# Patient Record
Sex: Male | Born: 1955
Health system: Southern US, Community
[De-identification: ages and names within clinical notes are randomized; demographics above are authoritative.]

## PROBLEM LIST (undated history)

## (undated) DIAGNOSIS — I671 Cerebral aneurysm, nonruptured: Secondary | ICD-10-CM

## (undated) DIAGNOSIS — G510 Bell's palsy: Secondary | ICD-10-CM

## (undated) DIAGNOSIS — M25519 Pain in unspecified shoulder: Secondary | ICD-10-CM

## (undated) DIAGNOSIS — I499 Cardiac arrhythmia, unspecified: Secondary | ICD-10-CM

## (undated) DIAGNOSIS — T4145XA Adverse effect of unspecified anesthetic, initial encounter: Secondary | ICD-10-CM

## (undated) DIAGNOSIS — K5900 Constipation, unspecified: Secondary | ICD-10-CM

## (undated) DIAGNOSIS — G4730 Sleep apnea, unspecified: Secondary | ICD-10-CM

## (undated) DIAGNOSIS — I10 Essential (primary) hypertension: Secondary | ICD-10-CM

## (undated) DIAGNOSIS — M1990 Unspecified osteoarthritis, unspecified site: Secondary | ICD-10-CM

## (undated) DIAGNOSIS — Z950 Presence of cardiac pacemaker: Secondary | ICD-10-CM

## (undated) DIAGNOSIS — R001 Bradycardia, unspecified: Secondary | ICD-10-CM

## (undated) DIAGNOSIS — F419 Anxiety disorder, unspecified: Secondary | ICD-10-CM

## (undated) HISTORY — PX: FRACTURE SURGERY: SHX138

## (undated) HISTORY — PX: KNEE SURGERY: SHX244

## (undated) HISTORY — DX: Essential (primary) hypertension: I10

## (undated) HISTORY — PX: OTHER SURGICAL HISTORY: SHX169

---

## 1998-06-23 ENCOUNTER — Emergency Department (HOSPITAL_COMMUNITY): Admission: EM | Admit: 1998-06-23 | Discharge: 1998-06-24 | Payer: Self-pay

## 1999-03-28 ENCOUNTER — Encounter: Payer: Self-pay | Admitting: Emergency Medicine

## 1999-03-28 ENCOUNTER — Emergency Department (HOSPITAL_COMMUNITY): Admission: EM | Admit: 1999-03-28 | Discharge: 1999-03-28 | Payer: Self-pay | Admitting: Emergency Medicine

## 1999-03-28 ENCOUNTER — Emergency Department (HOSPITAL_COMMUNITY): Admission: EM | Admit: 1999-03-28 | Discharge: 1999-03-29 | Payer: Self-pay | Admitting: Emergency Medicine

## 1999-05-06 ENCOUNTER — Encounter (HOSPITAL_COMMUNITY): Admission: RE | Admit: 1999-05-06 | Discharge: 1999-08-04 | Payer: Self-pay | Admitting: Specialist

## 1999-09-08 ENCOUNTER — Emergency Department (HOSPITAL_COMMUNITY): Admission: EM | Admit: 1999-09-08 | Discharge: 1999-09-08 | Payer: Self-pay | Admitting: Emergency Medicine

## 2000-12-27 ENCOUNTER — Emergency Department (HOSPITAL_COMMUNITY): Admission: EM | Admit: 2000-12-27 | Discharge: 2000-12-27 | Payer: Self-pay | Admitting: Emergency Medicine

## 2000-12-27 ENCOUNTER — Encounter: Payer: Self-pay | Admitting: Emergency Medicine

## 2001-05-06 ENCOUNTER — Inpatient Hospital Stay (HOSPITAL_COMMUNITY): Admission: EM | Admit: 2001-05-06 | Discharge: 2001-05-07 | Payer: Self-pay | Admitting: Emergency Medicine

## 2003-05-04 ENCOUNTER — Emergency Department (HOSPITAL_COMMUNITY): Admission: EM | Admit: 2003-05-04 | Discharge: 2003-05-04 | Payer: Self-pay | Admitting: Emergency Medicine

## 2004-03-15 ENCOUNTER — Emergency Department (HOSPITAL_COMMUNITY): Admission: EM | Admit: 2004-03-15 | Discharge: 2004-03-15 | Payer: Self-pay | Admitting: Emergency Medicine

## 2004-07-20 ENCOUNTER — Emergency Department (HOSPITAL_COMMUNITY): Admission: EM | Admit: 2004-07-20 | Discharge: 2004-07-20 | Payer: Self-pay | Admitting: Emergency Medicine

## 2004-10-08 ENCOUNTER — Ambulatory Visit: Payer: Self-pay | Admitting: Family Medicine

## 2004-10-10 ENCOUNTER — Emergency Department (HOSPITAL_COMMUNITY): Admission: EM | Admit: 2004-10-10 | Discharge: 2004-10-11 | Payer: Self-pay | Admitting: Emergency Medicine

## 2005-04-17 ENCOUNTER — Encounter: Admission: RE | Admit: 2005-04-17 | Discharge: 2005-04-17 | Payer: Self-pay | Admitting: Family Medicine

## 2005-04-18 ENCOUNTER — Encounter: Admission: RE | Admit: 2005-04-18 | Discharge: 2005-04-18 | Payer: Self-pay | Admitting: Family Medicine

## 2005-04-25 ENCOUNTER — Ambulatory Visit (HOSPITAL_COMMUNITY): Admission: RE | Admit: 2005-04-25 | Discharge: 2005-04-25 | Payer: Self-pay | Admitting: Orthopedic Surgery

## 2005-04-25 ENCOUNTER — Encounter (INDEPENDENT_AMBULATORY_CARE_PROVIDER_SITE_OTHER): Payer: Self-pay | Admitting: *Deleted

## 2005-11-13 ENCOUNTER — Ambulatory Visit: Payer: Self-pay | Admitting: Internal Medicine

## 2005-11-13 ENCOUNTER — Inpatient Hospital Stay (HOSPITAL_COMMUNITY): Admission: EM | Admit: 2005-11-13 | Discharge: 2005-11-14 | Payer: Self-pay | Admitting: Emergency Medicine

## 2005-11-20 ENCOUNTER — Ambulatory Visit: Payer: Self-pay | Admitting: Internal Medicine

## 2005-11-22 ENCOUNTER — Ambulatory Visit: Payer: Self-pay | Admitting: Internal Medicine

## 2005-12-22 ENCOUNTER — Ambulatory Visit: Payer: Self-pay | Admitting: Internal Medicine

## 2006-01-08 ENCOUNTER — Ambulatory Visit: Payer: Self-pay | Admitting: Internal Medicine

## 2006-01-30 ENCOUNTER — Ambulatory Visit: Payer: Self-pay | Admitting: Internal Medicine

## 2006-02-08 ENCOUNTER — Ambulatory Visit: Payer: Self-pay | Admitting: Cardiology

## 2006-02-08 ENCOUNTER — Ambulatory Visit: Payer: Self-pay | Admitting: Gastroenterology

## 2006-02-13 ENCOUNTER — Ambulatory Visit: Payer: Self-pay | Admitting: Gastroenterology

## 2006-03-01 ENCOUNTER — Ambulatory Visit: Payer: Self-pay | Admitting: Gastroenterology

## 2006-03-02 ENCOUNTER — Ambulatory Visit: Payer: Self-pay | Admitting: Gastroenterology

## 2006-04-23 ENCOUNTER — Ambulatory Visit: Payer: Self-pay | Admitting: Internal Medicine

## 2006-07-14 ENCOUNTER — Ambulatory Visit: Payer: Self-pay | Admitting: Internal Medicine

## 2006-07-17 ENCOUNTER — Ambulatory Visit: Payer: Self-pay | Admitting: Internal Medicine

## 2007-01-30 ENCOUNTER — Ambulatory Visit: Payer: Self-pay | Admitting: Internal Medicine

## 2007-01-30 LAB — CONVERTED CEMR LAB
ALT: 24 units/L (ref 0–40)
AST: 21 units/L (ref 0–37)
Albumin: 3.9 g/dL (ref 3.5–5.2)
Alkaline Phosphatase: 34 units/L — ABNORMAL LOW (ref 39–117)
BUN: 13 mg/dL (ref 6–23)
Basophils Absolute: 0.1 10*3/uL (ref 0.0–0.1)
Basophils Relative: 0.7 % (ref 0.0–1.0)
Bilirubin, Direct: 0.1 mg/dL (ref 0.0–0.3)
CO2: 26 meq/L (ref 19–32)
Calcium: 9.6 mg/dL (ref 8.4–10.5)
Chloride: 106 meq/L (ref 96–112)
Cholesterol: 156 mg/dL (ref 0–200)
Creatinine, Ser: 0.9 mg/dL (ref 0.4–1.5)
Eosinophils Absolute: 0.2 10*3/uL (ref 0.0–0.6)
Eosinophils Relative: 2.4 % (ref 0.0–5.0)
GFR calc Af Amer: 114 mL/min
GFR calc non Af Amer: 95 mL/min
Glucose, Bld: 87 mg/dL (ref 70–99)
HCT: 44.8 % (ref 39.0–52.0)
HDL: 34.2 mg/dL — ABNORMAL LOW (ref >39.0)
Hemoglobin: 16 g/dL (ref 13.0–17.0)
LDL Cholesterol: 106 mg/dL — ABNORMAL HIGH (ref 0–99)
Lymphocytes Relative: 35 % (ref 12.0–46.0)
MCHC: 35.8 g/dL (ref 30.0–36.0)
MCV: 89.3 fL (ref 78.0–100.0)
Monocytes Absolute: 0.5 10*3/uL (ref 0.2–0.7)
Monocytes Relative: 6.7 % (ref 3.0–11.0)
Neutro Abs: 4.2 10*3/uL (ref 1.4–7.7)
Neutrophils Relative %: 55.2 % (ref 43.0–77.0)
PSA: 0.55 ng/mL (ref 0.10–4.00)
Platelets: 319 10*3/uL (ref 150–400)
Potassium: 3.5 meq/L (ref 3.5–5.1)
RBC: 5.02 M/uL (ref 4.22–5.81)
RDW: 12.4 % (ref 11.5–14.6)
Sodium: 139 meq/L (ref 135–145)
Total Bilirubin: 0.8 mg/dL (ref 0.3–1.2)
Total CHOL/HDL Ratio: 4.6
Total Protein: 7.4 g/dL (ref 6.0–8.3)
Triglycerides: 78 mg/dL (ref 0–149)
VLDL: 16 mg/dL (ref 0–40)
WBC: 7.7 10*3/uL (ref 4.5–10.5)

## 2007-02-11 ENCOUNTER — Ambulatory Visit: Payer: Self-pay | Admitting: Gastroenterology

## 2007-02-25 ENCOUNTER — Encounter: Payer: Self-pay | Admitting: Gastroenterology

## 2007-02-25 ENCOUNTER — Ambulatory Visit: Payer: Self-pay | Admitting: Gastroenterology

## 2007-09-26 ENCOUNTER — Telehealth: Payer: Self-pay | Admitting: Internal Medicine

## 2007-12-09 ENCOUNTER — Telehealth: Payer: Self-pay | Admitting: Internal Medicine

## 2007-12-10 ENCOUNTER — Ambulatory Visit: Payer: Self-pay | Admitting: Internal Medicine

## 2007-12-10 DIAGNOSIS — I152 Hypertension secondary to endocrine disorders: Secondary | ICD-10-CM | POA: Insufficient documentation

## 2007-12-10 DIAGNOSIS — E119 Type 2 diabetes mellitus without complications: Secondary | ICD-10-CM | POA: Insufficient documentation

## 2007-12-10 DIAGNOSIS — I10 Essential (primary) hypertension: Secondary | ICD-10-CM

## 2007-12-10 HISTORY — DX: Essential (primary) hypertension: I10

## 2007-12-10 LAB — CONVERTED CEMR LAB
BUN: 12 mg/dL (ref 6–23)
Basophils Absolute: 0 10*3/uL (ref 0.0–0.1)
Basophils Relative: 0.3 % (ref 0.0–1.0)
CO2: 23 meq/L (ref 19–32)
Calcium: 10.2 mg/dL (ref 8.4–10.5)
Chloride: 104 meq/L (ref 96–112)
Creatinine, Ser: 0.9 mg/dL (ref 0.4–1.5)
Eosinophils Absolute: 0.3 10*3/uL (ref 0.0–0.6)
Eosinophils Relative: 3.2 % (ref 0.0–5.0)
GFR calc Af Amer: 114 mL/min
GFR calc non Af Amer: 94 mL/min
Glucose, Bld: 117 mg/dL — ABNORMAL HIGH (ref 70–99)
HCT: 47.8 % (ref 39.0–52.0)
Hemoglobin: 16.2 g/dL (ref 13.0–17.0)
Lymphocytes Relative: 32 % (ref 12.0–46.0)
MCHC: 33.9 g/dL (ref 30.0–36.0)
MCV: 91.7 fL (ref 78.0–100.0)
Monocytes Absolute: 0.8 10*3/uL — ABNORMAL HIGH (ref 0.2–0.7)
Monocytes Relative: 8.6 % (ref 3.0–11.0)
Neutro Abs: 5.2 10*3/uL (ref 1.4–7.7)
Neutrophils Relative %: 55.9 % (ref 43.0–77.0)
Platelets: 393 10*3/uL (ref 150–400)
Potassium: 4.5 meq/L (ref 3.5–5.1)
RBC: 5.22 M/uL (ref 4.22–5.81)
RDW: 12.7 % (ref 11.5–14.6)
Sodium: 139 meq/L (ref 135–145)
TSH: 0.98 microintl units/mL (ref 0.35–5.50)
WBC: 9.2 10*3/uL (ref 4.5–10.5)

## 2007-12-11 ENCOUNTER — Telehealth (INDEPENDENT_AMBULATORY_CARE_PROVIDER_SITE_OTHER): Payer: Self-pay | Admitting: *Deleted

## 2007-12-12 ENCOUNTER — Ambulatory Visit: Payer: Self-pay

## 2007-12-13 ENCOUNTER — Ambulatory Visit: Payer: Self-pay

## 2007-12-13 ENCOUNTER — Ambulatory Visit: Payer: Self-pay | Admitting: Cardiology

## 2007-12-13 ENCOUNTER — Encounter: Payer: Self-pay | Admitting: Internal Medicine

## 2007-12-16 ENCOUNTER — Telehealth: Payer: Self-pay | Admitting: Internal Medicine

## 2008-04-14 ENCOUNTER — Ambulatory Visit: Payer: Self-pay | Admitting: Internal Medicine

## 2008-04-16 ENCOUNTER — Telehealth: Payer: Self-pay | Admitting: Internal Medicine

## 2008-06-29 ENCOUNTER — Emergency Department (HOSPITAL_COMMUNITY): Admission: EM | Admit: 2008-06-29 | Discharge: 2008-06-29 | Payer: Self-pay | Admitting: Emergency Medicine

## 2008-11-05 ENCOUNTER — Emergency Department (HOSPITAL_COMMUNITY): Admission: EM | Admit: 2008-11-05 | Discharge: 2008-11-05 | Payer: Self-pay | Admitting: *Deleted

## 2008-11-05 LAB — CONVERTED CEMR LAB: Glucose, Bld: 342 mg/dL

## 2008-11-09 ENCOUNTER — Ambulatory Visit: Payer: Self-pay | Admitting: Internal Medicine

## 2008-11-09 DIAGNOSIS — E1165 Type 2 diabetes mellitus with hyperglycemia: Secondary | ICD-10-CM

## 2008-11-12 LAB — CONVERTED CEMR LAB
ALT: 183 units/L — ABNORMAL HIGH (ref 0–53)
AST: 160 units/L — ABNORMAL HIGH (ref 0–37)
BUN: 14 mg/dL (ref 6–23)
CO2: 24 meq/L (ref 19–32)
Calcium: 9.7 mg/dL (ref 8.4–10.5)
Chloride: 101 meq/L (ref 96–112)
Cholesterol: 186 mg/dL (ref 0–200)
Creatinine, Ser: 0.7 mg/dL (ref 0.4–1.5)
GFR calc Af Amer: 152 mL/min
GFR calc non Af Amer: 125 mL/min
Glucose, Bld: 241 mg/dL — ABNORMAL HIGH (ref 70–99)
HDL: 26 mg/dL — ABNORMAL LOW (ref >39.0)
Hgb A1c MFr Bld: 10.7 % — ABNORMAL HIGH (ref 4.6–6.0)
LDL Cholesterol: 133 mg/dL — ABNORMAL HIGH (ref 0–99)
Potassium: 3.6 meq/L (ref 3.5–5.1)
Sodium: 135 meq/L (ref 135–145)
Total CHOL/HDL Ratio: 7.2
Triglycerides: 134 mg/dL (ref 0–149)
VLDL: 27 mg/dL (ref 0–40)

## 2008-11-17 ENCOUNTER — Telehealth: Payer: Self-pay | Admitting: Internal Medicine

## 2008-12-21 ENCOUNTER — Ambulatory Visit: Payer: Self-pay | Admitting: Internal Medicine

## 2009-03-01 ENCOUNTER — Ambulatory Visit: Payer: Self-pay | Admitting: Internal Medicine

## 2009-03-03 ENCOUNTER — Telehealth: Payer: Self-pay | Admitting: Internal Medicine

## 2009-03-26 ENCOUNTER — Ambulatory Visit: Payer: Self-pay | Admitting: Internal Medicine

## 2009-03-26 LAB — CONVERTED CEMR LAB
ALT: 20 units/L (ref 0–53)
AST: 18 units/L (ref 0–37)
Albumin: 3.7 g/dL (ref 3.5–5.2)
Alkaline Phosphatase: 36 units/L — ABNORMAL LOW (ref 39–117)
BUN: 15 mg/dL (ref 6–23)
Bilirubin, Direct: 0 mg/dL (ref 0.0–0.3)
CO2: 30 meq/L (ref 19–32)
Calcium: 9.4 mg/dL (ref 8.4–10.5)
Chloride: 108 meq/L (ref 96–112)
Cholesterol: 147 mg/dL (ref 0–200)
Creatinine, Ser: 0.9 mg/dL (ref 0.4–1.5)
GFR calc non Af Amer: 93.47 mL/min (ref >60)
Glucose, Bld: 83 mg/dL (ref 70–99)
HDL: 28.6 mg/dL — ABNORMAL LOW (ref >39.00)
Hgb A1c MFr Bld: 6.2 % (ref 4.6–6.5)
LDL Cholesterol: 102 mg/dL — ABNORMAL HIGH (ref 0–99)
Potassium: 4.2 meq/L (ref 3.5–5.1)
Sodium: 143 meq/L (ref 135–145)
Total Bilirubin: 0.8 mg/dL (ref 0.3–1.2)
Total CHOL/HDL Ratio: 5
Total Protein: 7 g/dL (ref 6.0–8.3)
Triglycerides: 83 mg/dL (ref 0.0–149.0)
VLDL: 16.6 mg/dL (ref 0.0–40.0)

## 2009-04-02 ENCOUNTER — Ambulatory Visit: Payer: Self-pay | Admitting: Internal Medicine

## 2009-08-24 ENCOUNTER — Telehealth: Payer: Self-pay | Admitting: Internal Medicine

## 2009-09-06 ENCOUNTER — Telehealth: Payer: Self-pay | Admitting: Internal Medicine

## 2009-09-15 ENCOUNTER — Ambulatory Visit: Payer: Self-pay | Admitting: Internal Medicine

## 2009-09-15 LAB — CONVERTED CEMR LAB
ALT: 16 units/L (ref 0–53)
AST: 17 units/L (ref 0–37)
Albumin: 4 g/dL (ref 3.5–5.2)
Alkaline Phosphatase: 33 units/L — ABNORMAL LOW (ref 39–117)
BUN: 18 mg/dL (ref 6–23)
Bilirubin, Direct: 0 mg/dL (ref 0.0–0.3)
CO2: 30 meq/L (ref 19–32)
Calcium: 9.4 mg/dL (ref 8.4–10.5)
Chloride: 103 meq/L (ref 96–112)
Cholesterol: 156 mg/dL (ref 0–200)
Creatinine, Ser: 1.2 mg/dL (ref 0.4–1.5)
GFR calc non Af Amer: 66.95 mL/min (ref >60)
Glucose, Bld: 82 mg/dL (ref 70–99)
HDL: 25.7 mg/dL — ABNORMAL LOW (ref >39.00)
Hgb A1c MFr Bld: 6 % (ref 4.6–6.5)
LDL Cholesterol: 113 mg/dL — ABNORMAL HIGH (ref 0–99)
Potassium: 4.4 meq/L (ref 3.5–5.1)
Sodium: 142 meq/L (ref 135–145)
Total Bilirubin: 0.9 mg/dL (ref 0.3–1.2)
Total CHOL/HDL Ratio: 6
Total Protein: 7.1 g/dL (ref 6.0–8.3)
Triglycerides: 86 mg/dL (ref 0.0–149.0)
VLDL: 17.2 mg/dL (ref 0.0–40.0)

## 2009-09-24 ENCOUNTER — Ambulatory Visit: Payer: Self-pay | Admitting: Internal Medicine

## 2009-12-10 ENCOUNTER — Ambulatory Visit: Payer: Self-pay | Admitting: Internal Medicine

## 2010-03-07 ENCOUNTER — Ambulatory Visit: Payer: Self-pay | Admitting: Internal Medicine

## 2010-03-08 LAB — CONVERTED CEMR LAB
ALT: 21 units/L (ref 0–53)
AST: 23 units/L (ref 0–37)
Albumin: 4.3 g/dL (ref 3.5–5.2)
Alkaline Phosphatase: 35 units/L — ABNORMAL LOW (ref 39–117)
BUN: 17 mg/dL (ref 6–23)
Bilirubin, Direct: 0 mg/dL (ref 0.0–0.3)
CO2: 31 meq/L (ref 19–32)
Calcium: 10.2 mg/dL (ref 8.4–10.5)
Chloride: 105 meq/L (ref 96–112)
Cholesterol: 178 mg/dL (ref 0–200)
Creatinine, Ser: 1.2 mg/dL (ref 0.4–1.5)
GFR calc non Af Amer: 66.83 mL/min (ref >60)
Glucose, Bld: 96 mg/dL (ref 70–99)
HDL: 40.3 mg/dL (ref >39.00)
Hgb A1c MFr Bld: 6.2 % (ref 4.6–6.5)
LDL Cholesterol: 116 mg/dL — ABNORMAL HIGH (ref 0–99)
Potassium: 5 meq/L (ref 3.5–5.1)
Sodium: 145 meq/L (ref 135–145)
Total Bilirubin: 0.7 mg/dL (ref 0.3–1.2)
Total CHOL/HDL Ratio: 4
Total Protein: 7.8 g/dL (ref 6.0–8.3)
Triglycerides: 111 mg/dL (ref 0.0–149.0)
VLDL: 22.2 mg/dL (ref 0.0–40.0)

## 2010-03-18 ENCOUNTER — Ambulatory Visit: Payer: Self-pay | Admitting: Internal Medicine

## 2010-03-21 ENCOUNTER — Telehealth: Payer: Self-pay | Admitting: Internal Medicine

## 2010-07-15 ENCOUNTER — Emergency Department (HOSPITAL_COMMUNITY): Admission: EM | Admit: 2010-07-15 | Discharge: 2010-07-15 | Payer: Self-pay | Admitting: Emergency Medicine

## 2010-07-18 ENCOUNTER — Ambulatory Visit: Payer: Self-pay | Admitting: Internal Medicine

## 2010-09-09 ENCOUNTER — Ambulatory Visit: Payer: Self-pay | Admitting: Internal Medicine

## 2010-09-09 LAB — CONVERTED CEMR LAB
ALT: 18 units/L (ref 0–53)
AST: 18 units/L (ref 0–37)
Albumin: 3.9 g/dL (ref 3.5–5.2)
Alkaline Phosphatase: 38 units/L — ABNORMAL LOW (ref 39–117)
BUN: 15 mg/dL (ref 6–23)
Bilirubin, Direct: 0.1 mg/dL (ref 0.0–0.3)
CO2: 29 meq/L (ref 19–32)
Calcium: 9.7 mg/dL (ref 8.4–10.5)
Chloride: 109 meq/L (ref 96–112)
Cholesterol: 155 mg/dL (ref 0–200)
Creatinine, Ser: 0.9 mg/dL (ref 0.4–1.5)
GFR calc non Af Amer: 89.52 mL/min (ref >60)
Glucose, Bld: 144 mg/dL — ABNORMAL HIGH (ref 70–99)
HDL: 35.4 mg/dL — ABNORMAL LOW (ref >39.00)
Hgb A1c MFr Bld: 6.5 % (ref 4.6–6.5)
LDL Cholesterol: 108 mg/dL — ABNORMAL HIGH (ref 0–99)
Potassium: 5.3 meq/L — ABNORMAL HIGH (ref 3.5–5.1)
Sodium: 142 meq/L (ref 135–145)
Total Bilirubin: 0.6 mg/dL (ref 0.3–1.2)
Total CHOL/HDL Ratio: 4
Total Protein: 6.9 g/dL (ref 6.0–8.3)
Triglycerides: 57 mg/dL (ref 0.0–149.0)
VLDL: 11.4 mg/dL (ref 0.0–40.0)

## 2010-09-16 ENCOUNTER — Ambulatory Visit: Payer: Self-pay | Admitting: Internal Medicine

## 2010-09-16 ENCOUNTER — Telehealth: Payer: Self-pay | Admitting: Internal Medicine

## 2010-09-19 ENCOUNTER — Telehealth: Payer: Self-pay | Admitting: Internal Medicine

## 2010-10-19 ENCOUNTER — Ambulatory Visit: Payer: Self-pay | Admitting: Family Medicine

## 2010-10-19 DIAGNOSIS — J029 Acute pharyngitis, unspecified: Secondary | ICD-10-CM | POA: Insufficient documentation

## 2010-10-19 DIAGNOSIS — B9789 Other viral agents as the cause of diseases classified elsewhere: Secondary | ICD-10-CM | POA: Insufficient documentation

## 2010-10-20 ENCOUNTER — Encounter: Payer: Self-pay | Admitting: Internal Medicine

## 2010-12-04 DIAGNOSIS — T8859XA Other complications of anesthesia, initial encounter: Secondary | ICD-10-CM

## 2010-12-04 HISTORY — PX: INSERT / REPLACE / REMOVE PACEMAKER: SUR710

## 2010-12-04 HISTORY — DX: Other complications of anesthesia, initial encounter: T88.59XA

## 2011-01-05 NOTE — Assessment & Plan Note (Signed)
Summary: knot on pectoral muscle//ccm   Vital Signs:  Patient profile:   55 year old male Weight:      267 pounds BMI:     33.49 Temp:     98.6 degrees F oral Pulse rate:   72 / minute Pulse rhythm:   regular Resp:     12 per minute BP sitting:   118 / 80  (left arm) Cuff size:   regular  Vitals Entered By: Gladis Riffle, RN (March 07, 2010 9:23 AM) CC: c/o knot right chest x 1 month; has had previously but resolved and now resumed Is Patient Diabetic? Yes Did you bring your meter with you today? No   CC:  c/o knot right chest x 1 month; has had previously but resolved and now resumed.  History of Present Illness: one month ago--painful cyst right anterior chest wall now not painful but growth persists no drainage no fever  All other systems reviewed and were negative   Preventive Screening-Counseling & Management  Alcohol-Tobacco     Smoking Status: quit > 6 months     Year Quit: 2000  Current Problems (verified): 1)  Diabetes Mellitus, Type II  (ICD-250.00) 2)  Hypertension  (ICD-401.9)  Current Medications (verified): 1)  Aspir-81 81 Mg Tbec (Aspirin) .... Take 1 Tablet By Mouth Once A Day 2)  Hydrochlorothiazide 25 Mg  Tabs (Hydrochlorothiazide) .... Take 1/2 By Mouth Once Daily  Allergies: 1)  ! Losartan Potassium-Hctz (Losartan Potassium-Hctz) 2)  Lisinopril (Lisinopril)  Past History:  Past Medical History: Last updated: 11/09/2008 Hypertension Diabetes mellitus, type II  Past Surgical History: Last updated: 2007-12-20 coronary angiogram---normal by pt's report approx 2003 septal deviation  Family History: Last updated: 2007-12-20 father deceased 30 MI mother deceased 39 MI Family History of CAD Male 1st degree relative <60 Family History of CAD Male 1st degree relative <50 Family History Diabetes 1st degree relative-mother  Social History: Last updated: Dec 20, 2007 Occupation: Married Current Smoker Regular exercise-no-recently started  2008  Risk Factors: Exercise: no (12-20-07)  Risk Factors: Smoking Status: quit > 6 months (03/07/2010)  Physical Exam  General:  overweight-appearing.  repeat blood pressure of 130/88 Head:  normocephalic and atraumatic.   Neck:  No deformities, masses, or tenderness noted. Chest Wall:  no deformities and no tenderness.  1.5 cm fuctuant cys, right anterior chest wall Heart:  normal rate and regular rhythm.   Skin:  right anterior chest wall with 2 cm fluctuant mass. no drainage    Impression & Recommendations:  Problem # 1:  CELLULITIS/ABSCESS NOS (ICD-682.9) Assessment New  currently no active infection will wait for now if flares up again---I and D if sxs persist , consider excision  Complete Medication List: 1)  Aspir-81 81 Mg Tbec (Aspirin) .... Take 1 tablet by mouth once a day 2)  Hydrochlorothiazide 25 Mg Tabs (Hydrochlorothiazide) .... Take 1/2 by mouth once daily  Other Orders: Venipuncture (81191) TLB-Lipid Panel (80061-LIPID) TLB-BMP (Basic Metabolic Panel-BMET) (80048-METABOL) TLB-Hepatic/Liver Function Pnl (80076-HEPATIC) TLB-A1C / Hgb A1C (Glycohemoglobin) (83036-A1C)

## 2011-01-05 NOTE — Letter (Signed)
Summary: Out of Work  Adult nurse at [employer]  [address]   [place], [place] [address]   Phone: [phone]  Fax: [phone]    December 10, 2009   Employee:  KEIANDRE CYGAN    To Whom It May Concern:   For Medical reasons, please excuse the above named employee from work for the following dates:  Start:   12-10-2009  End:   12-13-2009  If you need additional information, please feel free to contact our office.         Sincerely,    Gordy Savers  MD

## 2011-01-05 NOTE — Assessment & Plan Note (Signed)
Summary: CONGESTION // RS   Vital Signs:  Patient profile:   55 year old male Weight:      263 pounds Temp:     98.7 degrees F oral BP sitting:   138 / 96  (left arm) Cuff size:   large  Vitals Entered By: Raechel Ache, RN (December 10, 2009 9:13 AM) CC: C/o body aches, congestion, cough x 1 week.   CC:  C/o body aches, congestion, and cough x 1 week.Marland Kitchen  History of Present Illness: 55 year old patient history mild treated hypertension, who presents with a one-week history of cough.  Cough was earlier slightly productive, but now is dry and nonproductive.  He has had no fever.  Denies any chest pain, wheezing, or shortness of breath.  Last night cough interfered  with his sleep.  Allergies: 1)  ! Losartan Potassium-Hctz (Losartan Potassium-Hctz) 2)  Lisinopril (Lisinopril)  Past History:  Past Medical History: Reviewed history from 11/09/2008 and no changes required. Hypertension Diabetes mellitus, type II  Review of Systems       The patient complains of prolonged cough.  The patient denies anorexia, fever, weight loss, weight gain, vision loss, decreased hearing, hoarseness, chest pain, syncope, dyspnea on exertion, peripheral edema, headaches, hemoptysis, abdominal pain, melena, hematochezia, severe indigestion/heartburn, hematuria, incontinence, genital sores, muscle weakness, suspicious skin lesions, transient blindness, difficulty walking, depression, unusual weight change, abnormal bleeding, enlarged lymph nodes, angioedema, breast masses, and testicular masses.    Physical Exam  General:  overweight-appearing.  repeat blood pressure of 130/88 Head:  Normocephalic and atraumatic without obvious abnormalities. No apparent alopecia or balding. Eyes:  No corneal or conjunctival inflammation noted. EOMI. Perrla. Funduscopic exam benign, without hemorrhages, exudates or papilledema. Vision grossly normal. Ears:  External ear exam shows no significant lesions or deformities.   Otoscopic examination reveals clear canals, tympanic membranes are intact bilaterally without bulging, retraction, inflammation or discharge. Hearing is grossly normal bilaterally. Mouth:  Oral mucosa and oropharynx without lesions or exudates.  Teeth in good repair. Neck:  No deformities, masses, or tenderness noted. Lungs:  Normal respiratory effort, chest expands symmetrically. Lungs are clear to auscultation, no crackles or wheezes.   Impression & Recommendations:  Problem # 1:  URI (ICD-465.9)  His updated medication list for this problem includes:    Aspir-81 81 Mg Tbec (Aspirin) .Marland Kitchen... Take 1 tablet by mouth once a day    Hydrocodone-homatropine 5-1.5 Mg/59ml Syrp (Hydrocodone-homatropine) .Marland Kitchen... 1 teaspoon every 6 hours as needed for cough    Hydrocodone-homatropine 5-1.5 Mg/64ml Syrp (Hydrocodone-homatropine) .Marland Kitchen... 1 teaspoon every 6 hours as needed for cough  Problem # 2:  HYPERTENSION (ICD-401.9)  His updated medication list for this problem includes:    Hydrochlorothiazide 25 Mg Tabs (Hydrochlorothiazide) .Marland Kitchen... Take 1/2 by mouth once daily  His updated medication list for this problem includes:    Hydrochlorothiazide 25 Mg Tabs (Hydrochlorothiazide) .Marland Kitchen... Take 1/2 by mouth once daily  Complete Medication List: 1)  Aspir-81 81 Mg Tbec (Aspirin) .... Take 1 tablet by mouth once a day 2)  Hydrochlorothiazide 25 Mg Tabs (Hydrochlorothiazide) .... Take 1/2 by mouth once daily 3)  Hydrocodone-homatropine 5-1.5 Mg/74ml Syrp (Hydrocodone-homatropine) .Marland Kitchen.. 1 teaspoon every 6 hours as needed for cough  Patient Instructions: 1)  Get plenty of rest, drink lots of clear liquids, and use Tylenol or Ibuprofen for fever and comfort. Return in 7-10 days if you're not better:sooner if you're feeling worse. Prescriptions: HYDROCODONE-HOMATROPINE 5-1.5 MG/5ML SYRP (HYDROCODONE-HOMATROPINE) 1 teaspoon every 6 hours as needed  for cough  #6 oz x 2   Entered and Authorized by:   Gordy Savers   MD   Signed by:   Gordy Savers  MD on 12/10/2009   Method used:   Print then Give to Patient   RxID:   [id number]

## 2011-01-05 NOTE — Progress Notes (Signed)
Summary: meds not helping  Phone Note Call from Patient   Caller: Patient Call For: Birdie Sons MD Summary of Call: Pt calls stating that Relcof is not helping at all.  He would like an antibiotic called to [facility]) [phone] Ext [phone] Initial call taken by: Lynann Beaver CMA,  March 21, 2010 9:29 AM  Follow-up for Phone Call        see rx Follow-up by: Birdie Sons MD,  March 21, 2010 10:59 AM    New/Updated Medications: DOXYCYCLINE HYCLATE 100 MG CAPS (DOXYCYCLINE HYCLATE) Take 1 tab twice a day Prescriptions: DOXYCYCLINE HYCLATE 100 MG CAPS (DOXYCYCLINE HYCLATE) Take 1 tab twice a day  #20 x 0   Entered and Authorized by:   Birdie Sons MD   Signed by:   Birdie Sons MD on 03/21/2010   Method used:   [facility] to        [id number]       [address]       [place], [place]  [address]       Ph: [phone] or [phone]       Fax: [phone]   RxID:   [id number]

## 2011-01-05 NOTE — Assessment & Plan Note (Signed)
Summary: ROA X 6 MTHS / RS   Vital Signs:  Patient profile:   55 year old male Weight:      271 pounds Temp:     98 degrees F oral Pulse rate:   72 / minute Pulse rhythm:   regular Resp:     12 per minute BP sitting:   116 / 68  (left arm) Cuff size:   regular  Vitals Entered By: Gladis Riffle, RN (March 18, 2010 8:05 AM) CC: 6 month rov, labs done--c/o "bronchitis" this week--CBGs 90-110 at home Is Patient Diabetic? Yes Did you bring your meter with you today? No   CC:  6 month rov and labs done--c/o "bronchitis" this week--CBGs 90-110 at home.  History of Present Illness:  Follow-Up Visit      This is a 55 year old man who presents for Follow-up visit.  The patient denies chest pain and palpitations.  Since the last visit the patient notes no new problems or concerns.  The patient reports taking meds as prescribed.  When questioned about possible medication side effects, the patient notes none.   has URI sxs for 5 days---sinus congestion and cough (some productive)  All other systems reviewed and were negative   Preventive Screening-Counseling & Management  Alcohol-Tobacco     Smoking Status: quit > 6 months     Year Quit: 2000  Current Medications (verified): 1)  Aspir-81 81 Mg Tbec (Aspirin) .... Take 1 Tablet By Mouth Once A Day 2)  Hydrochlorothiazide 25 Mg  Tabs (Hydrochlorothiazide) .... Take 1/2 By Mouth Once Daily  Allergies: 1)  ! Losartan Potassium-Hctz (Losartan Potassium-Hctz) 2)  Lisinopril (Lisinopril)  Physical Exam  General:  overweight-appearing.  repeat blood pressure of 130/88 Head:  normocephalic and atraumatic.   Eyes:  pupils equal and pupils round.   Ears:  R ear normal and L ear normal.   Nose:  no external deformity and no external erythema.   Neck:  No deformities, masses, or tenderness noted. Chest Wall:  No deformities, masses, tenderness or gynecomastia noted. Lungs:  normal respiratory effort and no accessory muscle use.   Heart:   normal rate and regular rhythm.   Abdomen:  soft and non-tender.   Msk:  normal ROM and no joint swelling.   Pulses:  R radial normal and L radial normal.   Neurologic:  cranial nerves II-XII intact and gait normal.   Skin:  turgor normal and color normal.     Impression & Recommendations:  Problem # 1:  DIABETES MELLITUS, TYPE II (ICD-250.00) improved note recent weight gain caustioned about continuedweight gain His updated medication list for this problem includes:    Aspir-81 81 Mg Tbec (Aspirin) .Marland Kitchen... Take 1 tablet by mouth once a day  Labs Reviewed: Creat: 1.2 (03/07/2010)     Last Eye Exam: normal (12/04/2008) Reviewed HgBA1c results: 6.2 (03/07/2010)  6.0 (09/15/2009)  Problem # 2:  HYPERTENSION (ICD-401.9) ok to discuss His updated medication list for this problem includes:    Hydrochlorothiazide 25 Mg Tabs (Hydrochlorothiazide) .Marland Kitchen... Take 1/2 by mouth once daily  BP today: 116/68 Prior BP: 118/80 (03/07/2010)  Labs Reviewed: K+: 5.0 (03/07/2010) Creat: : 1.2 (03/07/2010)   Chol: 178 (03/07/2010)   HDL: 40.30 (03/07/2010)   LDL: 116 (03/07/2010)   TG: 111.0 (03/07/2010)  Problem # 3:  URI (ICD-465.9) no evidence of bacterial infection. call for any concerns, increased sxs, fever, persistence of sxs, wheeze, SOB.   His updated medication list for this problem  includes:    Aspir-81 81 Mg Tbec (Aspirin) .Marland Kitchen... Take 1 tablet by mouth once a day    Relcof Pe 8-20-2.5 Mg Cr-tabs (Chlorphen-phenyleph-methscop) .Marland Kitchen... Take 1 tablet by mouth two times a day as needed cough and/or sinus congestion  Complete Medication List: 1)  Aspir-81 81 Mg Tbec (Aspirin) .... Take 1 tablet by mouth once a day 2)  Hydrochlorothiazide 25 Mg Tabs (Hydrochlorothiazide) .... Take 1/2 by mouth once daily 3)  Relcof Pe 8-20-2.5 Mg Cr-tabs (Chlorphen-phenyleph-methscop) .... Take 1 tablet by mouth two times a day as needed cough and/or sinus congestion  Patient Instructions: 1)  Please  schedule a follow-up appointment in 6 months. 2)  labs one week prior to visit 3)  lipids---272.4 4)  lfts-995.2 5)  bmet-995.2 6)  A1C-250.02 7)     Prescriptions: RELCOF PE 8-20-2.5 MG CR-TABS (CHLORPHEN-PHENYLEPH-METHSCOP) Take 1 tablet by mouth two times a day as needed cough and/or sinus congestion  #20 x 0   Entered and Authorized by:   Birdie Sons MD   Signed by:   Birdie Sons MD on 03/18/2010   Method used:   Electronically to        [id number]       [address]       [place], [place]  [address]       Ph: [phone] or [phone]       Fax: [phone]   RxID:   [id number]

## 2011-01-05 NOTE — Assessment & Plan Note (Signed)
Summary: 6 month fup//ccm   Vital Signs:  Patient profile:   55 year old male Height:      75 inches (190.50 cm) Weight:      270 pounds (122.73 kg) Temp:     98.5 degrees F (36.94 degrees C) oral Pulse rate:   66 / minute BP sitting:   140 / 90  (left arm) Cuff size:   large  Vitals Entered By: Josph Macho RMA (September 16, 2010 8:14 AM) CC: 6 month follow up/ CF   CC:  6 month follow up/ CF.  History of Present Illness:  Follow-Up Visit      This is a 55 year old man who presents for Follow-up visit.  The patient denies chest pain and palpitations.  Since the last visit the patient notes no new problems or concerns.  The patient reports taking meds as prescribed.  When questioned about possible medication side effects, the patient notes none.  Home BPs vary---he has seen as high as 200 after boss and he had an argument  All other systems reviewed and were negative   Current Medications (verified): 1)  Aspir-81 81 Mg Tbec (Aspirin) .... Take 1 Tablet By Mouth Once A Day  Allergies (verified): 1)  ! Losartan Potassium-Hctz (Losartan Potassium-Hctz) 2)  Lisinopril (Lisinopril)  Past History:  Past Medical History: Last updated: 11/09/2008 Hypertension Diabetes mellitus, type II  Past Surgical History: Last updated: 2007-12-14 coronary angiogram---normal by pt's report approx 2003 septal deviation  Family History: Last updated: Dec 14, 2007 father deceased 67 MI mother deceased 36 MI Family History of CAD Male 1st degree relative <60 Family History of CAD Male 1st degree relative <50 Family History Diabetes 1st degree relative-mother  Social History: Last updated: 2007-12-14 Occupation: Married Current Smoker Regular exercise-no-recently started 2008  Risk Factors: Exercise: no (12/14/2007)  Risk Factors: Smoking Status: quit > 6 months (07/18/2010)  Physical Exam  General:  well-developed overweight male in no acute distress. HEENT exam  atraumatic, normocephalic symmetric her muscles are intact. Neck is supple without lymphadenopathy, thyromegaly, jugular venous distention or carotid bruits. Chest clear to auscultation without increased work of breathing. Cardiac exam S1-S2 are regular. Abdominal exam  overweight extremities no clubbing cyanosis or edema. Neurologic exam he is alert  Diabetes Management Exam:    Eye Exam:       Eye Exam done elsewhere          Date: 09/03/2010          Results: normalpt's report          Done by: ophthalmology   Impression & Recommendations:  Problem # 1:  HYPERTENSION (ICD-401.9) fair control. intolerant to ACE and arts. Will improve with weight loss. His updated medication list for this problem includes:    Felodipine 5 Mg Xr24h-tab (Felodipine) ..... By mouth once daily   he talks a lot about work---he has missed work and is concerned about going back while bp is being treated  BP today: 140/90 Prior BP: 144/80 (07/18/2010)  Labs Reviewed: K+: 5.3 (09/09/2010) Creat: : 0.9 (09/09/2010)   Chol: 155 (09/09/2010)   HDL: 35.40 (09/09/2010)   LDL: 108 (09/09/2010)   TG: 57.0 (09/09/2010)  Problem # 2:  DIABETES MELLITUS, TYPE II (ICD-250.00) currently diet controlled. This tissue would completely resolve with aggressive weight loss. Discussed need for diet and exercise. His updated medication list for this problem includes:    Aspir-81 81 Mg Tbec (Aspirin) .Marland Kitchen... Take 1 tablet by mouth once a day  Labs Reviewed: Creat: 0.9 (09/09/2010)     Last Eye Exam: normal (12/04/2008) Reviewed HgBA1c results: 6.5 (09/09/2010)  6.2 (03/07/2010)  Complete Medication List: 1)  Aspir-81 81 Mg Tbec (Aspirin) .... Take 1 tablet by mouth once a day 2)  Felodipine 5 Mg Xr24h-tab (Felodipine) .... By mouth once daily  Patient Instructions: 1)  Please schedule a follow-up appointment in 4 months. 2)  labs one week prior to visit 3)  lipids---272.4 4)  lfts-995.2 5)  bmet-995.2 6)   A1C-250.02 7)     8)  It is important that you exercise regularly at least 20 minutes 5 times a week. If you develop chest pain, have severe difficulty breathing, or feel very tired , stop exercising immediately and seek medical attention. 9)  You need to lose weight. Consider a lower calorie diet and regular exercise.  Prescriptions: METOPROLOL TARTRATE 50 MG TABS (METOPROLOL TARTRATE) Take 1 tab by mouth two times per day  #60 x 3   Entered and Authorized by:   Birdie Sons MD   Signed by:   Birdie Sons MD on 09/16/2010   Method used:   Electronically to        [id number]       [address]       [place], [place]  [address]       Ph: [phone] or [phone]       Fax: [phone]   RxID:   [id number]

## 2011-01-05 NOTE — Assessment & Plan Note (Signed)
Summary: seen in the ER this weekend for elevated BP/dm   Vital Signs:  Patient profile:   55 year old male Weight:      272 pounds Temp:     99 degrees F oral Pulse rate:   60 / minute Pulse rhythm:   regular Resp:     12 per minute BP sitting:   144 / 80  (left arm) Cuff size:   regular  Vitals Entered By: Gladis Riffle, RN (July 18, 2010 10:29 AM) CC: FU ER for BP 170/88 on 07/15/10--states has been "good" at home but was 170/95 this AM-- Is Patient Diabetic? No   CC:  FU ER for BP 170/88 on 07/15/10--states has been "good" at home but was 170/95 this AM--.  History of Present Illness:  Follow-Up Visit      This is a 55 year old man who presents for Follow-up visit.  The patient denies chest pain and palpitations.  Since the last visit the patient notes a recent ED visit.  The patient reports taking meds as prescribed.  When questioned about possible medication side effects, the patient notes none.  seen in ed for aniety/htn---admits to a lot of stress.  currently no BP meds---hx of htn---had weaned off BP  All other systems reviewed and were negative   Preventive Screening-Counseling & Management  Alcohol-Tobacco     Smoking Status: quit > 6 months     Year Quit: 2000  Current Problems (verified): 1)  Uri  (ICD-465.9) 2)  Diabetes Mellitus, Type II  (ICD-250.00) 3)  Hypertension  (ICD-401.9)  Current Medications (verified): 1)  Aspir-81 81 Mg Tbec (Aspirin) .... Take 1 Tablet By Mouth Once A Day  Allergies (verified): 1)  ! Losartan Potassium-Hctz (Losartan Potassium-Hctz) 2)  Lisinopril (Lisinopril)  Past History:  Past Medical History: Last updated: 11/09/2008 Hypertension Diabetes mellitus, type II  Past Surgical History: Last updated: 12/25/07 coronary angiogram---normal by pt's report approx 2003 septal deviation  Family History: Last updated: Dec 25, 2007 father deceased 53 MI mother deceased 16 MI Family History of CAD Male 1st degree relative  <60 Family History of CAD Male 1st degree relative <50 Family History Diabetes 1st degree relative-mother  Social History: Last updated: 12/25/2007 Occupation: Married Current Smoker Regular exercise-no-recently started 2008  Risk Factors: Exercise: no (12-25-07)  Risk Factors: Smoking Status: quit > 6 months (07/18/2010)  Physical Exam  General:  alert and well-developed.   Eyes:  pupils equal and pupils round.   Ears:  R ear normal and L ear normal.   Neck:  No deformities, masses, or tenderness noted. Chest Wall:  No deformities, masses, tenderness or gynecomastia noted. Lungs:  normal respiratory effort and no accessory muscle use.   Heart:  normal rate and regular rhythm.   Msk:  No deformity or scoliosis noted of thoracic or lumbar spine.   Neurologic:  cranial nerves II-XII intact and gait normal.   Skin:  turgor normal and color normal.   Psych:  good eye contact and not anxious appearing.     Impression & Recommendations:  Problem # 1:  HYPERTENSION (ICD-401.9) unclear whether sxs related to htn or whether anxiety exacerbated htn.  bp over the weekend has been in the 120s/70s.  reviewed labs and ED record (from ED eval)  BP today: 144/80 Prior BP: 116/68 (03/18/2010)  Labs Reviewed: K+: 5.0 (03/07/2010) Creat: : 1.2 (03/07/2010)   Chol: 178 (03/07/2010)   HDL: 40.30 (03/07/2010)   LDL: 116 (03/07/2010)   TG: 111.0 (03/07/2010)  Complete Medication List: 1)  Aspir-81 81 Mg Tbec (Aspirin) .... Take 1 tablet by mouth once a day

## 2011-01-05 NOTE — Progress Notes (Signed)
Summary: low heart rate  Phone Note Call from Patient Call back at Home Phone [phone] Call back at [facility]   Summary of Call: Heart rate normally runs in low 50s.  New med, metoprolol 100 mg, says it will drop heart rate lower.  Risk?  Rx is not at drugstore.   [facility]  Allergies see chart.  Called again.  Same message. Rudy Jew, RN  September 16, 2010 1:19 PM  Initial call taken by: Rudy Jew, RN,  September 16, 2010 10:45 AM  Follow-up for Phone Call        take mes as prescribed.  call for HR <50 Follow-up by: Birdie Sons MD,  September 16, 2010 2:48 PM  Additional Follow-up for Phone Call Additional follow up Details #1::        pt informed and med called  in. Additional Follow-up by: Willy Eddy, LPN,  September 16, 2010 3:06 PM

## 2011-01-05 NOTE — Assessment & Plan Note (Signed)
Summary: not feeling well//ccm   Vital Signs:  Patient profile:   55 year old male Height:      75 inches (190.50 cm) Weight:      281.31 pounds (127.87 kg) O2 Sat:      97 % on Room air Temp:     97.7 degrees F (36.50 degrees C) oral Pulse rate:   57 / minute BP sitting:   122 / 84  (left arm) Cuff size:   large  Vitals Entered By: Josph Macho RMA (October 19, 2010 10:23 AM)  O2 Flow:  Room air CC: Body aches, throat sore X2 days/ CF Is Patient Diabetic? No   Current Medications (verified): 1)  Aspir-81 81 Mg Tbec (Aspirin) .... Take 1 Tablet By Mouth Once A Day  Allergies (verified): 1)  ! Losartan Potassium-Hctz (Losartan Potassium-Hctz) 2)  Lisinopril (Lisinopril)  Past History:  Past medical, surgical, family and social histories (including risk factors) reviewed for relevance to current acute and chronic problems.  Past Medical History: Reviewed history from 11/09/2008 and no changes required. Hypertension Diabetes mellitus, type II  Past Surgical History: Reviewed history from 12/10/2007 and no changes required. coronary angiogram---normal by pt's report approx 2003 septal deviation  Family History: Reviewed history from 12/10/2007 and no changes required. father deceased 36 MI mother deceased 52 MI Family History of CAD Male 1st degree relative <60 Family History of CAD Male 1st degree relative <50 Family History Diabetes 1st degree relative-mother  Social History: Reviewed history from 12/10/2007 and no changes required. Occupation: Married. Quit smoking--smoked 30 years. Regular exercise-no-recently started 2008  Review of Systems       see HPI  Physical Exam  General:  Gen: alert, NAD, NONTOXIC. HEENT: eyes without injection, drainage, or swelling.  Ears: EACs clear, TMs with normal light reflex and landmarks.  Nose: some dried, crusty exudate adherent to some mildly injected mucosa.  No purulent d/c.  No paranasal sinus TTP.  No  facial swelling.  Throat and mouth without focal lesion.  No pharyngial swelling, erythema, or exudate.   Neck: supple, no LAD.  LUNGS: CTA bilat, nonlabored resps.  CV: RRR, no m/r/g. Skin: no rash   Impression & Recommendations:  Problem # 1:  PHARYNGITIS (ICD-462) Viral syndrome, pretty early in the course.   Emphasized symptomatic care, self limited nature of illness.  Complete Medication List: 1)  Aspir-81 81 Mg Tbec (Aspirin) .... Take 1 tablet by mouth once a day  Other Orders: Rapid Strep (04540) T-Culture, Throat ([id number])  Patient Instructions: 1)  Please schedule a follow-up appointment as needed .    Orders Added: 1)  Rapid Strep [95621] 2)  Est. Patient Level III [30865] 3)  T-Culture, Throat [[id number]]

## 2011-01-05 NOTE — Progress Notes (Signed)
Summary: metoprolol effects  Phone Note Call from Patient Call back at Home Phone [phone]   Summary of Call: Metoprolol side effects - heart rate into 40s, 100/64 & into 90s a couple times, just checking it without any symptoms, plus no blood flow below waist, not working since Fri, still getting dizzy, metoprolol hasn't helped with that.  Other med he used to take didn't bother him like this.  He has no idea what it was.  [facility] Allergies on file.    Initial call taken by: Rudy Jew, RN,  September 19, 2010 8:07 AM  Follow-up for Phone Call        stop metoprolol start felodipine 5 mg by mouth once daily---possible side effect: lower extremity edema (would be unusual) Follow-up by: Birdie Sons MD,  September 19, 2010 10:48 AM  Additional Follow-up for Phone Call Additional follow up Details #1::        Phone Call Completed Additional Follow-up by: Rudy Jew, RN,  September 19, 2010 2:12 PM    New/Updated Medications: FELODIPINE 5 MG XR24H-TAB (FELODIPINE) by mouth once daily Prescriptions: FELODIPINE 5 MG XR24H-TAB (FELODIPINE) by mouth once daily  #30 x 5   Entered by:   Rudy Jew, RN   Authorized by:   Birdie Sons MD   Signed by:   Rudy Jew, RN on 09/19/2010   Method used:   Electronically to        [facility]       [address]       [place], [place]  [address]       Ph: [phone] or [phone]       Fax: [phone]   RxID:   [id number]

## 2011-01-06 ENCOUNTER — Other Ambulatory Visit: Payer: Self-pay | Admitting: Internal Medicine

## 2011-01-06 ENCOUNTER — Ambulatory Visit: Admit: 2011-01-06 | Payer: Self-pay | Admitting: Internal Medicine

## 2011-01-06 DIAGNOSIS — I10 Essential (primary) hypertension: Secondary | ICD-10-CM

## 2011-01-06 DIAGNOSIS — T887XXA Unspecified adverse effect of drug or medicament, initial encounter: Secondary | ICD-10-CM

## 2011-01-06 DIAGNOSIS — E119 Type 2 diabetes mellitus without complications: Secondary | ICD-10-CM

## 2011-01-06 DIAGNOSIS — E785 Hyperlipidemia, unspecified: Secondary | ICD-10-CM

## 2011-01-06 LAB — LIPID PANEL
Cholesterol: 176 mg/dL (ref 0–200)
HDL: 31.2 mg/dL — ABNORMAL LOW (ref >39.00)
Triglycerides: 118 mg/dL (ref 0.0–149.0)

## 2011-01-06 LAB — BASIC METABOLIC PANEL
BUN: 15 mg/dL (ref 6–23)
CO2: 25 mEq/L (ref 19–32)
Calcium: 9.7 mg/dL (ref 8.4–10.5)
Chloride: 108 mEq/L (ref 96–112)
Creatinine, Ser: 0.9 mg/dL (ref 0.4–1.5)
GFR: 99.19 mL/min (ref >60.00)
Glucose, Bld: 142 mg/dL — ABNORMAL HIGH (ref 70–99)
Potassium: 4.2 mEq/L (ref 3.5–5.1)
Sodium: 140 mEq/L (ref 135–145)

## 2011-01-06 LAB — HEMOGLOBIN A1C: Hgb A1c MFr Bld: 6.4 % (ref 4.6–6.5)

## 2011-01-06 LAB — HEPATIC FUNCTION PANEL
ALT: 22 U/L (ref 0–53)
AST: 22 U/L (ref 0–37)
Albumin: 4 g/dL (ref 3.5–5.2)
Bilirubin, Direct: 0.1 mg/dL (ref 0.0–0.3)
Total Bilirubin: 0.5 mg/dL (ref 0.3–1.2)
Total Protein: 7.1 g/dL (ref 6.0–8.3)

## 2011-01-13 ENCOUNTER — Encounter: Payer: Self-pay | Admitting: Internal Medicine

## 2011-01-16 ENCOUNTER — Ambulatory Visit (INDEPENDENT_AMBULATORY_CARE_PROVIDER_SITE_OTHER): Payer: Managed Care, Other (non HMO) | Admitting: Internal Medicine

## 2011-01-16 ENCOUNTER — Encounter: Payer: Self-pay | Admitting: Internal Medicine

## 2011-01-16 DIAGNOSIS — E119 Type 2 diabetes mellitus without complications: Secondary | ICD-10-CM

## 2011-01-16 DIAGNOSIS — I10 Essential (primary) hypertension: Secondary | ICD-10-CM

## 2011-01-16 NOTE — Progress Notes (Signed)
Patient comes in for followup of diabetes, hypertension. Also is here to have his lipid panel checked. He has not been monitoring his blood sugars at home but has been monitoring his blood pressures. Blood pressures ranged in the 120-140 over 70s range. He is exercising regularly. He is not following a diet. He denies any chest pain, shortness breath, PND, orthopnea, polyuria, polydipsia.   He has had his hypertension treated in the past. Note reactions to lisinopril and will start in the past.   ROS:  patient denies chest pain, shortness of breath, orthopnea. Denies lower extremity edema, abdominal pain, change in appetite, change in bowel movements. Patient denies rashes, musculoskeletal complaints. No other specific complaints in a complete review of systems.    EXAM:  well-developed well-nourished male in no acute distress. HEENT exam atraumatic, normocephalic, neck supple without jugular venous distention. Chest clear to auscultation cardiac exam S1-S2 are regular. Abdominal exam overweight with bowel sounds, soft and nontender. Extremities no edema. Neurologic exam is alert with a normal gait.

## 2011-01-16 NOTE — Assessment & Plan Note (Signed)
Patient with type 2 diabetes. I reviewed his history. No previous A1c of 10.7%. Her A1c is much improved. Reviewed with the patient. His lipid panel is not perfect for a diabetic but his A1c is okay. I've advised him to lose weight rather than add additional medications. He should follow a low-calorie diet 1208 10 her calories daily. He should continue his exercises 6 days weekly.

## 2011-01-16 NOTE — Assessment & Plan Note (Signed)
Home blood pressures range from 120-140 but average around 132/70. I have asked him to monitor his home blood pressures. I've asked him to bring his cuff in for next visit.

## 2011-02-17 LAB — CBC
HCT: 44.4 % (ref 39.0–52.0)
Hemoglobin: 15.8 g/dL (ref 13.0–17.0)
MCH: 32 pg (ref 26.0–34.0)
MCHC: 35.5 g/dL (ref 30.0–36.0)
RBC: 4.92 MIL/uL (ref 4.22–5.81)

## 2011-02-17 LAB — BASIC METABOLIC PANEL
BUN: 9 mg/dL (ref 6–23)
CO2: 24 mEq/L (ref 19–32)
Calcium: 9 mg/dL (ref 8.4–10.5)
Chloride: 110 mEq/L (ref 96–112)
Creatinine, Ser: 0.9 mg/dL (ref 0.4–1.5)
GFR calc Af Amer: >60 mL/min (ref >60)
GFR calc non Af Amer: >60 mL/min (ref >60)
Glucose, Bld: 167 mg/dL — ABNORMAL HIGH (ref 70–99)
Potassium: 3.5 mEq/L (ref 3.5–5.1)
Sodium: 138 mEq/L (ref 135–145)

## 2011-02-17 LAB — TROPONIN I: Troponin I: 0.01 ng/mL (ref 0.00–0.06)

## 2011-04-21 NOTE — Consult Note (Signed)
[place]  Patient:    Paul Paul, Paul Paul                          MRN: [id number] Proc. Date: 05/06/01 Attending:  Meade Maw, M.D. CC:         Willis Modena. Dreiling, M.D.  Jetty Duhamel, M.D.   Consultation Report  REFERRING PHYSICIAN:  Jetty Duhamel, M.D.  REASON FOR CONSULTATION:  Chest pain.  HISTORY OF PRESENT ILLNESS:  Paul Paul is a very pleasant 55 year old gentleman who presented to the emergency room with the chief complaint of left arm numbness, hand pain and chest pain.  Paul Paul relates he has had the left arm numbness and hand pain for approximately one month.  However, on the day of presentation, he noticed complaints of chest pressure which had been intermittent and at times severe.  He has had no further chest pain since the initiation of nitroglycerin and heparin.  In the emergency room he was initially treated with aspirin, nitroglycerin, heparin drip, and O2 therapy. His coronary risk factors include Paul Paul sex, strong family history.  Both parents died at age 2.  His cholesterol status is undefined, hypertension and morbid obesity.  There is no history of diabetes.  He stopped smoking approximately 12 years ago.  There have been no aggravating factors of his chest pressure and he has not noted any alleviating factor.  He notes that he is very active at work.  He picks up 50-100 pounds and tolerates this activity well.  He is short of breath at baseline which he attributes to his obesity.  PAST MEDICAL HISTORY:  Significant for: 1. Hypertension. 2. Bells palsy. 3. Obstructive sleep apnea.  CURRENT MEDICATIONS:  Atenolol 50 mg daily, Advil p.r.n. for right foot pain.  ALLERGIES:  No known drug allergies.  PAST SURGICAL HISTORY:  A questionable UPPP procedure for his obstructive sleep apnea.  FAMILY HISTORY:  Significant for mother with diabetes and passed at age 57 with myocardial infarction.  Father passed at age 51 with  myocardial infarction.  Two sisters with breast cancer.  SOCIAL HISTORY:  He is married and has one son who is alive and healthy. Works as a Location manager at [employer].  He stopped smoking in 1985 following a 20-pack-year smoking history.  No history of alcohol or drug use.  REVIEW OF SYSTEMS:  Significant rule out pain in the left foot secondary to traumatic injury.  Otherwise review of systems is negative.  PHYSICAL EXAMINATION:  Blood pressure is ranging 110-170 over a diastolic pressure of 80-102.  Heart rate is 68.  Telemetry is revealing sinus rhythm. Respiratory rate is 20.  He is afebrile.  O2 saturation is 97% on room air.  HEENT:  Unremarkable.  NECK:  Difficult to examine secondary to his grossly obese neck.  No obvious neck vein distention.  Good carotid upstrokes.  No carotid bruits.  His thyroid is not palpable.  PULMONARY EXAM:  Reveals breath sounds which were equal and clear to auscultation.  No use of accessory muscles.  CARDIOVASCULAR:  Heart sounds are distant.  There is no appreciable rub, murmurs or gallops noted.  ABDOMEN:  Morbidly obese, difficult to examine.  No obvious hepatosplenomegaly , no unusual bruits noted.  EXTREMITIES:  No clubbing, cyanosis or edema.  NEUROLOGIC:  Nonfocal.  Motor is 5/5 throughout.  SKIN:  Warm and dry.  LABORATORY DATA:  Sodium 136, potassium 4.0, creatinine 1.0.  White  count 7.4, hemoglobin 15.2.  Normal coags.  Initial set of CK is 98 with an MB of 2.1 and normal troponin.  12-lead EKG reveals a normal sinus rhythm.  There are no acute ST changes. Chest x-ray is currently pending.  IMPRESSION: 1. A 55 year old Paul Paul who presents with chest pain with typical and atypical    features.  The risks, benefits and options of proceeding with stress    Cardiolite versus coronary angiography were discussed with the patient and    it was felt that in view of his morbid obesity, stress testing will have    decreased  sensitivity and specificity.  These options were discussed with    both the patient and his family.  The patient wishes to proceed directly to    coronary angiography.  I feel that this may be appropriate in view of his    symptoms, risks factors and body habitus.  His initial set of cardiac    enzymes are negative.  There is no evidence of acute ischemia by    electrocardiogram. 2. Hypertension.  He was noted to initially have uncontrolled hypertension.    This has resolved with continuing monitoring. 3. Morbid obesity with sleep apnea.  Weight management and exercise were    discussed with the patient.  PLAN:  Further recommendations will depend on the outcome of the coronary angiography.  This has been scheduled for 05/07/01. The patient has remained pain free with initiation of treatment. DD:  05/06/01 TD:  05/06/01 Job: [id number]

## 2011-04-21 NOTE — Assessment & Plan Note (Signed)
[facility] OFFICE NOTE   NAME:Paul, Paul POPPER                        MRN:          [id number]  DATE:07/17/2006                            DOB:          07-May-1956    This 55 year old gentleman was seen today with a worsening dermatitis  involving the hands and feet.  He has remote history of a crush injury  involving his left foot.  For the past two weeks, he has had erythema with  blistering involving both hands and feet.  This is most striking over the  dorsal aspect of the left foot.  Symptoms are worsening.  He has been seen  recently and placed on Cephalexin as well as ketoconazole.  He continues to  worsen.  Exam today revealed an acute eczematous dermatitis with blistering  vesicles.  Most striking was the dorsum of the left foot that was largely  surrounded by a large patch of erythema.  The dermatitis was quite acute,  slightly painful and pruritic with oozing.   IMPRESSION:  Acute eczematous dermatitis.   DISPOSITION:  The plain Micardis will be substituted for the Micardis/HCT.  He will be placed on a prednisone Dosepak.  He will be treated b.i.d. with  wet compresses with Burrow's Solution followed by a Triamcinolone Cream.  He  will continue his oral Cephalexin.  If he is not improved in 24 hours has  been asked to recontact the office.                                   Gordy Savers, MD   PFK/MedQ  DD:  07/17/2006  DT:  07/17/2006  Job #:  [id number]

## 2011-04-21 NOTE — Discharge Summary (Signed)
NAME:  Paul Paul, Paul Paul                 ACCOUNT NO.:  [id number]   MEDICAL RECORD NO.:  [id number]          PATIENT TYPE:  INP   LOCATION:  1426                         FACILITY[facility]  [facility]   PHYSICIAN:  Rene Paci, M.D. LHCDATE OF BIRTH:  [birth year]   DATE OF ADMISSION:  11/13/2005  DATE OF DISCHARGE:  11/14/2005                                 DISCHARGE SUMMARY   DISCHARGE DIAGNOSES:  1.  Dizziness with left arm numbness, transient and resolved. Rule out      transient ischemic attack negative, rule out myocardial infarction      negative.  2.  Mild bradycardia, heart rate in the 50s likely due to atenolol. Decrease      dose from 50 to 25 mg daily with outpatient followup.  3.  Obstructive sleep apnea on CPAP q.h.s.  4.  Hyperglycemia with A1c of 6.2 likely early diabetes. Recommend diet      modification and outpatient followup.  5.  Obesity.  6.  Hypertension.   DISCHARGE MEDICATIONS:  1.  Aspirin 81 mg daily.  2.  Atenolol 25 mg daily.  3.  May continue as needed over the counter Claritin and Mucinex.   DISPOSITION:  The patient is discharged home medically stable.   CONDITION:  Asymptomatic with no recurrent dizziness or numbness since  admission. He is instructed to followup with his primary MD, Dr. Cato Mulligan, for  Monday, December 18 at 3:15 p.m. at which time I will review further need  for cardiac stress testing and/or management of hypertension without  bradycardia as well as monitoring for development of diabetes.   PROCEDURE:  MRI and MRA of his head showing low lying cerebellar tonsils 6-7  mm below the foramen magnum, question mild intracranial hypertension versus  mild Chiari I, also persistent fetal anatomy of the left PCA with motion  artifact likely but unable to exclude with certainty an aneurysm. Consider  outpatient CT angio or conventional angio to confirm.   HOSPITAL COURSE BY PROBLEM:  TRANSIENT DIZZINESS WITH LEFT SIDED NUMBNESS.  The patient is a  pleasant 55 year old gentleman with multiple risk factors  for coronary disease as well as stroke who presented to the emergency room  at his wife's urging after an episode of transient dizziness associated with  left sided numbness, no chest pain, or shortness of breath. Please H&P for  full details. He was admitted for rule out TIA with MRI/MRA as well as  cardiac evaluation on telemetry and cardiac enzymes. Telemetry showed  persisting heart rate in the 50s throughout his overnight observation and  atenolol was reduced from 50 to 25 mg daily. This left his blood pressure  suboptimally controlled with systolic from 130s to 160s but improved his  heart rate to near 60. Bradycardia due to atenolol may have been the cause  of his symptomatology as no other abnormalities could be identified cardiac  or acute on the MRI/MRA. MRI/MRA results are as listed above and while not  entirely normal unlikely to  be cause for the patient's symptoms. Further outpatient workup per primary  MD as felt necessary.  The patient is felt stable for discharge home with  continued management and observation of his symptoms. Please see further  details below for other information regarding this hospitalization.      Rene Paci, M.D. [facility]  Electronically Signed     VL/MEDQ  D:  11/14/2005  T:  11/15/2005  Job:  [id number]

## 2011-04-21 NOTE — Op Note (Signed)
NAME:  Paul Paul, Paul Paul                 ACCOUNT NO.:  [id number]   MEDICAL RECORD NO.:  [id number]          PATIENT TYPE:  OIB   LOCATION:  2899                         FACILITY:  [facility]   PHYSICIAN:  Burnard Bunting, M.D.    DATE OF BIRTH:  [birth year]   DATE OF PROCEDURE:  04/25/2005  DATE OF DISCHARGE:  04/25/2005                                 OPERATIVE REPORT   PREOPERATIVE DIAGNOSIS:  Left knee meniscal cyst, lateral meniscal tear and  chondromalacia of lateral femoral condyle.   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   PROCEDURE:  Left knee diagnostic operative arthroscopy with microfracture of  lateral femoral condyle, chondroplasty of the trochlea, partial lateral  meniscectomy and cyst excision from the anterior compartment.   SURGEON ATTENDING:  Burnard Bunting, M.D.   ASSISTANT:  None.   ANESTHESIA:  General endotracheal.   ESTIMATED BLOOD LOSS:  Minimal.   OPERATIVE FINDINGS:  1.  On examination under anesthesia, range of motion 0-130 degrees of      flexion with stability to varus and valgus stress at 0 and 30 degree,      ACL and PCL intact, no posterolateral rotatory instability was noted.  2.  Diagnostic operative arthroscopy:      1.  Mild chondromalacia within the trochlea, but not on the undersurface          of the patella, and there were no loose bodies in medial or lateral          gutters.  3.  Intact medial compartment, but only mild sporadic grade 1 chondromalacia      on the medial femoral condyle and tibia.  4.  Cyst at the base of the ACL.  5.  Chondral defect of the lateral femoral condyle measuring 1 x 1 cm with      lateral meniscal tear.   OPERATIVE PROCEDURE IN DETAIL:  The patient was brought to the operating  room where general endotracheal anesthesia was induced.  IV antibiotics were  administered.  The left leg was then prepped with DuraPrep solution  and  draped in a sterile manner.  The anatomy of the knee was identified  including the medial and lateral margins of the patellar tendons as well as  medial and lateral margins of the patella.  Anterior inferolateral portal  was established.  Anterior inferomedial portal was then established under  direct visualization.  Mild chondromalacia was seen within the trochlea,  which was debrided back with a shaver to a stable rim.  Medial compartment  was generally intact with the exception of sporadic grade 1 chondromalacia.  There were no meniscal tears on the medial side.  ACL and PCL were intact.  There was a cyst at the base of the ACL stump.  This mass was removed and  sent to pathology for analysis.  Following removal, microfracture and  chondroplasty were performed on the 1 x 1-cm full-thickness chondral defect  in the lateral femoral condyle.  Partial lateral meniscectomy was also  required on the anterior horn for an unstable flap tear involving __________  the anterior and posterior width of the  meniscus.  Following these resections, the knee was thoroughly irrigated and  instruments removed from the portals, which were then closed using 3-0 nylon  suture.  A bulky dressing was placed.  The patient tolerated the procedure  well without any complications.       GSD/MEDQ  D:  06/01/2005  T:  06/01/2005  Job:  [id number]

## 2011-04-21 NOTE — Assessment & Plan Note (Signed)
[facility]                                   ON-CALL NOTE   NAME:ROGERSFerguson, Gertner                        MRN:          [id number]  DATE:07/14/2006                            DOB:          Nov 06, 1956    Phone number [phone].  Patient of Dr. Cato Mulligan.  The phone call was at 9:30  a.m. on August 11.   Mr. Arreola has had some swelling of his hands and some pus coming out of his  foot, and he thinks he needs evaluation.  He has not had fever, not feeling  terrible, but knows he needs an evaluation.   PLAN:  I will see him at 11:45 this morning.                                   Karie Schwalbe, MD   RIL/MedQ  DD:  07/14/2006  DT:  07/15/2006  Job #:  [id number]   cc:   Valetta Mole. Swords, MD

## 2011-04-21 NOTE — Cardiovascular Report (Signed)
[place]  Patient:    Paul Paul, Paul Paul                        MRN: [id number] Proc. Date: 05/07/01 Adm. Date:  [id number] Attending:  Jetty Duhamel T CC:         Willis Modena. Dreiling, M.D.   Cardiac Catheterization  REFERRING PHYSICIAN:  Willis Modena. Dreiling, M.D.  INDICATIONS FOR PROCEDURE:  Ongoing chest pain and arm pain in a 55 year old, morbidly obese male with significant risk factors including hypertension, dyslipidemia, male sex.  Risks, benefits, and options were explained to the patient.  The patient wishes to proceed directly to heart catheterization.  DESCRIPTION OF PROCEDURE:  After obtaining written informed consent, the patient was brought to the cardiac catheterization lab in the postabsorptive state.  Preoperative sedation was achieved with IV Versed.  The right groin was prepped and draped in the usual sterile fashion.  Local anesthesia was achieved using 1% Xylocaine.  A 6 French hemostasis sheath was placed into the right femoral artery using a modified Seldinger technique.  Selective coronary angiography was performed using a JL4, JR4 Judkins catheter.  Nonionic contrast was used.  All catheter exchange were made over a guide wire. Following the procedure, the films were reviewed with Dr. Garnette Scheuermann.  It was felt that the diagonal disease was not amenable to intervention.  The patient was transferred to the holding area.  The hemostasis sheath was removed. Hemostasis was achieved using digital pressure.  FINDINGS:  The aortic pressure was 129/79, LV pressure was 128/19.  Single plane ventriculogram revealed normal wall motion.  There was no mitral regurgitation noted.  CORONARY ANGIOGRAPHY:  Left main coronary artery:  The left main coronary artery bifurcated into the left anterior descending and circumflex vessel. There was no significant disease in the left main coronary artery.  Left anterior descending:  The left anterior  descending gave rise to a large diagonal #1, large diagonal #2 and went on to end as an apical recurrent branch.  There was an ostial 60-70% lesion in the first diagonal.  Circumflex vessel:  The circumflex vessel was a moderate sized vessel.  There was a 30% ostial stenosis noted in the ostium of the circumflex.  The circumflex gave rise to a small OM-1, moderate OM-2 and went on to end as an AV groove vessel.  Right coronary artery:  The right coronary artery was a large dominant artery. It gave rise to a PDA and large PL branch.  IMPRESSION: 1. Borderline disease in the first diagonal, otherwise, normal coronaries. 2. Normal left ventricular function.  Ejection fraction of 65%.  RECOMMENDATIONS:  Secondary prevention with LDL goal less than 100. Management of hypertension and morbid obesity.  Should the patient have ongoing chest pain, will consider a stress Cardiolite for evaluation of ischemia in the second diagonal. DD:  05/06/01 TD:  05/07/01 Job: [id number]

## 2011-04-21 NOTE — H&P (Signed)
NAME:  Paul Paul, Paul Paul NO.:  [id number]   MEDICAL RECORD NO.:  [id number]          PATIENT TYPE:  EMS   LOCATION:  ED                           FACILITY:  [facility]   PHYSICIAN:  Thomos Lemons, D.O. [facility]   DATE OF BIRTH:  Sep 16, 1956   DATE OF ADMISSION:  11/13/2005  DATE OF DISCHARGE:                                HISTORY & PHYSICAL   CHIEF COMPLAINT:  Dizziness with left-sided weakness/numbness.   HISTORY OF PRESENT ILLNESS:  Patient is a 55 year old white male with a past  medical history of hypertension and obstructive sleep apnea who presents  with acute dizziness and left-sided numbness/tingling.  Patient states that  he suddenly felt like he was going to pass out at around noon today.  The  patient experienced some transient left-sided numbness, predominantly in the  left arm.  He denies any associated dysarthria.  Patient has chronic facial  droop from previous history of Bell's palsy.  There is no persistent  weakness.  There was mild blurry vision.  Patient denies chest pain and  shortness of breath.   REVIEW OF SYSTEMS:  Patient has had a recent head cold over the last 3-4  days.  He took over-the-counter Claritin and Mucinex DM.  The patient denies  any GI symptoms.  No nausea, vomiting, constipation, diarrhea.  No dark  stools or blood in the stools.  The patient does not have any history of  diabetes, although there is a strong family history.  All other systems are  negative.   PAST MEDICAL HISTORY:  1.  Hypertension, suboptimal.  2.  Obstructive sleep apnea, with CPAP overnight.  3.  History of Bell's palsy with right-sided facial droop.  4.  Status post cardiac cath in 2002 for evaluation of chest pain with the      following results:  Borderline left diagonal, otherwise normal      coronaries.  Normal EF and left ventricular function.   PAST SURGICAL HISTORY:  Status post left knee arthroscopy for meniscal tear  and chondromalacia.   SOCIAL  HISTORY:  Patient is married.  Has one son.  Works as a Haematologist.   FAMILY HISTORY:  Mother deceased at age 36 secondary to diabetes and MI.  Father also deceased at age 49 secondary to MI.  Patient has two sisters  with history of breast CA.   HABITS:  Patient quit tobacco in 1985.  Previously a 28-pack-year history.  No alcohol.   CURRENT MEDICATIONS:  1.  Atenolol 50 mg once daily.  2.  Over-the-counter Claritin.  3.  Mucinex DM.   ALLERGIES TO MEDICATIONS:  None known.   LABORATORY DATA:  Sodium 131, potassium 3.8, chloride 108, CO2 21, BUN 11,  creatinine 1.2.  Random blood sugar 193.  Calcium was down from 8.4.  CBC  showed WBCs 9.4, H&H 16.3 and 46.1, and platelets 299.  Hematocrit was 46.1.   EKG in the ER showed normal sinus rhythm, bradycardic at 52 beats per  minute.  No acute ST/T changes were noted.   CT of the head  in the ER revealed no acute intracranial findings.   PHYSICAL EXAMINATION:  VITAL SIGNS:  Temperature 96.7, blood pressure  167/92, pulse 60, respirations 23, O2 sat 98% on room air.  GENERAL:  The patient is a pleasant, morbidly obese 55 year old white male  in no apparent distress.  HEENT:  Normocephalic and atraumatic.  Pupils are equal and reactive to  light bilaterally.  Extraocular motility is intact.  Patient is anicteric.  Normal conjunctivae.  Oropharyngeal exam was unremarkable besides crowded  upper airway.  Possible acanthosis nigricans in the posterior aspect of his  neck.  NECK:  Supple.  No adenopathy.  No carotid bruit.  No thyromegaly.  RESPIRATORY:  Normal respiratory effort.  Chest was clear to auscultation  bilaterally.  No rales, rhonchi or wheezes.  CARDIOVASCULAR:  Heart sounds somewhat distant but regular.  No significant  murmurs, rubs or gallops appreciated.  ABDOMEN:  Protuberant and nontender.  Positive bowel sounds.  No  organomegaly.  EXTREMITIES:  No clubbing, cyanosis or edema.  NEUROLOGIC:  Cranial nerves II-XII  are grossly intact.  Patient had mild  chronic right facial droop from previous history of Bell's palsy.  Muscle  strength was 5/5 throughout.  Patient had no cerebellar signs.  Reflexes are  equal and symmetric bilaterally.   IMPRESSION:  1.  Acute dizziness with left-sided numbness, rule out transient ischemic      attack.  2.  Hypertension, suboptimal control.  3.  History of Bell's palsy.  4.  History of obstructive sleep apnea.  5.  Hyperglycemia, possible type 2 diabetes.   RECOMMENDATIONS:  Patient will be admitted overnight for monitoring.  We  will perform serial cardiac enzymes and check an MRI/MRA of the head and  neck.  Due to his occupational history of being a Haematologist, we will  obtain x-ray of his orbits to rule out metal fragments.  Patient will be  placed on aspirin 325 mg once daily.  We will check his hemoglobin A1C and  fasting lipid profile and recheck his EKG in the a.m.  Due to his relative  bradycardia, we will decrease his atenolol to 25 mg, monitor his blood  pressures while in the hospital.  Patient will likely need a second agent to  control his BP.  The patient's wife did bring CPAP settings from home.      Thomos Lemons, D.O. [facility]  Electronically Signed    RY/MEDQ  D:  11/13/2005  T:  11/13/2005  Job:  [id number]   cc:   Valetta Mole. Swords, M.D. [facility]  [address] [place]  [place] [address]

## 2011-05-31 ENCOUNTER — Encounter: Payer: Self-pay | Admitting: Internal Medicine

## 2011-05-31 ENCOUNTER — Ambulatory Visit (INDEPENDENT_AMBULATORY_CARE_PROVIDER_SITE_OTHER): Payer: Managed Care, Other (non HMO) | Admitting: Internal Medicine

## 2011-05-31 VITALS — BP 142/90 | Temp 98.8°F | Ht 75.0 in | Wt 281.0 lb

## 2011-05-31 DIAGNOSIS — L03313 Cellulitis of chest wall: Secondary | ICD-10-CM

## 2011-05-31 DIAGNOSIS — L02219 Cutaneous abscess of trunk, unspecified: Secondary | ICD-10-CM

## 2011-05-31 MED ORDER — DOXYCYCLINE HYCLATE 100 MG PO TABS: 100.0000 mg | ORAL_TABLET | Freq: Two times a day (BID) | ORAL | Status: AC

## 2011-05-31 NOTE — Progress Notes (Signed)
  Subjective:    Patient ID: Paul Paul, male    DOB: September 29, 1956, 55 y.o.   MRN: [id number]  HPI  2 masses on chest Have grown rapidly Have been tender-not now  No fevers or chills  Past Medical History  Diagnosis Date  . HYPERTENSION 12/10/2007  . DIABETES MELLITUS, TYPE II 11/09/2008   Past Surgical History  Procedure Date  . Cardiac catheterization     normal  . Septal deviation     reports that he quit smoking about 30 years ago. He does not have any smokeless tobacco history on file. His alcohol and drug histories not on file. family history includes Diabetes in his mother; Heart attack in his father and mother; and Heart disease in his father and mother. Allergies  Allergen Reactions  . Lisinopril     REACTION: rash  . Losartan Potassium-Hctz     REACTION: dizziness, fatigue     Review of Systems  patient denies chest pain, shortness of breath, orthopnea. Denies lower extremity edema, abdominal pain, change in appetite, change in bowel movements. Patient denies rashes, musculoskeletal complaints. No other specific complaints in a complete review of systems.      Objective:   Physical Exam Two fluctuant masses right chest wall. Non tender       Assessment & Plan:  Chest abscess/cellulitis ABX Refer surgery for definitive treatment

## 2011-06-01 ENCOUNTER — Other Ambulatory Visit: Payer: Self-pay | Admitting: Internal Medicine

## 2011-06-01 DIAGNOSIS — J869 Pyothorax without fistula: Secondary | ICD-10-CM

## 2011-06-04 DIAGNOSIS — I671 Cerebral aneurysm, nonruptured: Secondary | ICD-10-CM

## 2011-06-04 HISTORY — PX: CEREBRAL ANEURYSM REPAIR: SHX164

## 2011-06-04 HISTORY — DX: Cerebral aneurysm, nonruptured: I67.1

## 2011-06-05 ENCOUNTER — Encounter (INDEPENDENT_AMBULATORY_CARE_PROVIDER_SITE_OTHER): Payer: Self-pay | Admitting: Surgery

## 2011-06-05 ENCOUNTER — Ambulatory Visit (INDEPENDENT_AMBULATORY_CARE_PROVIDER_SITE_OTHER): Payer: Managed Care, Other (non HMO) | Admitting: Surgery

## 2011-06-05 VITALS — BP 176/100 | HR 70 | Temp 97.0°F | Ht 75.0 in | Wt 278.6 lb

## 2011-06-05 NOTE — Patient Instructions (Signed)
Removed the packing from incisions in 48 hours. Cover incisions with clean Band-Aid  After packing removed.Armed forces operational officer tomorrow. Complete antibiotics. Return to clinic next week for recheck.

## 2011-06-05 NOTE — Progress Notes (Signed)
Subjective:     Patient ID: Paul Paul, male   DOB: [birth year], 55 y.o.   MRN: [id number]    BP 176/100  Pulse 70  Temp 97 F (36.1 C)  Ht 6\' 3"  (1.905 m)  Wt 278 lb 9.6 oz (126.372 kg)  BMI 34.82 kg/m2    HPI The patient presents with 2 red and swollen masses over his right chest region. He had one area initially that was red swollen and painful. The second area then appeared adjacent to the first area. He has had a mass there in the past. It would swell and then resolve on its own. The area has not been draining. It is red, tender and hot to touch. The pain is probably a 5/10. It is located over the 2 red swollen mass overlying his right chest wall. He is sent today at the request of Dr. Cato Mulligan. He denies any fever or chills currently.  Review of Systems  Constitutional: Negative for fever and chills.  HENT: Negative.   Eyes: Negative.   Respiratory: Negative.   Gastrointestinal: Negative.   Genitourinary: Negative.   Musculoskeletal: Negative.   Skin: Positive for wound.  Neurological: Negative.   Hematological: Negative.   Psychiatric/Behavioral: Negative.        Objective:   Physical Exam  Constitutional: He appears well-developed and well-nourished.  HENT:  Head: Normocephalic and atraumatic.  Eyes: Conjunctivae and EOM are normal. Pupils are equal, round, and reactive to light.  Neck: Normal range of motion. Neck supple.  Skin: Skin is warm and dry. There is erythema.          Two 2 cm red fluctuant tender masses.       Assessment:  Carbuncle right chest times 2.  Plan:  Incision and drainage in the office. The patient had an infected epidermal inclusion cyst. I discussed drainage of these today in the office with him. I also explained that these will need to be excised excised and each are not infected. Risk of bleeding infection or associated with incision and drainage. He agrees to proceed. Under sterile conditions the right upper chest was prepped and  draped in sterile fashion. 1% lidocaine with epinephrine was used for local anesthesia. Both areas were injected. Scalpel was used to puncture both abscess. Cultures were taken. Quarter-inch iodoform packing was placed in both wounds. He tolerated the procedure well. He will return in one week for a wound recheck. He will remove his packing in 48 hours. He will complete his doxycycline. He will shower tomorrow.

## 2011-06-06 ENCOUNTER — Other Ambulatory Visit (INDEPENDENT_AMBULATORY_CARE_PROVIDER_SITE_OTHER): Payer: Self-pay | Admitting: Surgery

## 2011-06-09 LAB — CULTURE, ROUTINE-ABSCESS: Gram Stain: NONE SEEN

## 2011-06-14 ENCOUNTER — Encounter (INDEPENDENT_AMBULATORY_CARE_PROVIDER_SITE_OTHER): Payer: Self-pay | Admitting: Surgery

## 2011-06-14 ENCOUNTER — Ambulatory Visit (INDEPENDENT_AMBULATORY_CARE_PROVIDER_SITE_OTHER): Payer: Managed Care, Other (non HMO) | Admitting: Surgery

## 2011-06-14 DIAGNOSIS — L0293 Carbuncle, unspecified: Secondary | ICD-10-CM

## 2011-06-14 NOTE — Progress Notes (Signed)
Subjective:     Patient ID: Paul Paul, male   DOB: 1956/04/21, 55 y.o.   MRN: [id number]    Temp(Src) 97 F (36.1 C) (Temporal)  Wt 277 lb 12.8 oz (126.009 kg)    HPI  The patient returns to clinic he had 2 carbuncles that were incised and drained. His right upper chest. They are improved. No further redness or drainage.   Review of Systems negative     Objective:   Physical Exam  The skin to his right upper chest shows 2 small wound that are healing well there is no redness, clots, or fluctuance.     Assessment:     Recurrent right chest wall abscess     Plan:     He will return in 2-3 weeks for recheck here these areas would need to be excised as an outpatient to prevent recurrent. These are probably underlying epidermal inclusion cyst that got infected.

## 2011-06-14 NOTE — Patient Instructions (Signed)
RETURN IN 3-4 WEEKS.

## 2011-07-04 ENCOUNTER — Inpatient Hospital Stay (HOSPITAL_COMMUNITY)
Admission: EM | Admit: 2011-07-04 | Discharge: 2011-07-06 | DRG: 244 | Disposition: A | Discharge status: Home / Self Care | Payer: Managed Care, Other (non HMO) | Source: Ambulatory Visit | Attending: Internal Medicine | Admitting: Internal Medicine

## 2011-07-04 DIAGNOSIS — E785 Hyperlipidemia, unspecified: Secondary | ICD-10-CM | POA: Diagnosis present

## 2011-07-04 DIAGNOSIS — E119 Type 2 diabetes mellitus without complications: Secondary | ICD-10-CM | POA: Diagnosis present

## 2011-07-04 DIAGNOSIS — R55 Syncope and collapse: Secondary | ICD-10-CM

## 2011-07-04 DIAGNOSIS — I2510 Atherosclerotic heart disease of native coronary artery without angina pectoris: Secondary | ICD-10-CM | POA: Diagnosis present

## 2011-07-04 DIAGNOSIS — G4733 Obstructive sleep apnea (adult) (pediatric): Secondary | ICD-10-CM | POA: Diagnosis present

## 2011-07-04 DIAGNOSIS — Z7982 Long term (current) use of aspirin: Secondary | ICD-10-CM

## 2011-07-04 DIAGNOSIS — I495 Sick sinus syndrome: Principal | ICD-10-CM | POA: Diagnosis present

## 2011-07-04 DIAGNOSIS — I10 Essential (primary) hypertension: Secondary | ICD-10-CM | POA: Diagnosis present

## 2011-07-04 LAB — COMPREHENSIVE METABOLIC PANEL
ALT: 18 U/L (ref 0–53)
AST: 18 U/L (ref 0–37)
Albumin: 4 g/dL (ref 3.5–5.2)
Alkaline Phosphatase: 40 U/L (ref 39–117)
BUN: 12 mg/dL (ref 6–23)
Chloride: 104 mEq/L (ref 96–112)
Potassium: 4.2 mEq/L (ref 3.5–5.1)
Sodium: 139 mEq/L (ref 135–145)
Total Bilirubin: 0.5 mg/dL (ref 0.3–1.2)
Total Protein: 7.6 g/dL (ref 6.0–8.3)

## 2011-07-04 LAB — URINALYSIS, ROUTINE W REFLEX MICROSCOPIC
Bilirubin Urine: NEGATIVE
Glucose, UA: NEGATIVE mg/dL
Hgb urine dipstick: NEGATIVE
Ketones, ur: NEGATIVE mg/dL
Nitrite: NEGATIVE
Specific Gravity, Urine: 1.018 (ref 1.005–1.030)
pH: 6 (ref 5.0–8.0)

## 2011-07-04 LAB — BASIC METABOLIC PANEL
BUN: 12 mg/dL (ref 6–23)
Chloride: 106 mEq/L (ref 96–112)
Creatinine, Ser: 0.93 mg/dL (ref 0.50–1.35)
GFR calc Af Amer: >60 mL/min (ref >60)
Glucose, Bld: 126 mg/dL — ABNORMAL HIGH (ref 70–99)
Potassium: 3.8 mEq/L (ref 3.5–5.1)

## 2011-07-04 LAB — CBC
HCT: 44.3 % (ref 39.0–52.0)
MCHC: 35 g/dL (ref 30.0–36.0)
MCV: 89.3 fL (ref 78.0–100.0)
Platelets: 229 10*3/uL (ref 150–400)
RDW: 12.8 % (ref 11.5–15.5)
WBC: 7 10*3/uL (ref 4.0–10.5)

## 2011-07-04 LAB — LIPID PANEL
Cholesterol: 154 mg/dL (ref 0–200)
HDL: 30 mg/dL — ABNORMAL LOW (ref >39)
LDL Cholesterol: 95 mg/dL (ref 0–99)
Triglycerides: 145 mg/dL (ref <150)
VLDL: 29 mg/dL (ref 0–40)

## 2011-07-04 LAB — PROTIME-INR
INR: 1.07 (ref 0.00–1.49)
Prothrombin Time: 14.1 seconds (ref 11.6–15.2)

## 2011-07-04 LAB — CK TOTAL AND CKMB (NOT AT ARMC): Relative Index: 2.4 (ref 0.0–2.5)

## 2011-07-04 LAB — DIFFERENTIAL
Basophils Absolute: 0.1 10*3/uL (ref 0.0–0.1)
Eosinophils Absolute: 0.2 10*3/uL (ref 0.0–0.7)
Eosinophils Relative: 3 % (ref 0–5)
Lymphs Abs: 1.9 10*3/uL (ref 0.7–4.0)
Monocytes Absolute: 0.6 10*3/uL (ref 0.1–1.0)

## 2011-07-04 LAB — MAGNESIUM: Magnesium: 2.2 mg/dL (ref 1.5–2.5)

## 2011-07-04 LAB — CARDIAC PANEL(CRET KIN+CKTOT+MB+TROPI)
CK, MB: 1.9 ng/mL (ref 0.3–4.0)
Troponin I: <0.3 ng/mL (ref <0.30)

## 2011-07-04 LAB — URINE MICROSCOPIC-ADD ON

## 2011-07-05 DIAGNOSIS — I498 Other specified cardiac arrhythmias: Secondary | ICD-10-CM

## 2011-07-05 DIAGNOSIS — I2510 Atherosclerotic heart disease of native coronary artery without angina pectoris: Secondary | ICD-10-CM

## 2011-07-05 DIAGNOSIS — R55 Syncope and collapse: Secondary | ICD-10-CM

## 2011-07-05 LAB — CARDIAC PANEL(CRET KIN+CKTOT+MB+TROPI)
Relative Index: INVALID (ref 0.0–2.5)
Relative Index: INVALID (ref 0.0–2.5)
Total CK: 83 U/L (ref 7–232)
Troponin I: <0.3 ng/mL (ref <0.30)

## 2011-07-05 LAB — CBC
Hemoglobin: 15.1 g/dL (ref 13.0–17.0)
MCV: 89.6 fL (ref 78.0–100.0)
Platelets: 277 10*3/uL (ref 150–400)
RBC: 4.82 MIL/uL (ref 4.22–5.81)
WBC: 8.7 10*3/uL (ref 4.0–10.5)

## 2011-07-05 LAB — FERRITIN: Ferritin: 143 ng/mL (ref 22–322)

## 2011-07-06 ENCOUNTER — Inpatient Hospital Stay (HOSPITAL_COMMUNITY): Payer: Managed Care, Other (non HMO)

## 2011-07-06 ENCOUNTER — Telehealth: Payer: Self-pay | Admitting: Internal Medicine

## 2011-07-06 HISTORY — PX: CARDIAC CATHETERIZATION: SHX172

## 2011-07-06 NOTE — Telephone Encounter (Signed)
Had a device put in yesterday and released today.  He has a question regarding discharge medications.

## 2011-07-06 NOTE — Cardiovascular Report (Signed)
NAME:  Paul Paul, Paul Paul NO.:  [id number]  MEDICAL RECORD NO.:  [id number]  LOCATION:  2918                         FACILITY:  [facility]  PHYSICIAN:  Lorine Bears, MD     DATE OF BIRTH:  Jan 04, 1956  DATE OF PROCEDURE: DATE OF DISCHARGE:                           CARDIAC CATHETERIZATION   REFERRING PHYSICIAN:  Duke Salvia, MD, [facility]  PROCEDURES PERFORMED: 1. Left heart catheterization 2. Coronary angiography. 3. Left ventricular angiography.  INDICATIONS AND CLINICAL HISTORY:  This is a 55 year old gentleman with past medical history of hypertension and hyperlipidemia as well as diabetes.  He presented with syncope and was noted to have severe bradycardia.  The plan is to implant a permanent pacemaker.  However, cardiac catheterization was recommended to rule out ischemic etiology for his bradycardia.  ACCESS:  Right radial artery.  STUDY DETAILS:  A standard informed consent was obtained.  The right radial artery was prepped in a sterile fashion.  A 5-French sheath was placed in the right radial artery after anterior puncture.  Coronary angiography was performed with a Jackie catheter.  Left ventricular angiography was performed with a pigtail catheter.  After placing the sheath, he was given 5000 units of unfractionated heparin.  A 3 mg of verapamil was given through the arterial sheath.  The sheath was removed at the end of the case and a TR band was applied.  There were no immediate complications.  STUDY FINDINGS:  Hemodynamic findings:  Left ventricular pressure is 118/6 with left ventricular end-diastolic pressure of 13 mmHg.  Aortic pressure is 119/65 with a mean pressure of 88 mmHg.  Left ventricular angiography:  This showed normal LV systolic function with an estimated ejection fraction of 60%.  CORONARY ANGIOGRAPHY: 1. Left main coronary artery:  The vessel is very short and overall     free of significant disease. 2. Left circumflex  artery:  The vessel is overall medium in size and     comes at the very steep angulation from the left main coronary     artery, and it has minor irregularities without any obstructive     disease.  OM-1 is very small in size.  OM-2 is medium in size and     OM 3 is very small in size. 3. Left anterior descending artery:  The vessel is normal in size, but     there is mild ectasia in the proximal segment with minor     irregularities.  In the mid segment there is a 40% discrete     stenosis.  The rest of the LAD is free of significant disease.     First diagonal is normal in size with a 90% ostial stenosis.  This     was described on previous cardiac catheterization.  Second diagonal     is normal in size and free of significant disease.  Third diagonal     is very small in size. 4. Right coronary artery:  The vessel is large in size and dominant.     There is a 30% proximal stenosis with minor irregularities in the     mid segment.  Distally, there is 30% tubular  stenosis.  The PDA is     normal in size and free of significant disease.  First posterolateral branch is large in size, second posterolateral     branch is overall small.  STUDY CONCLUSION: 1. Significant disease in first diagonal branch at the ostium which     was described on previous cardiac catheterization.  Otherwise,     there is mild coronary artery disease. 2. Normal LV systolic function. 3. No revascularization is advised.  Recommend medical therapy.     Proceed with permanent pacemaker placement per Electrophysiology.     Aggressive treatment of risk factors is recommended for his mild     coronary artery disease.     Lorine Bears, MD     MA/MEDQ  D:  07/05/2011  T:  07/05/2011  Job:  [id number]  Electronically Signed by Lorine Bears MD on 07/06/2011 02:38:43 PM

## 2011-07-06 NOTE — Telephone Encounter (Signed)
SPOKE WITH PT AND SON HAS CONCERNS  WITH STARTING METOPROLOL   DOES NOT THINK HE TOLERATED METOPROLOL IN PAST  CAUSED DIZZINESS  PER  PT B/P HAS BEEN GOOD INSTRUCTED TO HOLD MED AND TO MONITOR B/P AND TO  CALL WITH READINGS  NEXT WEEK AND IF NOTES HIGH TO CALL  BEFORE HAND  WILL FORWARD TO DR Ladona Ridgel FOR REVIEW./CY

## 2011-07-07 ENCOUNTER — Telehealth: Payer: Self-pay | Admitting: Internal Medicine

## 2011-07-07 NOTE — Telephone Encounter (Signed)
Pt had cath on Weds,  And has some swelling to the wrist site.  No pain or redness.  Is this normal?

## 2011-07-07 NOTE — Telephone Encounter (Signed)
Patient has a cardiac cath yesterday. Patient notice a small know beside the cath entrance site on his wrist hi would like to know if that is notmal. The knot is About 1/4 of inch wide pt. denies redness discoloration nor pain. Pt. Was made aware that if he starting to have any  Changes, like increased swelling discoloration pain in the area or redness, to go to the ER. Pt. Verbalized understanding.

## 2011-07-07 NOTE — Telephone Encounter (Signed)
Pt calling stating his hand slightly swollen and has some drainage "dry" under bandage--pt had pacer insertion --advised drainage is normal, as long as it's just a small amount and swelling in hand will go down slowly--as long as circ. And sensation in hand are OK---explained blanching to pt and had him do it to check--pt agrees--nt

## 2011-07-07 NOTE — Telephone Encounter (Signed)
Pt to clal if any dc from incision and noticed under bandage a dark red tint, also hand is swollen

## 2011-07-10 ENCOUNTER — Other Ambulatory Visit: Payer: Managed Care, Other (non HMO)

## 2011-07-13 ENCOUNTER — Telehealth: Payer: Self-pay | Admitting: *Deleted

## 2011-07-13 ENCOUNTER — Encounter (INDEPENDENT_AMBULATORY_CARE_PROVIDER_SITE_OTHER): Payer: Managed Care, Other (non HMO) | Admitting: Surgery

## 2011-07-13 NOTE — Telephone Encounter (Signed)
Pt called in after having a pacemaker inserted, and did not want to take Metoprolol his Cardiologist prescribed.  Advised to ask his Cardiologist about the Metoprolol.

## 2011-07-14 NOTE — Op Note (Signed)
  NAMEMarland Kitchen  ODUS, CLASBY NO.:  [id number]  MEDICAL RECORD NO.:  [id number]  LOCATION:  6524                         FACILITY:  [facility]  PHYSICIAN:  Doylene Canning. Ladona Ridgel, MD    DATE OF BIRTH:  Jun 13, 1956  DATE OF PROCEDURE:  07/05/2011 DATE OF DISCHARGE:                              OPERATIVE REPORT   PROCEDURE PERFORMED:  Insertion of a dual-chamber pacemaker.  INDICATION:  Systematic bradycardia with long pauses and syncope.  INTRODUCTION:  The patient is a 55 year old man with recurrent syncopal episodes and documented periods of profound bradycardia with pauses up to 5 seconds.  He is now referred for dual-chamber pacemaker insertion.  PROCEDURE:  After informed consent was obtained, the patient was taken to diagnostic EP lab in a fasting state.  After usual preparation and draping, intravenous fentanyl and midazolam were given for sedation.  30 mL of lidocaine was infiltrated into the left infraclavicular region.  A 5-cm incision was carried out over this region.  Electrocautery was utilized to dissect down to the fascial plane.  The left subclavian vein was punctured x2, and the St. Jude model 2088T 58-cm active fixation pacing lead, serial number [id number] was advanced into the right ventricle, and the St. Jude model 2088T 52-cm active fixation pacing lead, serial number [id number] was advanced into the right atrium. Mapping was carried out in the right ventricle at the final site.  R- waves measured 9 mV.  The pacing impedance was 800 ohms and threshold was a volt of 0.4 milliseconds.  10 volts pacing did not stimulate the diaphragm.  With the ventricular lead in satisfactory position, attention was then turned to placement of atrial leads, placed in anterolateral portion of the right atrium where P-waves measured 5 mV. The pacing threshold was a volt of 0.4 milliseconds and the pacing impedance was 760 ohms.  Again, 10 volts pacing did not stimulate  the diaphragm.  With both the atrial and ventricular leads in satisfactory position, they were secured to the subpectoralis fascia with a figure-of- eight silk suture.  The sewing sleeve was secured with silk suture. Electrocautery was utilized to make subcutaneous pocket.  Antibiotic irrigation was utilized to irrigate the pocket.  The St. Jude Accent DR RF dual-chamber pacemaker, serial number [id number] was connected to the atrial and RV leads and placed back in the subcutaneous pocket where additional antibiotic irrigation was utilized to irrigate the pocket. Electrocautery utilized to assure hemostasis.  The incision was closed with 2-0 and 3-0 Vicryl.  Benzoin and Steri-Strips were painted on the skin, pressure dressing was applied, the patient was returned to his room in satisfactory condition.  COMPLICATIONS:  There were no immediate procedure complications.  RESULTS:  Demonstrates successful implantation of a St. Jude dual- chamber pacemaker in a patient with symptomatic bradycardia.     Doylene Canning. Ladona Ridgel, MD     GWT/MEDQ  D:  07/05/2011  T:  07/06/2011  Job:  [id number]  cc:   Duke Salvia, MD, [facility]  Electronically Signed by Lewayne Bunting MD on 07/14/2011 09:53:28 AM

## 2011-07-17 ENCOUNTER — Ambulatory Visit: Payer: Managed Care, Other (non HMO) | Admitting: Internal Medicine

## 2011-07-17 ENCOUNTER — Ambulatory Visit: Payer: Managed Care, Other (non HMO) | Admitting: *Deleted

## 2011-07-17 ENCOUNTER — Ambulatory Visit (INDEPENDENT_AMBULATORY_CARE_PROVIDER_SITE_OTHER): Payer: Managed Care, Other (non HMO) | Admitting: *Deleted

## 2011-07-17 DIAGNOSIS — I498 Other specified cardiac arrhythmias: Secondary | ICD-10-CM

## 2011-07-17 NOTE — Progress Notes (Signed)
Wound check-PPM 

## 2011-08-08 ENCOUNTER — Telehealth: Payer: Self-pay | Admitting: Internal Medicine

## 2011-08-08 NOTE — Telephone Encounter (Signed)
Pt calling to discuss a small pin sized scab on pt incision site of pace maker insertion. Pt didn't know if this is something of concern. Please return pt call to discuss further.

## 2011-08-08 NOTE — Telephone Encounter (Signed)
I let paula know and she wants pt to come in to make sue that there is not a stitch inside

## 2011-08-08 NOTE — Telephone Encounter (Signed)
Spoke with pt.  I asked pt about spot at incision site. He  states that it is not red or warm to the touch. I told him if he has any changes to call back.

## 2011-08-09 ENCOUNTER — Ambulatory Visit (INDEPENDENT_AMBULATORY_CARE_PROVIDER_SITE_OTHER): Payer: Managed Care, Other (non HMO) | Admitting: *Deleted

## 2011-08-09 DIAGNOSIS — R0989 Other specified symptoms and signs involving the circulatory and respiratory systems: Secondary | ICD-10-CM

## 2011-08-09 DIAGNOSIS — I498 Other specified cardiac arrhythmias: Secondary | ICD-10-CM

## 2011-08-09 LAB — PACEMAKER DEVICE OBSERVATION: DEVICE MODEL PM: 7250770

## 2011-08-15 ENCOUNTER — Telehealth: Payer: Self-pay | Admitting: Internal Medicine

## 2011-08-15 NOTE — Telephone Encounter (Signed)
Pt calling to state that his merlon (Sp?) device beeped pt said it wasn't his device something else beeped.   Pt also wants to know what is the normal time period in which pt can mow grass and normal everyday chores.  Please return call to discuss further.

## 2011-08-15 NOTE — Telephone Encounter (Signed)
Instructed pt to call st.jude to find out if his transmitter is ok.

## 2011-08-28 ENCOUNTER — Telehealth: Payer: Self-pay | Admitting: Internal Medicine

## 2011-08-28 NOTE — Telephone Encounter (Signed)
Spoke with patient and he was told no more than 10 pounds for over 8 weeks  Do not use 20lb weed eater until after 8 weeks is up

## 2011-08-28 NOTE — Telephone Encounter (Signed)
Pt has questions regarding how much can he lift, can he do yard work and things like that.

## 2011-09-06 ENCOUNTER — Telehealth: Payer: Self-pay | Admitting: Internal Medicine

## 2011-09-06 NOTE — Telephone Encounter (Signed)
Pt calling to inform nurse that he had a dizzy spell the other day and pt also wanted to double check with nurse that is ok for him to ride a lawn mower. Please return pt call to discuss further.

## 2011-09-07 LAB — GLUCOSE, CAPILLARY: Glucose-Capillary: 312 mg/dL — ABNORMAL HIGH (ref 70–99)

## 2011-09-07 LAB — CBC
HCT: 44.4 % (ref 39.0–52.0)
Platelets: 239 10*3/uL (ref 150–400)
RBC: 4.92 MIL/uL (ref 4.22–5.81)
WBC: 6.5 10*3/uL (ref 4.0–10.5)

## 2011-09-07 LAB — DIFFERENTIAL
Lymphocytes Relative: 32 % (ref 12–46)
Lymphs Abs: 2.1 10*3/uL (ref 0.7–4.0)
Neutrophils Relative %: 54 % (ref 43–77)

## 2011-09-07 LAB — BASIC METABOLIC PANEL
BUN: 11 mg/dL (ref 6–23)
Creatinine, Ser: 0.79 mg/dL (ref 0.4–1.5)
GFR calc Af Amer: >60 mL/min (ref >60)
GFR calc non Af Amer: >60 mL/min (ref >60)
Potassium: 3.5 mEq/L (ref 3.5–5.1)

## 2011-09-07 LAB — URINALYSIS, ROUTINE W REFLEX MICROSCOPIC
Bilirubin Urine: NEGATIVE
Ketones, ur: NEGATIVE mg/dL
Nitrite: NEGATIVE
Protein, ur: NEGATIVE mg/dL
Urobilinogen, UA: 0.2 mg/dL (ref 0.0–1.0)
pH: 6 (ref 5.0–8.0)

## 2011-09-07 LAB — PROTIME-INR
INR: 0.9 (ref 0.00–1.49)
Prothrombin Time: 12.5 seconds (ref 11.6–15.2)

## 2011-09-07 LAB — RAPID URINE DRUG SCREEN, HOSP PERFORMED
Amphetamines: NOT DETECTED
Opiates: NOT DETECTED
Tetrahydrocannabinol: NOT DETECTED

## 2011-09-07 NOTE — Telephone Encounter (Signed)
Called and spoke with patient  He states he was leaning over and got up quickly and became dizzy  He had not had this feeling since then and thinks it was due to that but just wanted to report it  I also let him know he can ride his riding lawnmower

## 2011-09-14 NOTE — Discharge Summary (Signed)
NAMEMarland Paul  LENNYN, BELLANCA NO.:  [id number]  MEDICAL RECORD NO.:  [id number]  LOCATION:  6524                         FACILITY:  [facility]  PHYSICIAN:  Doylene Canning. Ladona Ridgel, MD    DATE OF BIRTH:  1956-01-23  DATE OF ADMISSION:  07/04/2011 DATE OF DISCHARGE:  07/06/2011                              DISCHARGE SUMMARY   PRIMARY CARE PHYSICIAN:  Valetta Mole. Swords, MD  ELECTROPHYSIOLOGIST:  New with Dr. Lewayne Bunting.  PRIMARY DIAGNOSIS:  Syncope with documented sinus node dysfunction with heart down to 18 beats per minute.  SECONDARY DIAGNOSES: 1. Obstructive sleep apnea. 2. Systolic hypertension. 3. Coronary artery disease.     a.     The patient had a known stenosis of his first diagonal by      catheterization in 2002, this was reevaluated by catheterization      after admission, which demonstrated a 90% ostial stenosis with no      progression of disease.  Revascularization was not recommended at      this time. 4. Normal ejection fraction.  ALLERGIES:  The patient has no known drug allergies.  PROCEDURES THIS ADMISSION: 1. A 2-D echocardiogram on July 05, 2011, demonstrated an ejection     fraction of 55-60% with no regional wall motion abnormalities. 2. Catheterization on July 05, 2011 by Dr. Lorine Bears     demonstrated stable disease in the first diagonal branch of the     ostium.  The patient has normal LV function.  No revascularization     was advised at this time.  Medical therapy was recommended. 3. Implantation of a dual-chamber pacemaker on July 05, 2011, by Dr.     Ladona Ridgel.  The patient received a Designer, jewellery model number     [id number] Accent pacemaker.  The right atrial lead is model number     [id number].  The right ventricular lead is also model number [id number].     The patient had no early apparent complications. 4. Chest x-ray on July 06, 2011, demonstrated no pneumothorax, status     post pacemaker implant.  BRIEF HISTORY OF PRESENT ILLNESS:   [name] is a 55 year old male with a history of hypertension and obstructive sleep apnea.  Over the last 2 years, he has undergone a purposeful 70-pound weight loss.  On the day of admission, he had awoken and shaved and gone downstairs to get clothes out of the dryer, at which point, he had brief episode of dizziness and then a sudden syncopal spell.  He is not sure how long he was out for.  When he awoke, he was able to stand without recurrent dizziness.  Because of these things, he called 911 and was brought to [facility].  While in the emergency room, telemetry demonstrated sinus node dysfunction with heart rate into the 20s.  Because of this, he was admitted.  HOSPITAL COURSE:  The patient was admitted on July 04, 2011, for syncope and sinus node dysfunction.  Dr. Graciela Husbands evaluated the patient and felt that it was prudent to rule out progressive coronary artery disease. Therefore, he underwent cardiac catheterization.  The results of this  were described as above.  Pacemaker implantation was recommended to treat his sinus node dysfunction and this was carried out by Dr. Ladona Ridgel on July 05, 2011.  The patient was monitored on telemetry overnight, which demonstrated atrial pacing.  Chest x-ray was obtained, which demonstrated no pneumothorax, status post pacemaker implant.  The patient left chest was without hematoma or ecchymosis.  Because of the patient's coronary artery disease, Dr. Ladona Ridgel recommended starting low- dose statin and beta-blocker with follow up in 3 months.  LABORATORY DATA:  Cardiac enzymes negative x3.  Ferritin 143.  White count 8.7, hemoglobin 15.1, hematocrit 43.2, platelets 277.  Sed rate 10.  Total cholesterol 154, triglyceride 145, HDL 30, LDL 95.  TSH 0.747.  Magnesium 2.2.  Potassium 4.2, sodium 139, BUN 12, creatinine 0.94.  FOLLOWUP APPOINTMENTS: 1. [facility] on July 17, 2011, at 10:20 a.m.     for a wound check. 2.  Dr. Ladona Ridgel on October 10, 2011 at 9:15 a.m. 3. Dr. Cato Mulligan as scheduled.  DISCHARGE INSTRUCTIONS: 1. Increased activity slowly. 2. No driving for 1 week. 3. Follow a low-sodium, heart-healthy diet. 4. See supplemental device discharge instructions for wound care and     arm mobility.  DISCHARGE MEDICATIONS: 1. Metoprolol succinate 25 mg one tablet daily. 2. Simvastatin 20 mg one tablet daily. 3. Aspirin 81 mg daily.  DISPOSITION:  The patient was seen and examined by Dr. Ladona Ridgel on July 06, 2011, and considered stable for discharge.  DURATION OF DISCHARGE ENCOUNTER:  Thirty five minutes.     Gypsy Balsam, RN,BSN   ______________________________ Doylene Canning. Ladona Ridgel, MD    AS/MEDQ  D:  07/06/2011  T:  07/06/2011  Job:  [id number]  cc:   Valetta Mole. Swords, MD  Electronically Signed by Gypsy Balsam RNBSN on 07/21/2011 10:23:21 AM Electronically Signed by Lewayne Bunting MD on 09/14/2011 06:34:49 PM

## 2011-09-15 ENCOUNTER — Telehealth: Payer: Self-pay | Admitting: Internal Medicine

## 2011-09-15 NOTE — Telephone Encounter (Signed)
Pt called He wants to know if he can use leaf blower or weed eater he has pacemaker

## 2011-09-15 NOTE — Telephone Encounter (Signed)
Ok for both  They weigh < 10lbs a piece  Patient will have the majority of the weight in his right hand and the left hand is used for guiding

## 2011-09-20 ENCOUNTER — Telehealth: Payer: Self-pay | Admitting: Internal Medicine

## 2011-09-20 NOTE — Telephone Encounter (Signed)
Pt getting bronchitis and wants to know which otc meds ok to take until see's his pcp

## 2011-09-20 NOTE — Telephone Encounter (Signed)
Returned call to patient ---spoke with patient he is going to see his PCP tomorrow and is in hopes of starting an antibiotic  I have told him to try Mussinex or plain Robitussin for cough  He has Nyquil he has been taking at night to help him sleep

## 2011-09-21 ENCOUNTER — Encounter: Payer: Self-pay | Admitting: Internal Medicine

## 2011-09-21 ENCOUNTER — Ambulatory Visit (INDEPENDENT_AMBULATORY_CARE_PROVIDER_SITE_OTHER): Payer: Managed Care, Other (non HMO) | Admitting: Internal Medicine

## 2011-09-21 VITALS — BP 142/90 | Temp 98.7°F | Ht 75.0 in | Wt 282.0 lb

## 2011-09-21 DIAGNOSIS — J069 Acute upper respiratory infection, unspecified: Secondary | ICD-10-CM

## 2011-09-21 MED ORDER — DM-GUAIFENESIN ER 30-600 MG PO TB12: 1.0000 | ORAL_TABLET | Freq: Two times a day (BID) | ORAL | Status: AC

## 2011-09-21 MED ORDER — HYDROCODONE-HOMATROPINE 5-1.5 MG/5ML PO SYRP: 5.0000 mL | ORAL_SOLUTION | Freq: Three times a day (TID) | ORAL | Status: AC | PRN

## 2011-09-21 NOTE — Progress Notes (Signed)
  Subjective:    Patient ID: Paul Paul, male    DOB: [birth year], 55 y.o.   MRN: [id number]  HPI Nonproductive cough for 3 days.  No fever or SOB. Patient denies any wheezing.  Past Medical History  Diagnosis Date  . HYPERTENSION 12/10/2007  . Diabetes mellitus     now under control      Review of Systems Patient denies fevers shortness of breath.    Objective:   Physical Exam  Well-developed male in no acute distress. Neck is supple. Chest clear to auscultation cardiac exam S1-S2 are regular.      Assessment & Plan:  Patient with an upper respiratory infection. No evidence of bacterial infection. No antibiotic necessary. Discussed with the patient.

## 2011-09-25 ENCOUNTER — Telehealth: Payer: Self-pay | Admitting: Internal Medicine

## 2011-09-25 NOTE — Telephone Encounter (Signed)
Pt called and said that he still has cough/chest congestion/sorethroat. Pt said that cough syrup and mucinex in not working. Pt is req [facility] [address]

## 2011-09-25 NOTE — Telephone Encounter (Signed)
Can start doxycycline 100 mg po bid for 7 days

## 2011-09-26 MED ORDER — DOXYCYCLINE HYCLATE 100 MG PO TABS: 100.0000 mg | ORAL_TABLET | Freq: Two times a day (BID) | ORAL | Status: DC

## 2011-09-26 NOTE — Telephone Encounter (Signed)
rx sent in electronically, pt aware 

## 2011-09-27 ENCOUNTER — Telehealth: Payer: Self-pay | Admitting: Internal Medicine

## 2011-09-27 NOTE — Telephone Encounter (Signed)
Pt called and said that the Doxycycline is breaking his face out with a rash. Pt has discontinued med and is req an alternative. [facility] [address]

## 2011-09-28 MED ORDER — AMOXICILLIN 500 MG PO CAPS: 500.0000 mg | ORAL_CAPSULE | Freq: Three times a day (TID) | ORAL | Status: DC

## 2011-09-28 NOTE — Telephone Encounter (Signed)
Stop doxy---add to allergy list Change to amoxicillin 500 mg po tid Allergies  Allergen Reactions  . Lisinopril     REACTION: rash  . Losartan Potassium-Hctz     REACTION: dizziness, fatigue

## 2011-09-28 NOTE — Telephone Encounter (Signed)
Pt called again he is requesting you contact him when the medicine is called in

## 2011-09-28 NOTE — Telephone Encounter (Signed)
Pt aware, rx called in  

## 2011-10-04 ENCOUNTER — Telehealth: Payer: Self-pay | Admitting: *Deleted

## 2011-10-04 NOTE — Telephone Encounter (Signed)
He has already tried an antibiotic. I would recommend prednisone 40 mg daily for 5 days. Then 20 mg daily for 5 days. Then 10 mg daily for 5 days. Then 5 mg by mouth daily for 5 days.

## 2011-10-04 NOTE — Telephone Encounter (Signed)
Pt is still coughing and wheezing, and would like an antibiotic.  He does not feel ill, and is not running a fever.  [facility] [address] [address] [facility]

## 2011-10-05 MED ORDER — PREDNISONE 10 MG PO TABS: 10.0000 mg | ORAL_TABLET | ORAL | Status: DC

## 2011-10-06 ENCOUNTER — Telehealth: Payer: Self-pay | Admitting: *Deleted

## 2011-10-06 ENCOUNTER — Ambulatory Visit (INDEPENDENT_AMBULATORY_CARE_PROVIDER_SITE_OTHER): Payer: Managed Care, Other (non HMO) | Admitting: Internal Medicine

## 2011-10-06 ENCOUNTER — Encounter: Payer: Self-pay | Admitting: Internal Medicine

## 2011-10-06 ENCOUNTER — Ambulatory Visit (INDEPENDENT_AMBULATORY_CARE_PROVIDER_SITE_OTHER)
Admission: RE | Admit: 2011-10-06 | Discharge: 2011-10-06 | Disposition: A | Discharge status: Home / Self Care | Payer: Managed Care, Other (non HMO) | Source: Ambulatory Visit | Attending: Internal Medicine | Admitting: Internal Medicine

## 2011-10-06 VITALS — BP 120/80 | HR 77 | Temp 98.0°F | Wt 234.0 lb

## 2011-10-06 DIAGNOSIS — R05 Cough: Secondary | ICD-10-CM

## 2011-10-06 DIAGNOSIS — R059 Cough, unspecified: Secondary | ICD-10-CM

## 2011-10-06 DIAGNOSIS — Z8679 Personal history of other diseases of the circulatory system: Secondary | ICD-10-CM

## 2011-10-06 DIAGNOSIS — Z950 Presence of cardiac pacemaker: Secondary | ICD-10-CM

## 2011-10-06 DIAGNOSIS — E119 Type 2 diabetes mellitus without complications: Secondary | ICD-10-CM

## 2011-10-06 DIAGNOSIS — J988 Other specified respiratory disorders: Secondary | ICD-10-CM

## 2011-10-06 MED ORDER — PREDNISONE 20 MG PO TABS: ORAL_TABLET | ORAL | Status: DC

## 2011-10-06 MED ORDER — AZITHROMYCIN 250 MG PO TABS: 250.0000 mg | ORAL_TABLET | ORAL | Status: AC

## 2011-10-06 MED ORDER — ALBUTEROL SULFATE (2.5 MG/3ML) 0.083% IN NEBU
2.5000 mg | INHALATION_SOLUTION | Freq: Once | RESPIRATORY_TRACT | Status: AC
  Administered 2011-10-06: 2.5 mg via RESPIRATORY_TRACT

## 2011-10-06 NOTE — Telephone Encounter (Signed)
[facility] Triage Call Report Triage Record Num: [id number] Operator: Alphonsa Overall Patient Name: Ozzie Knobel Call Date & Time: 10/05/2011 8:02:59PM Patient Phone: [phone] PCP: Valetta Mole. Swords Patient Gender: Male PCP Fax : [phone] Patient DOB: [birth year] [facility] Name: [facility] Reason for Call: Mr Lingelbach calling about medication that was to be called in 10/05/11. Pt spoke with office 10/05/11. Was told steroid would be called in. [facility] [address] [address] [phone] at pharmacy. Dr Sanda Linger on call paged about script message left. Pt has not been evaluated. Wheezing-no inhaler-just finished Amox 10/03/11. Afebrile. Continuous coughing causing difficulty breathing. Helps pt to lie down. OTC not helping. Care advice given. Mr Scarpelli aware needs ED care now for breathing concern. Pt declines and states will f/u with MD office in am. Protocol(s) Used: Cough - Adult Recommended Outcome per Protocol: See ED Immediately Reason for Outcome: Continuous cough causing difficulty breathing Care Advice: ~ Another adult should drive. ~ Do not give the patient anything to eat or drink. Call EMS 911 if loss of consciousness, struggling to breathe, experiences new confusion or extreme drowsiness, change in skin color, or has chest pain or discomfort lasting 5 minutes or more. ~ ~ IMMEDIATE ACTION Write down provider's name. List or place the following in a bag for transport with the patient: current prescription and/or nonprescription medications; alternative treatments, therapies and medications; and street drugs. ~ ~ Place person in a position of comfort and loosen tight clothing. 10/05/2011 8:27:45PM Page 1 of 1 [email or web address]

## 2011-10-06 NOTE — Patient Instructions (Signed)
Get chest x ray  Will notify you of results  Plan z pack andposs  pred also then plan fu

## 2011-10-06 NOTE — Progress Notes (Signed)
  Subjective:    Patient ID: Paul Paul, male    DOB: 08/15/1956, 55 y.o.   MRN: [id number]  HPI Patient comes in today for SDA  For acute problem evaluation. However he has been seen for this early in the illness Coughing  For 3 weeks. Getting worse and wheezing achy but no sweats   No hx of asthma.  Took   "Whole  Bottle of amoxicilliin "  Had flushing rash  On feace with doxy  And then called and pred was called in but pharmacy didn't have it so didn't take it.  So comesi in for evaluation House hold with coughing illness  Wife put on z pack  Called on call service  No hx of underlying lung disease   DM reported control    Pacer placed in  AUGust .   2012  Paul Paul  . For low pulse  ( not on problem list yet)"I was flat lined" no infection and ok with this . Some sob from deconditioning no hemoptysis   Review of Systems NO fever no NVD  No new rash got rash with doxy   No hx of wheezing  Past history family history social history reviewed in the electronic medical record.     Objective:   Physical Exam WDWN in nand with intermittent cough   Dry HEENT: Normocephalic ;atraumatic , Eyes;  PERRL, EOMs  Full, lids and conjunctiva clear,,Ears: no deformities, canals nl, TM landmarks normal, Nose: no deformity or discharge  Mouth : OP clear without lesion or edema . Some mucoid pnd Neck: Supple without adenopathy or masses or bruits Chest:  Unlabored  without wheezes  or rhonchi  Crackles  Dry at right base even after coughing  CV:  S1-S2 no gallops or murmurs peripheral perfusion is normal Skin: normal capillary refill ,turgor , color: No acute rashes ,petechiae or bruising  After neb increase air mocement but still  Rales and crackles  Rales at right base       Assessment & Plan:  Prolonged cough   Poss atypicals  Right lobe ? pneumonitis but no fever   High risk  Has wheezing declined inhaler   Get c xray and then plan  Fu as appropriate  Cxray shows chronic changes and  bronchitis  Will go rs for atypical bact  and with pred   For wheezing (Pt aware of sugar effects.)   Pacer  ? For bradycardia  17 range  . ? Sinus node dysfunction.

## 2011-10-07 ENCOUNTER — Encounter: Payer: Self-pay | Admitting: Internal Medicine

## 2011-10-07 DIAGNOSIS — Z950 Presence of cardiac pacemaker: Secondary | ICD-10-CM | POA: Insufficient documentation

## 2011-10-07 DIAGNOSIS — J988 Other specified respiratory disorders: Secondary | ICD-10-CM | POA: Insufficient documentation

## 2011-10-08 NOTE — Telephone Encounter (Signed)
See med list, this was sent at time of OV.... i think

## 2011-10-09 ENCOUNTER — Telehealth: Payer: Self-pay | Admitting: Family Medicine

## 2011-10-09 NOTE — Telephone Encounter (Signed)
Pt p/u rx already

## 2011-10-10 ENCOUNTER — Encounter: Payer: Self-pay | Admitting: Internal Medicine

## 2011-10-10 ENCOUNTER — Ambulatory Visit (INDEPENDENT_AMBULATORY_CARE_PROVIDER_SITE_OTHER): Payer: Managed Care, Other (non HMO) | Admitting: Internal Medicine

## 2011-10-10 ENCOUNTER — Telehealth: Payer: Self-pay | Admitting: *Deleted

## 2011-10-10 DIAGNOSIS — Z950 Presence of cardiac pacemaker: Secondary | ICD-10-CM

## 2011-10-10 DIAGNOSIS — Z8679 Personal history of other diseases of the circulatory system: Secondary | ICD-10-CM

## 2011-10-10 DIAGNOSIS — I10 Essential (primary) hypertension: Secondary | ICD-10-CM

## 2011-10-10 DIAGNOSIS — R05 Cough: Secondary | ICD-10-CM

## 2011-10-10 DIAGNOSIS — I498 Other specified cardiac arrhythmias: Secondary | ICD-10-CM

## 2011-10-10 LAB — PACEMAKER DEVICE OBSERVATION
AL IMPEDENCE PM: 462.5 Ohm
BAMS-0003: 70 {beats}/min
BATTERY VOLTAGE: 2.9629 V
DEVICE MODEL PM: 7250770

## 2011-10-10 NOTE — Assessment & Plan Note (Signed)
His blood pressure is fairly well controlled. I encouraged him to maintain a low-sodium diet, and restart his regular daily exercise program. He will continue his current medications for now though he may require antihypertensive medications in the future.

## 2011-10-10 NOTE — Assessment & Plan Note (Signed)
His symptoms have been slow to improve. I have encouraged him to try taking Mucinex.

## 2011-10-10 NOTE — Telephone Encounter (Signed)
See previous phone note.  

## 2011-10-10 NOTE — Progress Notes (Signed)
HPI [name] returns today for followup. He is a pleasant 55 year old man with syncope secondary to bradycardia and underwent permanent pacemaker insertion approximately 4 months ago. The patient has done well in the interim. He is somewhat anxious about starting back his exercise routine. He has had no syncope, and denies chest pain or shortness of breath. He has been bothered by a chronic cough thought secondary to an upper inspiratory illness. This has been present for 3 weeks, is nonproductive, and is currently not associated with fevers or chills. Allergies  Allergen Reactions  . Lisinopril     REACTION: rash  . Losartan Potassium-Hctz     REACTION: dizziness, fatigue  . Doxycycline Rash     Current Outpatient Prescriptions  Medication Sig Dispense Refill  . aspirin 81 MG tablet Take 81 mg by mouth daily.        Marland Kitchen azithromycin (ZITHROMAX Z-PAK) 250 MG tablet Take 1 tablet (250 mg total) by mouth as directed. Take 2 po first day, then 1 po qd  6 each  0     Past Medical History  Diagnosis Date  . HYPERTENSION 12/10/2007  . Diabetes mellitus     now under control    ROS:   All systems reviewed and negative except as noted in the HPI.   Past Surgical History  Procedure Date  . Cardiac catheterization     normal  . Septal deviation   . Knee surgery     left; arthoscopic     Family History  Problem Relation Age of Onset  . Heart attack Mother   . Heart disease Mother   . Diabetes Mother   . Heart attack Father   . Heart disease Father      History   Social History  . Marital Status: Married    Spouse Name: N/A    Number of Children: N/A  . Years of Education: N/A   Occupational History  . Not on file.   Social History Main Topics  . Smoking status: Former Smoker    Quit date: 01/13/1981  . Smokeless tobacco: Not on file  . Alcohol Use: No  . Drug Use: No  . Sexually Active:    Other Topics Concern  . Not on file   Social History Narrative   Not smoking      BP 144/88  Pulse 82  Ht 6\' 3"  (1.905 m)  Wt 284 lb 12.8 oz (129.184 kg)  BMI 35.60 kg/m2  Physical Exam:  Well appearing NAD HEENT: Unremarkable Neck:  No JVD, no thyromegally Lymphatics:  No adenopathy Back:  No CVA tenderness Lungs:  Clear with no wheezes, rales, or rhonchi. Well-healed pacemaker incision. HEART:  Regular rate rhythm, no murmurs, no rubs, no clicks Abd:  soft, positive bowel sounds, no organomegally, no rebound, no guarding Ext:  2 plus pulses, no edema, no cyanosis, no clubbing Skin:  No rashes no nodules Neuro:  CN II through XII intact, motor grossly intact  DEVICE  Normal device function.  See PaceArt for details.   Assess/Plan:

## 2011-10-10 NOTE — Telephone Encounter (Signed)
Make sure this has been done

## 2011-10-10 NOTE — Assessment & Plan Note (Signed)
His device is working normally. We'll plan to recheck in several months. 

## 2011-10-10 NOTE — Patient Instructions (Signed)
Your physician wants you to follow-up in: 07/2011 You will receive a reminder letter in the mail two months in advance. If you don't receive a letter, please call our office to schedule the follow-up appointment.

## 2011-10-10 NOTE — Telephone Encounter (Signed)
[facility] Triage Call Report Triage Record Num: [id number] Operator: Alphonsa Overall Patient Name: Paul Paul Call Date & Time: 10/05/2011 8:02:59PM Patient Phone: [phone] PCP: Valetta Mole. Swords Patient Gender: Male PCP Fax : [phone] Patient DOB: [birth year] [facility] Name[facility] Reason for Call: [name] calling about medication that was to be called in 10/05/11. Pt spoke with office 10/05/11. Was told steroid would be called in. [facility] [address] [address] [phone] at pharmacy. Dr Sanda Linger on call paged about script message left. Pt has not been evaluated. Wheezing-no inhaler-just finished Amox 10/03/11. Afebrile. Continuous coughing causing difficulty breathing. Helps pt to lie down. OTC not helping. Care advice given. [name] aware needs ED care now for breathing concern. Pt declines and states will f/u with MD office in am. Protocol(s) Used: Cough - Adult Recommended Outcome per Protocol: See ED Immediately Reason for Outcome: Continuous cough causing difficulty breathing Care Advice: ~ Another adult should drive. ~ Do not give the patient anything to eat or drink. Call EMS 911 if loss of consciousness, struggling to breathe, experiences new confusion or extreme drowsiness, change in skin color, or has chest pain or discomfort lasting 5 minutes or more. ~ ~ IMMEDIATE ACTION Write down provider's name. List or place the following in a bag for transport with the patient: current prescription and/or nonprescription medications; alternative treatments, therapies and medications; and street drugs. ~ ~ Place person in a position of comfort and loosen tight clothing. 11/

## 2011-10-27 ENCOUNTER — Other Ambulatory Visit (INDEPENDENT_AMBULATORY_CARE_PROVIDER_SITE_OTHER): Payer: Managed Care, Other (non HMO)

## 2011-10-27 DIAGNOSIS — I10 Essential (primary) hypertension: Secondary | ICD-10-CM

## 2011-10-27 DIAGNOSIS — E119 Type 2 diabetes mellitus without complications: Secondary | ICD-10-CM

## 2011-10-27 LAB — LIPID PANEL
HDL: 38.3 mg/dL — ABNORMAL LOW (ref >39.00)
LDL Cholesterol: 125 mg/dL — ABNORMAL HIGH (ref 0–99)
Total CHOL/HDL Ratio: 5
Triglycerides: 134 mg/dL (ref 0.0–149.0)

## 2011-10-27 LAB — HEPATIC FUNCTION PANEL
AST: 30 U/L (ref 0–37)
Albumin: 3.8 g/dL (ref 3.5–5.2)

## 2011-10-27 LAB — BASIC METABOLIC PANEL
CO2: 22 mEq/L (ref 19–32)
Calcium: 9.3 mg/dL (ref 8.4–10.5)
Chloride: 110 mEq/L (ref 96–112)
Sodium: 141 mEq/L (ref 135–145)

## 2011-10-27 LAB — HEMOGLOBIN A1C: Hgb A1c MFr Bld: 6.5 % (ref 4.6–6.5)

## 2011-11-06 ENCOUNTER — Ambulatory Visit (INDEPENDENT_AMBULATORY_CARE_PROVIDER_SITE_OTHER): Payer: Managed Care, Other (non HMO) | Admitting: Internal Medicine

## 2011-11-06 DIAGNOSIS — Z23 Encounter for immunization: Secondary | ICD-10-CM

## 2011-11-07 ENCOUNTER — Telehealth: Payer: Self-pay | Admitting: Internal Medicine

## 2011-11-07 DIAGNOSIS — R05 Cough: Secondary | ICD-10-CM

## 2011-11-07 NOTE — Telephone Encounter (Signed)
Pt called and said that he has bronchitis again and is req to get a refill of azithromycin (ZITHROMAX Z-PAK) 250 MG tablet [facility] [address] Pt said that this med worked really well. Pt came in for ov to see Dr Fabian Sharp on 10/06/11 and was prescribed this med for bronchitis. Pls call in.

## 2011-11-07 NOTE — Telephone Encounter (Signed)
Pt informed and order sent to [facility]

## 2011-11-07 NOTE — Telephone Encounter (Signed)
I'm concerned that sxs are persisting... Should not need more antibiotics. I'm concerned he has another inflammatory problem  Get pulmonary function tests for chronic cough

## 2011-11-08 ENCOUNTER — Ambulatory Visit: Payer: Self-pay | Admitting: Internal Medicine

## 2011-11-09 ENCOUNTER — Ambulatory Visit (INDEPENDENT_AMBULATORY_CARE_PROVIDER_SITE_OTHER): Payer: Managed Care, Other (non HMO) | Admitting: Internal Medicine

## 2011-11-09 DIAGNOSIS — R05 Cough: Secondary | ICD-10-CM

## 2011-11-09 LAB — PULMONARY FUNCTION TEST

## 2011-11-09 NOTE — Progress Notes (Signed)
PFT done today. 

## 2011-11-15 ENCOUNTER — Telehealth: Payer: Self-pay | Admitting: Internal Medicine

## 2011-11-15 NOTE — Telephone Encounter (Signed)
Needs results of PFT

## 2011-11-15 NOTE — Telephone Encounter (Signed)
Pt was sent to [facility] on 11-09-11 for a cronic cough. Pt wanted to know more info about appt. Please contact

## 2011-11-16 ENCOUNTER — Encounter (HOSPITAL_COMMUNITY): Payer: Self-pay | Admitting: Emergency Medicine

## 2011-11-16 ENCOUNTER — Emergency Department (HOSPITAL_COMMUNITY): Payer: Managed Care, Other (non HMO)

## 2011-11-16 ENCOUNTER — Emergency Department (HOSPITAL_COMMUNITY)
Admission: EM | Admit: 2011-11-16 | Discharge: 2011-11-16 | Disposition: A | Discharge status: Home / Self Care | Payer: Managed Care, Other (non HMO) | Attending: Emergency Medicine | Admitting: Emergency Medicine

## 2011-11-16 ENCOUNTER — Other Ambulatory Visit: Payer: Self-pay

## 2011-11-16 DIAGNOSIS — R059 Cough, unspecified: Secondary | ICD-10-CM | POA: Insufficient documentation

## 2011-11-16 DIAGNOSIS — H538 Other visual disturbances: Secondary | ICD-10-CM | POA: Insufficient documentation

## 2011-11-16 DIAGNOSIS — I671 Cerebral aneurysm, nonruptured: Secondary | ICD-10-CM | POA: Insufficient documentation

## 2011-11-16 DIAGNOSIS — E119 Type 2 diabetes mellitus without complications: Secondary | ICD-10-CM | POA: Insufficient documentation

## 2011-11-16 DIAGNOSIS — Z950 Presence of cardiac pacemaker: Secondary | ICD-10-CM | POA: Insufficient documentation

## 2011-11-16 DIAGNOSIS — R05 Cough: Secondary | ICD-10-CM | POA: Insufficient documentation

## 2011-11-16 DIAGNOSIS — R42 Dizziness and giddiness: Secondary | ICD-10-CM

## 2011-11-16 DIAGNOSIS — Z7982 Long term (current) use of aspirin: Secondary | ICD-10-CM | POA: Insufficient documentation

## 2011-11-16 DIAGNOSIS — R232 Flushing: Secondary | ICD-10-CM | POA: Insufficient documentation

## 2011-11-16 DIAGNOSIS — I10 Essential (primary) hypertension: Secondary | ICD-10-CM | POA: Insufficient documentation

## 2011-11-16 HISTORY — DX: Presence of cardiac pacemaker: Z95.0

## 2011-11-16 HISTORY — DX: Bradycardia, unspecified: R00.1

## 2011-11-16 LAB — DIFFERENTIAL
Basophils Absolute: 0.1 10*3/uL (ref 0.0–0.1)
Eosinophils Absolute: 0.2 10*3/uL (ref 0.0–0.7)
Eosinophils Relative: 2 % (ref 0–5)
Lymphocytes Relative: 33 % (ref 12–46)
Lymphs Abs: 2.3 10*3/uL (ref 0.7–4.0)
Monocytes Absolute: 0.6 10*3/uL (ref 0.1–1.0)

## 2011-11-16 LAB — CBC
HCT: 46.1 % (ref 39.0–52.0)
MCH: 31 pg (ref 26.0–34.0)
MCV: 88.8 fL (ref 78.0–100.0)
Platelets: 253 10*3/uL (ref 150–400)
RDW: 13.1 % (ref 11.5–15.5)
WBC: 7.1 10*3/uL (ref 4.0–10.5)

## 2011-11-16 LAB — POCT I-STAT, CHEM 8
BUN: 13 mg/dL (ref 6–23)
Calcium, Ion: 1.21 mmol/L (ref 1.12–1.32)
Chloride: 106 mEq/L (ref 96–112)
Glucose, Bld: 134 mg/dL — ABNORMAL HIGH (ref 70–99)
TCO2: 25 mmol/L (ref 0–100)

## 2011-11-16 LAB — TROPONIN I: Troponin I: <0.3 ng/mL (ref <0.30)

## 2011-11-16 MED ORDER — ACETAMINOPHEN 325 MG PO TABS
650.0000 mg | ORAL_TABLET | Freq: Once | ORAL | Status: AC
  Administered 2011-11-16: 650 mg via ORAL
  Filled 2011-11-16: qty 2

## 2011-11-16 MED ORDER — IOHEXOL 350 MG/ML SOLN
75.0000 mL | Freq: Once | INTRAVENOUS | Status: AC | PRN
  Administered 2011-11-16: 75 mL via INTRAVENOUS

## 2011-11-16 MED ORDER — FENTANYL CITRATE 0.05 MG/ML IJ SOLN
50.0000 ug | Freq: Once | INTRAMUSCULAR | Status: AC
  Administered 2011-11-16: 50 ug via INTRAVENOUS
  Filled 2011-11-16: qty 2

## 2011-11-16 NOTE — Progress Notes (Signed)
   ELECTROPHYSIOLOGY DEVICE INTERROGATION NOTE    Patient Name: Paul Paul Date of Encounter: 11-16-2011   SUBJECTIVE: Asked to interrogate [employer] Jude pacemaker for symptoms of visual changes and dizziness today while driving.  DEVICE INTERROGATION: Public house manager Pacemaker- Model number [id number] DOI:07-05-2011  Battery voltage- 2.98V, estimated longevity 10-11 years.  Right atrial lead (1191YN)- amp- >32mV, imp 460 ohms, threshold 1V @0 . Right ventricular lead (8295AO)- amp-10.34mV, imp 540 ohms, threshold 1V @ 0.61msec  Programmed mode- DDDR 60/110 PAV-263ms, SAV-214ms  Episodes- 19 mode switch episodes comprising <1% of total time. Atrial rate 170-216bpm.  Longest episode 1 hour 26 minutes.  Last episode 11-11-11 at 8:47AM.  No arrhythmias noted today during symptoms.  EGM's available show likely SVT with 1:1 conduction.  No atrial fibrillation noted.   No changes made today.  Patient is followed remotely by [facility] at home and sees Dr Ladona Ridgel scheduled pacemaker follow up in August.  Will turn on [facility] alerts for high atrial rates to track frequency of SVT.  Pt is asymptomatic with these episodes.  Dr. Johney Frame to review device interrogation strips.  Signed, Gypsy Balsam RN, BSN 11/16/2011 11:07 AM    I have reviewed PPM interrogation as described above.  Normal pacemaker function. SVT episodes likely sinus tach, but could be atrial tachycardia.  He is asymptomatic with these and these do not correspond with symptoms today. No pacemaker findings to explain his presentation today. No changes made.  Resume normal pacemaker follow-up with Dr Ladona Ridgel. Call us if we can further assist while here. I have discussed above with Dr Rubin Payor.  Hillis Range, MD 12:16 PM 11/16/2011

## 2011-11-16 NOTE — ED Provider Notes (Signed)
3:46 PM Spoke with dr. Franky Macho. Will see patient in the office regarding aneurysm. Spoke with Dr. Lyman Speller. He is here to see the patient. Feels patient will likely be discharged home. Patient reports headache is improved after having fentanyl  Thomasene Lot, [place] 11/16/11 1602    4:11 PM Dr Lyman Speller has been in to see the patient and clears patient for discharge. Feels that likely was having symptoms do to elevation of blood pressure. According to CT angiogram of the head aneurysm is unchanged since 2006 I have discussed this with patient. Dr. Lyman Speller has also discussed this with patient and family. Spouse is very anxious about having him go home despite no acute changes in aneurysm. Therefore advised patient to return immediately for worsening symptoms such as sudden onset headache, numbness, tingling, weakness, difficulty in speech or walking.  Thomasene Lot, [place] 11/16/11 [phone]

## 2011-11-16 NOTE — ED Notes (Signed)
Pt 170/110/BP  Pt reports dizziness and blurred vision while driving this morning. Pt denies nausea/vomiting/pain at this time.  Pt reports nonproductive cough.  Pt reports similar symptoms in last 2-3 days.  Pt report feeling flushed in last 2-3 days.  Pt with BP= 170/110 by EMS. Per EMS report, pt with orthostatic changes.  Pt with pacemaker placed in August 2012.  Pt's pacemaker is et at 60bpm.

## 2011-11-16 NOTE — Consult Note (Signed)
Reason for Consult: Vertigo   CC: Vertigo and dizziness  HPI:    Paul Paul is an 55 y.o. male  has a past medical history of HYPERTENSION (12/10/2007); Diabetes mellitus; Pacemaker; and Bradycardia. He was at baseline this AM.  As he was driving his truck at 1:47 he noted a sudden onset of the road "moving left to right to left again" which was associated with blurred vision.  No vertiginous sensation, no weakness, dysarthria, numbness or tingling.  He states he felt no palpitations.  A friend took his BP which read 180/110 which is abnormal for him as he usually sit around 140/80. He has completely resolved.  Past Medical History  Diagnosis Date  . HYPERTENSION 12/10/2007  . Diabetes mellitus     now under control  . Pacemaker   . Bradycardia     Past Surgical History  Procedure Date  . Cardiac catheterization     normal  . Septal deviation   . Knee surgery     left; arthoscopic    Family History  Problem Relation Age of Onset  . Heart attack Mother   . Heart disease Mother   . Diabetes Mother   . Heart attack Father   . Heart disease Father     Social History:  reports that he quit smoking about 30 years ago. He does not have any smokeless tobacco history on file. He reports that he does not drink alcohol or use illicit drugs.  Allergies  Allergen Reactions  . Lisinopril     REACTION: rash  . Losartan Potassium-Hctz     REACTION: dizziness, fatigue  . Prednisone Other (See Comments)    Raises blood sugar  . Doxycycline Rash    Medications:    Prior to Admission:  ASA 81 mg q day Scheduled:    Review of Systems - General ROS: negative for - chills, fatigue, fever or hot flashes Hematological and Lymphatic ROS: negative for - bruising, fatigue, jaundice or pallor Endocrine ROS: negative for - hair pattern changes, hot flashes, mood swings or skin changes Respiratory ROS: negative for - cough, hemoptysis, orthopnea or wheezing Cardiovascular ROS: negative  for - dyspnea on exertion, orthopnea, palpitations or shortness of breath Gastrointestinal ROS: negative for - abdominal pain, appetite loss, blood in stools, diarrhea or hematemesis Musculoskeletal ROS: negative for - joint pain, joint stiffness, joint swelling or muscle pain Neurological ROS: Please see HPI Dermatological ROS: negative for dry skin, pruritus and rash   Blood pressure 141/89, pulse 68, temperature 98.1 F (36.7 C), temperature source Oral, resp. rate 18, SpO2 97.00%.   Neurologic Examination:   Mental Status: Alert, oriented, thought content appropriate.  Speech fluent without evidence of aphasia. Able to follow 3 step commands without difficulty. Cranial Nerves: II-Visual fields grossly intact. III/IV/VI-Extraocular movements intact.  Pupils reactive bilaterally. V/VII-Smile asymmetric left facial droop from previous bells palsey VIII-grossly intact IX/X-normal gag XI-bilateral shoulder shrug XII-midline tongue extension Motor: 5/5 bilaterally with normal tone and bulk except his Left arm which shows weakness in abduction and external rotation (this is from a fall and he is seeing a orthopedic MD for possible RTC tear) Sensory: Pinprick and light touch intact throughout, bilaterally Deep Tendon Reflexes: 2+ and symmetric throughout Plantars: Downgoing bilaterally Cerebellar: Normal finger-to-nose, normal rapid alternating movements and normal heel-to-shin test.  Normal gait and station.     Lab Results  Component Value Date/Time   CHOL 190 10/27/2011  8:05 AM    Results for orders placed  during the hospital encounter of 11/16/11 (from the past 48 hour(s))  CBC     Status: Normal   Collection Time   11/16/11  9:08 AM      Component Value Range Comment   WBC 7.1  4.0 - 10.5 (K/uL)    RBC 5.19  4.22 - 5.81 (MIL/uL)    Hemoglobin 16.1  13.0 - 17.0 (g/dL)    HCT 16.1  09.6 - 04.5 (%)    MCV 88.8  78.0 - 100.0 (fL)    MCH 31.0  26.0 - 34.0 (pg)    MCHC 34.9   30.0 - 36.0 (g/dL)    RDW 40.9  81.1 - 91.4 (%)    Platelets 253  150 - 400 (K/uL)   DIFFERENTIAL     Status: Normal   Collection Time   11/16/11  9:08 AM      Component Value Range Comment   Neutrophils Relative 56  43 - 77 (%)    Neutro Abs 4.0  1.7 - 7.7 (K/uL)    Lymphocytes Relative 33  12 - 46 (%)    Lymphs Abs 2.3  0.7 - 4.0 (K/uL)    Monocytes Relative 8  3 - 12 (%)    Monocytes Absolute 0.6  0.1 - 1.0 (K/uL)    Eosinophils Relative 2  0 - 5 (%)    Eosinophils Absolute 0.2  0.0 - 0.7 (K/uL)    Basophils Relative 1  0 - 1 (%)    Basophils Absolute 0.1  0.0 - 0.1 (K/uL)   TROPONIN I     Status: Normal   Collection Time   11/16/11  9:08 AM      Component Value Range Comment   Troponin I <0.30  <0.30 (ng/mL)   POCT I-STAT, CHEM 8     Status: Abnormal   Collection Time   11/16/11  9:29 AM      Component Value Range Comment   Sodium 141  135 - 145 (mEq/L)    Potassium 4.2  3.5 - 5.1 (mEq/L)    Chloride 106  96 - 112 (mEq/L)    BUN 13  6 - 23 (mg/dL)    Creatinine, Ser 7.82  0.50 - 1.35 (mg/dL)    Glucose, Bld 956 (*) 70 - 99 (mg/dL)    Calcium, Ion 2.13  1.12 - 1.32 (mmol/L)    TCO2 25  0 - 100 (mmol/L)    Hemoglobin 16.3  13.0 - 17.0 (g/dL)    HCT 08.6  57.8 - 46.9 (%)     Dg Chest 2 View  11/16/2011  *RADIOLOGY REPORT*  Clinical Data: Dizziness, bradycardia, pacemaker  CHEST - 2 VIEW  Comparison: October 06, 2011  Findings: The cardiac silhouette and mediastinum are within normal limits.  There is mild pulmonary vascular cephalization without frank edema.  No focal infiltrates or effusions are identified. Mild chronic interstitial fibrotic changes are present.  The pacemaker leads are stable in position and appearance over the right atrium and right ventricle.  IMPRESSION: Pulmonary vascular cephalization without frank edema.  Chronic bronchitic changes.  Original Report Authenticated By: Brandon Melnick, M.D.   Ct Head Wo Contrast  11/16/2011  *RADIOLOGY REPORT*   Clinical Data: Dizziness, blurred vision.  CT HEAD WITHOUT CONTRAST  Technique:  Contiguous axial images were obtained from the base of the skull through the vertex without contrast.  Comparison: 11/05/2008  Findings: There is within the left MCA appears slightly dense compared to the right MCA. This  is a stable finding when compared to study from 2009 and therefore not felt to represent acute thrombus. No acute intracranial abnormality.  Specifically, no hemorrhage, hydrocephalus, mass lesion, acute infarction, or significant intracranial injury.  No acute calvarial abnormality. Visualized paranasal sinuses and mastoids clear.  Orbital soft tissues unremarkable.  Visualized paranasal sinuses and mastoids clear.  Orbital soft tissues unremarkable.  IMPRESSION: No acute intracranial abnormality.  Original Report Authenticated By: Cyndie Chime, M.D.     Assessment/Plan:   55 YO male with transient period of visual abnormalities as above in HPI.  His symptoms have completely resolved.  Ddx included TIA versus cardiogenic source.  To further evaluate neurological etiology recommend CTA head and neck to evaluate intracranial and neck vascularity. If these are normal no further recommendations from a neurological stand point.   Agree with cardiology consultation.    Felicie Morn PA-C [facility] [phone]  11/16/2011, 11:13 AM    Addendum: Malvin Johns and examined the patient. No findings of focal neurological deficit. Likely transiently vertiginous due to blood pressure spike. No MRI due to pacer. Neurosurgery will see as outpatient for aneurism, which has been stable for 6 years.   Carmell Austria, MD

## 2011-11-16 NOTE — ED Provider Notes (Addendum)
History     CSN: [id number] Arrival date & time: 11/16/2011  7:48 AM   First MD Initiated Contact with Patient 11/16/11 [phone]      Chief Complaint  Patient presents with  . Dizziness  . Blurred Vision    (Consider location/radiation/quality/duration/timing/severity/associated sxs/prior treatment) The history is provided by the patient.   patient states that he was driving on the road began to feel as if the road was going side to side. He felt a little dizzy with it. He states his vision is also a little blurred. His blood pressure was elevated at that time. He is felt as if he has had a cold for the last few days. Cough without production. Also been a little flushed. He had a pacemaker placed in August. He said previous episodes of dizziness but he states these all were spinning, he states this is as if it was going side to side. No numbness or weakness. He felt lightheaded when he stood up after the event. No chest pain. No localized numbness or weakness. No diarrhea. No nausea or vomiting.  Past Medical History  Diagnosis Date  . HYPERTENSION 12/10/2007  . Diabetes mellitus     now under control  . Pacemaker   . Bradycardia     Past Surgical History  Procedure Date  . Cardiac catheterization     normal  . Septal deviation   . Knee surgery     left; arthoscopic    Family History  Problem Relation Age of Onset  . Heart attack Mother   . Heart disease Mother   . Diabetes Mother   . Heart attack Father   . Heart disease Father     History  Substance Use Topics  . Smoking status: Former Smoker    Quit date: 01/13/1981  . Smokeless tobacco: Not on file  . Alcohol Use: No      Review of Systems  Constitutional: Negative for activity change and appetite change.  HENT: Negative for neck stiffness.   Eyes: Positive for visual disturbance. Negative for photophobia and pain.  Respiratory: Positive for cough. Negative for chest tightness and shortness of breath.     Cardiovascular: Negative for chest pain and leg swelling.  Gastrointestinal: Negative for nausea, vomiting, abdominal pain and diarrhea.  Genitourinary: Negative for flank pain.  Musculoskeletal: Negative for back pain.  Skin: Negative for rash.  Neurological: Positive for dizziness and light-headedness. Negative for weakness, numbness and headaches.  Psychiatric/Behavioral: Negative for behavioral problems.    Allergies  Lisinopril; Losartan potassium-hctz; Prednisone; and Doxycycline  Home Medications   Current Outpatient Rx  Name Route Sig Dispense Refill  . ASPIRIN 81 MG PO TABS Oral Take 81 mg by mouth daily.        BP 165/90  Pulse 70  Temp(Src) 98.1 F (36.7 C) (Oral)  Resp 22  SpO2 95%  Physical Exam  Nursing note and vitals reviewed. Constitutional: He is oriented to person, place, and time. He appears well-developed and well-nourished.  HENT:  Head: Normocephalic and atraumatic.  Eyes: EOM are normal. Pupils are equal, round, and reactive to light.  Neck: Normal range of motion. Neck supple.  Cardiovascular: Normal rate, regular rhythm and normal heart sounds.   No murmur heard. Pulmonary/Chest: Effort normal and breath sounds normal.       Pacemaker left upper chest wall.  Abdominal: Soft. Bowel sounds are normal. He exhibits no distension and no mass. There is no tenderness. There is no rebound and no  guarding.  Musculoskeletal: Normal range of motion. He exhibits no edema.  Neurological: He is alert and oriented to person, place, and time. No cranial nerve deficit.       Nystagmus at end gaze left and right, but worse to right.  Skin: Skin is warm and dry.  Psychiatric: He has a normal mood and affect.    ED Course  Procedures (including critical care time)   Labs Reviewed  CBC  DIFFERENTIAL  I-STAT, CHEM 8  TROPONIN I   No results found.   No diagnosis found.   Date: 11/16/2011  Rate: 72  Rhythm: normal sinus rhythm  QRS Axis: normal   Intervals: normal  ST/T Wave abnormalities: normal  Conduction Disutrbances:none  Narrative Interpretation:   Old EKG Reviewed: unchanged    MDM  Patient presented with the feeling of the Road was waiting back and forth. Previous history of bradycardia that required a pacemaker. He states that time is having a feeling like the room was spinning. His pacemaker was interrogated he was seen by cardiology. She's not appear to be cardiac cause. His previous MRA showed some posterior circulation findings. He was seen by neurology and they recommended a CTA. There is some findings on the CTA which will be evaluated by neurology.        Juliet Rude. Rubin Payor, MD 11/16/11 1534  Juliet Rude. Rubin Payor, MD 11/16/11 [phone]

## 2011-11-16 NOTE — ED Notes (Signed)
Patient gone to [place], Gave family members each a can of coke.

## 2011-11-16 NOTE — ED Notes (Signed)
Dr Lyman Speller paged to [phone]

## 2011-11-16 NOTE — ED Notes (Signed)
Move to CDU

## 2011-11-16 NOTE — ED Notes (Signed)
Pt continues to endorse blurred vision in both eyes at this time.  Pt states, "I was driving and everything seemed to sway from side to side; at first I thought it was the car; then everything seemed more blurry".  Pt continued to deny pain/nausea/vomiting. Pt denies any numbness/tingling/weakness.

## 2011-11-16 NOTE — ED Notes (Signed)
Attempted 20g start for IV, unsuccessful. IV team informed of attempted start.

## 2011-11-18 NOTE — ED Provider Notes (Signed)
Medical screening examination/treatment/procedure(s) were conducted as a shared visit with non-physician practitioner(s) and myself.  I personally evaluated the patient during the encounter  Paul Paul. Rubin Payor, MD 11/18/11 [phone]

## 2011-11-20 ENCOUNTER — Ambulatory Visit (HOSPITAL_COMMUNITY)
Admission: RE | Admit: 2011-11-20 | Discharge: 2011-11-20 | Disposition: A | Discharge status: Home / Self Care | Payer: Managed Care, Other (non HMO) | Source: Ambulatory Visit | Attending: Emergency Medicine | Admitting: Emergency Medicine

## 2011-11-20 ENCOUNTER — Other Ambulatory Visit (HOSPITAL_COMMUNITY): Payer: Self-pay | Admitting: Emergency Medicine

## 2011-11-20 DIAGNOSIS — I729 Aneurysm of unspecified site: Secondary | ICD-10-CM

## 2011-11-21 NOTE — Progress Notes (Signed)
11/21/11:  Spoke at length personally with patient's son, Keane Scrape via phone in regards to intracranial treatment of his father's (patient Paul Paul) cerebral aneurysm.  The son has chosen not to see a neurosurgeon against our recommendations prior to scheduling treatment.  He was notified that it was usually protocol for our elective patients to see a neurosurgeon prior to treatment in the event of a complication.  He was made aware that if he elects not to see a neurosurgeon prior to coiling - and we have an unexpected complication whereas a neurosurgeon was needed to assist, then we would have to go with whichever neurosurgeon is on call for that day to assist.  He verbalized his understanding of this and again voiced his choosing to proceed without first seeing a neurosurgeon.  Dr. Corliss Skains was made aware of the decision.

## 2011-11-22 ENCOUNTER — Other Ambulatory Visit: Payer: Self-pay | Admitting: Neurology

## 2011-11-22 ENCOUNTER — Other Ambulatory Visit (HOSPITAL_COMMUNITY): Payer: Self-pay | Admitting: Interventional Radiology

## 2011-11-22 DIAGNOSIS — I729 Aneurysm of unspecified site: Secondary | ICD-10-CM

## 2011-11-23 ENCOUNTER — Encounter (HOSPITAL_COMMUNITY): Payer: Self-pay | Admitting: Pharmacy Technician

## 2011-11-23 ENCOUNTER — Telehealth: Payer: Self-pay | Admitting: Internal Medicine

## 2011-11-23 MED ORDER — METOPROLOL SUCCINATE ER 25 MG PO TB24: 25.0000 mg | ORAL_TABLET | Freq: Every day | ORAL | Status: DC

## 2011-11-23 NOTE — Telephone Encounter (Addendum)
Pt was given rx for BP in August never filled it and lost the rx, wants to get it called in to [facility] asap pt having surgery for a brain aneurysm ans BP running high, pt's wife [phone] if any questions   Patient's wife called back requesting medication as BP 160/100.  No dizziness/HA/syncope.  I have combed through the records and he was prescribed Toprol XL 25 mg daily.  [facility].  The wife was very appreciative.  If Dr. Ladona Ridgel would like to change medications I have told them he will call.    Robbi Garter, [facility] 7:11 PM

## 2011-11-24 ENCOUNTER — Encounter (HOSPITAL_COMMUNITY): Payer: Self-pay

## 2011-11-24 ENCOUNTER — Encounter (HOSPITAL_COMMUNITY)
Admission: RE | Admit: 2011-11-24 | Discharge: 2011-11-24 | Disposition: A | Discharge status: Home / Self Care | Payer: Managed Care, Other (non HMO) | Source: Ambulatory Visit | Attending: Rheumatology | Admitting: Rheumatology

## 2011-11-24 ENCOUNTER — Ambulatory Visit: Payer: Managed Care, Other (non HMO) | Admitting: Internal Medicine

## 2011-11-24 HISTORY — DX: Pain in unspecified shoulder: M25.519

## 2011-11-24 HISTORY — DX: Sleep apnea, unspecified: G47.30

## 2011-11-24 HISTORY — DX: Unspecified osteoarthritis, unspecified site: M19.90

## 2011-11-24 HISTORY — DX: Cardiac arrhythmia, unspecified: I49.9

## 2011-11-24 LAB — CBC
HCT: 45.9 % (ref 39.0–52.0)
Hemoglobin: 16.4 g/dL (ref 13.0–17.0)
RBC: 5.23 MIL/uL (ref 4.22–5.81)
RDW: 13 % (ref 11.5–15.5)
WBC: 6.9 10*3/uL (ref 4.0–10.5)

## 2011-11-24 LAB — DIFFERENTIAL
Basophils Relative: 1 % (ref 0–1)
Eosinophils Absolute: 0.2 10*3/uL (ref 0.0–0.7)
Monocytes Relative: 9 % (ref 3–12)
Neutrophils Relative %: 45 % (ref 43–77)

## 2011-11-24 LAB — COMPREHENSIVE METABOLIC PANEL
Albumin: 3.6 g/dL (ref 3.5–5.2)
Alkaline Phosphatase: 47 U/L (ref 39–117)
BUN: 15 mg/dL (ref 6–23)
CO2: 24 mEq/L (ref 19–32)
Chloride: 101 mEq/L (ref 96–112)
Glucose, Bld: 111 mg/dL — ABNORMAL HIGH (ref 70–99)
Potassium: 4.1 mEq/L (ref 3.5–5.1)
Total Bilirubin: 0.5 mg/dL (ref 0.3–1.2)

## 2011-11-24 LAB — APTT: aPTT: 29 seconds (ref 24–37)

## 2011-11-24 LAB — PROTIME-INR: INR: 1.01 (ref 0.00–1.49)

## 2011-11-24 NOTE — Pre-Procedure Instructions (Signed)
[name]  11/24/2011   Your procedure is scheduled on:  THURS 12/27 12  Report to [facility] at0900AM.  Call this number if you have problems the morning of surgery: [phone]   Remember:   Do not eat food:After Midnight.  May have clear liquids: up to 4 Hours before arrival.  Clear liquids include soda, tea, black coffee, apple or grape juice, broth.  Take these medicines the morning of surgery with A SIP OF WATER: ASPIRIN,PLAVIX,METOPROLOL  Do not wear jewelry, make-up or nail polish.  Do not wear lotions, powders, or perfumes. You may wear deodorant.  Do not shave 48 hours prior to surgery.  Do not bring valuables to the hospital.  Contacts, dentures or bridgework may not be worn into surgery.  Leave suitcase in the car. After surgery it may be brought to your room.  For patients admitted to the hospital, checkout time is 11:00 AM the day of discharge.   Patients discharged the day of surgery will not be allowed to drive home.  Name and phone number of your driver: [name]  Special Instructions: CHG Shower Use Special Wash: 1/2 bottle night before surgery and 1/2 bottle morning of surgery.   Please read over the following fact sheets that you were given: Coughing and Deep Breathing  

## 2011-11-24 NOTE — Consult Note (Signed)
Anesthesia:  Patient is a 55 year old male scheduled for an endovascular stenting and/or coiling for a MCA aneurysm by Dr. Corliss Skains.  His history includes HTN, DM2, OSA, OA, bradycardia with syncope s/p dual chamber [employer]  PPM (07/05/11) and former smoker.   His preoperative labs show mildly elevated AST/ALT at 40/63, but coags and PLT count are WNL.  Glucose is 111.  His CXR from 11/16/11 showed pulmonary vascular cephalization without frank edema. Chronic bronchitic changes.  He had PFTs done on 11/09/11 at [facility] (see Media tab) showing spirometry WNL.   His last EKG on 11/16/11 showed SR, borderline LAD, consider anterior infarct.  SR had replaced his a-paced rhythm from 07/06/11.  He was actually evaluated by Dr. Johney Frame that day in the ED when he presented with vertigo and visual changes, and ultimately had a Neurology evaluation with finding of a cerebral aneurysm.   Dr. Ladona Ridgel is his primary EP Cardiologist.  He had an echocardiogram and cardiac cath (under Notes tab) on 07/04/11.  The echo showed normal LV systolic function, EF 55-65%.  His cath showed:done on 07/04/11 showing: 1. Significant disease in first diagonal branch at the ostium which was described on previous cardiac catheterization. Otherwise, there is mild coronary artery disease.  2. Normal LV systolic function.  3. No revascularization is advised. Recommend medical therapy.  Proceed with permanent pacemaker placement per Electrophysiology (done 07/05/11).  Aggressive treatment of risk factors is recommended for his mild coronary artery disease.  Plan to proceed.  His chart was placed in the follow-up cabinet, as we are waiting for his perioperative prescription for implanted device form to be completed.

## 2011-11-24 NOTE — Pre-Procedure Instructions (Signed)
[name]  11/24/2011   Your procedure is scheduled on:  THURS 12/27 12  Report to [facility] [id number].  Call this number if you have problems the morning of surgery: [phone]   Remember:   Do not eat food:After Midnight.  May have clear liquids: up to 4 Hours before arrival.  Clear liquids include soda, tea, black coffee, apple or grape juice, broth.  Take these medicines the morning of surgery with A SIP OF WATER: ASPIRIN,PLAVIX,METOPROLOL  Do not wear jewelry, make-up or nail polish.  Do not wear lotions, powders, or perfumes. You may wear deodorant.  Do not shave 48 hours prior to surgery.  Do not bring valuables to the hospital.  Contacts, dentures or bridgework may not be worn into surgery.  Leave suitcase in the car. After surgery it may be brought to your room.  For patients admitted to the hospital, checkout time is 11:00 AM the day of discharge.   Patients discharged the day of surgery will not be allowed to drive home.  Name and phone number of your driver: [name]  Special Instructions: CHG Shower Use Special Wash: 1/2 bottle night before surgery and 1/2 bottle morning of surgery.   Please read over the following fact sheets that you were given: Coughing and Deep Breathing

## 2011-11-30 ENCOUNTER — Encounter (HOSPITAL_COMMUNITY): Payer: Self-pay | Admitting: Vascular Surgery

## 2011-11-30 ENCOUNTER — Ambulatory Visit (HOSPITAL_COMMUNITY)
Admission: RE | Admit: 2011-11-30 | Discharge: 2011-11-30 | Disposition: A | Discharge status: Home / Self Care | Payer: Managed Care, Other (non HMO) | Source: Ambulatory Visit | Attending: Interventional Radiology | Admitting: Interventional Radiology

## 2011-11-30 ENCOUNTER — Encounter (HOSPITAL_COMMUNITY): Payer: Self-pay

## 2011-11-30 ENCOUNTER — Other Ambulatory Visit (HOSPITAL_COMMUNITY): Payer: Self-pay | Admitting: Interventional Radiology

## 2011-11-30 VITALS — BP 135/78 | HR 68 | Temp 97.3°F | Resp 18

## 2011-11-30 DIAGNOSIS — Z01812 Encounter for preprocedural laboratory examination: Secondary | ICD-10-CM | POA: Insufficient documentation

## 2011-11-30 DIAGNOSIS — I729 Aneurysm of unspecified site: Secondary | ICD-10-CM

## 2011-11-30 DIAGNOSIS — R42 Dizziness and giddiness: Secondary | ICD-10-CM | POA: Insufficient documentation

## 2011-11-30 DIAGNOSIS — R209 Unspecified disturbances of skin sensation: Secondary | ICD-10-CM | POA: Insufficient documentation

## 2011-11-30 DIAGNOSIS — I671 Cerebral aneurysm, nonruptured: Secondary | ICD-10-CM | POA: Insufficient documentation

## 2011-11-30 MED ORDER — IODIXANOL 320 MG/ML IV SOLN
100.0000 mL | Freq: Once | INTRAVENOUS | Status: AC | PRN
  Administered 2011-11-30: 20 mL via INTRAVENOUS

## 2011-11-30 MED ORDER — MEPERIDINE HCL 25 MG/ML IJ SOLN: 6.2500 mg | INTRAMUSCULAR | Status: DC | PRN

## 2011-11-30 MED ORDER — PROMETHAZINE HCL 25 MG/ML IJ SOLN: 6.2500 mg | INTRAMUSCULAR | Status: DC | PRN

## 2011-11-30 MED ORDER — IOHEXOL 300 MG/ML  SOLN
200.0000 mL | Freq: Once | INTRAMUSCULAR | Status: AC | PRN
  Administered 2011-11-30: 70 mL via INTRAVENOUS

## 2011-11-30 MED ORDER — FENTANYL CITRATE 0.05 MG/ML IJ SOLN
INTRAMUSCULAR | Status: DC | PRN
  Administered 2011-11-30 (×2): 25 ug via INTRAVENOUS

## 2011-11-30 MED ORDER — LACTATED RINGERS IV SOLN: INTRAVENOUS | Status: DC

## 2011-11-30 MED ORDER — SODIUM CHLORIDE 0.9 % IV SOLN: INTRAVENOUS | Status: AC

## 2011-11-30 MED ORDER — LABETALOL HCL 5 MG/ML IV SOLN
INTRAVENOUS | Status: DC | PRN
  Administered 2011-11-30: 5 mg via INTRAVENOUS

## 2011-11-30 MED ORDER — LACTATED RINGERS IV SOLN
INTRAVENOUS | Status: DC | PRN
  Administered 2011-11-30: 10:00:00 via INTRAVENOUS

## 2011-11-30 MED ORDER — HYDROMORPHONE HCL PF 1 MG/ML IJ SOLN: 0.2500 mg | INTRAMUSCULAR | Status: DC | PRN

## 2011-11-30 MED ORDER — MIDAZOLAM HCL 5 MG/5ML IJ SOLN
INTRAMUSCULAR | Status: DC | PRN
  Administered 2011-11-30: 1 mg via INTRAVENOUS

## 2011-11-30 NOTE — H&P (Signed)
Paul Paul is an 55 y.o. male.   Chief Complaint: Cerebral aneurysm of MCA known since 2006. Patient with transient headaches and recent LOC w/p pacemaker placement.    HPI: HTN better controlled.  No new deficits or changes in ROS since seen by neurologists. See neurology note below : Reason for Consult: Vertigo   CC: Vertigo and dizziness    HPI:     Paul Paul is an 55 y.o. male  has a past medical history of HYPERTENSION (12/10/2007); Diabetes mellitus; Pacemaker; and Bradycardia. He was at baseline this AM. 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  No vertiginous sensation, no weakness, dysarthria, numbness or tingling.  He states he felt no palpitations.  A friend took his BP which read 180/110 which is abnormal for him as he usually sit around 140/80. He has completely resolved.    Past Medical History   Diagnosis  Date   .  HYPERTENSION  12/10/2007   .  Diabetes mellitus         now under control   .  Pacemaker     .  Bradycardia         Past Surgical History   Procedure  Date   .  Cardiac catheterization         normal   .  Septal deviation     .  Knee surgery         left; arthoscopic       Family History   Problem  Relation  Age of Onset   .  Heart attack  Mother     .  Heart disease  Mother     .  Diabetes  Mother     .  Heart attack  Father     .  Heart disease  Father       Social History: reports that he quit smoking about 30 years ago. He does not have any smokeless tobacco history on file. He reports that he does not drink alcohol or use illicit drugs.    Allergies   Allergen  Reactions   .  Lisinopril         REACTION: rash   .  Losartan Potassium-Hctz         REACTION: dizziness, fatigue   .  Prednisone  Other (See Comments)       Raises blood sugar   .  Doxycycline  Rash       Medications:     Prior to Admission:   ASA 81 mg q  day Scheduled:     Review of Systems - General ROS: negative for - chills, fatigue, fever or hot flashes Hematological and Lymphatic ROS: negative for - bruising, fatigue, jaundice or pallor Endocrine ROS: negative for - hair pattern changes, hot flashes, mood swings or skin changes Respiratory ROS: negative for - cough, hemoptysis, orthopnea or wheezing Cardiovascular ROS: negative for - dyspnea on exertion, orthopnea, palpitations or shortness of breath Gastrointestinal ROS: negative for - abdominal pain, appetite loss, blood in stools, diarrhea or hematemesis Musculoskeletal ROS: negative for - joint pain, joint stiffness, joint swelling or muscle pain Neurological ROS: Please see HPI Dermatological ROS: negative for dry skin, pruritus and rash   Blood pressure 141/89, pulse 68, temperature 98.1 F (36.7 C), temperature source Oral, resp. rate 18, SpO2 97.00%.  Neurologic Examination:      Mental Status:  Alert, oriented, thought content appropriate.  Speech fluent without evidence of aphasia. Able to follow 3 step commands without difficulty. Cranial Nerves: II-Visual fields grossly intact. III/IV/VI-Extraocular movements intact.  Pupils reactive bilaterally. V/VII-Smile asymmetric left facial droop from previous bells palsey VIII-grossly intact IX/X-normal gag XI-bilateral shoulder shrug XII-midline tongue extension Motor: 5/5 bilaterally with normal tone and bulk except his Left arm which shows weakness in abduction and external rotation (this is from a fall and he is seeing a orthopedic MD for possible RTC tear) Sensory: Pinprick and light touch intact throughout, bilaterally Deep Tendon Reflexes: 2+ and symmetric throughout Plantars: Downgoing bilaterally Cerebellar: Normal finger-to-nose, normal rapid alternating movements and normal heel-to-shin test.  Normal gait and station.        Lab Results   Component  Value  Date/Time     CHOL  190  10/27/2011  8:05 AM        Results for orders placed during the hospital encounter of 11/16/11 (from the past 48 hour(s))   CBC     Status: Normal     Collection Time     11/16/11  9:08 AM       Component  Value  Range  Comment     WBC  7.1   4.0 - 10.5 (K/uL)       RBC  5.19   4.22 - 5.81 (MIL/uL)       Hemoglobin  16.1   13.0 - 17.0 (g/dL)       HCT  59.5   63.8 - 52.0 (%)       MCV  88.8   78.0 - 100.0 (fL)       MCH  31.0   26.0 - 34.0 (pg)       MCHC  34.9   30.0 - 36.0 (g/dL)       RDW  75.6   43.3 - 15.5 (%)       Platelets  253   150 - 400 (K/uL)     DIFFERENTIAL     Status: Normal     Collection Time     11/16/11  9:08 AM       Component  Value  Range  Comment     Neutrophils Relative  56   43 - 77 (%)       Neutro Abs  4.0   1.7 - 7.7 (K/uL)       Lymphocytes Relative  33   12 - 46 (%)       Lymphs Abs  2.3   0.7 - 4.0 (K/uL)       Monocytes Relative  8   3 - 12 (%)       Monocytes Absolute  0.6   0.1 - 1.0 (K/uL)       Eosinophils Relative  2   0 - 5 (%)       Eosinophils Absolute  0.2   0.0 - 0.7 (K/uL)       Basophils Relative  1   0 - 1 (%)       Basophils Absolute  0.1   0.0 - 0.1 (K/uL)     TROPONIN I     Status: Normal     Collection Time     11/16/11  9:08 AM       Component  Value  Range  Comment     Troponin I  <0.30   <0.30 (ng/mL)  POCT I-STAT, CHEM 8     Status: Abnormal     Collection Time     11/16/11  9:29 AM       Component  Value  Range  Comment     Sodium  141   135 - 145 (mEq/L)       Potassium  4.2   3.5 - 5.1 (mEq/L)       Chloride  106   96 - 112 (mEq/L)       BUN  13   6 - 23 (mg/dL)       Creatinine, Ser  0.90   0.50 - 1.35 (mg/dL)       Glucose, Bld  161 (*)  70 - 99 (mg/dL)       Calcium, Ion  0.96   1.12 - 1.32 (mmol/L)       TCO2  25   0 - 100 (mmol/L)       Hemoglobin  16.3   13.0 - 17.0 (g/dL)       HCT  04.5   40.9 - 52.0 (%)           Assessment/Plan:    55 YO male with transient period of visual abnormalities as above in HPI.  His symptoms  have completely resolved.  Ddx included TIA versus cardiogenic source.  To further evaluate neurological etiology recommend CTA head and neck to evaluate intracranial and neck vascularity. If these are normal no further recommendations from a neurological stand point.    Agree with cardiology consultation.    Felicie Morn PA-C [facility] [phone]  11/16/2011, 11:13 AM     Addendum: Malvin Johns and examined the patient. No findings of focal neurological deficit. Likely transiently vertiginous due to blood pressure spike. No MRI due to pacer. Neurosurgery will see as outpatient for aneurism, which has been stable for 6 years.   Carmell Austria, MD Revision History...    Date/Time User Action  11/16/2011 4:27 PM Carmell Austria Sign  11/16/2011 11:55 AM Vania Rea. Katrinka Blazing, [place] Sign  View Details Report      11/30/11 examination prior to angiogram and possible endovascular coiling, stenting.     Past Medical History  Diagnosis Date  . HYPERTENSION 12/10/2007  . Diabetes mellitus     now under control  . Pacemaker   . Bradycardia   . Dysrhythmia     BRADYCARDIA REQUIRING PACEMAKER .FOLLOWED BY GREGG TAYLOR  . Sleep apnea     CPAP.NOT WEARING NOW.NEEDS REPLACEMENT PIECE  . Arthritis   . Shoulder pain     L SHOULDER. RECENT FALL    Past Surgical History  Procedure Date  . Cardiac catheterization     normal  . Septal deviation   . Knee surgery     left; arthoscopic  . Insert / replace / remove pacemaker     Family History  Problem Relation Age of Onset  . Heart attack Mother   . Heart disease Mother   . Diabetes Mother   . Heart attack Father   . Heart disease Father    Social History:  reports that he quit smoking about 4 months ago. He does not have any smokeless tobacco history on file. He reports that he does not drink alcohol or use illicit drugs.  Allergies:  Allergies  Allergen Reactions  . Lisinopril     REACTION: rash  . Losartan Potassium-Hctz      REACTION: dizziness, fatigue  . Prednisone Other (See Comments)  Raises blood sugar  . Doxycycline Rash    Medications Prior to Admission  Medication Sig Dispense Refill  . metoprolol succinate (TOPROL XL) 25 MG 24 hr tablet Take 1 tablet (25 mg total) by mouth daily.  30 tablet  6  . aspirin 81 MG chewable tablet Chew 81 mg by mouth daily.         No current facility-administered medications on file as of 11/30/2011.   **Verfied with patient that he has taken his plavix last 3 days along with aspirin as instructed.   Anesthesia consult :  Anesthesia:  Patient is a 56 year old male scheduled for an endovascular stenting and/or coiling for a MCA aneurysm by Dr. Corliss Skains.  His history includes HTN, DM2, OSA, OA, bradycardia with syncope s/p dual chamber [employer]. Jude  PPM (07/05/11) and former smoker.   His preoperative labs show mildly elevated AST/ALT at 40/63, but coags and PLT count are WNL.  Glucose is 111.  His CXR from 11/16/11 showed pulmonary vascular cephalization without frank edema. Chronic bronchitic changes.  He had PFTs done on 11/09/11 at [facility] (see Media tab) showing spirometry WNL.    His last EKG on 11/16/11 showed SR, borderline LAD, consider anterior infarct.  SR had replaced his a-paced rhythm from 07/06/11.  He was actually evaluated by Dr. Johney Frame that day in the ED when he presented with vertigo and visual changes, and ultimately had a Neurology evaluation with finding of a cerebral aneurysm.   Dr. Ladona Ridgel is his primary EP Cardiologist.  He had an echocardiogram and cardiac cath (under Notes tab) on 07/04/11.  The echo showed normal LV systolic function, EF 55-65%.  His cath showed:done on 07/04/11 showing: 1. Significant disease in first diagonal branch at the ostium which was described on previous cardiac catheterization. Otherwise, there is mild coronary artery disease.   2. Normal LV systolic function.   3. No revascularization is advised. Recommend medical therapy.     Proceed with permanent pacemaker placement per Electrophysiology (done 07/05/11).   Aggressive treatment of risk factors is recommended for his mild coronary artery disease.  Plan to proceed.  His chart was placed in the follow-up cabinet, as we are waiting for his perioperative prescription for implanted device form to be completed.      Review of Systems  Constitutional: Negative for fever, chills and weight loss.  HENT:       Slight left facial droop from previous bells' palsy   Eyes: Positive for blurred vision.  Respiratory: Negative for cough, shortness of breath and wheezing.   Cardiovascular: Negative for chest pain, palpitations, orthopnea, claudication and leg swelling.  Skin: Negative.   Neurological: Positive for dizziness, focal weakness, loss of consciousness and headaches.       Some weakness of left shoulder - possible RTC tear.   Psychiatric/Behavioral: Negative for depression and memory loss. The patient is not nervous/anxious.     Blood pressure 167/94, pulse 60, temperature 97.9 F (36.6 C), temperature source Oral, resp. rate 20, SpO2 95.00%. Physical Exam  Constitutional: He is oriented to person, place, and time. He appears well-developed and well-nourished.  HENT:  Head: Normocephalic and atraumatic.       Slight left facial droop. Clear fluent speech   Eyes: EOM are normal. Pupils are equal, round, and reactive to light. Right eye exhibits no discharge.  Neck: No JVD present.  Cardiovascular: Normal rate and regular rhythm.  Exam reveals no gallop and no friction rub.   No  murmur heard. Respiratory: Effort normal and breath sounds normal. No stridor. He has no wheezes.  GI: Soft. Bowel sounds are normal. He exhibits no distension. There is no tenderness.  Musculoskeletal: Normal range of motion. He exhibits no edema and no tenderness.  Neurological: He is alert and oriented to person, place, and time. No cranial nerve deficit.  Skin: Skin is warm and dry.  No erythema.  Psychiatric: He has a normal mood and affect. His behavior is normal. Judgment and thought content normal.     Assessment/Plan Patient was seen in consultation with Dr. Corliss Skains on 11/20/2011.  Procedure for cerebral angiogram with possible coiling/stenting of MCA aneurysm discussed in full detail with all patient's and family questions answered.  Patient has consented to all above.    Kanijah Groseclose D 11/30/2011, 9:41 AM

## 2011-11-30 NOTE — Preoperative (Signed)
Beta Blockers   Reason not to administer Beta Blockers:Not Applicable 

## 2011-11-30 NOTE — Anesthesia Postprocedure Evaluation (Signed)
  Anesthesia Post-op Note  Patient: Paul Paul  Procedure(s) Performed: * No procedures listed *  Patient Location: PACU  Anesthesia Type: MAC  Level of Consciousness: awake  Airway and Oxygen Therapy: Patient Spontanous Breathing  Post-op Pain: none  Post-op Assessment: Post-op Vital signs reviewed  Post-op Vital Signs: stable  Complications: No apparent anesthesia complications

## 2011-11-30 NOTE — Anesthesia Preprocedure Evaluation (Addendum)
Anesthesia Evaluation  Patient identified by MRN, date of birth, ID band  Reviewed: Allergy & Precautions, H&P , NPO status , Patient's Chart, lab work & pertinent test results, reviewed documented beta blocker date and time   Airway Mallampati: II  Neck ROM: Full    Dental  (+) Teeth Intact   Pulmonary sleep apnea and Continuous Positive Airway Pressure Ventilation , former smoker clear to auscultation        Cardiovascular hypertension, + dysrhythmias + pacemaker Regular Normal    Neuro/Psych    GI/Hepatic   Endo/Other  Diabetes mellitus-, Well Controlled  Renal/GU      Musculoskeletal  (+) Arthritis -,   Abdominal (+) obese,   Peds  Hematology   Anesthesia Other Findings   Reproductive/Obstetrics                           Anesthesia Physical Anesthesia Plan  ASA: III  Anesthesia Plan: General   Post-op Pain Management:    Induction: Intravenous  Airway Management Planned: Oral ETT  Additional Equipment: Arterial line  Intra-op Plan:   Post-operative Plan: Extubation in OR  Informed Consent: I have reviewed the patients History and Physical, chart, labs and discussed the procedure including the risks, benefits and alternatives for the proposed anesthesia with the patient or authorized representative who has indicated his/her understanding and acceptance.   Dental advisory given  Plan Discussed with: CRNA and Surgeon  Anesthesia Plan Comments:         Anesthesia Quick Evaluation

## 2011-11-30 NOTE — Procedures (Signed)
S? p 4 vessel arteriogram. RT CFA approach  Preliminary findings. 1.2.68mm Rt MCA aneurysm. 2. 1.19mm rt MCA aneurysm.

## 2011-11-30 NOTE — Transfer of Care (Signed)
Immediate Anesthesia Transfer of Care Note  Patient: Paul Paul  Procedure(s) Performed: * No procedures listed *  Patient Location: PACU  Anesthesia Type: MAC  Level of Consciousness: awake, alert  and oriented  Airway & Oxygen Therapy: Patient Spontanous Breathing  Post-op Assessment: Report given to PACU RN and Post -op Vital signs reviewed and stable  Post vital signs: Reviewed and stable  Complications: No apparent anesthesia complications

## 2011-12-01 LAB — GLUCOSE, CAPILLARY
Glucose-Capillary: 121 mg/dL — ABNORMAL HIGH (ref 70–99)
Glucose-Capillary: 124 mg/dL — ABNORMAL HIGH (ref 70–99)

## 2011-12-01 MED FILL — Cefazolin in D5W Inj 1 GM/50ML: INTRAVENOUS | Qty: 100 | Status: AC

## 2011-12-07 ENCOUNTER — Encounter: Payer: Self-pay | Admitting: Internal Medicine

## 2011-12-08 ENCOUNTER — Encounter: Payer: Self-pay | Admitting: Family

## 2011-12-08 ENCOUNTER — Ambulatory Visit (INDEPENDENT_AMBULATORY_CARE_PROVIDER_SITE_OTHER): Payer: Managed Care, Other (non HMO) | Admitting: Family

## 2011-12-08 VITALS — BP 136/92 | Temp 97.7°F | Wt 299.0 lb

## 2011-12-08 DIAGNOSIS — B372 Candidiasis of skin and nail: Secondary | ICD-10-CM

## 2011-12-08 DIAGNOSIS — F419 Anxiety disorder, unspecified: Secondary | ICD-10-CM

## 2011-12-08 DIAGNOSIS — F411 Generalized anxiety disorder: Secondary | ICD-10-CM

## 2011-12-08 DIAGNOSIS — I10 Essential (primary) hypertension: Secondary | ICD-10-CM

## 2011-12-08 MED ORDER — CLOTRIMAZOLE-BETAMETHASONE 1-0.05 % EX CREA: TOPICAL_CREAM | Freq: Two times a day (BID) | CUTANEOUS | Status: DC

## 2011-12-08 MED ORDER — METOPROLOL SUCCINATE ER 50 MG PO TB24: 50.0000 mg | ORAL_TABLET | Freq: Every day | ORAL | Status: DC

## 2011-12-08 NOTE — Patient Instructions (Addendum)
Yeast Infection of the Skin Some yeast on the skin is normal, but sometimes it causes an infection. If you have a yeast infection, it shows up as white or light brown patches on brown skin. You can see it better in the summer on tan skin. It causes light-colored holes in your suntan. It can happen on any area of the body. This cannot be passed from person to person. HOME CARE  Scrub your skin daily with a dandruff shampoo. Your rash may take a couple weeks to get well.   Do not scratch or itch the rash.  GET HELP RIGHT AWAY IF:   You get another infection from scratching. The skin may get warm, red, and may ooze fluid.   The infection does not seem to be getting better.  MAKE SURE YOU:  Understand these instructions.   Will watch your condition.   Will get help right away if you are not doing well or get worse.  Document Released: 11/02/2008 Document Revised: 08/02/2011 Document Reviewed: 11/02/2008 [facility] Patient Information 2012 [place], [place].  Anxiety and Panic Attacks Your caregiver has informed you that you are having an anxiety or panic attack. There may be many forms of this. Most of the time these attacks come suddenly and without warning. They come at any time of day, including periods of sleep, and at any time of life. They may be strong and unexplained. Although panic attacks are very scary, they are physically harmless. Sometimes the cause of your anxiety is not known. Anxiety is a protective mechanism of the body in its fight or flight mechanism. Most of these perceived danger situations are actually nonphysical situations (such as anxiety over losing a job). CAUSES  The causes of an anxiety or panic attack are many. Panic attacks may occur in otherwise healthy people given a certain set of circumstances. There may be a genetic cause for panic attacks. Some medications may also have anxiety as a side effect. SYMPTOMS  Some of the most common feelings are:  Intense  terror.   Dizziness, feeling faint.   Hot and cold flashes.   Fear of going crazy.   Feelings that nothing is real.   Sweating.   Shaking.   Chest pain or a fast heartbeat (palpitations).   Smothering, choking sensations.   Feelings of impending doom and that death is near.   Tingling of extremities, this may be from over-breathing.   Altered reality (derealization).   Being detached from yourself (depersonalization).  Several symptoms can be present to make up anxiety or panic attacks. DIAGNOSIS  The evaluation by your caregiver will depend on the type of symptoms you are experiencing. The diagnosis of anxiety or panic attack is made when no physical illness can be determined to be a cause of the symptoms. TREATMENT  Treatment to prevent anxiety and panic attacks may include:  Avoidance of circumstances that cause anxiety.   Reassurance and relaxation.   Regular exercise.   Relaxation therapies, such as yoga.   Psychotherapy with a psychiatrist or therapist.   Avoidance of caffeine, alcohol and illegal drugs.   Prescribed medication.  SEEK IMMEDIATE MEDICAL CARE IF:   You experience panic attack symptoms that are different than your usual symptoms.   You have any worsening or concerning symptoms.  Document Released: 11/20/2005 Document Revised: 08/02/2011 Document Reviewed: 03/24/2010 [facility] Patient Information 2012 [place], [place].

## 2011-12-08 NOTE — Progress Notes (Signed)
Subjective:    Patient ID: Paul Paul, male    DOB: 09-Jan-1956, 56 y.o.   MRN: [id number]  HPI 56 year old white male, former smoker, patient of Dr. Caren Hazy is in today for blood pressure management. He was started on metoprolol 25 mg a day for blood pressure by the cardiologist. He went in for aneurysm repair a week and a half ago and was found that the aneurysm had disappeared. However, the neurosurgeon suggested that he keep tight control of his blood pressure. At current, his blood pressures been running 140s over 90s. He denies any lightheadedness, dizziness, shortness of breath, chest pain or palpitations. He has a pacemaker.  Patient had concerns of an itchy red rash noted under both under arms for several weeks. He has been applying baby powder without relief. The rash appears to be spreading. He has a history of diabetes and his blood sugars have been 120s to 130s fasting.   Review of Systems  Constitutional: Negative.   HENT: Negative.   Respiratory: Negative.   Cardiovascular: Negative.   Genitourinary: Negative.   Musculoskeletal: Negative.   Skin:       Skin rash under both arms  Neurological: Negative.   Hematological: Negative.   Psychiatric/Behavioral: The patient is nervous/anxious.    Past Medical History  Diagnosis Date  . HYPERTENSION 12/10/2007  . Diabetes mellitus     now under control  . Pacemaker   . Bradycardia   . Dysrhythmia     BRADYCARDIA REQUIRING PACEMAKER .FOLLOWED BY GREGG TAYLOR  . Sleep apnea     CPAP.NOT WEARING NOW.NEEDS REPLACEMENT PIECE  . Arthritis   . Shoulder pain     L SHOULDER. RECENT FALL    History   Social History  . Marital Status: Married    Spouse Name: N/A    Number of Children: N/A  . Years of Education: N/A   Occupational History  . Not on file.   Social History Main Topics  . Smoking status: Former Smoker    Quit date: 07/05/2011  . Smokeless tobacco: Not on file  . Alcohol Use: No  . Drug Use: No  . Sexually  Active:    Other Topics Concern  . Not on file   Social History Narrative   Not smoking     Past Surgical History  Procedure Date  . Cardiac catheterization     normal  . Septal deviation   . Knee surgery     left; arthoscopic  . Insert / replace / remove pacemaker     Family History  Problem Relation Age of Onset  . Heart attack Mother   . Heart disease Mother   . Diabetes Mother   . Heart attack Father   . Heart disease Father     Allergies  Allergen Reactions  . Lisinopril     REACTION: rash  . Losartan Potassium-Hctz     REACTION: dizziness, fatigue  . Prednisone Other (See Comments)    Raises blood sugar  . Doxycycline Rash    Current Outpatient Prescriptions on File Prior to Visit  Medication Sig Dispense Refill  . aspirin 81 MG chewable tablet Chew 81 mg by mouth daily.          BP 136/92  Temp(Src) 97.7 F (36.5 C) (Oral)  Wt 299 lb (135.626 kg)chart    Objective:   Physical Exam  Constitutional: He appears well-developed and well-nourished.  HENT:  Right Ear: External ear normal.  Left Ear: External ear normal.  Nose: Nose normal.  Mouth/Throat: Oropharynx is clear and moist.  Neck: Normal range of motion. Neck supple.  Cardiovascular: Normal rate and normal heart sounds.   Pulmonary/Chest: Effort normal and breath sounds normal.  Musculoskeletal: Normal range of motion.  Neurological: He is alert.  Skin: Skin is warm and dry.  Psychiatric: He has a normal mood and affect.          Assessment & Plan:  Assessment: Hypertension, Candida, skin, anxiety  Plan: Toprol increased to 50 mg extended release tablet daily. Lotrisone cream applied underneath her arms twice daily. Keep area clean and dry. Patient has concerns about possible anxiety and stress, he has declined medication therapy today. He will follow up with his PCP in 2 weeks for further management.

## 2012-01-01 ENCOUNTER — Ambulatory Visit (INDEPENDENT_AMBULATORY_CARE_PROVIDER_SITE_OTHER): Payer: Managed Care, Other (non HMO) | Admitting: Internal Medicine

## 2012-01-01 DIAGNOSIS — I671 Cerebral aneurysm, nonruptured: Secondary | ICD-10-CM | POA: Insufficient documentation

## 2012-01-01 DIAGNOSIS — Z23 Encounter for immunization: Secondary | ICD-10-CM

## 2012-01-01 DIAGNOSIS — I10 Essential (primary) hypertension: Secondary | ICD-10-CM

## 2012-01-01 MED ORDER — METOPROLOL SUCCINATE ER 100 MG PO TB24: 100.0000 mg | ORAL_TABLET | Freq: Every day | ORAL | Status: DC

## 2012-01-01 NOTE — Assessment & Plan Note (Addendum)
No home BPs regulaly Son checked bp with cuff (Paramedic)---120/70 BP Readings from Last 3 Encounters:  01/01/12 154/82  12/08/11 136/92  11/30/11 135/78   I've asked him to monitor his blood pressure at home. He will increase Toprol-XL 100 mg by mouth daily., Call me With blood pressure results in 2-3 weeks.

## 2012-01-05 ENCOUNTER — Other Ambulatory Visit: Payer: Self-pay | Admitting: Orthopedic Surgery

## 2012-01-05 DIAGNOSIS — M25512 Pain in left shoulder: Secondary | ICD-10-CM

## 2012-01-05 DIAGNOSIS — R29898 Other symptoms and signs involving the musculoskeletal system: Secondary | ICD-10-CM

## 2012-01-05 NOTE — Progress Notes (Signed)
Patient ID: Paul Paul, male   DOB: 01/30/1956, 56 y.o.   MRN: [id number] Feels well. htn---tolerating meds Cerebral aneurysm---see angiogram and other imaging studies  Past Medical History  Diagnosis Date  . HYPERTENSION 12/10/2007  . Diabetes mellitus     now under control  . Pacemaker   . Bradycardia   . Dysrhythmia     BRADYCARDIA REQUIRING PACEMAKER .FOLLOWED BY [facility]  . Sleep apnea     CPAP.NOT WEARING NOW.NEEDS REPLACEMENT PIECE  . Arthritis   . Shoulder pain     L SHOULDER. RECENT FALL    History   Social History  . Marital Status: Married    Spouse Name: N/A    Number of Children: N/A  . Years of Education: N/A   Occupational History  . Not on file.   Social History Main Topics  . Smoking status: Former Smoker    Quit date: 07/05/2011  . Smokeless tobacco: Not on file  . Alcohol Use: No  . Drug Use: No  . Sexually Active:    Other Topics Concern  . Not on file   Social History Narrative   Not smoking     Past Surgical History  Procedure Date  . Cardiac catheterization     normal  . Septal deviation   . Knee surgery     left; arthoscopic  . Insert / replace / remove pacemaker     Family History  Problem Relation Age of Onset  . Heart attack Mother   . Heart disease Mother   . Diabetes Mother   . Heart attack Father   . Heart disease Father     Allergies  Allergen Reactions  . Lisinopril     REACTION: rash  . Losartan Potassium-Hctz     REACTION: dizziness, fatigue  . Prednisone Other (See Comments)    Raises blood sugar  . Doxycycline Rash    Current Outpatient Prescriptions on File Prior to Visit  Medication Sig Dispense Refill  . aspirin 81 MG chewable tablet Chew 81 mg by mouth daily.        . clotrimazole-betamethasone (LOTRISONE) cream Apply topically 2 (two) times daily.  45 g  0     patient denies chest pain, shortness of breath, orthopnea. Denies lower extremity edema, abdominal pain, change in appetite, change in  bowel movements. Patient denies rashes, musculoskeletal complaints. No other specific complaints in a complete review of systems.   BP 154/82  Pulse 84  Temp(Src) 98.1 F (36.7 C) (Oral)  Wt 302 lb (136.986 kg)  well-developed well-nourished male in no acute distress. HEENT exam atraumatic, normocephalic, neck supple without jugular venous distention. Chest clear to auscultation cardiac exam S1-S2 are regular. Abdominal exam overweight with bowel sounds, soft and nontender. Extremities no edema. Neurologic exam is alert with a normal gait.

## 2012-01-05 NOTE — Assessment & Plan Note (Signed)
Reviewed angiogram and mris No need for further eval at this time Needs bp control (probably only long term success will be through aggressive weight loss)

## 2012-01-09 ENCOUNTER — Other Ambulatory Visit: Payer: Managed Care, Other (non HMO)

## 2012-01-11 ENCOUNTER — Encounter: Payer: Self-pay | Admitting: Internal Medicine

## 2012-01-11 ENCOUNTER — Encounter: Payer: Managed Care, Other (non HMO) | Admitting: *Deleted

## 2012-01-12 ENCOUNTER — Other Ambulatory Visit: Payer: Managed Care, Other (non HMO)

## 2012-01-12 ENCOUNTER — Ambulatory Visit
Admission: RE | Admit: 2012-01-12 | Discharge: 2012-01-12 | Disposition: A | Discharge status: Home / Self Care | Payer: Managed Care, Other (non HMO) | Source: Ambulatory Visit | Attending: Orthopedic Surgery | Admitting: Orthopedic Surgery

## 2012-01-12 DIAGNOSIS — M25512 Pain in left shoulder: Secondary | ICD-10-CM

## 2012-01-12 DIAGNOSIS — R29898 Other symptoms and signs involving the musculoskeletal system: Secondary | ICD-10-CM

## 2012-01-19 ENCOUNTER — Telehealth: Payer: Self-pay | Admitting: Internal Medicine

## 2012-01-19 NOTE — Telephone Encounter (Signed)
New msg Pt wants to know if he can move surgical clearance appt up sooner if possible. Please call him back

## 2012-01-19 NOTE — Telephone Encounter (Signed)
Spoke with pt, he has a cracked collar bone and a bicep tear on the left shoulder. He is having a lot of pain and requested to be sooner or if he could get surgical clearance without being seen. Aware dr Ladona Ridgel not here and will forward for his review.

## 2012-01-22 ENCOUNTER — Encounter: Payer: Self-pay | Admitting: Internal Medicine

## 2012-01-22 NOTE — Telephone Encounter (Signed)
Pt calling back to see what's all involved in a surgical consult? Per kelly office visit and EKG, will call pt/mt

## 2012-01-22 NOTE — Telephone Encounter (Signed)
Pt calling to see what's involved in a surgical clearence

## 2012-01-22 NOTE — Telephone Encounter (Signed)
This encounter was created in error - please disregard.

## 2012-01-24 ENCOUNTER — Ambulatory Visit (INDEPENDENT_AMBULATORY_CARE_PROVIDER_SITE_OTHER): Payer: Managed Care, Other (non HMO) | Admitting: Physician Assistant

## 2012-01-24 ENCOUNTER — Encounter: Payer: Self-pay | Admitting: Physician Assistant

## 2012-01-24 VITALS — BP 130/84 | HR 60 | Ht 75.0 in | Wt 309.0 lb

## 2012-01-24 DIAGNOSIS — Z01810 Encounter for preprocedural cardiovascular examination: Secondary | ICD-10-CM

## 2012-01-24 DIAGNOSIS — Z8679 Personal history of other diseases of the circulatory system: Secondary | ICD-10-CM

## 2012-01-24 DIAGNOSIS — Z01818 Encounter for other preprocedural examination: Secondary | ICD-10-CM | POA: Insufficient documentation

## 2012-01-24 DIAGNOSIS — Z950 Presence of cardiac pacemaker: Secondary | ICD-10-CM

## 2012-01-24 DIAGNOSIS — I498 Other specified cardiac arrhythmias: Secondary | ICD-10-CM

## 2012-01-24 NOTE — Assessment & Plan Note (Signed)
Patient has a [employer] pacemaker and is pacer dependent. [employer] representatives need to be present during his surgery so they can turn his sensor off but continue to pace him. Otherwise patient is cleared from a cardiac standpoint for surgery.

## 2012-01-24 NOTE — Progress Notes (Signed)
HPI:  This is a 56 year old white male patient who presents for precardiac clearance before undergoing left rotator cuff repair by Dr. August Saucer. He has a history of syncope secondary to bradycardia and underwent permanent pacemaker last summer. He has done quite well from a cardiac standpoint. He was recently hospitalized with hypertension and had a CT angiogram showed minor cerebral aneurysms that need to be followed up in 6-8 months. His blood pressure is now under control.  Patient denies any chest pain, palpitations, dyspnea, dyspnea on exertion, dizziness, or presyncope. His pacemaker was checked when he was hospitalized in the past month.  Allergies  Allergen Reactions  . Lisinopril     REACTION: rash  . Losartan Potassium-Hctz     REACTION: dizziness, fatigue  . Prednisone Other (See Comments)    Raises blood sugar  . Doxycycline Rash    Current Outpatient Prescriptions on File Prior to Visit  Medication Sig Dispense Refill  . aspirin 81 MG chewable tablet Chew 81 mg by mouth daily.        . metoprolol succinate (TOPROL-XL) 100 MG 24 hr tablet Take 1 tablet (100 mg total) by mouth daily.  90 tablet  3    Past Medical History  Diagnosis Date  . HYPERTENSION 12/10/2007  . Diabetes mellitus     now under control  . Pacemaker   . Bradycardia   . Dysrhythmia     BRADYCARDIA REQUIRING PACEMAKER .FOLLOWED BY GREGG TAYLOR  . Sleep apnea     CPAP.NOT WEARING NOW.NEEDS REPLACEMENT PIECE  . Arthritis   . Shoulder pain     L SHOULDER. RECENT FALL    Past Surgical History  Procedure Date  . Cardiac catheterization     normal  . Septal deviation   . Knee surgery     left; arthoscopic  . Insert / replace / remove pacemaker     Family History  Problem Relation Age of Onset  . Heart attack Mother   . Heart disease Mother   . Diabetes Mother   . Heart attack Father   . Heart disease Father     History   Social History  . Marital Status: Married    Spouse Name: N/A   Number of Children: N/A  . Years of Education: N/A   Occupational History  . Not on file.   Social History Main Topics  . Smoking status: Former Smoker    Quit date: 07/05/2011  . Smokeless tobacco: Not on file  . Alcohol Use: No  . Drug Use: No  . Sexually Active:    Other Topics Concern  . Not on file   Social History Narrative   Not smoking     ROS: See HPI Eyes: Negative Ears:Negative for hearing loss, tinnitus Cardiovascular: Negative for chest pain, palpitations,irregular heartbeat, dyspnea, dyspnea on exertion, near-syncope, orthopnea, paroxysmal nocturnal dyspnea and syncope,edema, claudication, cyanosis,.  Respiratory:   Negative for cough, hemoptysis, shortness of breath, sleep disturbances due to breathing, sputum production and wheezing.   Endocrine: Negative for cold intolerance and heat intolerance.  Hematologic/Lymphatic: Negative for adenopathy and bleeding problem. Does not bruise/bleed easily.  Musculoskeletal:needs left rotator cuff surgery, has had left knee surgery.   Gastrointestinal: Negative for nausea, vomiting, reflux, abdominal pain, diarrhea, constipation.   Neurological: Negative.  Allergic/Immunologic: Negative for environmental allergies.   PHYSICAL EXAM: Overweight, in no acute distress. Neck: No JVD, HJR, Bruit, or thyroid enlargement Lungs: No tachypnea, clear without wheezing, rales, or rhonchi Cardiovascular: RRR, PMI not displaced,  heart sounds normal, no murmurs, gallops, bruit, thrill, or heave. Abdomen: BS normal. Soft without organomegaly, masses, lesions or tenderness. Extremities: without cyanosis, clubbing or edema. Good distal pulses bilateral SKin: Warm, no lesions or rashes  Musculoskeletal: No deformities Neuro: no focal signs  BP 130/84  Pulse 60  Ht 6\' 3"  (1.905 m)  Wt 309 lb (140.161 kg)  BMI 38.62 kg/m2   EKG:AV paced  CT angio 01/2012 IMPRESSION: 1.  Two small aneurysms arising in the right MCA  trifurcation region, one measuring approximately 2.8 mm and one measuring approximately 1.6 mm, both of which appear to have a common neck of approximately 1.5 mm.  The angiographic findings were reviewed with the patient and the patient's family.  Given the smallness of the aneurysms, and the stability compared to the MRA examination of 2006, it was elected to pursue conservative management with controlling of patient's high blood pressure, and dietary modification.  A follow-up arteriogram will be undertaken in approximately 6 to 8 months from today or sooner should symptoms dictate

## 2012-01-24 NOTE — Patient Instructions (Addendum)
Your physician recommends that you schedule a follow-up appointment in: August with Dr Ladona Ridgel

## 2012-01-24 NOTE — Assessment & Plan Note (Addendum)
Patient has history of permanent pacemaker inserted approximately 7 months ago. It was checked by [facility]. Jude's within the past month. He is having no problems. [employer] should be present at time of surgery.

## 2012-01-24 NOTE — Assessment & Plan Note (Signed)
Blood pressure control. 

## 2012-01-31 ENCOUNTER — Encounter (HOSPITAL_COMMUNITY): Payer: Self-pay | Admitting: Respiratory Therapy

## 2012-02-01 ENCOUNTER — Ambulatory Visit (INDEPENDENT_AMBULATORY_CARE_PROVIDER_SITE_OTHER): Payer: Managed Care, Other (non HMO) | Admitting: Internal Medicine

## 2012-02-01 ENCOUNTER — Ambulatory Visit: Payer: Managed Care, Other (non HMO) | Admitting: Internal Medicine

## 2012-02-01 ENCOUNTER — Encounter (HOSPITAL_COMMUNITY): Payer: Self-pay

## 2012-02-01 ENCOUNTER — Encounter (HOSPITAL_COMMUNITY)
Admission: RE | Admit: 2012-02-01 | Discharge: 2012-02-01 | Disposition: A | Discharge status: Home / Self Care | Payer: Managed Care, Other (non HMO) | Source: Ambulatory Visit | Attending: Orthopedic Surgery | Admitting: Orthopedic Surgery

## 2012-02-01 ENCOUNTER — Other Ambulatory Visit (HOSPITAL_COMMUNITY): Payer: Self-pay | Admitting: Orthopedic Surgery

## 2012-02-01 VITALS — BP 132/84

## 2012-02-01 DIAGNOSIS — I10 Essential (primary) hypertension: Secondary | ICD-10-CM

## 2012-02-01 HISTORY — DX: Anxiety disorder, unspecified: F41.9

## 2012-02-01 LAB — BASIC METABOLIC PANEL
CO2: 24 mEq/L (ref 19–32)
Calcium: 9.6 mg/dL (ref 8.4–10.5)
GFR calc Af Amer: >90 mL/min (ref >90)
GFR calc non Af Amer: >90 mL/min (ref >90)
Sodium: 136 mEq/L (ref 135–145)

## 2012-02-01 LAB — CBC
MCV: 86.9 fL (ref 78.0–100.0)
Platelets: 261 10*3/uL (ref 150–400)
RBC: 5.34 MIL/uL (ref 4.22–5.81)
WBC: 6.9 10*3/uL (ref 4.0–10.5)

## 2012-02-01 LAB — DIFFERENTIAL
Basophils Absolute: 0.1 10*3/uL (ref 0.0–0.1)
Lymphocytes Relative: 45 % (ref 12–46)
Lymphs Abs: 3.1 10*3/uL (ref 0.7–4.0)
Neutro Abs: 2.8 10*3/uL (ref 1.7–7.7)
Neutrophils Relative %: 40 % — ABNORMAL LOW (ref 43–77)

## 2012-02-01 LAB — SURGICAL PCR SCREEN
MRSA, PCR: NEGATIVE
Staphylococcus aureus: POSITIVE — AB

## 2012-02-01 NOTE — Progress Notes (Signed)
Check BP per Dr Cato Mulligan, BP was normal

## 2012-02-01 NOTE — Progress Notes (Signed)
LEFT MESSAGE FOR  [facility]P FOR [employer] .... [phone] ---  ALSO FAXED  PERIOP RX FOR PACERS/DEFIBBS TO DR Lewayne Bunting AT [phone] AND RECEIVED CONFIRMATION OF RECEIPT.

## 2012-02-01 NOTE — Pre-Procedure Instructions (Signed)
20 DEQUANTE TREMAINE  02/01/2012   Your procedure is scheduled on:  Tuesday February 06, 2012  Report to [facility] at 7:20 AM.  Call this number if you have problems the morning of surgery: [phone]   Remember:   Do not eat food:After Midnight.  May have clear liquids: up to 4 Hours before arrival. (up to 3:20am)  Clear liquids include soda, tea, black coffee, apple or grape juice, broth.  Take these medicines the morning of surgery with A SIP OF WATER: metoprolol   Do not wear jewelry, make-up or nail polish.  Do not wear lotions, powders, or perfumes. You may wear deodorant.  Do not shave 48 hours prior to surgery.  Do not bring valuables to the hospital.  Contacts, dentures or bridgework may not be worn into surgery.  Leave suitcase in the car. After surgery it may be brought to your room.  For patients admitted to the hospital, checkout time is 11:00 AM the day of discharge.   Patients discharged the day of surgery will not be allowed to drive home.  Name and phone number of your driver: family / friend  Special Instructions: CHG Shower Use Special Wash: 1/2 bottle night before surgery and 1/2 bottle morning of surgery.   Please read over the following fact sheets that you were given: Pain Booklet, Coughing and Deep Breathing, MRSA Information and Surgical Site Infection Prevention

## 2012-02-02 HISTORY — PX: OTHER SURGICAL HISTORY: SHX169

## 2012-02-05 MED ORDER — CHLORHEXIDINE GLUCONATE 4 % EX LIQD
60.0000 mL | Freq: Once | CUTANEOUS | Status: DC
  Filled 2012-02-05: qty 60

## 2012-02-05 MED ORDER — CEFAZOLIN SODIUM-DEXTROSE 2-3 GM-% IV SOLR
2.0000 g | INTRAVENOUS | Status: AC
  Administered 2012-02-06: 2 g via INTRAVENOUS
  Filled 2012-02-05: qty 50

## 2012-02-06 ENCOUNTER — Inpatient Hospital Stay (HOSPITAL_COMMUNITY)
Admission: AD | Admit: 2012-02-06 | Discharge: 2012-02-08 | DRG: 497 | Disposition: A | Discharge status: Home / Self Care | Payer: Managed Care, Other (non HMO) | Source: Ambulatory Visit | Attending: Orthopedic Surgery | Admitting: Orthopedic Surgery

## 2012-02-06 ENCOUNTER — Ambulatory Visit (HOSPITAL_COMMUNITY): Payer: Managed Care, Other (non HMO) | Admitting: Anesthesiology

## 2012-02-06 ENCOUNTER — Encounter (HOSPITAL_COMMUNITY)
Admission: AD | Disposition: A | Discharge status: Home / Self Care | Payer: Self-pay | Source: Ambulatory Visit | Attending: Orthopedic Surgery

## 2012-02-06 ENCOUNTER — Encounter (HOSPITAL_COMMUNITY): Payer: Self-pay | Admitting: Anesthesiology

## 2012-02-06 ENCOUNTER — Encounter (HOSPITAL_COMMUNITY): Payer: Self-pay | Admitting: *Deleted

## 2012-02-06 DIAGNOSIS — M719 Bursopathy, unspecified: Principal | ICD-10-CM | POA: Diagnosis present

## 2012-02-06 DIAGNOSIS — F411 Generalized anxiety disorder: Secondary | ICD-10-CM | POA: Diagnosis present

## 2012-02-06 DIAGNOSIS — Z950 Presence of cardiac pacemaker: Secondary | ICD-10-CM

## 2012-02-06 DIAGNOSIS — M659 Unspecified synovitis and tenosynovitis, unspecified site: Secondary | ICD-10-CM | POA: Diagnosis present

## 2012-02-06 DIAGNOSIS — Z01812 Encounter for preprocedural laboratory examination: Secondary | ICD-10-CM

## 2012-02-06 DIAGNOSIS — G4730 Sleep apnea, unspecified: Secondary | ICD-10-CM | POA: Diagnosis present

## 2012-02-06 DIAGNOSIS — E119 Type 2 diabetes mellitus without complications: Secondary | ICD-10-CM | POA: Diagnosis present

## 2012-02-06 DIAGNOSIS — I10 Essential (primary) hypertension: Secondary | ICD-10-CM

## 2012-02-06 DIAGNOSIS — M67919 Unspecified disorder of synovium and tendon, unspecified shoulder: Principal | ICD-10-CM | POA: Diagnosis present

## 2012-02-06 LAB — GLUCOSE, CAPILLARY: Glucose-Capillary: 142 mg/dL — ABNORMAL HIGH (ref 70–99)

## 2012-02-06 LAB — PROTIME-INR
INR: 1.08 (ref 0.00–1.49)
Prothrombin Time: 14.2 seconds (ref 11.6–15.2)

## 2012-02-06 SURGERY — SHOULDER ARTHROSCOPY WITH OPEN ROTATOR CUFF REPAIR AND DISTAL CLAVICLE ACROMINECTOMY
Anesthesia: General | Site: Shoulder | Laterality: Left | Wound class: Clean

## 2012-02-06 MED ORDER — METHOCARBAMOL 100 MG/ML IJ SOLN
500.0000 mg | Freq: Four times a day (QID) | INTRAVENOUS | Status: DC | PRN
  Administered 2012-02-06: 500 mg via INTRAVENOUS

## 2012-02-06 MED ORDER — CEFAZOLIN SODIUM 1-5 GM-% IV SOLN
1.0000 g | Freq: Four times a day (QID) | INTRAVENOUS | Status: AC
  Administered 2012-02-06 – 2012-02-07 (×3): 1 g via INTRAVENOUS
  Filled 2012-02-06 (×3): qty 50

## 2012-02-06 MED ORDER — POTASSIUM CHLORIDE IN NACL 20-0.9 MEQ/L-% IV SOLN
INTRAVENOUS | Status: AC
  Filled 2012-02-06: qty 1000

## 2012-02-06 MED ORDER — SODIUM CHLORIDE 0.9 % IJ SOLN
INTRAMUSCULAR | Status: DC | PRN
  Administered 2012-02-06: 50 mL

## 2012-02-06 MED ORDER — 0.9 % SODIUM CHLORIDE (POUR BTL) OPTIME
TOPICAL | Status: DC | PRN
  Administered 2012-02-06: 1000 mL

## 2012-02-06 MED ORDER — FENTANYL CITRATE 0.05 MG/ML IJ SOLN
INTRAMUSCULAR | Status: DC | PRN
  Administered 2012-02-06 (×3): 50 ug via INTRAVENOUS
  Administered 2012-02-06: 100 ug via INTRAVENOUS
  Administered 2012-02-06: 50 ug via INTRAVENOUS

## 2012-02-06 MED ORDER — MORPHINE SULFATE (PF) 1 MG/ML IV SOLN
INTRAVENOUS | Status: DC
  Administered 2012-02-06: 19.5 mg via INTRAVENOUS
  Administered 2012-02-06 (×2): via INTRAVENOUS

## 2012-02-06 MED ORDER — WARFARIN VIDEO
Freq: Once | Status: AC
  Administered 2012-02-06: 18:00:00

## 2012-02-06 MED ORDER — GLYCOPYRROLATE 0.2 MG/ML IJ SOLN
INTRAMUSCULAR | Status: DC | PRN
  Administered 2012-02-06: .8 mg via INTRAVENOUS

## 2012-02-06 MED ORDER — SODIUM CHLORIDE 0.9 % IJ SOLN: 9.0000 mL | INTRAMUSCULAR | Status: DC | PRN

## 2012-02-06 MED ORDER — NEOSTIGMINE METHYLSULFATE 1 MG/ML IJ SOLN
INTRAMUSCULAR | Status: DC | PRN
  Administered 2012-02-06: 5 mg via INTRAVENOUS

## 2012-02-06 MED ORDER — CLONIDINE HCL (ANALGESIA) 100 MCG/ML EP SOLN
150.0000 ug | EPIDURAL | Status: AC
  Filled 2012-02-06: qty 1.5

## 2012-02-06 MED ORDER — COUMADIN BOOK
Freq: Once | Status: AC
Start: 2012-02-06 — End: 2012-02-06
  Administered 2012-02-06: 18:00:00
  Filled 2012-02-06: qty 1

## 2012-02-06 MED ORDER — WARFARIN - PHARMACIST DOSING INPATIENT
Freq: Every day | Status: DC
  Administered 2012-02-06: 18:00:00
  Filled 2012-02-06 (×4): qty 1

## 2012-02-06 MED ORDER — ROCURONIUM BROMIDE 100 MG/10ML IV SOLN
INTRAVENOUS | Status: DC | PRN
  Administered 2012-02-06 (×3): 10 mg via INTRAVENOUS
  Administered 2012-02-06: 50 mg via INTRAVENOUS

## 2012-02-06 MED ORDER — SUCCINYLCHOLINE CHLORIDE 20 MG/ML IJ SOLN
INTRAMUSCULAR | Status: DC | PRN
  Administered 2012-02-06: 140 mg via INTRAVENOUS

## 2012-02-06 MED ORDER — DIPHENHYDRAMINE HCL 50 MG/ML IJ SOLN: 12.5000 mg | Freq: Four times a day (QID) | INTRAMUSCULAR | Status: DC | PRN

## 2012-02-06 MED ORDER — ACETAMINOPHEN 650 MG RE SUPP: 650.0000 mg | Freq: Four times a day (QID) | RECTAL | Status: DC | PRN

## 2012-02-06 MED ORDER — ASPIRIN 81 MG PO CHEW
81.0000 mg | CHEWABLE_TABLET | Freq: Every day | ORAL | Status: DC
  Administered 2012-02-07 – 2012-02-08 (×2): 81 mg via ORAL
  Filled 2012-02-06 (×2): qty 1

## 2012-02-06 MED ORDER — METHOCARBAMOL 500 MG PO TABS
500.0000 mg | ORAL_TABLET | Freq: Four times a day (QID) | ORAL | Status: DC | PRN
  Administered 2012-02-07 – 2012-02-08 (×4): 500 mg via ORAL
  Filled 2012-02-06 (×4): qty 1

## 2012-02-06 MED ORDER — METOPROLOL SUCCINATE ER 100 MG PO TB24
100.0000 mg | ORAL_TABLET | Freq: Every day | ORAL | Status: DC
  Administered 2012-02-07 – 2012-02-08 (×2): 100 mg via ORAL
  Filled 2012-02-06 (×2): qty 1

## 2012-02-06 MED ORDER — DIPHENHYDRAMINE HCL 12.5 MG/5ML PO ELIX: 12.5000 mg | ORAL_SOLUTION | Freq: Four times a day (QID) | ORAL | Status: DC | PRN

## 2012-02-06 MED ORDER — PHENOL 1.4 % MT LIQD: 1.0000 | OROMUCOSAL | Status: DC | PRN

## 2012-02-06 MED ORDER — EPINEPHRINE HCL 1 MG/ML IJ SOLN
INTRAMUSCULAR | Status: DC | PRN
  Administered 2012-02-06: .1 mL via SUBCUTANEOUS

## 2012-02-06 MED ORDER — METOCLOPRAMIDE HCL 5 MG/ML IJ SOLN: 5.0000 mg | Freq: Three times a day (TID) | INTRAMUSCULAR | Status: DC | PRN

## 2012-02-06 MED ORDER — ONDANSETRON HCL 4 MG PO TABS: 4.0000 mg | ORAL_TABLET | Freq: Four times a day (QID) | ORAL | Status: DC | PRN

## 2012-02-06 MED ORDER — MIDAZOLAM HCL 5 MG/5ML IJ SOLN
INTRAMUSCULAR | Status: DC | PRN
  Administered 2012-02-06: 2 mg via INTRAVENOUS
  Administered 2012-02-06: 1 mg via INTRAVENOUS

## 2012-02-06 MED ORDER — MORPHINE SULFATE 4 MG/ML IJ SOLN
INTRAMUSCULAR | Status: DC | PRN
  Administered 2012-02-06: 4 mg via SUBCUTANEOUS

## 2012-02-06 MED ORDER — WARFARIN SODIUM 7.5 MG PO TABS
7.5000 mg | ORAL_TABLET | Freq: Once | ORAL | Status: AC
  Administered 2012-02-06: 7.5 mg via ORAL
  Filled 2012-02-06: qty 1

## 2012-02-06 MED ORDER — METOCLOPRAMIDE HCL 10 MG PO TABS: 5.0000 mg | ORAL_TABLET | Freq: Three times a day (TID) | ORAL | Status: DC | PRN

## 2012-02-06 MED ORDER — ONDANSETRON HCL 4 MG/2ML IJ SOLN: 4.0000 mg | Freq: Four times a day (QID) | INTRAMUSCULAR | Status: DC | PRN

## 2012-02-06 MED ORDER — CLONIDINE HCL (ANALGESIA) 100 MCG/ML EP SOLN
EPIDURAL | Status: DC | PRN
  Administered 2012-02-06: 1 mL

## 2012-02-06 MED ORDER — PHENYLEPHRINE HCL 10 MG/ML IJ SOLN
10.0000 mg | INTRAMUSCULAR | Status: DC | PRN
  Administered 2012-02-06: 20 ug/min via INTRAVENOUS

## 2012-02-06 MED ORDER — MENTHOL 3 MG MT LOZG: 1.0000 | LOZENGE | OROMUCOSAL | Status: DC | PRN

## 2012-02-06 MED ORDER — ACETAMINOPHEN 325 MG PO TABS: 650.0000 mg | ORAL_TABLET | Freq: Four times a day (QID) | ORAL | Status: DC | PRN

## 2012-02-06 MED ORDER — NALOXONE HCL 0.4 MG/ML IJ SOLN: 0.4000 mg | INTRAMUSCULAR | Status: DC | PRN

## 2012-02-06 MED ORDER — BUPIVACAINE HCL 0.5 % IJ SOLN
INTRAMUSCULAR | Status: DC | PRN
  Administered 2012-02-06: 10 mL

## 2012-02-06 MED ORDER — LACTATED RINGERS IV SOLN
INTRAVENOUS | Status: DC | PRN
  Administered 2012-02-06: 08:00:00 via INTRAVENOUS

## 2012-02-06 MED ORDER — PROPOFOL 10 MG/ML IV EMUL
INTRAVENOUS | Status: DC | PRN
  Administered 2012-02-06: 200 mg via INTRAVENOUS
  Administered 2012-02-06: 30 mg via INTRAVENOUS

## 2012-02-06 MED ORDER — HYDROMORPHONE HCL PF 1 MG/ML IJ SOLN: 0.2500 mg | INTRAMUSCULAR | Status: DC | PRN

## 2012-02-06 MED ORDER — METHOCARBAMOL 100 MG/ML IJ SOLN
500.0000 mg | INTRAVENOUS | Status: AC
  Administered 2012-02-06: 500 mg via INTRAVENOUS
  Filled 2012-02-06: qty 5

## 2012-02-06 MED ORDER — LACTATED RINGERS IV SOLN
INTRAVENOUS | Status: DC
  Administered 2012-02-06: 08:00:00 via INTRAVENOUS

## 2012-02-06 MED ORDER — ONDANSETRON HCL 4 MG/2ML IJ SOLN
INTRAMUSCULAR | Status: DC | PRN
  Administered 2012-02-06: 4 mg via INTRAVENOUS

## 2012-02-06 SURGICAL SUPPLY — 74 items
ANCHOR CORKSCREW BIO 5.5 FT (Anchor) ×2 IMPLANT
BENZOIN TINCTURE PRP APPL 2/3 (GAUZE/BANDAGES/DRESSINGS) ×2 IMPLANT
BIT DRILL TAK (DRILL) IMPLANT
BLADE CUDA 5.5 (BLADE) ×2 IMPLANT
BLADE CUTTER GATOR 3.5 (BLADE) IMPLANT
BLADE GREAT WHITE 4.2 (BLADE) ×2 IMPLANT
BLADE SURG 11 STRL SS (BLADE) ×2 IMPLANT
BUR GATOR 2.9 (BURR) IMPLANT
BUR OVAL 6.0 (BURR) ×2 IMPLANT
CANNULA SHOULDER 7CM (CANNULA) IMPLANT
CARTRIDGE CURVETEK MED (MISCELLANEOUS) IMPLANT
CARTRIDGE CURVETEK XLRG (MISCELLANEOUS) IMPLANT
CLOTH BEACON ORANGE TIMEOUT ST (SAFETY) ×2 IMPLANT
COVER SURGICAL LIGHT HANDLE (MISCELLANEOUS) ×4 IMPLANT
DRAPE INCISE IOBAN 66X45 STRL (DRAPES) ×4 IMPLANT
DRAPE STERI 35X30 U-POUCH (DRAPES) ×2 IMPLANT
DRAPE U-SHAPE 47X51 STRL (DRAPES) ×4 IMPLANT
DRILL TAK (DRILL)
DRSG PAD ABDOMINAL 8X10 ST (GAUZE/BANDAGES/DRESSINGS) ×2 IMPLANT
DURAPREP 26ML APPLICATOR (WOUND CARE) ×2 IMPLANT
ELECT MENISCUS 165MM 90D (ELECTRODE) IMPLANT
ELECT REM PT RETURN 9FT ADLT (ELECTROSURGICAL) ×2
ELECTRODE REM PT RTRN 9FT ADLT (ELECTROSURGICAL) ×1 IMPLANT
FILTER STRAW FLUID ASPIR (MISCELLANEOUS) ×2 IMPLANT
GAUZE XEROFORM 1X8 LF (GAUZE/BANDAGES/DRESSINGS) IMPLANT
GLOVE BIO SURGEON ST LM GN SZ9 (GLOVE) IMPLANT
GLOVE BIOGEL PI IND STRL 8 (GLOVE) ×2 IMPLANT
GLOVE BIOGEL PI INDICATOR 8 (GLOVE) ×2
GLOVE SURG ORTHO 8.0 STRL STRW (GLOVE) ×4 IMPLANT
GLOVE SURG SS PI 7.5 STRL IVOR (GLOVE) ×8 IMPLANT
GOWN PREVENTION PLUS LG XLONG (DISPOSABLE) IMPLANT
GOWN PREVENTION PLUS XLARGE (GOWN DISPOSABLE) ×2 IMPLANT
GOWN STRL NON-REIN LRG LVL3 (GOWN DISPOSABLE) ×4 IMPLANT
KIT BASIN OR (CUSTOM PROCEDURE TRAY) ×2 IMPLANT
KIT ROOM TURNOVER OR (KITS) ×2 IMPLANT
MANIFOLD NEPTUNE II (INSTRUMENTS) ×2 IMPLANT
NDL SUT 6 .5 CRC .975X.05 MAYO (NEEDLE) ×1 IMPLANT
NEEDLE HYPO 25X1 1.5 SAFETY (NEEDLE) ×2 IMPLANT
NEEDLE MAYO TAPER (NEEDLE) ×1
NEEDLE SPNL 18GX3.5 QUINCKE PK (NEEDLE) ×2 IMPLANT
NS IRRIG 1000ML POUR BTL (IV SOLUTION) ×2 IMPLANT
PACK SHOULDER (CUSTOM PROCEDURE TRAY) ×2 IMPLANT
PAD ARMBOARD 7.5X6 YLW CONV (MISCELLANEOUS) ×4 IMPLANT
PUSHLOCK PEEK 4.5X24 (Orthopedic Implant) ×2 IMPLANT
SET ARTHROSCOPY TUBING (MISCELLANEOUS) ×1
SET ARTHROSCOPY TUBING LN (MISCELLANEOUS) ×1 IMPLANT
SLING ARM FOAM STRAP XLG (SOFTGOODS) ×2 IMPLANT
SLING ARM IMMOBILIZER MED (SOFTGOODS) IMPLANT
SPEAR FASTAKII (SLEEVE) IMPLANT
SPONGE GAUZE 4X4 12PLY (GAUZE/BANDAGES/DRESSINGS) IMPLANT
SPONGE LAP 18X18 X RAY DECT (DISPOSABLE) ×2 IMPLANT
SPONGE LAP 4X18 X RAY DECT (DISPOSABLE) IMPLANT
STRIP CLOSURE SKIN 1/2X4 (GAUZE/BANDAGES/DRESSINGS) IMPLANT
SUCTION FRAZIER TIP 10 FR DISP (SUCTIONS) ×2 IMPLANT
SUT ETHILON 3 0 PS 1 (SUTURE) ×2 IMPLANT
SUT FIBERWIRE 2-0 18 17.9 3/8 (SUTURE) ×6
SUT PROLENE 3 0 PS 2 (SUTURE) IMPLANT
SUT VIC AB 0 CT1 27 (SUTURE) ×2
SUT VIC AB 0 CT1 27XBRD ANBCTR (SUTURE) ×2 IMPLANT
SUT VIC AB 1 CT1 27 (SUTURE) ×1
SUT VIC AB 1 CT1 27XBRD ANBCTR (SUTURE) ×1 IMPLANT
SUT VIC AB 2-0 CT1 27 (SUTURE) ×2
SUT VIC AB 2-0 CT1 TAPERPNT 27 (SUTURE) ×2 IMPLANT
SUT VICRYL 0 UR6 27IN ABS (SUTURE) IMPLANT
SUTURE FIBERWR 2-0 18 17.9 3/8 (SUTURE) ×3 IMPLANT
SYR 20CC LL (SYRINGE) ×2 IMPLANT
SYR 30ML SLIP (SYRINGE) ×2 IMPLANT
SYR 3ML LL SCALE MARK (SYRINGE) IMPLANT
SYR TB 1ML LUER SLIP (SYRINGE) ×2 IMPLANT
TAPE CLOTH SURG 4X10 WHT LF (GAUZE/BANDAGES/DRESSINGS) ×2 IMPLANT
TOWEL OR 17X24 6PK STRL BLUE (TOWEL DISPOSABLE) ×2 IMPLANT
TOWEL OR 17X26 10 PK STRL BLUE (TOWEL DISPOSABLE) ×2 IMPLANT
WAND 90 DEG TURBOVAC W/CORD (SURGICAL WAND) ×2 IMPLANT
WATER STERILE IRR 1000ML POUR (IV SOLUTION) ×2 IMPLANT

## 2012-02-06 NOTE — H&P (Signed)
NAME:  Paul Paul, Paul Paul NO.:  [id number]  MEDICAL RECORD NO.:  [id number]  LOCATION:  [facility]                         FACILITY:  [facility]  PHYSICIAN:  Burnard Bunting, M.D.    DATE OF BIRTH:  1956-09-02  DATE OF ADMISSION:  02/06/2012 DATE OF DISCHARGE:                             HISTORY & PHYSICAL   CHIEF COMPLAINT:  Left shoulder pain.  HISTORY OF PRESENT ILLNESS:  Paul Paul is a 56 year old patient with left shoulder pain.  The patient had an injury several months ago when he passed out.  Since that time, he has had a pacemaker placed.  He still is having symptoms with the left shoulder.  He has had a CT arthrogram of the shoulder, which does show full-thickness rotator cuff tear.  He has been trying to work out but still has a lot of pain in his shoulder.  He denies any neck pain or numbness and tingling.  He is taking 81 mg of baby aspirin.  He had a knee surgery in 2007, pacemaker placed in 2012, as well as  Mohs surgery.  FAMILY HISTORY:  Positive for diabetes, cancer, blood pressure, heart disease, lung disease.  No history of DVT or pulmonary embolism.  He is married, but unemployed.  Does not smoke. Does have 20 pack-year smoking history.  Does not drink.  All other systems reviewed negative as they relate to the left shoulder.  PHYSICAL EXAMINATION:  GENERAL:  Well-developed, well-nourished, in no acute distress.  Alert and oriented. Normal body mass index.  Normal gait alignment. CHEST:  Clear to auscultation. HEART:  Regular rate and rhythm. ABDOMEN:  Benign. EXTREMITIES:  Left shoulder demonstrates pretty good passive and active range of motion.  He has positive impingement sign.  No AC joint tenderness to direct palpation.  With cross-arm adduction, he does have some coarse grinding and crepitus with active and passive range of motion of the shoulder.  He has no masses, lymphadenopathy or skin changes in the left shoulder region.  CT arthrogram  does show a full-thickness rotator cuff tear.  Impression: Full-thickness rotator cuff tear.  PLAN:  Arthroscopy with rotator cuff repair.  Risks and benefits were discussed with the patient, including but not limited to infection, nerve or vessel damage, incomplete pain relief, as well as prolonged rehab, need for CPM machine.  The patient understands the risks and benefits and will proceed.  Rep for the pacemaker will be present for surgery.  All questions were answered.     Burnard Bunting, M.D.     GSD/MEDQ  D:  02/05/2012  T:  02/06/2012  Job:  [id number]

## 2012-02-06 NOTE — Progress Notes (Signed)
ANTICOAGULATION CONSULT NOTE - Initial Consult  Pharmacy Consult for Coumadin Indication: VTE prophylaxis  Allergies  Allergen Reactions  . Lisinopril     REACTION: rash  . Losartan Potassium-Hctz     REACTION: dizziness, fatigue  . Prednisone Other (See Comments)    Raises blood sugar  . Doxycycline Rash    Patient Measurements:   Heparin Dosing Weight: 138.8 kg  Vital Signs: Temp: 98.6 F (37 C) (03/05 1526) Temp src: Oral (03/05 0703) BP: 121/70 mmHg (03/05 1526) Pulse Rate: 60  (03/05 1526)  Labs: No results found for this basename: HGB:2,HCT:3,PLT:3,APTT:3,LABPROT:3,INR:3,HEPARINUNFRC:3,CREATININE:3,CKTOTAL:3,CKMB:3,TROPONINI:3 in the last 72 hours The CrCl is unknown because both a height and weight (above a minimum accepted value) are required for this calculation.  Medical History: Past Medical History  Diagnosis Date  . HYPERTENSION 12/10/2007  . Diabetes mellitus     now under control  . Pacemaker   . Bradycardia   . Dysrhythmia     BRADYCARDIA REQUIRING PACEMAKER .FOLLOWED BY GREGG TAYLOR  . Sleep apnea     CPAP.NOT WEARING NOW.NEEDS REPLACEMENT PIECE  . Arthritis   . Shoulder pain     L SHOULDER. RECENT FALL  . Shortness of breath     WITH EXERTION  . Anxiety     Medications:  Prescriptions prior to admission  Medication Sig Dispense Refill  . aspirin 81 MG chewable tablet Chew 81 mg by mouth daily.        . metoprolol succinate (TOPROL-XL) 100 MG 24 hr tablet Take 1 tablet (100 mg total) by mouth daily.  90 tablet  3    Assessment: SHOULDER ARTHROSCOPY WITH OPEN ROTATOR CUFF REPAIR AND DISTAL CLAVICLE ACROMINECTOMY (Left)  Anticoag: s/p fall during a syncopal episode several months ago injuring left shoulder. Start Coumadin for VTE prophylaxis. No baseline PT/INR has been done.  Cards: PPM, HTN  Endo: DM  Neuro: Anxiety  Goal of Therapy:  INR 2-3   Plan:  Must check baseline PT/INR first. Coumadin 7.5mg  po x 1 tonight Initiate  education with book/video. Daily PT/INR  Misty Stanley Stillinger 02/06/2012,3:38 PM

## 2012-02-06 NOTE — Brief Op Note (Signed)
02/06/2012  12:09 PM  PATIENT:  Paul Paul  56 y.o. male  PRE-OPERATIVE DIAGNOSIS:  left shoulder rotator cuff tear  POST-OPERATIVE DIAGNOSIS:  left shoulder rotator cuff tear  PROCEDURE:  Procedure(s): SHOULDER ARTHROSCOPY WITH OPEN ROTATOR CUFF REPAIR AND subacromial decompression and limited debridement of synovitis  SURGEON:  Surgeon(s): Cammy Copa, MD  ASSISTANT:   ANESTHESIA:   epidural  EBL: 35 ml       BLOOD ADMINISTERED: none  DRAINS: none   LOCAL MEDICATIONS USED:  none  SPECIMEN:  No Specimen  COUNTS:  YES  TOURNIQUET:  * No tourniquets in log *  DICTATION: .Other Dictation: Dictation Number [id number]  PLAN OF CARE: Admit for overnight observation  PATIENT DISPOSITION:  PACU - hemodynamically stable

## 2012-02-06 NOTE — Anesthesia Postprocedure Evaluation (Signed)
Anesthesia Post Note  Patient: Paul Paul  Procedure(s) Performed: Procedure(s) (LRB): SHOULDER ARTHROSCOPY WITH OPEN ROTATOR CUFF REPAIR AND DISTAL CLAVICLE ACROMINECTOMY (Left)  Anesthesia type: General  Patient location: PACU  Post pain: Pain level controlled and Adequate analgesia  Post assessment: Post-op Vital signs reviewed, Patient's Cardiovascular Status Stable, Respiratory Function Stable, Patent Airway and Pain level controlled  Last Vitals:  Filed Vitals:   02/06/12 1315  BP:   Pulse: 61  Temp:   Resp: 12    Post vital signs: Reviewed and stable  Level of consciousness: awake, alert  and oriented  Complications: No apparent anesthesia complications

## 2012-02-06 NOTE — Preoperative (Addendum)
Beta Blockers   Reason not to administer Beta Blockers:Not Applicable 

## 2012-02-06 NOTE — Progress Notes (Addendum)
[facility] returned Device form for patient and it has been placed on chart. [employer] rep [place] returned call. Note on chart related to pacemaker.     Suella Broad local [employer] rep aware of surgery and [facility] form. Will be available if needed.

## 2012-02-06 NOTE — H&P (Signed)
Paul Paul is an 56 y.o. male.   Chief Complaint: Left shoulder pain HPI: Chloride use is a 56 year old male patient who fell several months ago during a syncopal episode. He injured his left shoulder at that time. He describes pain with overhead motion and weakness in the left shoulder. The patient required a pacemaker for his heart. He has tried nonoperative therapy for the left shoulder but continues to report pain and weakness. CT scan with arthrogram dye-a full-thickness rotator cuff tear.  Past Medical History  Diagnosis Date  . HYPERTENSION 12/10/2007  . Diabetes mellitus     now under control  . Pacemaker   . Bradycardia   . Dysrhythmia     BRADYCARDIA REQUIRING PACEMAKER .FOLLOWED BY [facility]GG TAYLOR  . Sleep apnea     CPAP.NOT WEARING NOW.NEEDS REPLACEMENT PIECE  . Arthritis   . Shoulder pain     L SHOULDER. RECENT FALL  . Shortness of breath     WITH EXERTION  . Anxiety     Past Surgical History  Procedure Date  . Cardiac catheterization     normal  . Septal deviation   . Knee surgery     left; arthoscopic  . Insert / replace / remove pacemaker     Family History  Problem Relation Age of Onset  . Heart attack Mother   . Heart disease Mother   . Diabetes Mother   . Heart attack Father   . Heart disease Father    Social History:  reports that he quit smoking about 7 months ago. His smoking use included Cigarettes. He does not have any smokeless tobacco history on file. He reports that he does not drink alcohol or use illicit drugs.  Allergies:  Allergies  Allergen Reactions  . Lisinopril     REACTION: rash  . Losartan Potassium-Hctz     REACTION: dizziness, fatigue  . Prednisone Other (See Comments)    Raises blood sugar  . Doxycycline Rash    Medications Prior to Admission  Medication Dose Route Frequency Provider Last Rate Last Dose  . ceFAZolin (ANCEF) IVPB 2 g/50 mL premix  2 g Intravenous 60 min Pre-Op Cammy Copa, MD      . chlorhexidine  (HIBICLENS) 4 % liquid 4 application  60 mL Topical Once Cammy Copa, MD      . chlorhexidine (HIBICLENS) 4 % liquid 4 application  60 mL Topical Once Cammy Copa, MD      . lactated ringers infusion   Intravenous Continuous Raiford Simmonds, MD 50 mL/hr at 02/06/12 3086     Medications Prior to Admission  Medication Sig Dispense Refill  . aspirin 81 MG chewable tablet Chew 81 mg by mouth daily.        . metoprolol succinate (TOPROL-XL) 100 MG 24 hr tablet Take 1 tablet (100 mg total) by mouth daily.  90 tablet  3    Results for orders placed during the hospital encounter of 02/06/12 (from the past 48 hour(s))  GLUCOSE, CAPILLARY     Status: Abnormal   Collection Time   02/06/12  7:05 AM      Component Value Range Comment   Glucose-Capillary 142 (*) 70 - 99 (mg/dL)    No results found.  Review of Systems  Constitutional: Negative.   HENT: Negative.   Eyes: Negative.   Respiratory: Negative.   Cardiovascular: Negative.   Gastrointestinal: Negative.   Musculoskeletal: Positive for joint pain.  Skin: Negative.   Neurological:  Negative.   Endo/Heme/Allergies: Negative.   Psychiatric/Behavioral: Negative.     Blood pressure 132/85, pulse 62, temperature 98.3 F (36.8 C), temperature source Oral, resp. rate 20, SpO2 94.00%. Physical Exam  Constitutional: He appears well-developed.  HENT:  Head: Normocephalic.  Eyes: Pupils are equal, round, and reactive to light.  Neck: Normal range of motion.  Cardiovascular: Normal rate.   Respiratory: Effort normal.  GI: Soft.   on examination of the shoulder the patient has symmetric range of motion with flexion external rotation and abduction compared to the right he has positive impingement signs coarse granular crepitus with active passive range of motion of the left shoulder compared to the right no a.c. joint tenderness palpable radial pulse.   Assessment/Plan Impression is left shoulder rotator cuff tear and bursitis plan  is for arthroscopy debridement rotator cuff repair and mini open fashion. Risk and benefits are discussed with the patient including but not limited to infection nerve vessel damage shoulder stiffness incomplete pain relief. Patient understands the risks and benefits of surgery and wishes to proceed we will obtain pacemaker consultation before and during surgery plan for 23 hour observation. All questions answered. Indicating that on Brae Gartman  [facility] SCOTT 02/06/2012, 8:56 AM

## 2012-02-06 NOTE — Anesthesia Preprocedure Evaluation (Addendum)
Anesthesia Evaluation  Patient identified by MRN, date of birth, ID band Patient awake    Reviewed: Allergy & Precautions, H&P , NPO status , Patient's Chart, lab work & pertinent test results  Airway Mallampati: II  Neck ROM: full    Dental   Pulmonary shortness of breath, sleep apnea ,          Cardiovascular hypertension, + dysrhythmias     Neuro/Psych PSYCHIATRIC DISORDERS Anxiety    GI/Hepatic   Endo/Other  Diabetes mellitus-  Renal/GU      Musculoskeletal   Abdominal   Peds  Hematology   Anesthesia Other Findings   Reproductive/Obstetrics                          Anesthesia Physical Anesthesia Plan  ASA: III  Anesthesia Plan: General   Post-op Pain Management:    Induction: Intravenous  Airway Management Planned: Oral ETT  Additional Equipment:   Intra-op Plan:   Post-operative Plan: Extubation in OR  Informed Consent: I have reviewed the patients History and Physical, chart, labs and discussed the procedure including the risks, benefits and alternatives for the proposed anesthesia with the patient or authorized representative who has indicated his/her understanding and acceptance.     Plan Discussed with: CRNA and Surgeon  Anesthesia Plan Comments:         Anesthesia Quick Evaluation

## 2012-02-06 NOTE — Transfer of Care (Signed)
Immediate Anesthesia Transfer of Care Note  Patient: Paul Paul  Procedure(s) Performed: Procedure(s) (LRB): SHOULDER ARTHROSCOPY WITH OPEN ROTATOR CUFF REPAIR AND DISTAL CLAVICLE ACROMINECTOMY (Left)  Patient Location: PACU  Anesthesia Type: General  Level of Consciousness: awake and sedated  Airway & Oxygen Therapy: Patient Spontanous Breathing and Patient connected to face mask oxygen  Post-op Assessment: Report given to PACU RN and Post -op Vital signs reviewed and stable  Post vital signs: Reviewed and stable  Complications: No apparent anesthesia complications

## 2012-02-07 LAB — GLUCOSE, CAPILLARY
Glucose-Capillary: 118 mg/dL — ABNORMAL HIGH (ref 70–99)
Glucose-Capillary: 129 mg/dL — ABNORMAL HIGH (ref 70–99)

## 2012-02-07 MED ORDER — MORPHINE SULFATE 2 MG/ML IJ SOLN
INTRAMUSCULAR | Status: AC
  Administered 2012-02-07: 1 mg via INTRAVENOUS
  Filled 2012-02-07: qty 1

## 2012-02-07 MED ORDER — OXYCODONE-ACETAMINOPHEN 5-325 MG PO TABS
2.0000 | ORAL_TABLET | ORAL | Status: DC | PRN
  Administered 2012-02-07 – 2012-02-08 (×7): 2 via ORAL
  Filled 2012-02-07 (×7): qty 2

## 2012-02-07 MED ORDER — OXYCODONE-ACETAMINOPHEN 5-325 MG PO TABS: 2.0000 | ORAL_TABLET | ORAL | Status: AC | PRN

## 2012-02-07 MED ORDER — METHOCARBAMOL 500 MG PO TABS: 500.0000 mg | ORAL_TABLET | Freq: Three times a day (TID) | ORAL | Status: AC | PRN

## 2012-02-07 MED ORDER — MORPHINE SULFATE 2 MG/ML IJ SOLN
2.0000 mg | INTRAMUSCULAR | Status: DC | PRN
  Administered 2012-02-07: 1 mg via INTRAVENOUS

## 2012-02-07 MED ORDER — WARFARIN SODIUM 7.5 MG PO TABS
7.5000 mg | ORAL_TABLET | Freq: Once | ORAL | Status: DC
  Filled 2012-02-07: qty 1

## 2012-02-07 MED FILL — Morphine Sulfate Inj 4 MG/ML: INTRAMUSCULAR | Qty: 1 | Status: AC

## 2012-02-07 NOTE — Progress Notes (Signed)
Pt stable - amb in room vss Incision ok Dc today after OT

## 2012-02-07 NOTE — Op Note (Signed)
NAME:  Paul Paul NO.:  [id number]  MEDICAL RECORD NO.:  [id number]  LOCATION:  5016                         FACILITY:  [facility]  PHYSICIAN:  Burnard Bunting, M.D.    DATE OF BIRTH:  1956-06-23  DATE OF PROCEDURE:  02/06/2012 DATE OF DISCHARGE:                              OPERATIVE REPORT   PREOPERATIVE DIAGNOSIS:  Left shoulder rotator cuff tear and synovitis.  POSTOPERATIVE DIAGNOSIS:  Left shoulder rotator cuff tear and synovitis.  PROCEDURE:  Left shoulder diagnostic arthroscopy with limited debridement of synovitis and labral fraying.  Arthroscopic subacromial decompression with mini-open rotator cuff tear repair.  SURGEON:  Burnard Bunting, M.D.  ASSISTANT:  None.  ANESTHESIA:  General.  ESTIMATED BLOOD LOSS:  35 mL.  DRAINS:  None.  INDICATIONS:  Paul Paul is a 56 year old patient with left shoulder pain, who presents now for operative management of rotator cuff tear after explanation of risks and benefits.  OPERATIVE FINDINGS: 1. Examination under anesthesia, range of motion 0-180 forward     flexion, external rotation 50 degrees, abduction is about 70.     Shoulder stability is good,  anterior and posterior.  He has got     about 100 degrees of abduction. 2. Diagnostic arthroscopy.     a.     Mild synovitis around the biceps anchor, but with stable      biceps anchor.     b.     Torn rotator cuff tear, split-type tear, longitudinal      extending about 2 cm, partial thickness medially, full thickness      laterally.     c.     Bursitis type 1-2 acromion.  PROCEDURE IN DETAIL:  The patient was brought to the operating room, where general endotracheal anesthesia was induced.  Preop antibiotics were administered.  The patient was placed in a beach-chair position with the head in neutral position.  Left arm, shoulder, and hand were prescrubbed with alcohol and Betadine, which allowed to air dry, and prepped with DuraPrep solution and draped  in sterile manner.  Time-out was called.  [name] was used to cover the operative field and was used to cover the axilla.  The solution of epinephrine and saline was injected into subacromial space.  Solution of saline was injected to the joint. Posterior portal was created 2 cm medial and inferior to posterolateral margin of acromion.  Anterior port was created under direct visualization.  Diagnostic arthroscopy was performed.  Biceps anchor was stable.  Some fraying was present around the labrum, this was debrided. Some synovitis was present and that was also coagulated with the ArthroCare wand.  Rotator cuff tear was visualized.  There was a split- type tear of the supraspinatus with fraying and detachment distally. This was also debrided.  Subacromial space was then entered through a separate lateral portal.  The ArthroCare wand was used to release the CA ligament.  Subacromial decompression was performed with the bur.  At this time, 3 portals were closed using 3-0 nylon.  An incision was then made over the anterolateral margin of the acromion.  Skin and subcutaneous tissue were sharply divided.  The deltoid  was split and measured distance of 4 cm and marked with a #1 Vicryl suture.  Deltoid was split.  Subacromial decompression was adequate.  Rotator cuff tear was visualized.  In general, it was a de-attachment of the supraspinatus with a split tear extending about 2 cm medially.  The split tear was debrided and repaired using side-to-side 2-0 FiberWire sutures.  The distal portion of the detachment was closed after debriding the footprint with a curette using a single corkscrew with mattress sutures placed to close the leaves of the tear.  This gave a watertight repair. Then the 2 ends of the suture were then tacked into the humeral head with a PushLock.  Care was taken to avoid suturing the biceps tendon. At this time, the joint was thoroughly irrigated.  Deltoid split was repaired  using #1 Vicryl suture, followed by interrupted inverted 2-0 Vicryl suture and 3-0 pullout Prolene.  Steri-Strips and Benzoin were applied.  The patient tolerated procedure well without immediate complications.  Bulky dressing and shoulder immobilizer was placed.     Burnard Bunting, M.D.     GSD/MEDQ  D:  02/06/2012  T:  02/06/2012  Job:  [id number]

## 2012-02-07 NOTE — Evaluation (Signed)
Occupational Therapy Evaluation Patient Details Name: Paul Paul MRN: [id number] DOB: [birth year] Today's Date: 02/07/2012  Problem List:  Patient Active Problem List  Diagnoses  . DIABETES MELLITUS, TYPE II  . HYPERTENSION  . Cough  . History of pacemaker  . Cerebral aneurysm without rupture  . Preoperative clearance    Past Medical History:  Past Medical History  Diagnosis Date  . HYPERTENSION 12/10/2007  . Diabetes mellitus     now under control  . Pacemaker   . Bradycardia   . Dysrhythmia     BRADYCARDIA REQUIRING PACEMAKER .FOLLOWED BY [facility]GG TAYLOR  . Sleep apnea     CPAP.NOT WEARING NOW.NEEDS REPLACEMENT PIECE  . Arthritis   . Shoulder pain     L SHOULDER. RECENT FALL  . Shortness of breath     WITH EXERTION  . Anxiety    Past Surgical History:  Past Surgical History  Procedure Date  . Cardiac catheterization     normal  . Septal deviation   . Knee surgery     left; arthoscopic  . Insert / replace / remove pacemaker     *Please see shadow chart for full evaluation*  OT Assessment/Plan/Recommendation OT Recommendation Follow Up Recommendations: Home health OT;Supervision - Intermittent Equipment Recommended: None recommended by OT Pt. With increased pain limiting activity tolerance at this time.  OT Goals Acute Rehab OT Goals OT Goal Formulation: With patient Time For Goal Achievement: 7 days ADL Goals Pt Will Perform Grooming: with set-up;with supervision;Standing at sink ADL Goal: Grooming - Progress: Goal set today Pt Will Perform Upper Body Bathing: with set-up;with supervision;Standing at sink ADL Goal: Upper Body Bathing - Progress: Goal set today Pt Will Perform Upper Body Dressing: with set-up;with supervision;Sitting, bed ADL Goal: Upper Body Dressing - Progress: Goal set today Additional ADL Goal #1: Pt. will complete AROM distal of shoulder independently to decrease edema. ADL Goal: Additional Goal #1 - Progress: Goal set today  OT  Evaluation Precautions/Restrictions  Precautions Precautions: Shoulder Type of Shoulder Precautions: No AROM shoulder Required Braces or Orthoses: Yes Other Brace/Splint: Sling Restrictions Weight Bearing Restrictions: Yes LUE Weight Bearing: Non weight bearing Prior Functioning Home Living Lives With: Spouse Type of Home: House Home Layout: Multi-level Alternate Level Stairs-Rails: Left Alternate Level Stairs-Number of Steps: 6 Home Access: Stairs to enter Entrance Stairs-Rails: Left Entrance Stairs-Number of Steps: 2 Bathroom Shower/Tub: Tub/shower unit;Curtain Bathroom Toilet: Handicapped height Bathroom Accessibility: Yes How Accessible: Accessible via walker Home Adaptive Equipment: None Prior Function Level of Independence: Independent with basic ADLs;Independent with gait;Independent with transfers Able to Take Stairs?: Yes Driving: Yes     Extremity Assessment RUE Assessment RUE Assessment: Within Functional Limits LUE Assessment LUE Assessment: Exceptions to WFL LUE AROM (degrees) Overall AROM Left Upper Extremity:  (Distal of shoulder WFL AROM) Mobility  Bed Mobility Bed Mobility: Yes Rolling Right: 5: Supervision Right Sidelying to Sit: 5: Supervision Right Sidelying to Sit Details (indicate cue type and reason): Min verbal cues for hand placement Transfers Transfers: Yes Sit to Stand: 5: Supervision;With upper extremity assist;From bed Sit to Stand Details (indicate cue type and reason): Pt with use of right UE to achieve upright position   End of Session OT - End of Session Equipment Utilized During Treatment: Gait belt Activity Tolerance: Patient limited by pain Patient left: in bed;with call bell in reach Nurse Communication: Mobility status for transfers General Behavior During Session: [facility] for tasks performed Cognition: [facility] for tasks performed   Otto Felkins, OTR/L Pager [phone] 02/07/2012,  12:21 PM

## 2012-02-07 NOTE — Progress Notes (Signed)
ANTICOAGULATION CONSULT NOTE - Follow Up Consult  Pharmacy Consult for warfarin  Indication: VTE pxl  Allergies  Allergen Reactions  . Lisinopril     REACTION: rash  . Losartan Potassium-Hctz     REACTION: dizziness, fatigue  . Prednisone Other (See Comments)    Raises blood sugar  . Doxycycline Rash    Patient Measurements: Wt: 138.8 kg  Vital Signs: Temp: 97.6 F (36.4 C) (03/06 0616) BP: 151/84 mmHg (03/06 0616) Pulse Rate: 71  (03/06 0616)  Labs:  Alvira Philips 02/07/12 0657 02/06/12 1609  HGB -- --  HCT -- --  PLT -- --  APTT -- --  LABPROT 14.4 14.2  INR 1.10 1.08  HEPARINUNFRC -- --  CREATININE -- --  CKTOTAL -- --  CKMB -- --  TROPONINI -- --   The CrCl is unknown because both a height and weight (above a minimum accepted value) are required for this calculation.   Medications:  Scheduled:    . aspirin  81 mg Oral Daily  .  ceFAZolin (ANCEF) IV  1 g Intravenous Q6H  .  ceFAZolin (ANCEF) IV  2 g Intravenous 60 min Pre-Op  . cloNIDine  150 mcg Intra-articular To OR  . coumadin book   Does not apply Once  . methocarbamol(ROBAXIN) IV  500 mg Intravenous To PACU  . metoprolol succinate  100 mg Oral Daily  . morphine   Intravenous Q4H  . warfarin  7.5 mg Oral ONCE-1800  . warfarin   Does not apply Once  . Warfarin - Pharmacist Dosing Inpatient   Does not apply q1800  . DISCONTD: chlorhexidine  60 mL Topical Once  . DISCONTD: chlorhexidine  60 mL Topical Once    Assessment: 56 yo M s/p L shoulder arthroscopy w/open rotator cuff repair.  In essentially unmoved after 7.5 mg dose last pm.  Goal of Therapy:  INR 2-3   Plan:  1.  Warfarin 7.5 mg po X 1. 2.  Daily INR.  Emilya Justen L. Illene Bolus, PharmD, BCPS Clinical Pharmacist Pager: [phone] 02/07/2012 9:03 AM

## 2012-02-07 NOTE — Progress Notes (Signed)
Utilization review completed. Stephenson Cichy, RN, BSN. 02/07/12  

## 2012-02-07 NOTE — Progress Notes (Signed)
Occupational Therapy Treatment Patient Details Name: Paul Paul MRN: [id number] DOB: Nov 06, 1956 Today's Date: 02/07/2012  OT Assessment/Plan OT Assessment/Plan Comments on Treatment Session: Pt. progressing slowly Left UE movement due to increased pain. Pt. Tolerated left UE elbow, wrist, and digit  Flexion/extension AROM sitting and standing EOB with moderate cues for encouragement and breathing due to pain. Pt. Positioned in supine with left hand elevated with pillows due to increase swelling of palm aspect and digits. Encouraged pt. To continue AROM of digits and educated and performed retrograde massage of digits to decrease swelling.  *Will see pt. In A.M of 02/08/12.  Follow Up Recommendations: Home health OT;Supervision - Intermittent Equipment Recommended: None recommended by OT OT Goals Acute Rehab OT Goals OT Goal Formulation: With patient Time For Goal Achievement: 7 days ADL Goals Pt Will Perform Grooming: with set-up;with supervision;Standing at sink ADL Goal: Grooming - Progress: Goal set today Pt Will Perform Upper Body Bathing: with set-up;with supervision;Standing at sink ADL Goal: Upper Body Bathing - Progress: Goal set today Pt Will Perform Upper Body Dressing: with set-up;with supervision;Sitting, bed ADL Goal: Upper Body Dressing - Progress: Goal set today Additional ADL Goal #1: Pt. will complete AROM distal of shoulder independently to decrease edema. ADL Goal: Additional Goal #1 - Progress: Progressing toward goals  OT Treatment Precautions/Restrictions  Precautions Precautions: Shoulder Type of Shoulder Precautions: No AROM shoulder Required Braces or Orthoses: Yes Other Brace/Splint: Sling Restrictions Weight Bearing Restrictions: Yes LUE Weight Bearing: Non weight bearing   ADL ADL Toilet Transfer: Performed;Other (comment) (min guard assist) Toilet Transfer Method: Ambulating Mobility  Bed Mobility Bed Mobility: Yes Rolling Right: 5:  Supervision Right Sidelying to Sit: 5: Supervision Right Sidelying to Sit Details (indicate cue type and reason): Min verbal cues for hand placement Transfers Transfers: Yes Sit to Stand: 5: Supervision;With upper extremity assist;From bed Sit to Stand Details (indicate cue type and reason): Pt with use of right UE to achieve upright position Exercises Shoulder Exercises Elbow Flexion: AROM;10 reps;Left;Standing Elbow Extension: AROM;Left;10 reps;Standing Wrist Flexion: AROM;Left;20 reps;Standing Wrist Extension: AROM;15 reps;Left;Seated Digit Composite Flexion: AROM;Left;10 reps;Supine Composite Extension: AROM;Left;10 reps Neck Flexion: AROM;10 reps;Seated Neck Extension: AROM;5 reps;Seated Neck Lateral Flexion - Left: AROM;10 reps;Seated  End of Session OT - End of Session Equipment Utilized During Treatment: Gait belt Activity Tolerance: Patient limited by pain Patient left: in bed;with call bell in reach Nurse Communication: Mobility status for transfers General Behavior During Session: [facility] for tasks performed Cognition: [facility] for tasks performed  Chyna Kneece, OTR/L Pager [phone]  02/07/2012, 3:25 PM

## 2012-02-07 NOTE — Progress Notes (Signed)
Pt does not have good pain control after receiving Percocet and Robaxin. Notified Dr August Saucer this AM. New order to give Morphine PRN and to cancel D/C if pain not controlled by this afternoon.  Pain still not under control per pt and he wishes to stay until tomorrow. Will notify Dr August Saucer.

## 2012-02-08 LAB — PROTIME-INR: INR: 1.13 (ref 0.00–1.49)

## 2012-02-08 NOTE — Progress Notes (Signed)
Pt given D/C instructions and scripts. Pt D/Cd to home with wife.

## 2012-02-08 NOTE — Progress Notes (Signed)
Occupational Therapy Treatment Patient Details Name: Paul Paul MRN: [id number] DOB: [birth year] Today's Date: 02/08/2012  OT Assessment/Plan OT Assessment/Plan Comments on Treatment Session: Pt. much improved today and safe for D/C home.  OT Plan: Discharge plan remains appropriate Follow Up Recommendations: Home health OT;Supervision - Intermittent Equipment Recommended: None recommended by OT OT Goals Acute Rehab OT Goals OT Goal Formulation: With patient Time For Goal Achievement: 7 days ADL Goals Pt Will Perform Upper Body Dressing: with set-up;with supervision;Sitting, bed ADL Goal: Upper Body Dressing - Progress: Progressing toward goals Additional ADL Goal #1: Pt. will complete AROM distal of shoulder independently to decrease edema. ADL Goal: Additional Goal #1 - Progress: Progressing toward goals  OT Treatment Precautions/Restrictions  Precautions Precautions: Shoulder Type of Shoulder Precautions: No AROM shoulder Required Braces or Orthoses: Yes Other Brace/Splint: Sling Restrictions Weight Bearing Restrictions: Yes LUE Weight Bearing: Non weight bearing   ADL ADL Upper Body Dressing: Performed;Minimal assistance Upper Body Dressing Details (indicate cue type and reason): With donning zip up pullover for assisting sleeve up left arm Where Assessed - Upper Body Dressing: Sitting, bed Lower Body Dressing: Performed;Moderate assistance Lower Body Dressing Details (indicate cue type and reason): With donning undergarments/pants to pull on left side with wife assisting Where Assessed - Lower Body Dressing: Sit to stand from bed ADL Comments: Pt. able to don/doff sling with supervision and min verbal cues for technique and pt. able to recall dressing techniques.  Mobility  Bed Mobility Bed Mobility: Yes Rolling Right: 6: Modified independent (Device/Increase time) Right Sidelying to Sit: 6: Modified independent (Device/Increase time) Transfers Transfers: Yes Sit  to Stand: 7: Independent Exercises Shoulder Exercises Elbow Flexion: AROM;10 reps;Left;Standing Elbow Extension: AROM;Left;10 reps;Standing Wrist Flexion: AROM;Left;20 reps;Standing Wrist Extension: AROM;15 reps;Left;Seated Digit Composite Flexion: AROM;Left;10 reps;Supine Composite Extension: AROM;Left;10 reps Neck Flexion: AROM;10 reps;Seated Neck Extension: AROM;5 reps;Seated  End of Session OT - End of Session Equipment Utilized During Treatment: Gait belt Activity Tolerance: Patient limited by pain Patient left: in bed;with call bell in reach Nurse Communication: Mobility status for transfers General Behavior During Session: [facility] for tasks performed Cognition: [facility] for tasks performed  Tasnia Spegal, OTR/L Pager [phone]  02/08/2012, 9:43 AM

## 2012-02-14 NOTE — Discharge Summary (Signed)
Physician Discharge Summary  Patient ID: Paul Paul MRN: [id number] DOB/AGE: Aug 07, 1956 56 y.o.  Admit date: 02/06/2012 Discharge date: 02/08/2012  Admission Diagnoses:  Left shoulder rotator cuff tear  Discharge Diagnoses:  Same  Surgeries: Procedure(s): SHOULDER ARTHROSCOPY WITH OPEN ROTATOR CUFF REPAIR AND  ACROMINECTOMY on 02/06/2012   Consultants:    Discharged Condition: Stable  Hospital Course: Paul Paul is an 56 y.o. male who was admitted 02/06/2012 with a chief complaint of left shoulder pain, and found to have a diagnosis of rotator cuff tearThey were brought to the operating room on 02/06/2012 and underwent the above named procedures.    Antibiotics given:  Anti-infectives     Start     Dose/Rate Route Frequency Ordered Stop   02/06/12 1600   ceFAZolin (ANCEF) IVPB 1 g/50 mL premix        1 g 100 mL/hr over 30 Minutes Intravenous Every 6 hours 02/06/12 1534 02/07/12 0443   02/05/12 1500   ceFAZolin (ANCEF) IVPB 2 g/50 mL premix        2 g 100 mL/hr over 30 Minutes Intravenous 60 min pre-op 02/05/12 1455 02/06/12 0940        .  Recent vital signs:  Filed Vitals:   02/08/12 0523  BP: 146/82  Pulse: 58  Temp: 98.5 F (36.9 C)  Resp: 18    Recent laboratory studies:  Results for orders placed during the hospital encounter of 02/06/12  GLUCOSE, CAPILLARY      Component Value Range   Glucose-Capillary 142 (*) 70 - 99 (mg/dL)  GLUCOSE, CAPILLARY      Component Value Range   Glucose-Capillary 158 (*) 70 - 99 (mg/dL)  PROTIME-INR      Component Value Range   Prothrombin Time 14.2  11.6 - 15.2 (seconds)   INR 1.08  0.00 - 1.49   GLUCOSE, CAPILLARY      Component Value Range   Glucose-Capillary 150 (*) 70 - 99 (mg/dL)   Comment 1 Documented in Chart     Comment 2 Notify RN    PROTIME-INR      Component Value Range   Prothrombin Time 14.4  11.6 - 15.2 (seconds)   INR 1.10  0.00 - 1.49   GLUCOSE, CAPILLARY      Component Value Range   Glucose-Capillary 162 (*) 70 - 99 (mg/dL)  GLUCOSE, CAPILLARY      Component Value Range   Glucose-Capillary 118 (*) 70 - 99 (mg/dL)  GLUCOSE, CAPILLARY      Component Value Range   Glucose-Capillary 132 (*) 70 - 99 (mg/dL)   Comment 1 Notify RN    GLUCOSE, CAPILLARY      Component Value Range   Glucose-Capillary 153 (*) 70 - 99 (mg/dL)   Comment 1 Notify RN    PROTIME-INR      Component Value Range   Prothrombin Time 14.7  11.6 - 15.2 (seconds)   INR 1.13  0.00 - 1.49   GLUCOSE, CAPILLARY      Component Value Range   Glucose-Capillary 129 (*) 70 - 99 (mg/dL)  GLUCOSE, CAPILLARY      Component Value Range   Glucose-Capillary 136 (*) 70 - 99 (mg/dL)    Discharge Medications:   Medication List  As of 02/14/2012  8:21 AM   TAKE these medications         aspirin 81 MG chewable tablet   Chew 81 mg by mouth daily.      methocarbamol 500 MG tablet  Commonly known as: ROBAXIN   Take 1 tablet (500 mg total) by mouth 3 (three) times daily as needed.      metoprolol succinate 100 MG 24 hr tablet   Commonly known as: TOPROL-XL   Take 1 tablet (100 mg total) by mouth daily.      oxyCODONE-acetaminophen 5-325 MG per tablet   Commonly known as: PERCOCET   Take 2 tablets by mouth every 4 (four) hours as needed.            Diagnostic Studies: No results found.  Disposition: 01-Home or Self Care  Discharge Orders    Future Orders Please Complete By Expires   Diet - low sodium heart healthy      Call MD / Call 911      Comments:   If you experience chest pain or shortness of breath, CALL 911 and be transported to the hospital emergency room.  If you develope a fever above 101 F, pus (white drainage) or increased drainage or redness at the wound, or calf pain, call your surgeon's office.   Constipation Prevention      Comments:   Drink plenty of fluids.  Prune juice may be helpful.  You may use a stool softener, such as Colace (over the counter) 100 mg twice a day.  Use  MiraLax (over the counter) for constipation as needed.   Increase activity slowly as tolerated      Weight Bearing as taught in Physical Therapy      Comments:   Use a walker or crutches as instructed.   Discharge instructions      Comments:   1. Keep incision dry 2.CPM shoulder 1 hour today then 1 hour three times a day Thursday increase degrees daily         Signed: Tayia Stonesifer SCOTT 02/14/2012, 8:21 AM

## 2012-02-20 ENCOUNTER — Encounter: Payer: Managed Care, Other (non HMO) | Admitting: Internal Medicine

## 2012-02-22 ENCOUNTER — Ambulatory Visit: Payer: Managed Care, Other (non HMO) | Attending: Orthopedic Surgery | Admitting: Rehabilitation

## 2012-02-22 DIAGNOSIS — M25519 Pain in unspecified shoulder: Secondary | ICD-10-CM | POA: Insufficient documentation

## 2012-02-22 DIAGNOSIS — M25619 Stiffness of unspecified shoulder, not elsewhere classified: Secondary | ICD-10-CM | POA: Insufficient documentation

## 2012-02-22 DIAGNOSIS — IMO0001 Reserved for inherently not codable concepts without codable children: Secondary | ICD-10-CM | POA: Insufficient documentation

## 2012-02-26 ENCOUNTER — Encounter: Payer: Self-pay | Admitting: Gastroenterology

## 2012-02-28 ENCOUNTER — Other Ambulatory Visit: Payer: Self-pay | Admitting: *Deleted

## 2012-02-28 NOTE — Telephone Encounter (Signed)
Opened in error

## 2012-03-16 ENCOUNTER — Ambulatory Visit (INDEPENDENT_AMBULATORY_CARE_PROVIDER_SITE_OTHER): Payer: Managed Care, Other (non HMO) | Admitting: Family Medicine

## 2012-03-16 ENCOUNTER — Encounter: Payer: Self-pay | Admitting: Family Medicine

## 2012-03-16 VITALS — BP 146/90 | HR 70 | Temp 98.4°F | Wt 310.0 lb

## 2012-03-16 DIAGNOSIS — J209 Acute bronchitis, unspecified: Secondary | ICD-10-CM

## 2012-03-16 MED ORDER — AMOXICILLIN 500 MG PO CAPS: 1000.0000 mg | ORAL_CAPSULE | Freq: Two times a day (BID) | ORAL | Status: AC

## 2012-03-16 NOTE — Progress Notes (Signed)
  Patient Name: Paul Paul Date of Birth: 1956/10/01 Age: 56 y.o. Medical Record Number: [id number] Gender: male Date of Encounter: 03/16/2012  History of Present Illness:  DAMANY EASTMAN is a 56 y.o. very pleasant male patient who presents with the following:  Fever the other night around 100 Tylenol Cough is the worse, nose does not run that much.  Sleeping not that much - right now.   Pleasant gentleman presents with primary pulmonary complaints. He is not having much of a significant nasal drainage or nasal congestion at this point. No significant sore throat. No significant ear complaints. No nausea, vomiting, diarrhea. No rashes. Urinating normally without difficulty.  His cough is productive of sputum, and he has been intermittently febrile, and felt sweaty and hot  Past Medical History, Surgical History, Social History, Family History, Problem List, Medications, and Allergies have been reviewed and updated if relevant.  Review of Systems: ROS: GEN: Acute illness details above GI: Tolerating PO intake GU: maintaining adequate hydration and urination Pulm: No SOB Interactive and getting along well at home.  Otherwise, ROS is as per the HPI.   Physical Examination: Filed Vitals:   03/16/12 0928  BP: 146/90  Pulse: 70  Temp: 98.4 F (36.9 C)  TempSrc: Oral  Weight: 310 lb (140.615 kg)  SpO2: 94%    There is no height on file to calculate BMI.   GEN: A and O x 3. WDWN. NAD.    ENT: Nose clear, ext NML.  No LAD.  No JVD.  TM's clear. Oropharynx clear.  PULM: Normal WOB, no distress. No crackles, no wheezing. Scattered rare rhonchi CV: RRR, no M/G/R, No rubs, No JVD.   EXT: warm and well-perfused, No c/c/e. PSYCH: Pleasant and conversant.   Assessment and Plan:  1. Bronchitis, acute    Acute bronchitis: discussed plan of care. Given exam and overall history, will treat with ABX in this case for lung coverage. Continue with additional supportive care, cough  medications, liquids, sleep, steam / vaporizer.  Orders Today: No orders of the defined types were placed in this encounter.    Medications Today: Meds ordered this encounter  Medications  . amoxicillin (AMOXIL) 500 MG capsule    Sig: Take 2 capsules (1,000 mg total) by mouth 2 (two) times daily.    Dispense:  40 capsule    Refill:  0

## 2012-03-26 ENCOUNTER — Telehealth: Payer: Self-pay | Admitting: Internal Medicine

## 2012-03-26 NOTE — Telephone Encounter (Signed)
Pt calling re questions on device checks from home, how often, have we gotten any.Marland KitchenMarland Kitchen

## 2012-03-27 NOTE — Telephone Encounter (Signed)
Receiving Merlin transmissions. Not scheduled for any transmissions until after August appt.

## 2012-05-10 ENCOUNTER — Encounter: Payer: Self-pay | Admitting: Internal Medicine

## 2012-05-10 ENCOUNTER — Ambulatory Visit (INDEPENDENT_AMBULATORY_CARE_PROVIDER_SITE_OTHER): Payer: Managed Care, Other (non HMO) | Admitting: Internal Medicine

## 2012-05-10 VITALS — BP 130/90 | HR 88 | Temp 98.5°F | Wt 309.0 lb

## 2012-05-10 DIAGNOSIS — Z8679 Personal history of other diseases of the circulatory system: Secondary | ICD-10-CM

## 2012-05-10 DIAGNOSIS — I671 Cerebral aneurysm, nonruptured: Secondary | ICD-10-CM

## 2012-05-10 DIAGNOSIS — I10 Essential (primary) hypertension: Secondary | ICD-10-CM

## 2012-05-10 DIAGNOSIS — E119 Type 2 diabetes mellitus without complications: Secondary | ICD-10-CM

## 2012-05-10 DIAGNOSIS — Z950 Presence of cardiac pacemaker: Secondary | ICD-10-CM

## 2012-05-10 LAB — BASIC METABOLIC PANEL
Calcium: 9.3 mg/dL (ref 8.4–10.5)
GFR: 84.76 mL/min (ref >60.00)
Potassium: 4 mEq/L (ref 3.5–5.1)
Sodium: 137 mEq/L (ref 135–145)

## 2012-05-10 LAB — HEMOGLOBIN A1C: Hgb A1c MFr Bld: 9.2 % — ABNORMAL HIGH (ref 4.6–6.5)

## 2012-05-10 LAB — HEPATIC FUNCTION PANEL
AST: 73 U/L — ABNORMAL HIGH (ref 0–37)
Alkaline Phosphatase: 49 U/L (ref 39–117)
Total Bilirubin: 0.8 mg/dL (ref 0.3–1.2)

## 2012-05-10 LAB — LIPID PANEL
HDL: 30.6 mg/dL — ABNORMAL LOW (ref >39.00)
Triglycerides: 162 mg/dL — ABNORMAL HIGH (ref 0.0–149.0)
VLDL: 32.4 mg/dL (ref 0.0–40.0)

## 2012-05-10 NOTE — Assessment & Plan Note (Signed)
Discussed need for aggressive weight loss He has multiple excuses for reasons why he can't exercise--- although he will admit to trying more

## 2012-05-10 NOTE — Assessment & Plan Note (Signed)
Check labs today He should concentrate on aggressive weight loss He states he is eating vegetables and trying to exercise. I have explained to him the "calorie theory of weight loss"-- he needs to burn more calories than he consumes.

## 2012-05-10 NOTE — Assessment & Plan Note (Signed)
Has f/u with interventional radiology

## 2012-05-10 NOTE — Progress Notes (Signed)
patient comes in for followup of multiple medical problems including type 2 diabetes, hyperlipidemia, hypertension. The patient does not check blood sugar or blood pressure at home. The patetient does not follow an exercise or diet program. The patient denies any polyuria, polydipsia.  In the past the patient has gone to diabetic treatment center. The patient is tolerating medications  Without difficulty. The patient does admit to medication compliance (currently not on DM meds).  htn-- tolerating meds without difficulty  Cerebral aneurysm-- f/u with IR   Past Medical History  Diagnosis Date  . HYPERTENSION 12/10/2007  . Diabetes mellitus     now under control  . Pacemaker   . Bradycardia   . Dysrhythmia     BRADYCARDIA REQUIRING PACEMAKER .FOLLOWED BY [facility] TAYLOR  . Sleep apnea     CPAP.NOT WEARING NOW.NEEDS REPLACEMENT PIECE  . Arthritis   . Shoulder pain     L SHOULDER. RECENT FALL  . Shortness of breath     WITH EXERTION  . Anxiety     History   Social History  . Marital Status: Married    Spouse Name: N/A    Number of Children: N/A  . Years of Education: N/A   Occupational History  . Not on file.   Social History Main Topics  . Smoking status: Former Smoker    Types: Cigarettes    Quit date: 07/05/2011  . Smokeless tobacco: Not on file  . Alcohol Use: No  . Drug Use: No  . Sexually Active:    Other Topics Concern  . Not on file   Social History Narrative   Not smoking     Past Surgical History  Procedure Date  . Cardiac catheterization     normal  . Septal deviation   . Knee surgery     left; arthoscopic  . Insert / replace / remove pacemaker     Family History  Problem Relation Age of Onset  . Heart attack Mother   . Heart disease Mother   . Diabetes Mother   . Heart attack Father   . Heart disease Father     Allergies  Allergen Reactions  . Lisinopril     REACTION: rash  . Losartan Potassium-Hctz     REACTION: dizziness, fatigue  .  Prednisone Other (See Comments)    Raises blood sugar  . Doxycycline Rash    Current Outpatient Prescriptions on File Prior to Visit  Medication Sig Dispense Refill  . aspirin 81 MG chewable tablet Chew 81 mg by mouth daily.        . metoprolol succinate (TOPROL-XL) 100 MG 24 hr tablet Take 1 tablet (100 mg total) by mouth daily.  90 tablet  3     patient denies chest pain, shortness of breath, orthopnea. Denies lower extremity edema, abdominal pain, change in appetite, change in bowel movements. Patient denies rashes, musculoskeletal complaints. No other specific complaints in a complete review of systems.   BP 130/90  Pulse 88  Temp(Src) 98.5 F (36.9 C) (Oral)  Wt 309 lb (140.161 kg)  well-developed well-nourished male in no acute distress. HEENT exam atraumatic, normocephalic, neck supple without jugular venous distention. Chest clear to auscultation cardiac exam S1-S2 are regular. Abdominal exam overweight with bowel sounds, soft and nontender. Extremities no edema. Neurologic exam is alert with a normal gait.

## 2012-05-14 ENCOUNTER — Telehealth: Payer: Self-pay | Admitting: Gastroenterology

## 2012-05-14 MED ORDER — METFORMIN HCL 500 MG PO TABS: 500.0000 mg | ORAL_TABLET | Freq: Two times a day (BID) | ORAL | Status: DC

## 2012-05-14 NOTE — Telephone Encounter (Signed)
Patient informed of of Dr Marliss Coots orders and lab results. Was told to start Metformin 500 mg 1 twice daily sent in to patient's pharmacy. And patient was told to return in 4 months for OV, pt to call back. Patient stated he will start medication we he returns from vacation.

## 2012-05-28 ENCOUNTER — Other Ambulatory Visit (HOSPITAL_COMMUNITY): Payer: Self-pay | Admitting: Interventional Radiology

## 2012-05-28 DIAGNOSIS — Z09 Encounter for follow-up examination after completed treatment for conditions other than malignant neoplasm: Secondary | ICD-10-CM

## 2012-05-28 DIAGNOSIS — I729 Aneurysm of unspecified site: Secondary | ICD-10-CM

## 2012-06-17 ENCOUNTER — Telehealth: Payer: Self-pay | Admitting: Internal Medicine

## 2012-06-17 DIAGNOSIS — G4730 Sleep apnea, unspecified: Secondary | ICD-10-CM

## 2012-06-17 NOTE — Telephone Encounter (Signed)
Ok per Dr. Swords, referral order placed 

## 2012-06-17 NOTE — Telephone Encounter (Signed)
Patient called stating that he would like a referral for a sleep test to get a new cpap machine. Please advise.

## 2012-06-19 ENCOUNTER — Other Ambulatory Visit: Payer: Self-pay | Admitting: Radiology

## 2012-06-20 ENCOUNTER — Encounter (HOSPITAL_COMMUNITY): Payer: Self-pay | Admitting: Pharmacist

## 2012-06-25 ENCOUNTER — Ambulatory Visit (HOSPITAL_COMMUNITY)
Admission: RE | Admit: 2012-06-25 | Discharge: 2012-06-25 | Disposition: A | Discharge status: Home / Self Care | Payer: Managed Care, Other (non HMO) | Source: Ambulatory Visit | Attending: Interventional Radiology | Admitting: Interventional Radiology

## 2012-06-25 ENCOUNTER — Other Ambulatory Visit (HOSPITAL_COMMUNITY): Payer: Self-pay | Admitting: Interventional Radiology

## 2012-06-25 DIAGNOSIS — R51 Headache: Secondary | ICD-10-CM | POA: Insufficient documentation

## 2012-06-25 DIAGNOSIS — Z09 Encounter for follow-up examination after completed treatment for conditions other than malignant neoplasm: Secondary | ICD-10-CM

## 2012-06-25 DIAGNOSIS — I10 Essential (primary) hypertension: Secondary | ICD-10-CM | POA: Insufficient documentation

## 2012-06-25 DIAGNOSIS — E119 Type 2 diabetes mellitus without complications: Secondary | ICD-10-CM | POA: Insufficient documentation

## 2012-06-25 DIAGNOSIS — I729 Aneurysm of unspecified site: Secondary | ICD-10-CM

## 2012-06-25 DIAGNOSIS — I671 Cerebral aneurysm, nonruptured: Secondary | ICD-10-CM | POA: Insufficient documentation

## 2012-06-25 LAB — PROTIME-INR: INR: 1.05 (ref 0.00–1.49)

## 2012-06-25 LAB — BASIC METABOLIC PANEL
BUN: 10 mg/dL (ref 6–23)
Creatinine, Ser: 0.82 mg/dL (ref 0.50–1.35)
GFR calc Af Amer: >90 mL/min (ref >90)
GFR calc non Af Amer: >90 mL/min (ref >90)

## 2012-06-25 LAB — DIFFERENTIAL
Basophils Absolute: 0.1 10*3/uL (ref 0.0–0.1)
Basophils Relative: 2 % — ABNORMAL HIGH (ref 0–1)
Neutro Abs: 2.6 10*3/uL (ref 1.7–7.7)
Neutrophils Relative %: 43 % (ref 43–77)

## 2012-06-25 LAB — CBC
MCHC: 35.4 g/dL (ref 30.0–36.0)
Platelets: 239 10*3/uL (ref 150–400)
RDW: 13.4 % (ref 11.5–15.5)

## 2012-06-25 MED ORDER — SODIUM CHLORIDE 0.9 % IV SOLN: INTRAVENOUS | Status: AC

## 2012-06-25 MED ORDER — SODIUM CHLORIDE 0.9 % IV SOLN: Freq: Once | INTRAVENOUS | Status: DC

## 2012-06-25 MED ORDER — MIDAZOLAM HCL 5 MG/5ML IJ SOLN
INTRAMUSCULAR | Status: AC | PRN
  Administered 2012-06-25: 0.5 mg via INTRAVENOUS
  Administered 2012-06-25: 1 mg via INTRAVENOUS

## 2012-06-25 MED ORDER — MIDAZOLAM HCL 2 MG/2ML IJ SOLN
INTRAMUSCULAR | Status: AC
  Filled 2012-06-25: qty 4

## 2012-06-25 MED ORDER — FENTANYL CITRATE 0.05 MG/ML IJ SOLN
INTRAMUSCULAR | Status: AC
  Filled 2012-06-25: qty 4

## 2012-06-25 MED ORDER — HEPARIN SOD (PORK) LOCK FLUSH 100 UNIT/ML IV SOLN
INTRAVENOUS | Status: AC | PRN
  Administered 2012-06-25: 1000 [IU] via INTRAVENOUS

## 2012-06-25 MED ORDER — IOHEXOL 300 MG/ML  SOLN
150.0000 mL | Freq: Once | INTRAMUSCULAR | Status: AC | PRN
  Administered 2012-06-25: 78 mL via INTRAVENOUS

## 2012-06-25 MED ORDER — FENTANYL CITRATE 0.05 MG/ML IJ SOLN
INTRAMUSCULAR | Status: AC | PRN
  Administered 2012-06-25 (×2): 25 ug via INTRAVENOUS

## 2012-06-25 NOTE — ED Notes (Signed)
Dr D aware of BP

## 2012-06-25 NOTE — ED Notes (Signed)
5 [place] Exoseal closure by [employer], RT

## 2012-06-25 NOTE — H&P (Signed)
Paul Paul is an 56 y.o. male.   Chief Complaint: intracranial aneurysms - presents today for cerebral angiogram to evaluate stability.  HPI: From previous angiogram 2012 findings were -  Two small aneurysms arising in Paul right MCA trifurcation region, one measuring approximately 2.8 mm and one measuring approximately 1.6 mm, both of which appear to have a common neck of approximately 1.5 mm. Paul angiographic findings were reviewed with Paul Paul and Paul Paul's family. Given Paul smallness of Paul aneurysms, and Paul stability compared to Paul MRA examination of 2006, it was elected to pursue conservative management with controlling of Paul's high blood pressure, and dietary modification.  *RADIOLOGY REPORT*   RADIOLOGIST EVALUATION AND MANAGEMENT/OUTPATIENT CONSULTATION - [id number]   Chief complaint:  Vertigo, dizziness, blurred vision, cerebral aneurysm.   History of Present Illness: Paul Paul is a 56 year old white male who has been self referred to [facility] for further evaluation and possible treatment of a chronic right MCA trifurcation aneurysm.  Paul Paul was recently evaluated in Paul emergency room after reports of "seeing Paul road moving left to right to left again" associated with some blurred vision.  At Paul time, Paul Paul said his blood pressure was elevated ,reading approximately 180/110.  He denied any associated weakness, new onset numbness of Paul upper or lower extremities, or dysarthria.  CT of Paul head performed at that time revealed no acute intracranial abnormality.  Paul above symptoms were transient in nature.  Follow-up CT angiography of Paul head and neck on 11/16/2011 revealed a chronic right MCA trifurcation aneurysm measuring 3 x 6 x 5 mm which has not significantly changed since 2006.  There was a stable small infundibulum of Paul left PCA origin related to a diminutive P1 in Paul setting of a left fetal type PCA. A small  probable infundibulum at Paul left ophthalmic artery origin was noted.  There was no acute intracranial abnormality.  Following these findings, Paul Paul was subsequently discharged home from Paul ER with recommendations for neurosurgery follow-up and cardiology follow-up.  Paul Paul denies any history of prior stroke.   Past Medical History:  Significant for hypertension, bradycardia with prior pacemaker placement, sleep apnea, and Bell's palsy.  Paul Paul denies COPD, CHF, GERD, cancer or thyroid disorders.   Past Surgical History:  Pacemaker, septal surgery, left knee arthroscopy.   Allergies:  Lisinopril, Losartin potassium/hydrochlorothiazide, doxycycline.   Medications:  Aspirin 81 mg daily.   Social History:  Paul Paul is married and has one son.  He lives in [place].  Paul Paul quit smoking in August 2012.  He denies alcohol use.  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   Family History:  Significant for heart disease in mother and father, diabetes in mother.  There is no history of cerebral aneurysms in Paul family.   Review of Systems:  Paul Paul denies fevers, chills, chest pain, bleeding problems, nausea, vomiting, hematuria, dysuria, aphasia, dysarthria, dysphasia.  Paul Paul does report history of lack of energy, occasional coughing, occasional shoulder pain, dizziness, intermittent headaches, occasional blurred vision, occasional left arm paresthesias secondary to rotator cuff problems as well as short-term memory problems.   Assessment & Plan: Paul Paul is a 56 year old white male with history of vertigo, dizziness, intermittent episodes of blurred vision and history of chronic right MCA trifurcation aneurysm which has been stable since 2006.  Paul most recent CT angiogram of Paul head was reviewed with Paul Paul  and family by Dr. Corliss Skains. During Paul Paul's previous visit in Paul emergency  room, it was recommended that they follow up with Dr. Franky Macho of Neurosurgery for further evaluation of Paul above aneurysm.  However, both Paul Paul and his family declined to be seen by Neurosurgery.  Paul various treatment options for Paul above-mentioned cerebral aneurysm were discussed with Paul Paul and his family in great detail by Dr. Corliss Skains.  These include endovascular coiling/stenting, watchful waiting with follow-up imaging or  surgical clipping. Details and risk of endovascular coiling and/or stenting were discussed with Paul Paul.  These include, but are not limited to, internal bleeding, infection, stroke, renal failure and death. Following this discussion, Paul Paul has elected to proceed with endovascular coiling and/or stenting of Paul above right MCA trifurcation aneurysm.  Paul procedure has tentatively been scheduled for November 30, 2011 at [facility] with Dr. Corliss Skains.  Prior to Paul above intervention, Paul Paul will be placed on aspirin and Plavix for 3 days preprocedure.  Both Paul Paul and his family seemed to agree with Paul above plan and have no further questions at this time.  Paul Paul was told to contact our service in Paul interim should he have any further questions or concerns.   Original Report Authenticated By: Oneal Grout, M.D.      Examination for today's angiogram  :   Past Medical History  Diagnosis Date  . HYPERTENSION 12/10/2007  . Diabetes mellitus     now under control  . Pacemaker   . Bradycardia   . Dysrhythmia     BRADYCARDIA REQUIRING PACEMAKER .FOLLOWED BY Paul Paul  . Sleep apnea     CPAP.NOT WEARING NOW.NEEDS REPLACEMENT PIECE  . Arthritis   . Shoulder pain     L SHOULDER. RECENT FALL  . Shortness of breath     WITH EXERTION  . Anxiety     Past Surgical History  Procedure Date  . Cardiac catheterization     normal  . Septal deviation   . Knee surgery     left; arthoscopic  . Insert /  replace / remove pacemaker     Family History  Problem Relation Age of Onset  . Heart attack Mother   . Heart disease Mother   . Diabetes Mother   . Heart attack Father   . Heart disease Father    Social History:  reports that he quit smoking about a year ago. His smoking use included Cigarettes. He does not have any smokeless tobacco history on file. He reports that he does not drink alcohol or use illicit drugs.  Allergies:  Allergies  Allergen Reactions  . Lisinopril     REACTION: rash  . Losartan Potassium-Hctz     REACTION: dizziness, fatigue  . Doxycycline Rash    Results for orders placed during Paul hospital encounter of 06/25/12 (from Paul past 48 hour(s))  APTT     Status: Normal   Collection Time   06/25/12  7:03 AM      Component Value Range Comment   aPTT 27  24 - 37 seconds   BASIC METABOLIC PANEL     Status: Abnormal   Collection Time   06/25/12  7:03 AM      Component Value Range Comment   Sodium 136  135 - 145 mEq/L    Potassium 4.0  3.5 - 5.1 mEq/L    Chloride 101  96 - 112 mEq/L    CO2 25  19 -  32 mEq/L    Glucose, Bld 216 (*) 70 - 99 mg/dL    BUN 10  6 - 23 mg/dL    Creatinine, Ser 1.61  0.50 - 1.35 mg/dL    Calcium 9.4  8.4 - 09.6 mg/dL    GFR calc non Af Amer >90  >90 mL/min    GFR calc Af Amer >90  >90 mL/min   CBC     Status: Normal   Collection Time   06/25/12  7:03 AM      Component Value Range Comment   WBC 6.0  4.0 - 10.5 K/uL    RBC 5.41  4.22 - 5.81 MIL/uL    Hemoglobin 16.8  13.0 - 17.0 g/dL    HCT 04.5  40.9 - 81.1 %    MCV 87.8  78.0 - 100.0 fL    MCH 31.1  26.0 - 34.0 pg    MCHC 35.4  30.0 - 36.0 g/dL    RDW 91.4  78.2 - 95.6 %    Platelets 239  150 - 400 K/uL   DIFFERENTIAL     Status: Abnormal   Collection Time   06/25/12  7:03 AM      Component Value Range Comment   Neutrophils Relative 43  43 - 77 %    Neutro Abs 2.6  1.7 - 7.7 K/uL    Lymphocytes Relative 42  12 - 46 %    Lymphs Abs 2.5  0.7 - 4.0 K/uL    Monocytes  Relative 9  3 - 12 %    Monocytes Absolute 0.5  0.1 - 1.0 K/uL    Eosinophils Relative 5  0 - 5 %    Eosinophils Absolute 0.3  0.0 - 0.7 K/uL    Basophils Relative 2 (*) 0 - 1 %    Basophils Absolute 0.1  0.0 - 0.1 K/uL   PROTIME-INR     Status: Normal   Collection Time   06/25/12  7:03 AM      Component Value Range Comment   Prothrombin Time 13.9  11.6 - 15.2 seconds    INR 1.05  0.00 - 1.49    No results found.  Review of Systems  Constitutional: Negative for fever, chills, weight loss and malaise/fatigue.  HENT: Negative for hearing loss.   Eyes: Negative for blurred vision, double vision, photophobia and pain.  Respiratory: Negative for cough, hemoptysis and sputum production.   Cardiovascular: Negative for chest pain, palpitations and leg swelling.  Gastrointestinal: Negative for heartburn, nausea, vomiting and abdominal pain.  Genitourinary: Negative.   Musculoskeletal: Negative.   Skin: Negative.   Neurological: Negative.  Negative for headaches.  Endo/Heme/Allergies: Negative.   Psychiatric/Behavioral: Negative.     Blood pressure 150/96, pulse 60, temperature 97 F (36.1 C), temperature source Oral, resp. rate 18, height 6\' 3"  (1.905 m), weight 300 lb (136.079 kg), SpO2 98.00%. Physical Exam  Constitutional: He is oriented to person, place, and time. He appears well-developed and well-nourished. No distress.  HENT:  Head: Normocephalic and atraumatic.  Eyes: Pupils are equal, round, and reactive to light.  Cardiovascular: Normal rate and regular rhythm.  Exam reveals no gallop and no friction rub.   No murmur heard.      Bradycardic    Respiratory: Effort normal and breath sounds normal. No respiratory distress. He has no wheezes. He has no rales.  GI: Soft. Bowel sounds are normal.  Musculoskeletal: Normal range of motion. He exhibits no edema.  Neurological: He is  alert and oriented to person, place, and time.  Psychiatric: He has a normal mood and affect. His  behavior is normal. Judgment and thought content normal.     Assessment/Plan Paul presents today for formal angiogram to evaluate stability of recently noted aneurysms. He has been asymptomatic since that angiogram in regards to neurological status. Procedure details, benefits of angio over MRA and potential complications including but not limited to infection, bleeding, contrast allergy, stroke and complications with moderate sedation discussed with Paul Paul's apparent understanding. Labs have been reviewed and are WNL to proceed. Written consent obtained.   CAMPBELL,PAMELA D 06/25/2012, 8:21 AM

## 2012-06-25 NOTE — ED Notes (Signed)
MD at bedside.  Explaining findings 

## 2012-06-25 NOTE — Procedures (Signed)
S/P 4 vessel cerebral arteriogram  RT CFA approach. Preliminary findings  1. RT MCA aneurysm 5.68mm x 4.3 mm

## 2012-06-26 LAB — GLUCOSE, CAPILLARY: Glucose-Capillary: 217 mg/dL — ABNORMAL HIGH (ref 70–99)

## 2012-06-27 ENCOUNTER — Other Ambulatory Visit (HOSPITAL_COMMUNITY): Payer: Self-pay | Admitting: Interventional Radiology

## 2012-06-27 DIAGNOSIS — I729 Aneurysm of unspecified site: Secondary | ICD-10-CM

## 2012-07-02 ENCOUNTER — Telehealth (HOSPITAL_COMMUNITY): Payer: Self-pay

## 2012-07-02 NOTE — Telephone Encounter (Signed)
I called [name] after speaking with Paul Paul in regards to him being able to resume his walking.  She stated that as long as he felt ok and the site felt ok, he could walk.

## 2012-07-04 ENCOUNTER — Ambulatory Visit (HOSPITAL_COMMUNITY): Payer: Managed Care, Other (non HMO)

## 2012-07-05 ENCOUNTER — Other Ambulatory Visit (HOSPITAL_COMMUNITY): Payer: Self-pay | Admitting: Interventional Radiology

## 2012-07-05 ENCOUNTER — Ambulatory Visit (HOSPITAL_COMMUNITY)
Admission: RE | Admit: 2012-07-05 | Discharge: 2012-07-05 | Disposition: A | Discharge status: Home / Self Care | Payer: Managed Care, Other (non HMO) | Source: Ambulatory Visit | Attending: Interventional Radiology | Admitting: Interventional Radiology

## 2012-07-05 DIAGNOSIS — I729 Aneurysm of unspecified site: Secondary | ICD-10-CM

## 2012-07-08 ENCOUNTER — Encounter (HOSPITAL_COMMUNITY): Payer: Self-pay | Admitting: Pharmacy Technician

## 2012-07-09 ENCOUNTER — Ambulatory Visit (INDEPENDENT_AMBULATORY_CARE_PROVIDER_SITE_OTHER): Payer: Managed Care, Other (non HMO) | Admitting: Internal Medicine

## 2012-07-09 ENCOUNTER — Encounter: Payer: Self-pay | Admitting: Internal Medicine

## 2012-07-09 VITALS — BP 130/84 | HR 60 | Ht 75.0 in | Wt 301.1 lb

## 2012-07-09 DIAGNOSIS — I498 Other specified cardiac arrhythmias: Secondary | ICD-10-CM

## 2012-07-09 DIAGNOSIS — Z01810 Encounter for preprocedural cardiovascular examination: Secondary | ICD-10-CM

## 2012-07-09 DIAGNOSIS — Z8679 Personal history of other diseases of the circulatory system: Secondary | ICD-10-CM

## 2012-07-09 DIAGNOSIS — Z950 Presence of cardiac pacemaker: Secondary | ICD-10-CM

## 2012-07-09 LAB — PACEMAKER DEVICE OBSERVATION
AL AMPLITUDE: 5 mv
BATTERY VOLTAGE: 2.9629 V
DEVICE MODEL PM: 7250770
RV LEAD AMPLITUDE: 9.1 mv
RV LEAD IMPEDENCE PM: 562.5 Ohm
RV LEAD THRESHOLD: 1.5 V
VENTRICULAR PACING PM: 1

## 2012-07-09 NOTE — Assessment & Plan Note (Signed)
The patient is low risk for cardiovascular complications from his upcoming embolectomy. No special precautions or interogations of his PPM need to be undertaking prior to the procedure or after.

## 2012-07-09 NOTE — Progress Notes (Signed)
HPI Paul Paul returns today for followup. He is a pleasant 56 yo man with symptomatic bradycardia, s/p PPM, HTN, obesity and a worsening cerebral aneurysm. He is pending embolectomy. He denies chest pain or sob. He admits to being sedentary. No syncope.  Allergies  Allergen Reactions  . Lisinopril     REACTION: rash  . Losartan Potassium-Hctz     REACTION: dizziness, fatigue  . Prednisone Other (See Comments)    Raises blood sugar  . Doxycycline Rash     Current Outpatient Prescriptions  Medication Sig Dispense Refill  . aspirin EC 325 MG tablet Take 325 mg by mouth daily.      Marland Kitchen aspirin EC 81 MG tablet Take 81 mg by mouth every morning.      . clopidogrel (PLAVIX) 75 MG tablet Take 75 mg by mouth daily.      . metFORMIN (GLUCOPHAGE) 500 MG tablet Take 500 mg by mouth 2 (two) times daily with a meal.      . metoprolol succinate (TOPROL-XL) 100 MG 24 hr tablet Take 100 mg by mouth every morning.         Past Medical History  Diagnosis Date  . HYPERTENSION 12/10/2007  . Diabetes mellitus     now under control  . Pacemaker   . Bradycardia   . Dysrhythmia     BRADYCARDIA REQUIRING PACEMAKER .FOLLOWED BY Ritchie Klee  . Sleep apnea     CPAP.NOT WEARING NOW.NEEDS REPLACEMENT PIECE  . Arthritis   . Shoulder pain     L SHOULDER. RECENT FALL  . Shortness of breath     WITH EXERTION  . Anxiety     ROS:   All systems reviewed and negative except as noted in the HPI.   Past Surgical History  Procedure Date  . Cardiac catheterization     normal  . Septal deviation   . Knee surgery     left; arthoscopic  . Insert / replace / remove pacemaker      Family History  Problem Relation Age of Onset  . Heart attack Mother   . Heart disease Mother   . Diabetes Mother   . Heart attack Father   . Heart disease Father      History   Social History  . Marital Status: Married    Spouse Name: N/A    Number of Children: N/A  . Years of Education: N/A   Occupational  History  . Not on file.   Social History Main Topics  . Smoking status: Former Smoker    Types: Cigarettes    Quit date: 07/05/2011  . Smokeless tobacco: Not on file  . Alcohol Use: No  . Drug Use: No  . Sexually Active:    Other Topics Concern  . Not on file   Social History Narrative   Not smoking      BP 130/84  Pulse 60  Ht 6\' 3"  (1.905 m)  Wt 301 lb 1.9 oz (136.587 kg)  BMI 37.64 kg/m2  SpO2 97%  Physical Exam:  obese appearing middle aged man, NAD HEENT: Unremarkable Neck:  No JVD, no thyromegally Lungs:  Clear with no wheezes, rales, or rhonchi. Well healed PPM incision. HEART:  Regular rate rhythm, no murmurs, no rubs, no clicks Abd:  soft, positive bowel sounds, no organomegally, no rebound, no guarding Ext:  2 plus pulses, no edema, no cyanosis, no clubbing Skin:  No rashes no nodules Neuro:  CN II through XII intact, motor grossly  intact  DEVICE  Normal device function.  See PaceArt for details.   Assess/Plan:

## 2012-07-09 NOTE — Assessment & Plan Note (Signed)
His device is working normally. Will recheck in several months. 

## 2012-07-11 ENCOUNTER — Encounter (HOSPITAL_COMMUNITY): Payer: Self-pay | Admitting: Pharmacy Technician

## 2012-07-17 ENCOUNTER — Encounter (HOSPITAL_COMMUNITY)
Admission: RE | Admit: 2012-07-17 | Discharge: 2012-07-17 | Disposition: A | Discharge status: Home / Self Care | Payer: Managed Care, Other (non HMO) | Source: Ambulatory Visit | Attending: Interventional Radiology | Admitting: Interventional Radiology

## 2012-07-17 ENCOUNTER — Encounter (HOSPITAL_COMMUNITY): Payer: Self-pay

## 2012-07-17 LAB — CBC WITH DIFFERENTIAL/PLATELET
Eosinophils Absolute: 0.2 10*3/uL (ref 0.0–0.7)
Eosinophils Relative: 3 % (ref 0–5)
Hemoglobin: 16.7 g/dL (ref 13.0–17.0)
Lymphs Abs: 2.6 10*3/uL (ref 0.7–4.0)
MCH: 30.8 pg (ref 26.0–34.0)
MCV: 88.4 fL (ref 78.0–100.0)
Monocytes Relative: 8 % (ref 3–12)
RBC: 5.42 MIL/uL (ref 4.22–5.81)

## 2012-07-17 LAB — COMPREHENSIVE METABOLIC PANEL
Albumin: 3.5 g/dL (ref 3.5–5.2)
Alkaline Phosphatase: 48 U/L (ref 39–117)
BUN: 12 mg/dL (ref 6–23)
CO2: 23 mEq/L (ref 19–32)
Chloride: 102 mEq/L (ref 96–112)
GFR calc Af Amer: >90 mL/min (ref >90)
Glucose, Bld: 190 mg/dL — ABNORMAL HIGH (ref 70–99)
Potassium: 4 mEq/L (ref 3.5–5.1)
Total Bilirubin: 0.6 mg/dL (ref 0.3–1.2)

## 2012-07-17 NOTE — Progress Notes (Signed)
Reviewed chart verbally with Revonda Standard, [place] will review chart per MD. Dr. Ladona Ridgel has made note in cardiac clearance regarding device order. Copy placed on chart.

## 2012-07-17 NOTE — Pre-Procedure Instructions (Signed)
20 PERCY WINTERROWD  07/17/2012   Your procedure is scheduled on:  August 22  Report to [facility] at 07:30 AM.  Call this number if you have problems the morning of surgery: [phone]   Remember:   Do not eat food:After Midnight.  May have clear liquids:until Midnight .  Clear liquids include soda, tea, black coffee, apple or grape juice, broth.  Take these medicines the morning of surgery with A SIP OF WATER: Plavix, Aspirin, Metoprolol   Do not wear jewelry, make-up or nail polish.  Do not wear lotions, powders, or perfumes. You may wear deodorant.  Do not shave 48 hours prior to surgery. Men may shave face and neck.  Do not bring valuables to the hospital.  Contacts, dentures or bridgework may not be worn into surgery.  Leave suitcase in the car. After surgery it may be brought to your room.  For patients admitted to the hospital, checkout time is 11:00 AM the day of discharge.   Special Instructions: CHG Shower Use Special Wash: 1/2 bottle night before surgery and 1/2 bottle morning of surgery.   Please read over the following fact sheets that you were given: Pain Booklet, Coughing and Deep Breathing and Surgical Site Infection Prevention

## 2012-07-18 NOTE — Consult Note (Signed)
Anesthesia chart review: Patient is a 56 year old male scheduled for a cerebral arteriogram with possible right middle cerebral artery aneurysm coiling/stent placement by Dr. Corliss Skains on 07/22/2012.  His history includes HTN, DM2, OSA, OA, bradycardia with syncope s/p dual chamber St. Jude PPM (07/05/11) and former smoker.  He underwent rotator cuff repair on 02/06/12 and had carotid and vertebral angiogram under anesthesia on 11/30/11.  His Cardiologist is Dr. Ladona Ridgel.  He saw him on 07/09/12 and noted that patient was to have a procedure ("embolectomy" by his notes).  He felt Mr. Isakson was low risk for cardiovascular complications.  His CXR from 07/17/12 showed no evidence of acute cardiopulmonary disease.  He had PFTs done on 11/09/11 at [facility] (see Media tab) showing spirometry WNL.   His last EKG on 11/16/11 atrial paced rhythm with minimal voltage criteria for LVH, borderline inferior T wave abnormality (present since at least 07/06/11).     Dr. Ladona Ridgel is his primary EP Cardiologist. He had an echocardiogram and cardiac cath (under Notes tab) on 07/04/11. The echo showed normal LV systolic function, EF 55-65%.   His cath showed:done on 07/04/11 showing:  1. Significant disease in first diagonal branch at the ostium which was described on previous cardiac catheterization. Otherwise, there is mild coronary artery disease.  2. Normal LV systolic function.  3. No revascularization is advised. Recommend medical therapy.  Proceed with permanent pacemaker placement per Electrophysiology (done 07/05/11).  Aggressive treatment of risk factors is recommended for his mild coronary artery disease.  Labs noted.  Cr 0.79, glucose 190.  AST 59, ALT 83 (still elevated, but improved since 05/10/12).  His CBC and PT/PTT are WNL.  Anticipate he can proceed as planned.  We're still awaiting the PPM Rx form from [facility].  Shonna Chock, PA-C

## 2012-07-22 ENCOUNTER — Other Ambulatory Visit (HOSPITAL_COMMUNITY): Payer: Managed Care, Other (non HMO)

## 2012-07-22 ENCOUNTER — Ambulatory Visit (HOSPITAL_COMMUNITY)
Admission: RE | Admit: 2012-07-22 | Discharge: 2012-07-22 | Payer: Managed Care, Other (non HMO) | Source: Ambulatory Visit | Attending: Interventional Radiology | Admitting: Interventional Radiology

## 2012-07-23 ENCOUNTER — Other Ambulatory Visit: Payer: Self-pay | Admitting: Radiology

## 2012-07-25 ENCOUNTER — Inpatient Hospital Stay (HOSPITAL_COMMUNITY)
Admission: RE | Admit: 2012-07-25 | Discharge: 2012-07-26 | DRG: 027 | Disposition: A | Discharge status: Home / Self Care | Payer: Managed Care, Other (non HMO) | Source: Ambulatory Visit | Attending: Interventional Radiology | Admitting: Interventional Radiology

## 2012-07-25 ENCOUNTER — Encounter (HOSPITAL_COMMUNITY)
Admission: RE | Disposition: A | Discharge status: Home / Self Care | Payer: Self-pay | Source: Ambulatory Visit | Attending: Interventional Radiology

## 2012-07-25 ENCOUNTER — Encounter (HOSPITAL_COMMUNITY): Payer: Self-pay | Admitting: Vascular Surgery

## 2012-07-25 ENCOUNTER — Ambulatory Visit (HOSPITAL_COMMUNITY): Payer: Managed Care, Other (non HMO) | Admitting: Vascular Surgery

## 2012-07-25 ENCOUNTER — Encounter (HOSPITAL_COMMUNITY): Payer: Self-pay | Admitting: *Deleted

## 2012-07-25 ENCOUNTER — Ambulatory Visit (HOSPITAL_COMMUNITY)
Admission: RE | Admit: 2012-07-25 | Discharge: 2012-07-25 | Disposition: A | Discharge status: Home / Self Care | Payer: Managed Care, Other (non HMO) | Source: Ambulatory Visit | Attending: Interventional Radiology | Admitting: Interventional Radiology

## 2012-07-25 ENCOUNTER — Encounter (HOSPITAL_COMMUNITY): Payer: Self-pay

## 2012-07-25 DIAGNOSIS — E669 Obesity, unspecified: Secondary | ICD-10-CM | POA: Diagnosis present

## 2012-07-25 DIAGNOSIS — Z950 Presence of cardiac pacemaker: Secondary | ICD-10-CM

## 2012-07-25 DIAGNOSIS — Z6837 Body mass index (BMI) 37.0-37.9, adult: Secondary | ICD-10-CM

## 2012-07-25 DIAGNOSIS — I729 Aneurysm of unspecified site: Secondary | ICD-10-CM

## 2012-07-25 DIAGNOSIS — I10 Essential (primary) hypertension: Secondary | ICD-10-CM | POA: Diagnosis present

## 2012-07-25 DIAGNOSIS — Z87891 Personal history of nicotine dependence: Secondary | ICD-10-CM

## 2012-07-25 DIAGNOSIS — G4730 Sleep apnea, unspecified: Secondary | ICD-10-CM | POA: Diagnosis present

## 2012-07-25 DIAGNOSIS — I671 Cerebral aneurysm, nonruptured: Principal | ICD-10-CM | POA: Diagnosis present

## 2012-07-25 DIAGNOSIS — F411 Generalized anxiety disorder: Secondary | ICD-10-CM | POA: Diagnosis present

## 2012-07-25 LAB — POCT ACTIVATED CLOTTING TIME
Activated Clotting Time: 165 seconds
Activated Clotting Time: 179 seconds

## 2012-07-25 LAB — GLUCOSE, CAPILLARY

## 2012-07-25 SURGERY — RADIOLOGY WITH ANESTHESIA
Anesthesia: General

## 2012-07-25 MED ORDER — NIMODIPINE 30 MG PO CAPS
ORAL_CAPSULE | ORAL | Status: AC
  Administered 2012-07-25: 60 mg via ORAL
  Filled 2012-07-25: qty 2

## 2012-07-25 MED ORDER — PROPOFOL 10 MG/ML IV EMUL
INTRAVENOUS | Status: DC | PRN
  Administered 2012-07-25: 190 mg via INTRAVENOUS

## 2012-07-25 MED ORDER — LACTATED RINGERS IV SOLN
INTRAVENOUS | Status: DC
  Administered 2012-07-25: 09:00:00 via INTRAVENOUS

## 2012-07-25 MED ORDER — FENTANYL CITRATE 0.05 MG/ML IJ SOLN
INTRAMUSCULAR | Status: AC
  Filled 2012-07-25: qty 4

## 2012-07-25 MED ORDER — SODIUM CHLORIDE 0.9 % IV SOLN
INTRAVENOUS | Status: DC | PRN
  Administered 2012-07-25 (×2): via INTRAVENOUS

## 2012-07-25 MED ORDER — ONDANSETRON HCL 4 MG/2ML IJ SOLN: 4.0000 mg | Freq: Four times a day (QID) | INTRAMUSCULAR | Status: DC | PRN

## 2012-07-25 MED ORDER — MIDAZOLAM HCL 5 MG/5ML IJ SOLN
INTRAMUSCULAR | Status: DC | PRN
  Administered 2012-07-25: 2 mg via INTRAVENOUS

## 2012-07-25 MED ORDER — DEXTROSE 5 % IV SOLN
3.0000 g | Freq: Once | INTRAVENOUS | Status: AC
  Administered 2012-07-25: 3 g via INTRAVENOUS
  Filled 2012-07-25: qty 3000

## 2012-07-25 MED ORDER — NEOSTIGMINE METHYLSULFATE 1 MG/ML IJ SOLN
INTRAMUSCULAR | Status: DC | PRN
  Administered 2012-07-25: 2 mg via INTRAVENOUS
  Administered 2012-07-25: 3 mg via INTRAVENOUS

## 2012-07-25 MED ORDER — FENTANYL CITRATE 0.05 MG/ML IJ SOLN: 25.0000 ug | INTRAMUSCULAR | Status: DC | PRN

## 2012-07-25 MED ORDER — LACTATED RINGERS IV SOLN
INTRAVENOUS | Status: DC | PRN
  Administered 2012-07-25: 09:00:00 via INTRAVENOUS

## 2012-07-25 MED ORDER — IOHEXOL 300 MG/ML  SOLN
300.0000 mL | Freq: Once | INTRAMUSCULAR | Status: AC | PRN
  Administered 2012-07-25: 110 mL via INTRA_ARTERIAL

## 2012-07-25 MED ORDER — SODIUM CHLORIDE 0.9 % IV SOLN
INTRAVENOUS | Status: AC
  Administered 2012-07-25: 16:00:00 via INTRAVENOUS

## 2012-07-25 MED ORDER — GLYCOPYRROLATE 0.2 MG/ML IJ SOLN
INTRAMUSCULAR | Status: DC | PRN
  Administered 2012-07-25 (×2): .4 mg via INTRAVENOUS

## 2012-07-25 MED ORDER — ASPIRIN EC 325 MG PO TBEC
325.0000 mg | DELAYED_RELEASE_TABLET | Freq: Once | ORAL | Status: AC
  Filled 2012-07-25 (×2): qty 1

## 2012-07-25 MED ORDER — VECURONIUM BROMIDE 10 MG IV SOLR
INTRAVENOUS | Status: DC | PRN
  Administered 2012-07-25: 1 mg via INTRAVENOUS
  Administered 2012-07-25: 2 mg via INTRAVENOUS

## 2012-07-25 MED ORDER — CLOPIDOGREL BISULFATE 75 MG PO TABS
75.0000 mg | ORAL_TABLET | Freq: Every day | ORAL | Status: DC
  Administered 2012-07-26: 75 mg via ORAL
  Filled 2012-07-25 (×2): qty 1

## 2012-07-25 MED ORDER — SODIUM CHLORIDE 0.9 % IV SOLN: Freq: Once | INTRAVENOUS | Status: DC

## 2012-07-25 MED ORDER — HEPARIN (PORCINE) IN NACL 100-0.45 UNIT/ML-% IJ SOLN
1250.0000 [IU]/h | INTRAMUSCULAR | Status: AC
  Filled 2012-07-25: qty 250

## 2012-07-25 MED ORDER — FENTANYL CITRATE 0.05 MG/ML IJ SOLN
INTRAMUSCULAR | Status: DC | PRN
  Administered 2012-07-25 (×4): 100 ug via INTRAVENOUS

## 2012-07-25 MED ORDER — HEPARIN SODIUM (PORCINE) 1000 UNIT/ML IJ SOLN
INTRAMUSCULAR | Status: DC | PRN
  Administered 2012-07-25 (×4): 1000 [IU] via INTRAVENOUS
  Administered 2012-07-25: 3000 [IU] via INTRAVENOUS

## 2012-07-25 MED ORDER — INSULIN ASPART 100 UNIT/ML ~~LOC~~ SOLN
0.0000 [IU] | Freq: Three times a day (TID) | SUBCUTANEOUS | Status: DC
  Administered 2012-07-25 – 2012-07-26 (×2): 2 [IU] via SUBCUTANEOUS

## 2012-07-25 MED ORDER — ASPIRIN 325 MG PO TABS
325.0000 mg | ORAL_TABLET | Freq: Every day | ORAL | Status: DC
  Administered 2012-07-26: 325 mg via ORAL
  Filled 2012-07-25 (×2): qty 1

## 2012-07-25 MED ORDER — ONDANSETRON HCL 4 MG/2ML IJ SOLN
INTRAMUSCULAR | Status: DC | PRN
  Administered 2012-07-25: 4 mg via INTRAVENOUS

## 2012-07-25 MED ORDER — PHENYLEPHRINE HCL 10 MG/ML IJ SOLN
10.0000 mg | INTRAVENOUS | Status: DC | PRN
  Administered 2012-07-25: 5 ug/min via INTRAVENOUS

## 2012-07-25 MED ORDER — ACETAMINOPHEN 650 MG RE SUPP: 650.0000 mg | Freq: Four times a day (QID) | RECTAL | Status: DC | PRN

## 2012-07-25 MED ORDER — NIMODIPINE 30 MG PO CAPS
60.0000 mg | ORAL_CAPSULE | ORAL | Status: AC
  Administered 2012-07-25: 60 mg via ORAL

## 2012-07-25 MED ORDER — MIDAZOLAM HCL 2 MG/2ML IJ SOLN
INTRAMUSCULAR | Status: AC
  Filled 2012-07-25: qty 2

## 2012-07-25 MED ORDER — INSULIN ASPART 100 UNIT/ML ~~LOC~~ SOLN: 0.0000 [IU] | Freq: Every day | SUBCUTANEOUS | Status: DC

## 2012-07-25 MED ORDER — HEPARIN (PORCINE) IN NACL 100-0.45 UNIT/ML-% IJ SOLN
500.0000 [IU]/h | INTRAMUSCULAR | Status: DC
  Administered 2012-07-25: 500 [IU]/h via INTRAVENOUS
  Filled 2012-07-25: qty 250

## 2012-07-25 MED ORDER — HEPARIN (PORCINE) IN NACL 100-0.45 UNIT/ML-% IJ SOLN
500.0000 [IU]/h | INTRAMUSCULAR | Status: DC
  Filled 2012-07-25: qty 250

## 2012-07-25 MED ORDER — ROCURONIUM BROMIDE 100 MG/10ML IV SOLN
INTRAVENOUS | Status: DC | PRN
  Administered 2012-07-25: 50 mg via INTRAVENOUS

## 2012-07-25 MED ORDER — ACETAMINOPHEN 500 MG PO TABS: 1000.0000 mg | ORAL_TABLET | Freq: Four times a day (QID) | ORAL | Status: DC | PRN

## 2012-07-25 MED ORDER — SODIUM CHLORIDE 0.9 % IJ SOLN
1.5000 mg | INTRAVENOUS | Status: DC
  Filled 2012-07-25: qty 0.3

## 2012-07-25 MED ORDER — NICARDIPINE HCL IN NACL 20-0.86 MG/200ML-% IV SOLN
5.0000 mg/h | INTRAVENOUS | Status: DC
  Administered 2012-07-25: 5 mg/h via INTRAVENOUS
  Administered 2012-07-25: 8.5 mg/h via INTRAVENOUS
  Administered 2012-07-25: 5 mg/h via INTRAVENOUS
  Administered 2012-07-25: 7 mg/h via INTRAVENOUS
  Administered 2012-07-26: 5 mg/h via INTRAVENOUS
  Filled 2012-07-25 (×5): qty 200

## 2012-07-25 MED ORDER — CLOPIDOGREL BISULFATE 75 MG PO TABS
75.0000 mg | ORAL_TABLET | Freq: Once | ORAL | Status: AC
  Filled 2012-07-25: qty 1

## 2012-07-25 MED ORDER — PNEUMOCOCCAL VAC POLYVALENT 25 MCG/0.5ML IJ INJ
0.5000 mL | INJECTION | INTRAMUSCULAR | Status: AC
  Administered 2012-07-26: 0.5 mL via INTRAMUSCULAR
  Filled 2012-07-25: qty 0.5

## 2012-07-25 MED ORDER — LIDOCAINE HCL (CARDIAC) 20 MG/ML IV SOLN
INTRAVENOUS | Status: DC | PRN
  Administered 2012-07-25: 80 mg via INTRAVENOUS

## 2012-07-25 NOTE — Anesthesia Procedure Notes (Signed)
Procedure Name: Intubation Date/Time: 07/25/2012 10:15 AM Performed by: Rossie Muskrat L Pre-anesthesia Checklist: Patient identified, Patient being monitored, Timeout performed, Emergency Drugs available and Suction available Patient Re-evaluated:Patient Re-evaluated prior to inductionOxygen Delivery Method: Circle system utilized Preoxygenation: Pre-oxygenation with 100% oxygen Intubation Type: IV induction Ventilation: Mask ventilation without difficulty, Two handed mask ventilation required and Oral airway inserted - appropriate to patient size Laryngoscope Size: Miller and 2 Grade View: Grade I Tube type: Oral Tube size: 7.5 mm Number of attempts: 1 Airway Equipment and Method: Stylet Placement Confirmation: ETT inserted through vocal cords under direct vision,  breath sounds checked- equal and bilateral and positive ETCO2 Secured at: 23 cm Tube secured with: Tape Dental Injury: Teeth and Oropharynx as per pre-operative assessment

## 2012-07-25 NOTE — Anesthesia Postprocedure Evaluation (Signed)
  Anesthesia Post-op Note  Patient: Paul Paul  Procedure(s) Performed: Procedure(s) (LRB): RADIOLOGY WITH ANESTHESIA (N/A)  Patient Location: PACU  Anesthesia Type: General  Level of Consciousness: awake, alert , oriented and patient cooperative  Airway and Oxygen Therapy: Patient Spontanous Breathing and Patient connected to nasal cannula oxygen  Post-op Pain: none  Post-op Assessment: Post-op Vital signs reviewed, Patient's Cardiovascular Status Stable, Respiratory Function Stable, Patent Airway, No signs of Nausea or vomiting and Pain level controlled, remains on nicardipine as per Deveshwar  Post-op Vital Signs: Reviewed and stable  Complications: No apparent anesthesia complications

## 2012-07-25 NOTE — Progress Notes (Signed)
ANTICOAGULATION CONSULT NOTE - Initial Consult  Pharmacy Consult for heparin Indication: s/p staged embolization of  R MCA aneurysm  Allergies  Allergen Reactions  . Losartan Potassium-Hctz Other (See Comments)     dizziness, fatigue  . Prednisone Other (See Comments)    Raises blood sugar  . Doxycycline Rash  . Lisinopril Rash    Patient Measurements: Height: 6\' 3"  (190.5 cm) Weight: 301 lb 5.9 oz (136.7 kg) IBW/kg (Calculated) : 84.5  Heparin Dosing Weight:115 kg  Vital Signs: Temp: 97 F (36.1 C) (08/22 1430) Temp src: Oral (08/22 0714) BP: 118/66 mmHg (08/22 1600) Pulse Rate: 59  (08/22 1600)  Labs: No results found for this basename: HGB:2,HCT:3,PLT:3,APTT:3,LABPROT:3,INR:3,HEPARINUNFRC:3,CREATININE:3,CKTOTAL:3,CKMB:3,TROPONINI:3 in the last 72 hours  Estimated Creatinine Clearance: 151.9 ml/min (by C-G formula based on Cr of 0.79).   Medical History: Past Medical History  Diagnosis Date  . HYPERTENSION 12/10/2007  . Diabetes mellitus     now under control  . Pacemaker   . Bradycardia   . Dysrhythmia     BRADYCARDIA REQUIRING PACEMAKER .FOLLOWED BY GREGG TAYLOR  . Sleep apnea     CPAP.NOT WEARING NOW.NEEDS REPLACEMENT PIECE  . Arthritis   . Shoulder pain     L SHOULDER. RECENT FALL  . Shortness of breath     WITH EXERTION  . Anxiety     Medications:  Prescriptions prior to admission  Medication Sig Dispense Refill  . aspirin EC 325 MG tablet Take 325 mg by mouth daily.      Marland Kitchen aspirin EC 81 MG tablet Take 81 mg by mouth every morning.      . clopidogrel (PLAVIX) 75 MG tablet Take 75 mg by mouth daily.      . metFORMIN (GLUCOPHAGE) 500 MG tablet Take 500 mg by mouth 2 (two) times daily with a meal.      . metoprolol succinate (TOPROL-XL) 100 MG 24 hr tablet Take 100 mg by mouth every morning.        Assessment:  56 yo man s/p staged embolization of R MCA aneurysm to continue heparin drip until femoral sheath removed.  Heparin was started at 500  unit/hr in the [facility] at 1530 per protocol.  His heparin dosing weight is 115 kg. His total body weight is 136.7 kg. Goal of Therapy:  Heparin level 0.1 = 0.25 units/ml  Monitor platelets by anticoagulation protocol: Yes   Plan:  1. Increase heparin to 10 units/Kg/ABW = 1150 units/hr. 2. Check heparin level 6 hrs after rate change for goal 0.1-.25. 3. Am CBC ordered by MD 4. Heparin drip to be stopped at 0700 am and sheath removed  Herby Abraham, Pharm.D. [phone] 07/25/2012 4:43 PM

## 2012-07-25 NOTE — Progress Notes (Signed)
  Subjective: Post procedure.more awake ,alert . Denies any,nausea,vomitting,visual,motor ,speech or sensory changes.. Denies any chest pains or SOB. Tolerating liquids.po.  Objective: Vital signs in last 24 hours: Temp:  [97 F (36.1 C)-97.8 F (36.6 C)] 97 F (36.1 C) (08/22 1430) Pulse Rate:  [59-76] 59  (08/22 1600) Resp:  [8-21] 20  (08/22 1600) BP: (111-156)/(60-97) 118/66 mmHg (08/22 1600) SpO2:  [95 %-99 %] 96 % (08/22 1600) Arterial Line BP: (117-148)/(52-67) 125/63 mmHg (08/22 1600) Weight:  [301 lb 5.9 oz (136.7 kg)] 301 lb 5.9 oz (136.7 kg) (08/22 1500)    Intake/Output from previous day:   Intake/Output this shift: Total I/O In: 92.5 [I.V.:92.5] Out: 950 [Urine:850; Blood:100]  On examination VS  BP 130/60.mmhg. HR 60s to 70s ,SR. PaO2 96% on RA .  Neurologically Alert, awake oriented to time ,place and space.Marland Kitchen  Speech and comprehension clear.Marland Kitchen  PEARLA.Marland Kitchen3 mm RT = LT  EOMS full.Jill Alexanders Fields.to cnfrotation  No Facial asymmetry.  Tongue Midline..  Motor..           NO drift of outstretched arms..          Power.5/5 Proximally and distally all four extremities..  Fine motor and coordination to finger to nose equal..  Gait  NOt tested  Romberg Not tested  Heel to toe. Not tested.  Lab Results:  No results found for this basename: WBC:2,HGB:2,HCT:2,PLT:2 in the last 72 hours BMET No results found for this basename: NA:2,K:2,CL:2,CO2:2,GLUCOSE:2,BUN:2,CREATININE:2,CALCIUM:2 in the last 72 hours PT/INR No results found for this basename: LABPROT:2,INR:2 in the last 72 hours ABG No results found for this basename: PHART:2,PCO2:2,PO2:2,HCO3:2 in the last 72 hours  Studies/Results: No results found.  Anti-infectives: Anti-infectives    None      Assessment/Plan: s/p Staged embolization of RT MCA aneurysm Plan. Advance diet to liquid diet. Continue close neuro obs  And BP monitoring.. Continue with IV heparin. D/W patient  and wife.  Paul Trettin K 07/25/2012

## 2012-07-25 NOTE — Anesthesia Preprocedure Evaluation (Addendum)
Anesthesia Evaluation  Patient identified by MRN, date of birth, ID band Patient awake    Reviewed: Allergy & Precautions, H&P , NPO status , Patient's Chart, lab work & pertinent test results  Airway Mallampati: II  Neck ROM: full    Dental   Pulmonary shortness of breath, sleep apnea , former smoker,          Cardiovascular hypertension, + dysrhythmias + pacemaker     Neuro/Psych Anxiety Intracranial anyeurism    GI/Hepatic   Endo/Other  Type 2obese  Renal/GU      Musculoskeletal  (+) Arthritis -,   Abdominal   Peds  Hematology   Anesthesia Other Findings   Reproductive/Obstetrics                          Anesthesia Physical Anesthesia Plan  ASA: III  Anesthesia Plan: General   Post-op Pain Management:    Induction: Intravenous  Airway Management Planned: Oral ETT  Additional Equipment: Arterial line  Intra-op Plan:   Post-operative Plan: Extubation in OR  Informed Consent: I have reviewed the patients History and Physical, chart, labs and discussed the procedure including the risks, benefits and alternatives for the proposed anesthesia with the patient or authorized representative who has indicated his/her understanding and acceptance.     Plan Discussed with: CRNA and Surgeon  Anesthesia Plan Comments:         Anesthesia Quick Evaluation

## 2012-07-25 NOTE — Preoperative (Signed)
Beta Blockers   Reason not to administer Beta Blockers:Pt took B blocker 07/25/12 @ 0600

## 2012-07-25 NOTE — Transfer of Care (Signed)
Immediate Anesthesia Transfer of Care Note  Patient: Paul Paul  Procedure(s) Performed: Procedure(s) (LRB): RADIOLOGY WITH ANESTHESIA (N/A)  Patient Location: PACU  Anesthesia Type: General  Level of Consciousness: awake, alert  and oriented  Airway & Oxygen Therapy: Patient Spontanous Breathing and Patient connected to nasal cannula oxygen  Post-op Assessment: Report given to PACU RN, Post -op Vital signs reviewed and stable and Patient moving all extremities  Post vital signs: Reviewed and stable  Complications: No apparent anesthesia complications

## 2012-07-25 NOTE — Progress Notes (Signed)
Heparin initiated as patient arrived to PACU. Cannot scan due to error (can not administer in this encounter.).  Heparin gtt started at 500 units per hour.

## 2012-07-25 NOTE — Procedures (Signed)
S/P staged embolization  Of Rt MCA trifurcation 4.18mm x3.4 mm aneurysm

## 2012-07-25 NOTE — H&P (Signed)
Paul Paul is an 56 y.o. male.   Chief Complaint: Right Middle Cerebral Artery Aneurysm; enlarging Pt was seen for worsening headaches over 1 yr ago Abn MRI showed cerebral aneurysm Arteriogram 11/22/11 confirmed small R MCA aneurysm Re check arteriogram 05/28/12 revealed aneurysm to be enlarging Scheduled now for R MCA aneurysm coil/stent placement HPI: HTN; DM; pacemaker; sleep apnea; quit smoking 1 yr ago  Past Medical History  Diagnosis Date  . HYPERTENSION 12/10/2007  . Diabetes mellitus     now under control  . Pacemaker   . Bradycardia   . Dysrhythmia     BRADYCARDIA REQUIRING PACEMAKER .FOLLOWED BY GREGG TAYLOR  . Sleep apnea     CPAP.NOT WEARING NOW.NEEDS REPLACEMENT PIECE  . Arthritis   . Shoulder pain     L SHOULDER. RECENT FALL  . Shortness of breath     WITH EXERTION  . Anxiety     Past Surgical History  Procedure Date  . Cardiac catheterization     normal  . Septal deviation   . Knee surgery     left; arthoscopic  . Insert / replace / remove pacemaker   . Rotator cuff surgery     Family History  Problem Relation Age of Onset  . Heart attack Mother   . Heart disease Mother   . Diabetes Mother   . Heart attack Father   . Heart disease Father    Social History:  reports that he quit smoking about 12 months ago. His smoking use included Cigarettes. He has a 20 pack-year smoking history. He does not have any smokeless tobacco history on file. He reports that he does not drink alcohol or use illicit drugs.  Allergies:  Allergies  Allergen Reactions  . Losartan Potassium-Hctz Other (See Comments)     dizziness, fatigue  . Prednisone Other (See Comments)    Raises blood sugar  . Doxycycline Rash  . Lisinopril Rash     (Not in a hospital admission)  Results for orders placed during the hospital encounter of 07/25/12 (from the past 48 hour(s))  GLUCOSE, CAPILLARY     Status: Abnormal   Collection Time   07/25/12  7:18 AM      Component Value Range  Comment   Glucose-Capillary 165 (*) 70 - 99 mg/dL    No results found.  Review of Systems  Constitutional: Negative for fever and weight loss.  HENT:       Occasional  Respiratory: Negative for shortness of breath.        Sleep apnea  Cardiovascular: Negative for chest pain.  Gastrointestinal: Negative for nausea, vomiting and abdominal pain.  Neurological: Positive for headaches. Negative for dizziness and weakness.    There were no vitals taken for this visit. Physical Exam  Constitutional: He is oriented to person, place, and time. He appears well-developed and well-nourished.  Eyes: EOM are normal.  Cardiovascular: Normal rate, regular rhythm and normal heart sounds.   No murmur heard. Respiratory: Effort normal and breath sounds normal. He has no wheezes.  GI: Soft. Bowel sounds are normal. There is no tenderness.  Musculoskeletal: Normal range of motion.  Neurological: He is alert and oriented to person, place, and time.  Skin: Skin is warm and dry.  Psychiatric: He has a normal mood and affect. His behavior is normal. Judgment and thought content normal.     Assessment/Plan Hx headaches; abn MRI - then cerebral arteriogram 12/12 showed R MCA aneurysm Re check arteriogram 6/25 revealed aneurysm  to be enlarging  Now scheduled  for cerebral arteriogram with possible R MCA aneurysm coil/stent placement Pt and wife have good understanding of procedure benefits and risks and agreeable to proceed. Understands if we proceed he will be admitted to [facility] [facility] and spend night. DC in am if all is well and stable Consent signed and in chart  Othar Curto A 07/25/2012, 8:01 AM

## 2012-07-26 ENCOUNTER — Other Ambulatory Visit (HOSPITAL_COMMUNITY): Payer: Self-pay | Admitting: Interventional Radiology

## 2012-07-26 DIAGNOSIS — I729 Aneurysm of unspecified site: Secondary | ICD-10-CM

## 2012-07-26 LAB — CBC WITH DIFFERENTIAL/PLATELET
Basophils Absolute: 0.1 10*3/uL (ref 0.0–0.1)
Hemoglobin: 15 g/dL (ref 13.0–17.0)
MCH: 30.7 pg (ref 26.0–34.0)
MCHC: 34.8 g/dL (ref 30.0–36.0)
Neutrophils Relative %: 51 % (ref 43–77)

## 2012-07-26 LAB — GLUCOSE, CAPILLARY
Glucose-Capillary: 156 mg/dL — ABNORMAL HIGH (ref 70–99)
Glucose-Capillary: 134 mg/dL — ABNORMAL HIGH (ref 70–99)
Glucose-Capillary: 152 mg/dL — ABNORMAL HIGH (ref 70–99)

## 2012-07-26 LAB — BASIC METABOLIC PANEL
CO2: 24 mEq/L (ref 19–32)
Chloride: 105 mEq/L (ref 96–112)
Glucose, Bld: 138 mg/dL — ABNORMAL HIGH (ref 70–99)
Sodium: 138 mEq/L (ref 135–145)

## 2012-07-26 MED ORDER — SENNOSIDES-DOCUSATE SODIUM 8.6-50 MG PO TABS: 2.0000 | ORAL_TABLET | Freq: Every day | ORAL | Status: DC

## 2012-07-26 NOTE — Progress Notes (Signed)
ANTICOAGULATION CONSULT NOTE - Follow Up Consult  Pharmacy Consult for heparin Indication: s/p staged embolization of  R MCA aneurysm  Allergies  Allergen Reactions  . Losartan Potassium-Hctz Other (See Comments)     dizziness, fatigue  . Prednisone Other (See Comments)    Raises blood sugar  . Doxycycline Rash  . Lisinopril Rash    Patient Measurements: Height: 6\' 3"  (190.5 cm) Weight: 301 lb 5.9 oz (136.7 kg) IBW/kg (Calculated) : 84.5  Heparin Dosing Weight:115 kg  Vital Signs: Temp: 97.8 F (36.6 C) (08/22 1930) Temp src: Oral (08/22 1930) BP: 115/59 mmHg (08/22 2300) Pulse Rate: 59  (08/23 0000)  Labs:  Alvira Philips 07/25/12 2237  HGB --  HCT --  PLT --  APTT --  LABPROT --  INR --  HEPARINUNFRC <0.10*  CREATININE --  CKTOTAL --  CKMB --  TROPONINI --    Estimated Creatinine Clearance: 151.9 ml/min (by C-G formula based on Cr of 0.79).   Medical History: Past Medical History  Diagnosis Date  . HYPERTENSION 12/10/2007  . Diabetes mellitus     now under control  . Pacemaker   . Bradycardia   . Dysrhythmia     BRADYCARDIA REQUIRING PACEMAKER .FOLLOWED BY GREGG TAYLOR  . Sleep apnea     CPAP.NOT WEARING NOW.NEEDS REPLACEMENT PIECE  . Arthritis   . Shoulder pain     L SHOULDER. RECENT FALL  . Shortness of breath     WITH EXERTION  . Anxiety     Medications:  Prescriptions prior to admission  Medication Sig Dispense Refill  . aspirin EC 325 MG tablet Take 325 mg by mouth daily.      Marland Kitchen aspirin EC 81 MG tablet Take 81 mg by mouth every morning.      . clopidogrel (PLAVIX) 75 MG tablet Take 75 mg by mouth daily.      . metFORMIN (GLUCOPHAGE) 500 MG tablet Take 500 mg by mouth 2 (two) times daily with a meal.      . metoprolol succinate (TOPROL-XL) 100 MG 24 hr tablet Take 100 mg by mouth every morning.       Assessment: 56 yo man s/p staged embolization of R MCA aneurysm to continue heparin drip until femoral sheath removed.  Heparin was started at  500 unit/hr in the [facility] at 1530 per protocol.  His heparin dosing weight is 115 kg. His total body weight is 136.7 kg.  Heparin level (<0.1) is below-goal on 1150 units/hr. No problem with line / infusion per RN.   Goal of Therapy:  Heparin level 0.1 = 0.25 units/ml  Monitor platelets by anticoagulation protocol: Yes   Plan:  1. Increase IV heparin to 1250 units/hr 2. Heparin drip to be stopped at 0700 am for sheath removal  Lorre Munroe, PharmD 07/26/2012 12:05 AM

## 2012-07-26 NOTE — Progress Notes (Signed)
1 Day Post-Op  Subjective: Right middle cerebral artery aneurysm embolization/stent performed 8/22 Pt has done well overnight No complaints; "hungry"   Objective: Vital signs in last 24 hours: Temp:  [97 F (36.1 C)-98.1 F (36.7 C)] 98.1 F (36.7 C) (08/23 0700) Pulse Rate:  [59-76] 59  (08/23 0700) Resp:  [8-21] 15  (08/23 0700) BP: (100-126)/(58-75) 104/58 mmHg (08/23 0700) SpO2:  [94 %-99 %] 96 % (08/23 0700) Arterial Line BP: (117-148)/(52-70) 130/65 mmHg (08/23 0700) Weight:  [301 lb 5.9 oz (136.7 kg)] 301 lb 5.9 oz (136.7 kg) (08/22 1500)    Intake/Output from previous day: 08/22 0701 - 08/23 0700 In: 3638.2 [P.O.:610; I.V.:3028.2] Out: 4050 [Urine:3950; Blood:100] Intake/Output this shift:    PE:  A/O; appropriate VSS; afeb Face symmetrical; sl rt deficiency (Bells Palsy yrs ago) Smile = Moves all 4s; = and without pain Good strength; good movement Rt groin NT; no bleeding; no hematoma Rt foot: 2+ pulses  Lab Results:   Basename 07/26/12 0414  WBC 7.2  HGB 15.0  HCT 43.1  PLT 234   BMET  Basename 07/26/12 0414  NA 138  K 3.8  CL 105  CO2 24  GLUCOSE 138*  BUN 9  CREATININE 0.74  CALCIUM 9.1   PT/INR No results found for this basename: LABPROT:2,INR:2 in the last 72 hours ABG No results found for this basename: PHART:2,PCO2:2,PO2:2,HCO3:2 in the last 72 hours  Studies/Results: No results found.  Anti-infectives: Anti-infectives     Start     Dose/Rate Route Frequency Ordered Stop   07/25/12 0715   ceFAZolin (ANCEF) 3 g in dextrose 5 % 50 mL IVPB        3 g 160 mL/hr over 30 Minutes Intravenous  Once 07/25/12 0709 07/25/12 1008          Assessment/Plan: s/p Procedure(s) (LRB): RADIOLOGY WITH ANESTHESIA (N/A)  R MCA aneurysm embolization/stent Pt has done well Will increase diet; dc foley and art line; ambulate Plan to dc today. Report to Dr Julious Payer A 07/26/2012

## 2012-07-26 NOTE — Discharge Summary (Signed)
Physician Discharge Summary  Patient ID: Paul Paul MRN: [id number] DOB/AGE: [birth year] 57 y.o.  Admit date: 07/25/2012 Discharge date: 07/26/2012  Admission Diagnoses:  Headaches with unruptured Rt MCA trifurcation cerebral  aneurysm.   Discharge Diagnoses:  Active Problems:  Cerebral aneurysm without rupture  S/P staged embolization of RT MCA trifurcation cerebral aneurysm under general anesthesia.   Discharged Condition: Stable.   Hospital Course: Patient admitted for 24 hours observation s/p staged embolization of right MCA cerebral aneurysm measuring 4.5 x 3.4 mm under general anesthesia. Patient extubated in interventional radiology without difficulty.  He was placed on IV heparin overnight without any bleeding concerns.  His vitals remained stable during admission and he remained neurologically stable.  Groin sheath removed without difficulty or sign of hematoma.  Patient ambulated throughout NICU department without new neurological symptoms and groin site remained stable. Patient discharged to home with spouse on 325 mg ASA and 75 mg plavix until follow up appointment 08/08/12.   Consults: None.   Significant Diagnostic Studies: cerebral angiogram with staged embolization of RT MCA aneurysm. See full report in canopy.   Treatments: staged cerebral aneurysm embolization.    Discharge Exam: Blood pressure 109/60, pulse 58, temperature 98.4 F (36.9 C), temperature source Axillary, resp. rate 18, height 6\' 3"  (1.905 m), weight 301 lb 5.9 oz (136.7 kg), SpO2 95.00%.  Physical exam :  Denies headaches, visual changes, weakness, speech disturbances, nausea, vomiting or difficulty without swallowing. Has tolerated a regular diet.   General : Alert and oriented x 3.  HEENT:  Clear speech, tongue midline, PERRL. MS:  5/5 strength of all four extremities. Left hand and forearm with mild edema. Groin site with pressure bandage intact, site clean and dry without evidence of hematoma. 2+  RT DP pulses. No LE edema noted. Cardiovascular : RRR without murmurs, rubs or gallops Respiratory : CTA bilaterally  Disposition: 01-Home or Self Care  Discharge Orders    Future Appointments: Provider: Department: Dept Phone: Center:   10/14/2012 8:25 AM [facility] [phone] [facility]     Medication List  As of 07/26/2012 11:58 AM   STOP taking these medications         aspirin EC 81 MG tablet         TAKE these medications         aspirin EC 325 MG tablet   Take 325 mg by mouth daily.      clopidogrel 75 MG tablet   Commonly known as: PLAVIX   Take 75 mg by mouth daily.      metFORMIN 500 MG tablet   Commonly known as: GLUCOPHAGE   Take 500 mg by mouth 2 (two) times daily with a meal.      metoprolol succinate 100 MG 24 hr tablet   Commonly known as: TOPROL-XL   Take 100 mg by mouth every morning.      senna-docusate 8.6-50 MG per tablet   Commonly known as: Senokot-S   Take 2 tablets by mouth daily.           Follow-up Information    Follow up with Oneal Grout, MD on 08/08/2012. (1:00 pm.  Do not resume metformin until 07/27/12 am. )    Contact information:   Radiology department at [facility]  [phone]           Signed: CAMPBELL,PAMELA D 07/26/2012, 11:58 AM

## 2012-07-29 ENCOUNTER — Telehealth (HOSPITAL_COMMUNITY): Payer: Self-pay

## 2012-07-29 NOTE — Telephone Encounter (Signed)
i SPOKE WITH Paul Paul IN REGARDS TO THE 2 QUESTIONS HE HAD FOR DR. Corliss Skains.  HE WANTED TO KNOW WHEN HE COULD START ON THE TREADMILL AND WHEN COULD HE HAVE A SLEEP STUDY.  PER DR. Corliss Skains HE CAN START WALKING AFTER A WEEK AND HE CAN HAVE HIS SLEEP STUDY IN A WEEK.  HE HAD A CLEAR UNDERSTANDING/

## 2012-07-31 ENCOUNTER — Encounter (HOSPITAL_COMMUNITY): Payer: Self-pay

## 2012-08-06 ENCOUNTER — Telehealth (HOSPITAL_COMMUNITY): Payer: Self-pay

## 2012-08-06 NOTE — Telephone Encounter (Signed)
[name] called in to ask if he could start sexual activity again.  Per Dr. Corliss Skains yes he can.

## 2012-08-08 ENCOUNTER — Ambulatory Visit (HOSPITAL_COMMUNITY)
Admission: RE | Admit: 2012-08-08 | Discharge: 2012-08-08 | Disposition: A | Discharge status: Home / Self Care | Payer: Managed Care, Other (non HMO) | Source: Ambulatory Visit | Attending: Interventional Radiology | Admitting: Interventional Radiology

## 2012-08-08 DIAGNOSIS — I729 Aneurysm of unspecified site: Secondary | ICD-10-CM

## 2012-08-09 ENCOUNTER — Other Ambulatory Visit: Payer: Self-pay | Admitting: Internal Medicine

## 2012-08-09 DIAGNOSIS — E119 Type 2 diabetes mellitus without complications: Secondary | ICD-10-CM

## 2012-08-09 MED ORDER — METFORMIN HCL 500 MG PO TABS: 500.0000 mg | ORAL_TABLET | Freq: Two times a day (BID) | ORAL | Status: DC

## 2012-08-09 NOTE — Telephone Encounter (Signed)
rx sent in electronically 

## 2012-08-09 NOTE — Telephone Encounter (Signed)
Pt needs metformin 500 mg twice a day #180 for 3 month supply [facility]

## 2012-08-14 ENCOUNTER — Telehealth (HOSPITAL_COMMUNITY): Payer: Self-pay

## 2012-08-14 NOTE — Telephone Encounter (Signed)
I spoke with Dr. Corliss Skains in regards to Paul Paul calling and wanting to know if he cuold work out on the workout machines at the gym.  He advised no.  I let Mr. Deboy Know.  He gave a good understanding.

## 2012-08-19 ENCOUNTER — Other Ambulatory Visit (HOSPITAL_COMMUNITY): Payer: Self-pay | Admitting: Interventional Radiology

## 2012-08-19 DIAGNOSIS — I729 Aneurysm of unspecified site: Secondary | ICD-10-CM

## 2012-08-20 ENCOUNTER — Encounter (HOSPITAL_COMMUNITY): Payer: Self-pay | Admitting: Pharmacy Technician

## 2012-08-22 ENCOUNTER — Telehealth (HOSPITAL_COMMUNITY): Payer: Self-pay

## 2012-08-22 ENCOUNTER — Encounter (HOSPITAL_COMMUNITY): Payer: Self-pay

## 2012-08-22 ENCOUNTER — Encounter (HOSPITAL_COMMUNITY)
Admission: RE | Admit: 2012-08-22 | Discharge: 2012-08-22 | Disposition: A | Discharge status: Home / Self Care | Payer: Managed Care, Other (non HMO) | Source: Ambulatory Visit | Attending: Interventional Radiology | Admitting: Interventional Radiology

## 2012-08-22 HISTORY — DX: Cerebral aneurysm, nonruptured: I67.1

## 2012-08-22 HISTORY — DX: Constipation, unspecified: K59.00

## 2012-08-22 HISTORY — DX: Bell's palsy: G51.0

## 2012-08-22 LAB — CBC
MCHC: 34.8 g/dL (ref 30.0–36.0)
RDW: 13 % (ref 11.5–15.5)
WBC: 5.8 10*3/uL (ref 4.0–10.5)

## 2012-08-22 LAB — BASIC METABOLIC PANEL
Chloride: 105 mEq/L (ref 96–112)
GFR calc Af Amer: >90 mL/min (ref >90)
GFR calc non Af Amer: >90 mL/min (ref >90)
Potassium: 4 mEq/L (ref 3.5–5.1)
Sodium: 137 mEq/L (ref 135–145)

## 2012-08-22 NOTE — Telephone Encounter (Signed)
[name] called in to ask if it was ok for him to have his sleep study test Sunday.  Per Corliss Marcus he can.  It will not bother anything.

## 2012-08-22 NOTE — Pre-Procedure Instructions (Signed)
[name]  08/22/2012   Your procedure is scheduled on:  Wednesday, October 2nd.  Report to [facility] at 6:30 AM.  Call this number if you have problems the morning of surgery: [phone]   Remember:   Do not eat food or drink any liquid :After Midnight.   Take these medicines the morning of surgery with A SIP OF WATER: Metoprolol.   Do not wear jewelry, make-up or nail polish.  Do not wear lotions, powders, or perfumes. You may wear deodorant.  Do not shave 48 hours prior to surgery. Men may shave face and neck.  Do not bring valuables to the hospital.  Contacts, dentures or bridgework may not be worn into surgery.  Leave suitcase in the car. After surgery it may be brought to your room.  For patients admitted to the hospital, checkout time is 11:00 AM the day of discharge.   Patients discharged the day of surgery will not be allowed to drive home.  Name and phone number of your driver: _______________  Special Instructions: CHG Shower Shower 2 days before surgery and 1 day before surgery with Hibiclens.   Please read over the following fact sheets that you were given: Pain Booklet, Coughing and Deep Breathing and Surgical Site Infection Prevention

## 2012-08-22 NOTE — Progress Notes (Signed)
Pt has a history of sleep apnea, has a CPAP, but it is broken.  Pt has an appointment in [place] for another sleep study, last one was greater than 15 years.

## 2012-08-25 ENCOUNTER — Encounter (HOSPITAL_BASED_OUTPATIENT_CLINIC_OR_DEPARTMENT_OTHER): Payer: Managed Care, Other (non HMO)

## 2012-08-29 ENCOUNTER — Other Ambulatory Visit: Payer: Self-pay | Admitting: Radiology

## 2012-08-29 ENCOUNTER — Other Ambulatory Visit: Payer: Self-pay | Admitting: Physician Assistant

## 2012-09-03 ENCOUNTER — Other Ambulatory Visit: Payer: Self-pay | Admitting: Radiology

## 2012-09-03 MED ORDER — DEXTROSE 5 % IV SOLN
3.0000 g | Freq: Once | INTRAVENOUS | Status: AC
  Administered 2012-09-04: 3 g via INTRAVENOUS
  Filled 2012-09-03: qty 3000

## 2012-09-04 ENCOUNTER — Other Ambulatory Visit (HOSPITAL_COMMUNITY): Payer: Managed Care, Other (non HMO)

## 2012-09-04 ENCOUNTER — Ambulatory Visit (HOSPITAL_COMMUNITY): Payer: Managed Care, Other (non HMO) | Admitting: Anesthesiology

## 2012-09-04 ENCOUNTER — Encounter (HOSPITAL_COMMUNITY): Payer: Self-pay

## 2012-09-04 ENCOUNTER — Encounter (HOSPITAL_COMMUNITY): Payer: Self-pay | Admitting: Neurology

## 2012-09-04 ENCOUNTER — Inpatient Hospital Stay (HOSPITAL_COMMUNITY)
Admission: RE | Admit: 2012-09-04 | Discharge: 2012-09-05 | DRG: 027 | Disposition: A | Discharge status: Home / Self Care | Payer: Managed Care, Other (non HMO) | Source: Ambulatory Visit | Attending: Interventional Radiology | Admitting: Interventional Radiology

## 2012-09-04 ENCOUNTER — Ambulatory Visit (HOSPITAL_COMMUNITY)
Admission: RE | Admit: 2012-09-04 | Discharge: 2012-09-04 | Disposition: A | Discharge status: Home / Self Care | Payer: Managed Care, Other (non HMO) | Source: Ambulatory Visit | Attending: Interventional Radiology | Admitting: Interventional Radiology

## 2012-09-04 ENCOUNTER — Encounter (HOSPITAL_COMMUNITY): Payer: Self-pay | Admitting: Anesthesiology

## 2012-09-04 ENCOUNTER — Encounter (HOSPITAL_COMMUNITY)
Admission: RE | Disposition: A | Discharge status: Home / Self Care | Payer: Self-pay | Source: Ambulatory Visit | Attending: Interventional Radiology

## 2012-09-04 VITALS — BP 132/79 | HR 60 | Temp 97.9°F | Resp 20 | Ht 75.0 in | Wt 290.0 lb

## 2012-09-04 DIAGNOSIS — G252 Other specified forms of tremor: Secondary | ICD-10-CM | POA: Diagnosis not present

## 2012-09-04 DIAGNOSIS — G250 Essential tremor: Secondary | ICD-10-CM | POA: Diagnosis not present

## 2012-09-04 DIAGNOSIS — T41205A Adverse effect of unspecified general anesthetics, initial encounter: Secondary | ICD-10-CM | POA: Diagnosis not present

## 2012-09-04 DIAGNOSIS — Z950 Presence of cardiac pacemaker: Secondary | ICD-10-CM

## 2012-09-04 DIAGNOSIS — I671 Cerebral aneurysm, nonruptured: Principal | ICD-10-CM | POA: Diagnosis present

## 2012-09-04 DIAGNOSIS — I729 Aneurysm of unspecified site: Secondary | ICD-10-CM

## 2012-09-04 DIAGNOSIS — I10 Essential (primary) hypertension: Secondary | ICD-10-CM

## 2012-09-04 DIAGNOSIS — Y9210 Unspecified residential institution as the place of occurrence of the external cause: Secondary | ICD-10-CM | POA: Diagnosis not present

## 2012-09-04 DIAGNOSIS — Z01810 Encounter for preprocedural cardiovascular examination: Secondary | ICD-10-CM

## 2012-09-04 DIAGNOSIS — E119 Type 2 diabetes mellitus without complications: Secondary | ICD-10-CM

## 2012-09-04 DIAGNOSIS — I498 Other specified cardiac arrhythmias: Secondary | ICD-10-CM

## 2012-09-04 LAB — DIFFERENTIAL
Lymphs Abs: 2.8 10*3/uL (ref 0.7–4.0)
Monocytes Relative: 8 % (ref 3–12)
Neutro Abs: 3 10*3/uL (ref 1.7–7.7)
Neutrophils Relative %: 44 % (ref 43–77)

## 2012-09-04 LAB — PROTIME-INR
INR: 0.95 (ref 0.00–1.49)
Prothrombin Time: 12.6 seconds (ref 11.6–15.2)

## 2012-09-04 LAB — POCT ACTIVATED CLOTTING TIME
Activated Clotting Time: 159 seconds
Activated Clotting Time: 159 seconds

## 2012-09-04 LAB — GLUCOSE, CAPILLARY: Glucose-Capillary: 117 mg/dL — ABNORMAL HIGH (ref 70–99)

## 2012-09-04 LAB — APTT: aPTT: 29 seconds (ref 24–37)

## 2012-09-04 LAB — HEPARIN LEVEL (UNFRACTIONATED): Heparin Unfractionated: 0.18 IU/mL — ABNORMAL LOW (ref 0.30–0.70)

## 2012-09-04 SURGERY — RADIOLOGY WITH ANESTHESIA
Anesthesia: General | Site: Eye

## 2012-09-04 MED ORDER — SODIUM CHLORIDE 0.9 % IV SOLN: INTRAVENOUS | Status: DC

## 2012-09-04 MED ORDER — FENTANYL CITRATE 0.05 MG/ML IJ SOLN: 25.0000 ug | INTRAMUSCULAR | Status: DC | PRN

## 2012-09-04 MED ORDER — HEPARIN (PORCINE) IN NACL 100-0.45 UNIT/ML-% IJ SOLN
500.0000 [IU]/h | INTRAMUSCULAR | Status: DC
  Administered 2012-09-04: 500 [IU]/h via INTRAVENOUS
  Filled 2012-09-04: qty 250

## 2012-09-04 MED ORDER — ACETAMINOPHEN 500 MG PO TABS
1000.0000 mg | ORAL_TABLET | Freq: Four times a day (QID) | ORAL | Status: DC | PRN
  Administered 2012-09-04 – 2012-09-05 (×2): 1000 mg via ORAL
  Filled 2012-09-04 (×2): qty 2

## 2012-09-04 MED ORDER — NICARDIPINE HCL IN NACL 20-0.86 MG/200ML-% IV SOLN
5.0000 mg/h | INTRAVENOUS | Status: DC
  Administered 2012-09-04: 6 mg/h via INTRAVENOUS
  Administered 2012-09-04 (×3): 5 mg/h via INTRAVENOUS
  Administered 2012-09-05 (×2): 6 mg/h via INTRAVENOUS
  Filled 2012-09-04 (×7): qty 200

## 2012-09-04 MED ORDER — SODIUM CHLORIDE 0.9 % IV SOLN
INTRAVENOUS | Status: AC
  Administered 2012-09-04: 75 mL/h via INTRAVENOUS

## 2012-09-04 MED ORDER — LIDOCAINE HCL (CARDIAC) 20 MG/ML IV SOLN
INTRAVENOUS | Status: DC | PRN
  Administered 2012-09-04: 100 mg via INTRAVENOUS

## 2012-09-04 MED ORDER — METOPROLOL SUCCINATE ER 100 MG PO TB24
100.0000 mg | ORAL_TABLET | Freq: Every morning | ORAL | Status: DC
  Administered 2012-09-05: 100 mg via ORAL
  Filled 2012-09-04: qty 1

## 2012-09-04 MED ORDER — ASPIRIN 325 MG PO TABS
325.0000 mg | ORAL_TABLET | Freq: Every day | ORAL | Status: DC
  Administered 2012-09-05: 325 mg via ORAL
  Filled 2012-09-04 (×2): qty 1

## 2012-09-04 MED ORDER — ONDANSETRON HCL 4 MG/2ML IJ SOLN: 4.0000 mg | Freq: Four times a day (QID) | INTRAMUSCULAR | Status: DC | PRN

## 2012-09-04 MED ORDER — NIMODIPINE 30 MG PO CAPS
60.0000 mg | ORAL_CAPSULE | ORAL | Status: AC
  Administered 2012-09-04: 60 mg via ORAL
  Filled 2012-09-04: qty 2

## 2012-09-04 MED ORDER — ASPIRIN EC 325 MG PO TBEC
DELAYED_RELEASE_TABLET | ORAL | Status: AC
  Filled 2012-09-04: qty 1

## 2012-09-04 MED ORDER — FENTANYL CITRATE 0.05 MG/ML IJ SOLN
INTRAMUSCULAR | Status: DC | PRN
  Administered 2012-09-04: 50 ug via INTRAVENOUS
  Administered 2012-09-04 (×2): 100 ug via INTRAVENOUS

## 2012-09-04 MED ORDER — NEOSTIGMINE METHYLSULFATE 1 MG/ML IJ SOLN
INTRAMUSCULAR | Status: DC | PRN
  Administered 2012-09-04: 4 mg via INTRAVENOUS

## 2012-09-04 MED ORDER — MIDAZOLAM HCL 5 MG/5ML IJ SOLN
INTRAMUSCULAR | Status: DC | PRN
  Administered 2012-09-04: 2 mg via INTRAVENOUS

## 2012-09-04 MED ORDER — ACETAMINOPHEN 650 MG RE SUPP: 650.0000 mg | Freq: Four times a day (QID) | RECTAL | Status: DC | PRN

## 2012-09-04 MED ORDER — PROPOFOL 10 MG/ML IV BOLUS
INTRAVENOUS | Status: DC | PRN
  Administered 2012-09-04: 200 mg via INTRAVENOUS

## 2012-09-04 MED ORDER — INFLUENZA VIRUS VACC SPLIT PF IM SUSP
0.5000 mL | INTRAMUSCULAR | Status: AC
  Administered 2012-09-05: 0.5 mL via INTRAMUSCULAR
  Filled 2012-09-04: qty 0.5

## 2012-09-04 MED ORDER — ASPIRIN EC 325 MG PO TBEC
325.0000 mg | DELAYED_RELEASE_TABLET | ORAL | Status: AC
  Administered 2012-09-04: 325 mg via ORAL

## 2012-09-04 MED ORDER — CLOPIDOGREL BISULFATE 75 MG PO TABS
75.0000 mg | ORAL_TABLET | Freq: Every day | ORAL | Status: DC
  Administered 2012-09-05: 75 mg via ORAL
  Filled 2012-09-04 (×2): qty 1

## 2012-09-04 MED ORDER — NIMODIPINE 30 MG PO CAPS
ORAL_CAPSULE | ORAL | Status: AC
  Filled 2012-09-04: qty 2

## 2012-09-04 MED ORDER — GLYCOPYRROLATE 0.2 MG/ML IJ SOLN
INTRAMUSCULAR | Status: DC | PRN
  Administered 2012-09-04: .7 mg via INTRAVENOUS

## 2012-09-04 MED ORDER — HEPARIN (PORCINE) IN NACL 100-0.45 UNIT/ML-% IJ SOLN
1100.0000 [IU]/h | INTRAMUSCULAR | Status: DC
  Administered 2012-09-04: 1100 [IU]/h via INTRAVENOUS
  Filled 2012-09-04 (×2): qty 250

## 2012-09-04 MED ORDER — HEPARIN SODIUM (PORCINE) 1000 UNIT/ML IJ SOLN
INTRAMUSCULAR | Status: DC | PRN
  Administered 2012-09-04 (×3): 1000 [IU] via INTRAVENOUS
  Administered 2012-09-04: 500 [IU] via INTRAVENOUS
  Administered 2012-09-04: 3000 [IU] via INTRAVENOUS

## 2012-09-04 MED ORDER — LACTATED RINGERS IV SOLN
INTRAVENOUS | Status: DC | PRN
  Administered 2012-09-04: 08:00:00 via INTRAVENOUS

## 2012-09-04 MED ORDER — SODIUM CHLORIDE 0.9 % IJ SOLN
1.5000 mg | INTRAMUSCULAR | Status: DC
  Filled 2012-09-04: qty 0.3

## 2012-09-04 MED ORDER — IOHEXOL 300 MG/ML  SOLN
150.0000 mL | Freq: Once | INTRAMUSCULAR | Status: AC | PRN
  Administered 2012-09-04: 110 mL via INTRA_ARTERIAL

## 2012-09-04 MED ORDER — VECURONIUM BROMIDE 10 MG IV SOLR
INTRAVENOUS | Status: DC | PRN
  Administered 2012-09-04: 2 mg via INTRAVENOUS

## 2012-09-04 MED ORDER — ROCURONIUM BROMIDE 100 MG/10ML IV SOLN
INTRAVENOUS | Status: DC | PRN
  Administered 2012-09-04: 50 mg via INTRAVENOUS

## 2012-09-04 MED ORDER — ONDANSETRON HCL 4 MG/2ML IJ SOLN
INTRAMUSCULAR | Status: DC | PRN
  Administered 2012-09-04: 4 mg via INTRAVENOUS

## 2012-09-04 NOTE — Procedures (Signed)
S/P rt common carotid arteriogram followed by coiling of intracranial aneurysm

## 2012-09-04 NOTE — Anesthesia Procedure Notes (Signed)
Procedure Name: Intubation Date/Time: 09/04/2012 9:05 AM Performed by: Rossie Muskrat L Pre-anesthesia Checklist: Patient identified, Patient being monitored, Timeout performed, Emergency Drugs available and Suction available Patient Re-evaluated:Patient Re-evaluated prior to inductionOxygen Delivery Method: Circle system utilized Preoxygenation: Pre-oxygenation with 100% oxygen Intubation Type: IV induction Ventilation: Mask ventilation without difficulty and Oral airway inserted - appropriate to patient size Laryngoscope Size: Miller and 2 Tube type: Oral Tube size: 7.5 mm Number of attempts: 1 Airway Equipment and Method: Stylet Placement Confirmation: ETT inserted through vocal cords under direct vision,  breath sounds checked- equal and bilateral and positive ETCO2 Secured at: 23 cm Tube secured with: Tape Dental Injury: Teeth and Oropharynx as per pre-operative assessment

## 2012-09-04 NOTE — Transfer of Care (Signed)
Immediate Anesthesia Transfer of Care Note  Patient: Paul Paul  Procedure(s) Performed: Procedure(s) (LRB) with comments: RADIOLOGY WITH ANESTHESIA (N/A) - coiling possible stenting   Patient Location: PACU  Anesthesia Type: General  Level of Consciousness: awake, alert  and oriented  Airway & Oxygen Therapy: Patient Spontanous Breathing and Patient connected to nasal cannula oxygen  Post-op Assessment: Report given to PACU RN, Post -op Vital signs reviewed and stable and Patient moving all extremities  Post vital signs: Reviewed and stable  Complications: No apparent anesthesia complications

## 2012-09-04 NOTE — Preoperative (Signed)
Beta Blockers   Reason not to administer Beta Blockers:B blocker taken 09/04/12 @ 0500

## 2012-09-04 NOTE — Anesthesia Postprocedure Evaluation (Signed)
Anesthesia Post Note  Patient: Paul Paul  Procedure(s) Performed: Procedure(s) (LRB): RADIOLOGY WITH ANESTHESIA (N/A)  Anesthesia type: General  Patient location: PACU  Post pain: Pain level controlled and Adequate analgesia  Post assessment: Post-op Vital signs reviewed, Patient's Cardiovascular Status Stable, Respiratory Function Stable, Patent Airway and Pain level controlled  Last Vitals:  Filed Vitals:   09/04/12 1200  Pulse: 70  Temp:   Resp: 19    Post vital signs: Reviewed and stable  Level of consciousness: awake, alert  and oriented  Complications: No apparent anesthesia complications

## 2012-09-04 NOTE — Progress Notes (Signed)
ANTICOAGULATION CONSULT NOTE - Follow Up Consult  Pharmacy Consult for UFH  Indication: s/p cerebral aneurysm coiling  Allergies  Allergen Reactions  . Losartan Potassium-Hctz Other (See Comments)     dizziness, fatigue  . Prednisone Other (See Comments)    Raises blood sugar  . Doxycycline Rash  . Lisinopril Rash    Patient Measurements: Height: 6\' 3"  (190.5 cm) Weight: 292 lb 1.8 oz (132.5 kg) IBW/kg (Calculated) : 84.5  Heparin Dosing Weight: 113kg  Vital Signs: Temp: 98.4 F (36.9 C) (10/02 1932) Temp src: Oral (10/02 1932) BP: 119/62 mmHg (10/02 2200) Pulse Rate: 60  (10/02 2230)  Labs:  Basename 09/04/12 2208 09/04/12 0725  HGB -- --  HCT -- --  PLT -- --  APTT -- 29  LABPROT -- 12.6  INR -- 0.95  HEPARINUNFRC 0.18* --  CREATININE -- --  CKTOTAL -- --  CKMB -- --  TROPONINI -- --    Estimated Creatinine Clearance: 144 ml/min (by C-G formula based on Cr of 0.83).   Medications:  Scheduled:    . aspirin EC      . aspirin  325 mg Oral Q breakfast  .  ceFAZolin (ANCEF) IV  3 g Intravenous Once  . clopidogrel  75 mg Oral Q breakfast  . influenza  inactive virus vaccine  0.5 mL Intramuscular Tomorrow-1000  . metoprolol succinate  100 mg Oral q morning - 10a  . niMODipine        Assessment: 56 y/o male patient s/p cerebral aneurysm coiling requiring anticoagulation until morning. Initial heparin level is at goal (0.18) at 1100 units/hr.  Goal of Therapy:  Heparin level 0.1-0.25 units/ml  Monitor platelets by anticoagulation protocol: Yes    Plan:  -No heparin changes needed -Heparin to be discontinued at 7am on 09/05/12, no further heparin levels  Harland German, Pharm D 09/04/2012 10:37 PM

## 2012-09-04 NOTE — H&P (Signed)
Chief Complaint: Rt MCA aneurysm HPI: Paul Paul is an 56 y.o. male who was seen for worsening headaches over 1 yr ago  Abn MRI showed cerebral aneurysm  Arteriogram 11/22/11 confirmed small R MCA aneurysm  Re check arteriogram 05/28/12 revealed aneurysm to be enlarging  Scheduled now for R MCA aneurysm coil/stent placement  HPI: HTN; DM; pacemaker; sleep apnea; quit smoking 1 yr ago   Past Medical History:  Past Medical History  Diagnosis Date  . HYPERTENSION 12/10/2007  . Diabetes mellitus     now under control  . Pacemaker   . Bradycardia   . Dysrhythmia     BRADYCARDIA REQUIRING PACEMAKER .FOLLOWED BY GREGG TAYLOR  . Sleep apnea     CPAP.NOT WEARING NOW.NEEDS REPLACEMENT PIECE  . Arthritis   . Shoulder pain     L SHOULDER. RECENT FALL  . Shortness of breath     WITH EXERTION  . Anxiety   . Bell palsy     > 30 years  . Constipation   . Cerebral aneurysm     Past Surgical History:  Past Surgical History  Procedure Date  . Septal deviation   . Knee surgery     left; arthoscopic  . Insert / replace / remove pacemaker   . Rotator cuff surgery 02/2012  . Cardiac catheterization 07/06/11    normal  . Fracture surgery     Family History:  Family History  Problem Relation Age of Onset  . Heart attack Mother   . Heart disease Mother   . Diabetes Mother   . Heart attack Father   . Heart disease Father     Social History:  reports that he quit smoking about 14 months ago. His smoking use included Cigarettes. He has a 20 pack-year smoking history. He does not have any smokeless tobacco history on file. He reports that he does not drink alcohol or use illicit drugs.  Allergies:  Allergies  Allergen Reactions  . Losartan Potassium-Hctz Other (See Comments)     dizziness, fatigue  . Prednisone Other (See Comments)    Raises blood sugar  . Doxycycline Rash  . Lisinopril Rash    Medications: (Not in a hospital admission)  Please HPI for pertinent positives,  otherwise complete 10 system ROS negative.  Physical Exam: Blood pressure 132/79, pulse 60, temperature 97.9 F (36.6 C), temperature source Oral, resp. rate 20, height 6\' 3"  (1.905 m), weight 290 lb (131.543 kg), SpO2 96.00%. Body mass index is 36.25 kg/(m^2).   General Appearance:  Alert, cooperative, no distress, appears stated age  Head:  Normocephalic, without obvious abnormality, atraumatic  ENT: Unremarkable  Neck: Supple, symmetrical, trachea midline, no adenopathy, thyroid: not enlarged, symmetric, no tenderness/mass/nodules  Lungs:   Clear to auscultation bilaterally, no w/r/r, respirations unlabored without use of accessory muscles.  Heart:  Regular rate and rhythm, S1, S2 normal, no murmur, rub or gallop. Carotids 2+ without bruit.  Abdomen:   Soft, non-tender, non distended. Bowel sounds active all four quadrants,  no masses, no organomegaly.  Extremities: Extremities normal, atraumatic, no cyanosis or edema  Pulses: 2+ and symmetric, femoral and pedal  Neurologic: Pt has already received some sedation this am, but pre-op neuro exam grossly normal   Results for orders placed during the hospital encounter of 09/04/12 (from the past 48 hour(s))  GLUCOSE, CAPILLARY     Status: Abnormal   Collection Time   09/04/12  7:18 AM      Component Value Range Comment  Glucose-Capillary 131 (*) 70 - 99 mg/dL    Labs: CBC    Component Value Date/Time   WBC 5.8 08/22/2012 0833   RBC 5.26 08/22/2012 0833   HGB 16.4 08/22/2012 0833   HCT 47.1 08/22/2012 0833   PLT 227 08/22/2012 0833   MCV 89.5 08/22/2012 0833   MCH 31.2 08/22/2012 0833   MCHC 34.8 08/22/2012 0833   RDW 13.0 08/22/2012 0833   LYMPHSABS 2.8 09/04/2012 0725   MONOABS 0.5 09/04/2012 0725   EOSABS 0.4 09/04/2012 0725   BASOSABS 0.1 09/04/2012 0725    BMET    Component Value Date/Time   NA 137 08/22/2012 0833   K 4.0 08/22/2012 0833   CL 105 08/22/2012 0833   CO2 23 08/22/2012 0833   GLUCOSE 139* 08/22/2012 0833   GLUCOSE 342  11/05/2008   BUN 11 08/22/2012 0833   CREATININE 0.83 08/22/2012 0833   CALCIUM 9.8 08/22/2012 0833   GFRNONAA >90 08/22/2012 0833   GFRAA >90 08/22/2012 0833   PT/INR: 12.6/0.95  PTT:29  Assessment/Plan Hx headaches; abn MRI - then cerebral arteriogram 12/12 showed R MCA aneurysm  Re check arteriogram 6/25 revealed aneurysm to be enlarging  Now scheduled for cerebral arteriogram with possible R MCA aneurysm coil/stent placement  Pt and wife have good understanding of procedure benefits and risks and agreeable to proceed.  Understands if we proceed he will be admitted to [address] [facility] and spend night. Labs ok. Consent signed in chart  Brayton El PA-C 09/04/2012, 8:32 AM

## 2012-09-04 NOTE — Progress Notes (Signed)
Day of Surgery  Subjective: Mild posterior headache with some post anesthesia shaking. Denies nausea or vomiting.   Objective: Vital signs in last 24 hours: Temp:  [97 F (36.1 C)-98.9 F (37.2 C)] 98.9 F (37.2 C) (10/02 1546) Pulse Rate:  [60-84] 74  (10/02 1500) Resp:  [13-20] 16  (10/02 1500) BP: (111-132)/(53-79) 111/53 mmHg (10/02 1500) SpO2:  [96 %-100 %] 96 % (10/02 1500) Arterial Line BP: (129-159)/(52-58) 139/52 mmHg (10/02 1500) Weight:  [289 lb 14.5 oz (131.5 kg)-292 lb 1.8 oz (132.5 kg)] 292 lb 1.8 oz (132.5 kg) (10/02 1245)     Intake/Output this shift: Total I/O In: 2058.9 [I.V.:2058.9] Out: 1025 [Urine:975; Blood:50]  PE:  Alert and oriented. PERRL, EOMI. Tongue midline with clear speech. Finger nose coordination intact. 5/5 strength to UE and LE bilaterally.  Right groin with no hematoma, minimally tender at this site with 2+ femoral pulse.  Rt DP pulse 2+ with warm RLE.    Studies/Results: Cerebral aneurysm coiling under general anesthesia.   Anti-infectives: Anti-infectives     Start     Dose/Rate Route Frequency Ordered Stop   09/04/12 0830   ceFAZolin (ANCEF) 3 g in dextrose 5 % 50 mL IVPB        3 g 160 mL/hr over 30 Minutes Intravenous  Once 09/03/12 1446 09/04/12 0913          Assessment/Plan: Enlarging cerebral aneurysm with headaches s/p endovascular coiling. Patient extubated and currently remains neurologically and hemodynamically stable.  Heparin to run overnight with nicardipine for BP control.  Will recheck am labs.  If stable and patient tolerating regular diet - plan for discharge with 2 week follow up.      LOS: 0 days    CAMPBELL,PAMELA D 09/04/2012

## 2012-09-04 NOTE — Progress Notes (Signed)
ANTICOAGULATION CONSULT NOTE - Initial Consult  Pharmacy Consult for UFH Indication: s/p cerebral aneurysm coiling  Allergies  Allergen Reactions  . Losartan Potassium-Hctz Other (See Comments)     dizziness, fatigue  . Prednisone Other (See Comments)    Raises blood sugar  . Doxycycline Rash  . Lisinopril Rash    Patient Measurements: Height: 6' 3.2" (191 cm) Weight: 289 lb 14.5 oz (131.5 kg) IBW/kg (Calculated) : 84.95  Heparin Dosing Weight: 113kg  Vital Signs: Temp: 97.9 F (36.6 C) (10/02 1230) Temp src: Oral (10/02 0639) BP: 121/60 mmHg (10/02 1220) Pulse Rate: 74  (10/02 1220)  Labs:  Basename 09/04/12 0725  HGB --  HCT --  PLT --  APTT 29  LABPROT 12.6  INR 0.95  HEPARINUNFRC --  CREATININE --  CKTOTAL --  CKMB --  TROPONINI --    Estimated Creatinine Clearance: 143.9 ml/min (by C-G formula based on Cr of 0.83).   Medical History: Past Medical History  Diagnosis Date  . HYPERTENSION 12/10/2007  . Diabetes mellitus     now under control  . Pacemaker   . Bradycardia   . Dysrhythmia     BRADYCARDIA REQUIRING PACEMAKER .FOLLOWED BY GREGG TAYLOR  . Sleep apnea     CPAP.NOT WEARING NOW.NEEDS REPLACEMENT PIECE  . Arthritis   . Shoulder pain     L SHOULDER. RECENT FALL  . Shortness of breath     WITH EXERTION  . Anxiety   . Bell palsy     > 30 years  . Constipation   . Cerebral aneurysm     Medications:  Prescriptions prior to admission  Medication Sig Dispense Refill  . aspirin EC 325 MG tablet Take 325 mg by mouth daily.      . clopidogrel (PLAVIX) 75 MG tablet Take 75 mg by mouth daily.      . metFORMIN (GLUCOPHAGE) 500 MG tablet Take 1 tablet (500 mg total) by mouth 2 (two) times daily with a meal.  180 tablet  1  . metoprolol succinate (TOPROL-XL) 100 MG 24 hr tablet Take 100 mg by mouth every morning.      . sennosides-docusate sodium (SENOKOT-S) 8.6-50 MG tablet Take 2 tablets by mouth daily as needed. For constipation         Assessment: 56 y/o male patient s/p cerebral aneurysm coiling requiring anticoagulation until morning.  Goal of Therapy:  Heparin level 0.1-0.25 units/ml Monitor platelets by anticoagulation protocol: Yes   Plan:  Heparin gtt initiated at 500 units/hr, will increase to 1100 units/hr based upon adjusted body weight. Will check heparin level in 6 hours. Plan for ufh to be d/c'd in am.  Verlene Mayer, PharmD, BCPS Pager [phone] 09/04/2012,12:49 PM

## 2012-09-04 NOTE — Progress Notes (Signed)
UR COMPLETED  

## 2012-09-04 NOTE — Progress Notes (Signed)
Patient placed on CPAP 7cmH2O and tolerating well. No O2 bleed in needed sats 95% hr 61 rr 16. Will continue to monitor.

## 2012-09-04 NOTE — Anesthesia Preprocedure Evaluation (Addendum)
Anesthesia Evaluation  Patient identified by MRN, date of birth, ID band Patient awake    Reviewed: Allergy & Precautions, H&P , NPO status , Patient's Chart, lab work & pertinent test results  Airway Mallampati: II  Neck ROM: full    Dental   Pulmonary shortness of breath, sleep apnea ,          Cardiovascular hypertension, + dysrhythmias + pacemaker     Neuro/Psych Anxiety Bell's palsy    GI/Hepatic   Endo/Other  diabetes, Type 2obese  Renal/GU      Musculoskeletal   Abdominal   Peds  Hematology   Anesthesia Other Findings   Reproductive/Obstetrics                          Anesthesia Physical Anesthesia Plan  ASA: III  Anesthesia Plan: General   Post-op Pain Management:    Induction: Intravenous  Airway Management Planned: Oral ETT  Additional Equipment: Arterial line  Intra-op Plan:   Post-operative Plan: Extubation in OR  Informed Consent: I have reviewed the patients History and Physical, chart, labs and discussed the procedure including the risks, benefits and alternatives for the proposed anesthesia with the patient or authorized representative who has indicated his/her understanding and acceptance.     Plan Discussed with: CRNA and Surgeon  Anesthesia Plan Comments:         Anesthesia Quick Evaluation

## 2012-09-05 LAB — CBC WITH DIFFERENTIAL/PLATELET
Basophils Absolute: 0 10*3/uL (ref 0.0–0.1)
Basophils Relative: 0 % (ref 0–1)
Hemoglobin: 14.4 g/dL (ref 13.0–17.0)
Lymphocytes Relative: 41 % (ref 12–46)
MCHC: 34 g/dL (ref 30.0–36.0)
Neutro Abs: 3.3 10*3/uL (ref 1.7–7.7)
Neutrophils Relative %: 48 % (ref 43–77)
RDW: 13.3 % (ref 11.5–15.5)
WBC: 7 10*3/uL (ref 4.0–10.5)

## 2012-09-05 LAB — BASIC METABOLIC PANEL
CO2: 23 mEq/L (ref 19–32)
Chloride: 105 mEq/L (ref 96–112)
GFR calc Af Amer: >90 mL/min (ref >90)
Potassium: 3.5 mEq/L (ref 3.5–5.1)

## 2012-09-05 LAB — HEPARIN LEVEL (UNFRACTIONATED): Heparin Unfractionated: <0.1 IU/mL — ABNORMAL LOW (ref 0.30–0.70)

## 2012-09-05 NOTE — Progress Notes (Signed)
1 Day Post-Op  Subjective: Rt Middle cerebral artery aneurysm stented 07/30/12 Coiling performed 09/04/12 Pt has done well overnight Slight posterior headache - responds to Tylenol  Objective: Vital signs in last 24 hours: Temp:  [97 F (36.1 C)-99 F (37.2 C)] 99 F (37.2 C) (10/03 0400) Pulse Rate:  [59-84] 64  (10/03 0700) Resp:  [13-22] 18  (10/03 0700) BP: (103-135)/(49-70) 122/57 mmHg (10/03 0700) SpO2:  [94 %-100 %] 94 % (10/03 0700) Arterial Line BP: (122-159)/(52-62) 123/56 mmHg (10/03 0700) Weight:  [289 lb 14.5 oz (131.5 kg)-292 lb 1.8 oz (132.5 kg)] 292 lb 1.8 oz (132.5 kg) (10/02 1245)    Intake/Output from previous day: 10/02 0701 - 10/03 0700 In: 4295.6 [P.O.:840; I.V.:3335.6] Out: 3850 [Urine:3800; Blood:50] Intake/Output this shift:    PE:  Afeb; VSS Rested well Pleasant; appropriate EOMI; A/O Smile =; face symmetrical Moves all 4s; good strength Rt groin NT; no bleeding; no hematoma 2+ pulses Glucose 133 UOP:  over 3 liters; yellow No BM; passing gas  Lab Results:   Basename 09/05/12 0430  WBC 7.0  HGB 14.4  HCT 42.3  PLT 243   BMET  Basename 09/05/12 0430  NA 137  K 3.5  CL 105  CO2 23  GLUCOSE 133*  BUN 8  CREATININE 0.73  CALCIUM 8.9   PT/INR  Basename 09/04/12 0725  LABPROT 12.6  INR 0.95   ABG No results found for this basename: PHART:2,PCO2:2,PO2:2,HCO3:2 in the last 72 hours  Studies/Results: No results found.  Anti-infectives: Anti-infectives     Start     Dose/Rate Route Frequency Ordered Stop   09/04/12 0830   ceFAZolin (ANCEF) 3 g in dextrose 5 % 50 mL IVPB        3 g 160 mL/hr over 30 Minutes Intravenous  Once 09/03/12 1446 09/04/12 0913          Assessment/Plan: s/p Procedure(s) (LRB) with comments: RADIOLOGY WITH ANESTHESIA (N/A) - coiling possible stenting    LOS: 1 day   Rt Middle cerebral artery aneurysm Staged embolization Stented 07/30/12 coiled 09/04/12 Pt has done well overnight Dc art  line; saline lock IV Glucose 133 - uses metformin at home Must wait 48 hrs post dye study Will discuss with Dr Corliss Skains Plan dc this am  Otniel Hoe A 09/05/2012

## 2012-09-05 NOTE — Progress Notes (Signed)
1 Day Post-Op  Subjective: Post procedure day 1. Fair night.Occasional occipital H/As.Denies any other neuro symptoms.. Eating and drinking without difficulty. Labs . Glucose 133. Objective: Vital signs in last 24 hours: Temp:  [97 F (36.1 C)-99 F (37.2 C)] 99 F (37.2 C) (10/03 0400) Pulse Rate:  [59-84] 64  (10/03 0700) Resp:  [13-22] 18  (10/03 0700) BP: (103-135)/(49-70) 122/57 mmHg (10/03 0700) SpO2:  [94 %-100 %] 94 % (10/03 0700) Arterial Line BP: (122-159)/(52-62) 123/56 mmHg (10/03 0700) Weight:  [289 lb 14.5 oz (131.5 kg)-292 lb 1.8 oz (132.5 kg)] 292 lb 1.8 oz (132.5 kg) (10/02 1245)    Intake/Output from previous day: 10/02 0701 - 10/03 0700 In: 4295.6 [P.O.:840; I.V.:3335.6] Out: 3850 [Urine:3800; Blood:50] Intake/Output this shift: Total I/O In: -  Out: 100 [Urine:100]  On exam.AVSS Sitting in a chair.. In no acute distress.  Neuro exam  AAOX3 . Non focal. Groin site WNLs.   Lab Results:   Basename 09/05/12 0430  WBC 7.0  HGB 14.4  HCT 42.3  PLT 243   BMET  Basename 09/05/12 0430  NA 137  K 3.5  CL 105  CO2 23  GLUCOSE 133*  BUN 8  CREATININE 0.73  CALCIUM 8.9   PT/INR  Basename 09/04/12 0725  LABPROT 12.6  INR 0.95   ABG No results found for this basename: PHART:2,PCO2:2,PO2:2,HCO3:2 in the last 72 hours  Studies/Results: No results found.  Anti-infectives: Anti-infectives     Start     Dose/Rate Route Frequency Ordered Stop   09/04/12 0830   ceFAZolin (ANCEF) 3 g in dextrose 5 % 50 mL IVPB        3 g 160 mL/hr over 30 Minutes Intravenous  Once 09/03/12 1446 09/04/12 0913          Assessment/Plan: s/p  S/P Coiling of RT MCA aneurysm. Plan. Ambulate with assistance. Advance diet. If tolerated D/C to home with wife. Continue aspirin  For 4 weeks,and plavix 75mg  for 2 weeks. RTC in 2 weeks.  Zackory Pudlo K 09/05/2012

## 2012-09-05 NOTE — Discharge Summary (Signed)
Physician Discharge Summary  Patient ID: Paul Paul MRN: [id number] DOB/AGE: [birth year] 56 y.o.  Admit date: 09/04/2012 Discharge date: 09/05/2012  Admission Diagnoses: Right middle cerebral artery aneurysm  Discharge Diagnoses: R MCA embolization  Active Problems:  Diabetes; Hypertension; pacemaker; Cerebral aneurysm  Discharged Condition: improved  Hospital Course: 56 yo male with Rt Middle cerebral artery aneurysm. Staged embolization with Dr Corliss Skains. Stent placed 07/30/12 and coiling 09/04/12.  Pt tolerated procedures well. Overnight stay was without complication. Eating and drinking well. Urinating well; passing gas. Slight headache that responds to Tylenol as needed. Neuro intact. Dr Corliss Skains has also seen and examined pt and ready for dc today. Pt will resume Metformin in am Continue Plavix daily Return for 2 week follow up on 09/18/12 at 2pm Pt and wife have good understanding of this plan  Consults: none  Significant Diagnostic Studies: cerebral arteriogram with embolization  Treatments: Coiling of previously stented R MCA aneurysm  Discharge Exam: Blood pressure 114/52, pulse 60, temperature 99 F (37.2 C), temperature source Oral, resp. rate 20, height 6\' 3"  (1.905 m), weight 292 lb 1.8 oz (132.5 kg), SpO2 96.00%.  PE:  A/O; EOMI Neuro intact Appropriate Face symmetrical Tongue midline Heart: RRR Lungs: CTA Abd: soft; +BS; NT Ext: FROM; good strength and movement Rt groin: NT; no bleeding; no hematoma Rt foot: 2 + pulses  Results for orders placed during the hospital encounter of 09/04/12  GLUCOSE, CAPILLARY      Component Value Range   Glucose-Capillary 131 (*) 70 - 99 mg/dL  GLUCOSE, CAPILLARY      Component Value Range   Glucose-Capillary 117 (*) 70 - 99 mg/dL  HEPARIN LEVEL (UNFRACTIONATED)      Component Value Range   Heparin Unfractionated 0.18 (*) 0.30 - 0.70 IU/mL  CBC WITH DIFFERENTIAL      Component Value Range   WBC 7.0  4.0 - 10.5 K/uL    RBC 4.74  4.22 - 5.81 MIL/uL   Hemoglobin 14.4  13.0 - 17.0 g/dL   HCT 44.0  34.7 - 42.5 %   MCV 89.2  78.0 - 100.0 fL   MCH 30.4  26.0 - 34.0 pg   MCHC 34.0  30.0 - 36.0 g/dL   RDW 95.6  38.7 - 56.4 %   Platelets 243  150 - 400 K/uL   Neutrophils Relative 48  43 - 77 %   Neutro Abs 3.3  1.7 - 7.7 K/uL   Lymphocytes Relative 41  12 - 46 %   Lymphs Abs 2.8  0.7 - 4.0 K/uL   Monocytes Relative 8  3 - 12 %   Monocytes Absolute 0.5  0.1 - 1.0 K/uL   Eosinophils Relative 4  0 - 5 %   Eosinophils Absolute 0.3  0.0 - 0.7 K/uL   Basophils Relative 0  0 - 1 %   Basophils Absolute 0.0  0.0 - 0.1 K/uL  BASIC METABOLIC PANEL      Component Value Range   Sodium 137  135 - 145 mEq/L   Potassium 3.5  3.5 - 5.1 mEq/L   Chloride 105  96 - 112 mEq/L   CO2 23  19 - 32 mEq/L   Glucose, Bld 133 (*) 70 - 99 mg/dL   BUN 8  6 - 23 mg/dL   Creatinine, Ser 3.32  0.50 - 1.35 mg/dL   Calcium 8.9  8.4 - 95.1 mg/dL   GFR calc non Af Amer >90  >90 mL/min   GFR calc  Af Amer >90  >90 mL/min  HEPARIN LEVEL (UNFRACTIONATED)      Component Value Range   Heparin Unfractionated <0.10 (*) 0.30 - 0.70 IU/mL    Disposition: Right middle cerebral artery aneurysm staged embolization Stent placed 8/27/123 Coiling 09/04/12 Pt has done well and is ready for dc. Dr Corliss Skains has seen and examined pt Resume Metformin in am Continue all meds Continue Plavix daily: Rx given for #30 Follow up appt is 09/18/12 Pt and wife have good understanding of dc instructions and plan  Discharge Orders    Future Appointments: Provider: Department: Dept Phone: Center:   10/14/2012 8:25 AM [facility] [phone] [id number]     Future Orders Please Complete By Expires   Diet - low sodium heart healthy      Increase activity slowly      Scheduling Instructions:   May shower tomorrow   Comments:   May shower tomorrow   Discharge instructions      Comments:   Restart Metformin  tomorrow; restful; 2 week f/u 10/16 at 2pm [facility] radiology; call Victorino Dike at [phone] if questions   Driving Restrictions      Comments:   No driving x 2 weeks   Lifting restrictions      Comments:   No lifting over 10 lbs x 2 weeks   Remove dressing in 24 hours      Call MD for:  temperature >100.4      Call MD for:  persistant nausea and vomiting      Call MD for:  severe uncontrolled pain      Call MD for:  redness, tenderness, or signs of infection (pain, swelling, redness, odor or green/yellow discharge around incision site)      Call MD for:  persistant dizziness or light-headedness          Medication List     As of 09/05/2012 10:34 AM    TAKE these medications         aspirin EC 325 MG tablet   Take 325 mg by mouth daily.      clopidogrel 75 MG tablet   Commonly known as: PLAVIX   Take 75 mg by mouth daily.      metFORMIN 500 MG tablet   Commonly known as: GLUCOPHAGE   Take 1 tablet (500 mg total) by mouth 2 (two) times daily with a meal.      metoprolol succinate 100 MG 24 hr tablet   Commonly known as: TOPROL-XL   Take 100 mg by mouth every morning.      sennosides-docusate sodium 8.6-50 MG tablet   Commonly known as: SENOKOT-S   Take 2 tablets by mouth daily as needed. For constipation         Signed: Debanhi Blaker A 09/05/2012, 10:34 AM

## 2012-09-06 ENCOUNTER — Other Ambulatory Visit (HOSPITAL_COMMUNITY): Payer: Self-pay | Admitting: Interventional Radiology

## 2012-09-06 DIAGNOSIS — Z09 Encounter for follow-up examination after completed treatment for conditions other than malignant neoplasm: Secondary | ICD-10-CM

## 2012-09-06 DIAGNOSIS — I729 Aneurysm of unspecified site: Secondary | ICD-10-CM

## 2012-09-18 ENCOUNTER — Ambulatory Visit (HOSPITAL_COMMUNITY)
Admission: RE | Admit: 2012-09-18 | Discharge: 2012-09-18 | Disposition: A | Discharge status: Home / Self Care | Payer: Managed Care, Other (non HMO) | Source: Ambulatory Visit | Attending: Interventional Radiology | Admitting: Interventional Radiology

## 2012-09-18 ENCOUNTER — Other Ambulatory Visit (HOSPITAL_COMMUNITY): Payer: Self-pay | Admitting: Interventional Radiology

## 2012-09-18 DIAGNOSIS — I729 Aneurysm of unspecified site: Secondary | ICD-10-CM | POA: Insufficient documentation

## 2012-09-18 DIAGNOSIS — Z09 Encounter for follow-up examination after completed treatment for conditions other than malignant neoplasm: Secondary | ICD-10-CM

## 2012-09-18 LAB — PLATELET INHIBITION P2Y12: Platelet Function  P2Y12: 6 [PRU] — ABNORMAL LOW (ref 194–418)

## 2012-09-23 ENCOUNTER — Encounter (HOSPITAL_BASED_OUTPATIENT_CLINIC_OR_DEPARTMENT_OTHER): Payer: Managed Care, Other (non HMO)

## 2012-09-25 ENCOUNTER — Telehealth: Payer: Self-pay | Admitting: Internal Medicine

## 2012-09-25 NOTE — Telephone Encounter (Signed)
Pt called and said that he would like to get some labs ordered to check cholesterol, and blood sugar, etc. Pls order the necessary labs and advise. Pt would like this done before end of year and also wants an ov to see Dr Cato Mulligan.

## 2012-09-26 NOTE — Telephone Encounter (Signed)
Schedule for cpx labs and hgba1c and schedule a 6 mth f/u with next available slot

## 2012-09-26 NOTE — Telephone Encounter (Signed)
Called pt and schd ov on 11/12/12 and fasting cpx labs and hga1c on 11/01/12 as noted.

## 2012-10-03 IMAGING — XA IR ANGIO/CAROTID/CERV BI
10 of 13 series · 13 of 24 positions shown · IV contrast (IODINE)
Comparison: CT angiogram of 11/16/2011.

CLINICAL DATA: Patient with dizziness and left-sided intermittent
numbness. Abnormal MRI MRA of the brain suggestive of intracranial
aneurysm.

BILATERAL CAROTID ARTERIOGRAPHY AND BILATERAL VERTEBRAL ARTERY
ANGIOGRAMS

[Series 1: carotid · 1 of 10 frames shown (1 of 8)]
[frame 2/10]
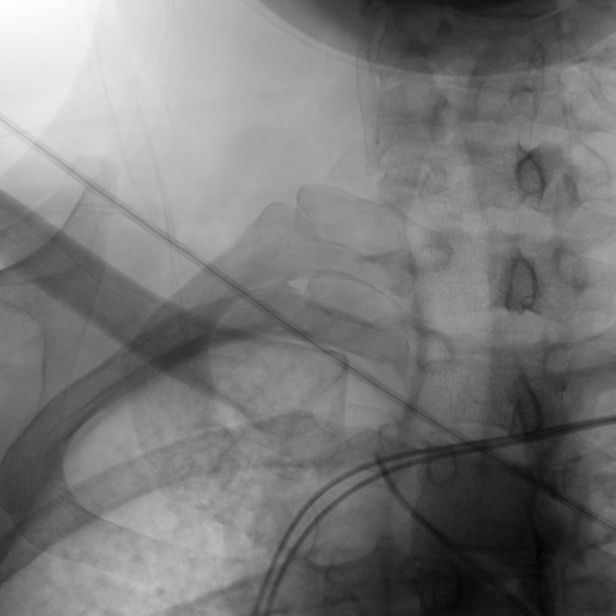

[Series 2: carotid · 2 acquisitions, 1 frame shown (2 of 8)]
[im 1/2]
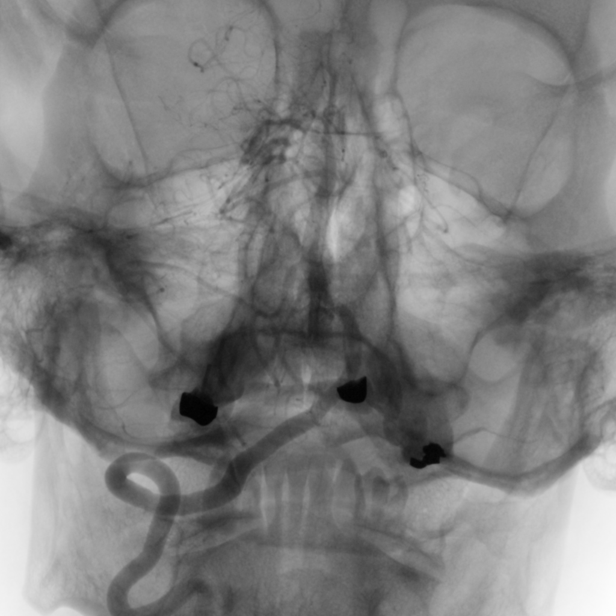

[Series 3: carotid · 2 acquisitions, 1 frame shown (3 of 8)]
[im 1/2]
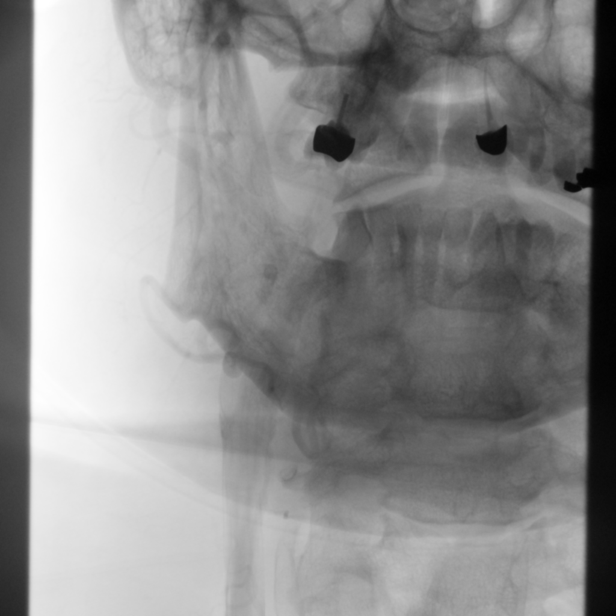

[Series 4: carotid · 2 acquisitions, 1 frame shown (4 of 8)]
[im 1/2]
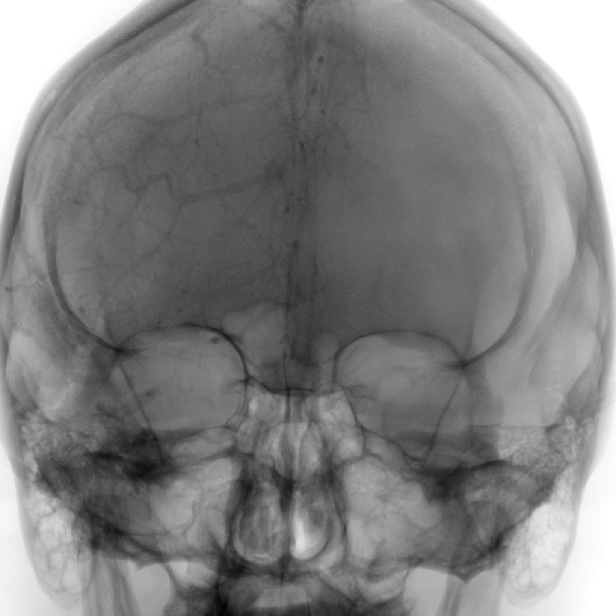

[Series 5: carotid · 2 acquisitions, 1 frame shown (5 of 8)]
[im 1/2]
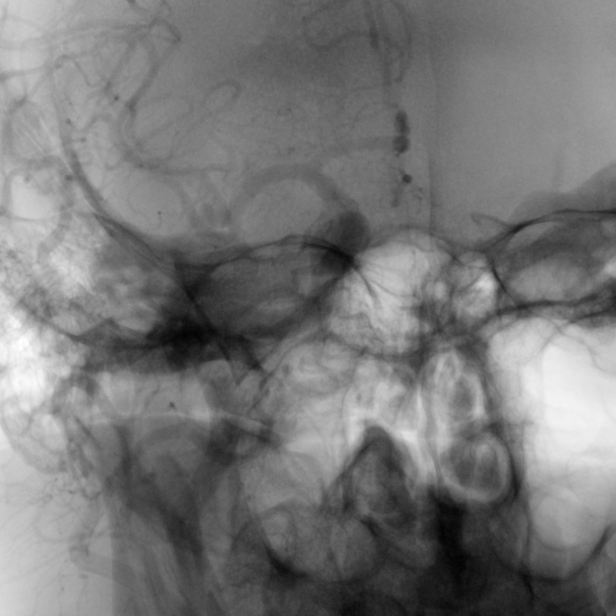

[Series 7: carotid · 1 of 2 frames shown (6 of 8)]
[frame 1/2]
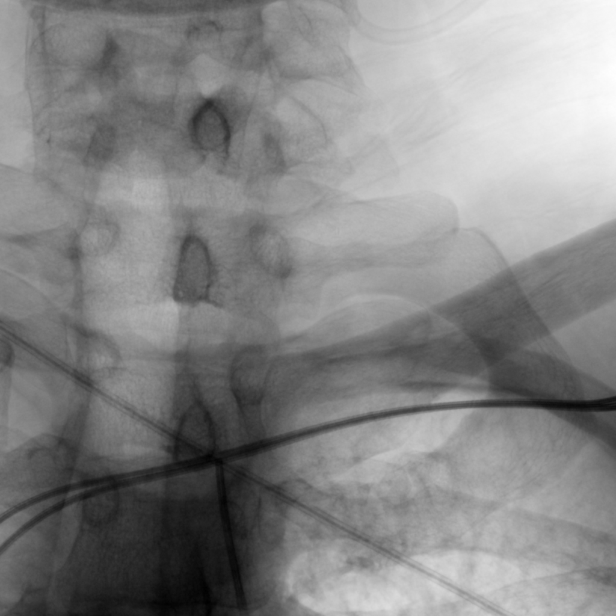

[Series 9: carotid · 2 acquisitions, 2 frames shown (7 of 8)]
[im 1/2]
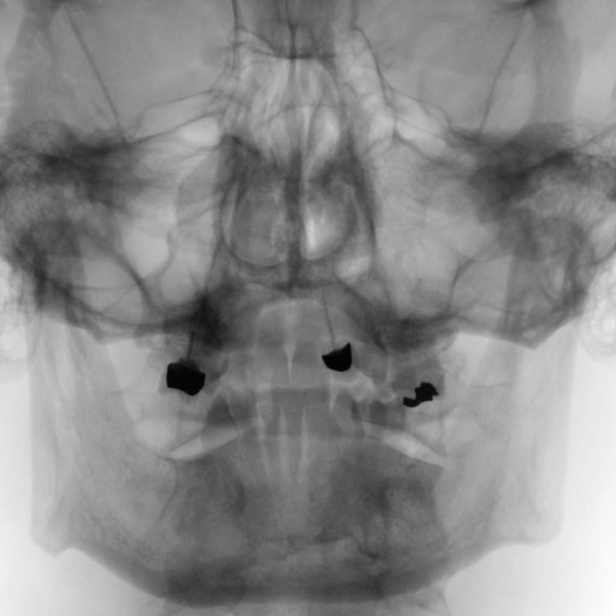
[im 1/2]
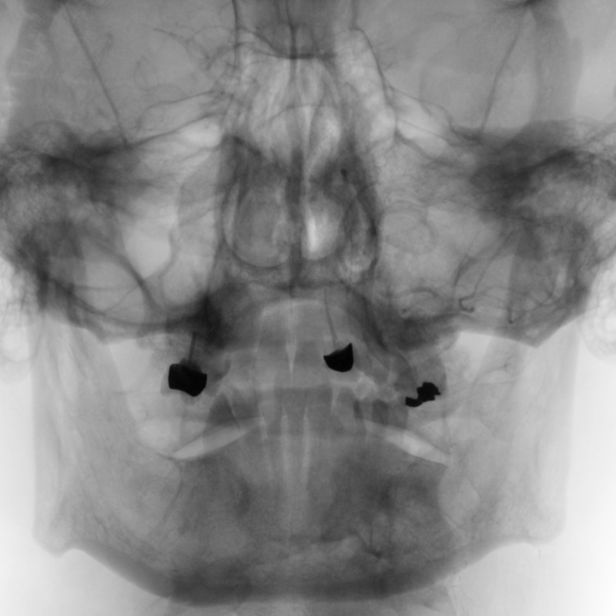

[Series 11: carotid · 2 acquisitions, 1 frame shown (8 of 8)]
[im 1/2]
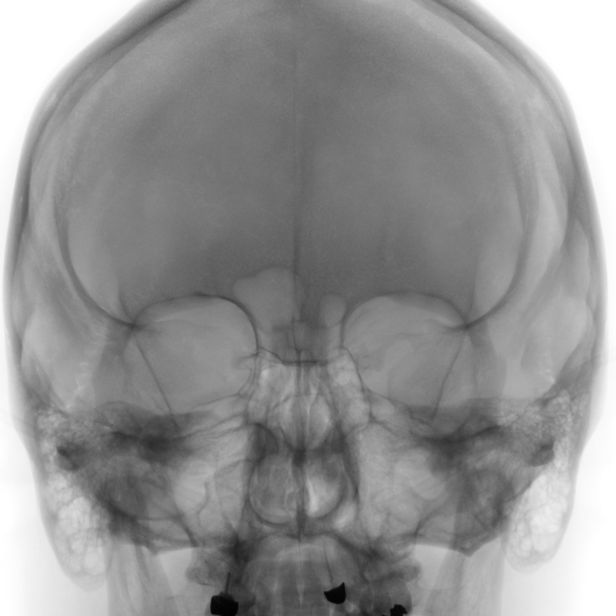

[Series 12: (id) · 2 acquisitions, 1 frame shown]
[im 1/2]
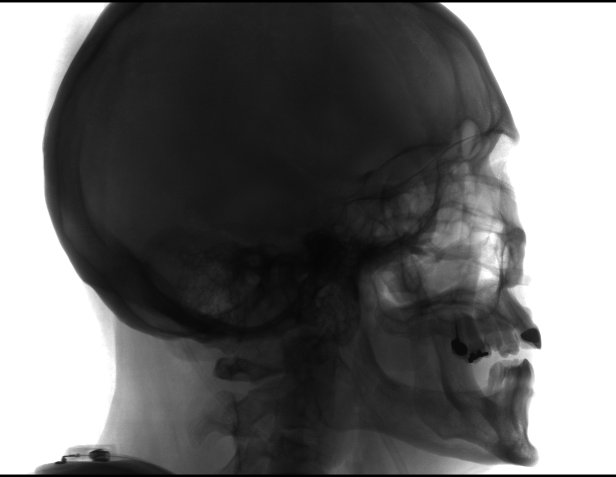

[Series 300: ir transcath/emboliz · 3 of 133 slices shown]
[im 13/133]
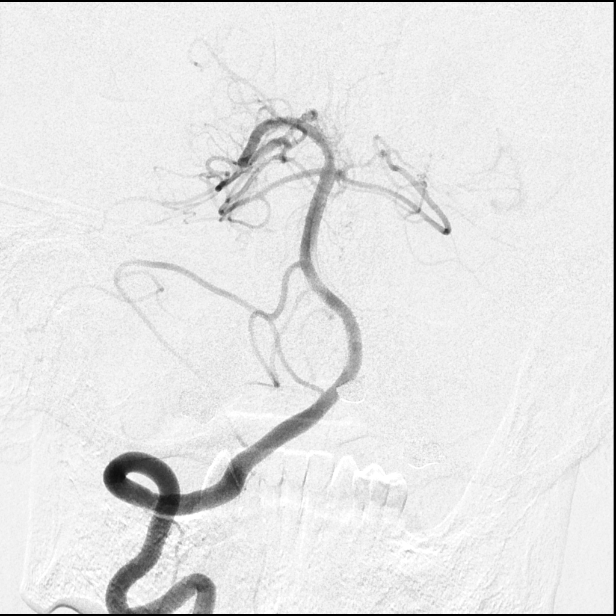
[im 73/133]
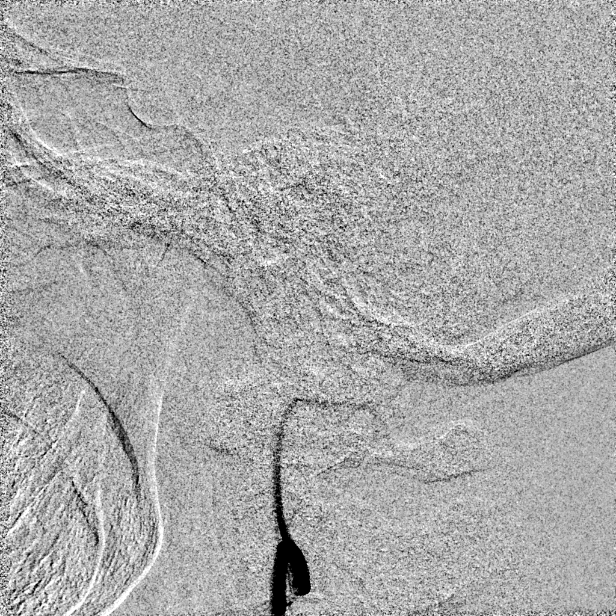
[im 133/133]
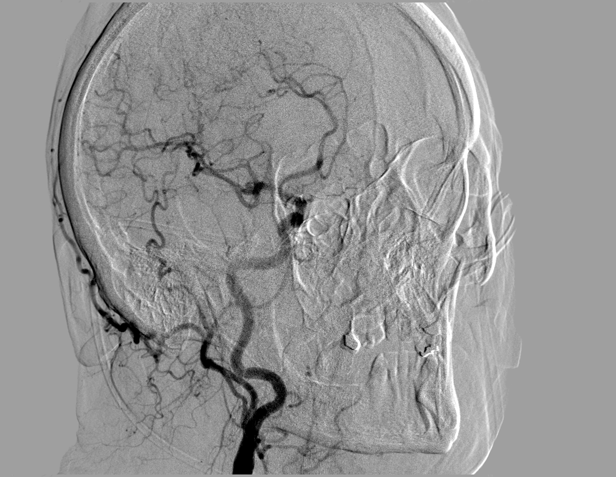

[13 of 24 positions shown; findings below may reference images not displayed]

Following a full explanation of the procedure along with the
potential associated complications, an informed witnessed consent
was obtained.

The right groin was prepped and draped in the usual sterile
fashion.  Thereafter, using a modified Seldinger technique,
transfemoral access into the right common femoral artery was
obtained without difficulty.  Over a 0.035-inch guidewire, 5-French
Pinnacle sheath was inserted.  Through this and also over a 0.035-
inch guidewire, a JB1 catheter was advanced to the aortic arch
region and selectively positioned in the right vertebral artery,
the right common carotid artery, the left common carotid artery and
the left vertebral artery.

There were no acute complications. The patient tolerated the
procedure well.

Medications utilized: Versed 1 mg IV.  Fentanyl 25 mcg IV.

Contrast: 9mnipaque-HGG approximately 65 ml.
FINDINGS: The right vertebral artery origin is normal.  The vessel
opacifies normally to the cranial skull base.  There is normal
opacification of the right posterior-inferior cerebellar artery and
the right vertebrobasilar junction.

The basilar artery, the right posterior cerebral artery, the
superior cerebellar arteries and the anterior-inferior cerebellar
arteries opacify normally into capillary and venous phases.

The right common carotid arteriogram demonstrated the right
external carotid artery and its major branches to be normal.

The right internal carotid artery at the bulb to the cranial skull
base opacifies normally.

The petrous, cavernous and the supraclinoid segments are normal.

The right middle and the right anterior cerebral arteries are seen
to opacify normally into capillary and venous phases.  Cross
opacification via the anterior communicating artery of the left
anterior cerebral artery distal to the A2 segment is seen.

Arising in the right MCA trifurcation region is a saccular vascular
abnormality.

A 3-D rotational arteriogram with subsequent reconstruction on a
separate workstation reveals the presence of two small aneurysms
arising in the trifurcation region.  One measures approximately
mm, and the other approximately 1.6 mm. The neck appears to be
approximately 1.5 mm.

The left vertebral artery origin is normal.  The vessel opacifies
normally to the cranial skull base.

There is normal opacification of the left posterior-inferior
cerebellar artery and the left vertebrobasilar junction.

The opacified portion of the basilar artery, the right posterior
cerebral artery, the superior cerebellar arteries and the anterior-
inferior cerebellar arteries is grossly normal into the delayed
arterial phase.

Unopacified blood is seen in the basilar artery from a
contralateral vertebral artery.

The left common carotid arteriogram demonstrates the left external
carotid artery and its major branches to be normal.

The left internal carotid artery at the bulb to the cranial skull
base opacifies normally.

The petrous, cavernous and supraclinoid segments are normal.

A dominant left posterior communicating artery is seen opacifying
the left posterior cerebral and superior cerebellar artery
distributions.

The left middle and the left anterior cerebral arteries opacify
normally into capillary and venous phases.
IMPRESSION: 1.  Two small aneurysms arising in the right MCA trifurcation
region, one measuring approximately 2.8 mm and one measuring
approximately 1.6 mm, both of which appear to have a common neck of
approximately 1.5 mm.

The angiographic findings were reviewed with the patient and the
patient's family.

Given the smallness of the aneurysms, and the stability compared to
the MRA examination of 6552, it was elected to pursue conservative
management with controlling of patient's high blood pressure, and
dietary modification.

A follow-up arteriogram will be undertaken in approximately 6 to 8
months from today or sooner should symptoms dictate.

## 2012-10-14 ENCOUNTER — Encounter: Payer: Self-pay | Admitting: Internal Medicine

## 2012-10-14 ENCOUNTER — Ambulatory Visit (INDEPENDENT_AMBULATORY_CARE_PROVIDER_SITE_OTHER): Payer: Managed Care, Other (non HMO) | Admitting: *Deleted

## 2012-10-14 DIAGNOSIS — Z8679 Personal history of other diseases of the circulatory system: Secondary | ICD-10-CM

## 2012-10-14 DIAGNOSIS — Z950 Presence of cardiac pacemaker: Secondary | ICD-10-CM

## 2012-10-14 DIAGNOSIS — I498 Other specified cardiac arrhythmias: Secondary | ICD-10-CM

## 2012-10-15 LAB — REMOTE PACEMAKER DEVICE
AL AMPLITUDE: 5 mv
AL IMPEDENCE PM: 480 Ohm
BAMS-0001: 170 {beats}/min
BAMS-0003: 70 {beats}/min
BRDY-0004RV: 125 {beats}/min
DEVICE MODEL PM: 7250770
RV LEAD AMPLITUDE: 6.8 mv
RV LEAD IMPEDENCE PM: 540 Ohm
RV LEAD THRESHOLD: 1.5 V

## 2012-10-16 ENCOUNTER — Telehealth: Payer: Self-pay | Admitting: Internal Medicine

## 2012-10-16 NOTE — Telephone Encounter (Signed)
Caller: [name]/Patient; Patient Name: Paul Paul; PCP: Birdie Sons (Adults only); Best Callback Phone Number: [phone] Patient requesting a medication to help him have regular and softer stools  Senna plus in past did not help. Duccolax caused stomach upset and abd.pain.  Patient's pharmacy is [facility] , [place], [phone].  Will forward med request to Dr. Cato Mulligan.

## 2012-10-16 NOTE — Telephone Encounter (Signed)
How about amitiza?

## 2012-10-16 NOTE — Telephone Encounter (Signed)
He has tried Senokot

## 2012-10-16 NOTE — Telephone Encounter (Signed)
Have him take senokot- s bid , hold for loose stools

## 2012-10-17 NOTE — Telephone Encounter (Signed)
Caller: Daiton/Patient; Patient Name: Paul Paul; PCP: Birdie Sons (Adults only); Best Callback Phone Number: [phone].  patient calling in follow up to call 10/16/12 regarding request for Rx for constipation.   Patient states he has tried senokot-S without results.  Per Epic, no further notes after suggestion to try senokot-S; declines new triage.  Info to office for staff/provider review/Rx/callback.  May reach patient [phone].

## 2012-10-18 NOTE — Telephone Encounter (Signed)
Left message for pt to call back  °

## 2012-10-18 NOTE — Telephone Encounter (Signed)
Pt aware.

## 2012-10-18 NOTE — Telephone Encounter (Signed)
Continue senokot -s  Can add glycolax 17 grams daily- OTC

## 2012-10-22 ENCOUNTER — Encounter: Payer: Self-pay | Admitting: *Deleted

## 2012-11-01 ENCOUNTER — Other Ambulatory Visit (INDEPENDENT_AMBULATORY_CARE_PROVIDER_SITE_OTHER): Payer: Managed Care, Other (non HMO)

## 2012-11-01 DIAGNOSIS — Z0000 Encounter for general adult medical examination without abnormal findings: Secondary | ICD-10-CM

## 2012-11-01 LAB — CBC WITH DIFFERENTIAL/PLATELET
Basophils Absolute: 0.1 10*3/uL (ref 0.0–0.1)
Basophils Relative: 1 % (ref 0.0–3.0)
Eosinophils Absolute: 0.3 10*3/uL (ref 0.0–0.7)
Lymphocytes Relative: 40.9 % (ref 12.0–46.0)
MCHC: 33.1 g/dL (ref 30.0–36.0)
Neutrophils Relative %: 44.3 % (ref 43.0–77.0)
RBC: 5.09 Mil/uL (ref 4.22–5.81)

## 2012-11-01 LAB — LIPID PANEL
Cholesterol: 165 mg/dL (ref 0–200)
HDL: 33.7 mg/dL — ABNORMAL LOW (ref >39.00)
LDL Cholesterol: 103 mg/dL — ABNORMAL HIGH (ref 0–99)
Total CHOL/HDL Ratio: 5
Triglycerides: 144 mg/dL (ref 0.0–149.0)
VLDL: 28.8 mg/dL (ref 0.0–40.0)

## 2012-11-01 LAB — POCT URINALYSIS DIPSTICK
Bilirubin, UA: NEGATIVE
Leukocytes, UA: NEGATIVE
Nitrite, UA: NEGATIVE
pH, UA: 5.5

## 2012-11-01 LAB — HEMOGLOBIN A1C: Hgb A1c MFr Bld: 7.4 % — ABNORMAL HIGH (ref 4.6–6.5)

## 2012-11-01 LAB — BASIC METABOLIC PANEL
CO2: 24 mEq/L (ref 19–32)
Calcium: 9.2 mg/dL (ref 8.4–10.5)
Creatinine, Ser: 0.8 mg/dL (ref 0.4–1.5)

## 2012-11-01 LAB — MICROALBUMIN / CREATININE URINE RATIO: Creatinine,U: 195 mg/dL

## 2012-11-01 LAB — HEPATIC FUNCTION PANEL
Alkaline Phosphatase: 42 U/L (ref 39–117)
Bilirubin, Direct: 0.2 mg/dL (ref 0.0–0.3)

## 2012-11-01 LAB — PSA: PSA: 0.4 ng/mL (ref 0.10–4.00)

## 2012-11-12 ENCOUNTER — Encounter: Payer: Self-pay | Admitting: Internal Medicine

## 2012-11-12 ENCOUNTER — Ambulatory Visit (INDEPENDENT_AMBULATORY_CARE_PROVIDER_SITE_OTHER): Payer: Managed Care, Other (non HMO) | Admitting: Internal Medicine

## 2012-11-12 VITALS — BP 122/82 | HR 76 | Temp 98.0°F | Wt 302.0 lb

## 2012-11-12 DIAGNOSIS — I10 Essential (primary) hypertension: Secondary | ICD-10-CM

## 2012-11-12 DIAGNOSIS — E119 Type 2 diabetes mellitus without complications: Secondary | ICD-10-CM

## 2012-11-12 DIAGNOSIS — I498 Other specified cardiac arrhythmias: Secondary | ICD-10-CM

## 2012-11-12 DIAGNOSIS — Z8679 Personal history of other diseases of the circulatory system: Secondary | ICD-10-CM

## 2012-11-12 DIAGNOSIS — Z950 Presence of cardiac pacemaker: Secondary | ICD-10-CM

## 2012-11-12 NOTE — Progress Notes (Signed)
Patient ID: Paul Paul, male   DOB: 1956/02/20, 56 y.o.   MRN: [id number]   patient comes in for followup of multiple medical problems including type 2 diabetes, hyperlipidemia, hypertension. The patient does not check blood sugar or blood pressure at home. The patetient does not follow an exercise or diet program. The patient denies any polyuria, polydipsia.  In the past the patient has gone to diabetic treatment center. The patient is tolerating medications  Without difficulty. The patient does admit to medication compliance.   Past Medical History  Diagnosis Date  . HYPERTENSION 12/10/2007  . Diabetes mellitus     now under control  . Pacemaker   . Bradycardia   . Dysrhythmia     BRADYCARDIA REQUIRING PACEMAKER .FOLLOWED BY GREGG TAYLOR  . Sleep apnea     CPAP.NOT WEARING NOW.NEEDS REPLACEMENT PIECE  . Arthritis   . Shoulder pain     L SHOULDER. RECENT FALL  . Shortness of breath     WITH EXERTION  . Anxiety   . Bell palsy     > 30 years  . Constipation   . Cerebral aneurysm     History   Social History  . Marital Status: Married    Spouse Name: N/A    Number of Children: N/A  . Years of Education: N/A   Occupational History  . Not on file.   Social History Main Topics  . Smoking status: Former Smoker -- 1.0 packs/day for 20 years    Types: Cigarettes    Quit date: 07/05/2011  . Smokeless tobacco: Not on file  . Alcohol Use: No  . Drug Use: No  . Sexually Active:    Other Topics Concern  . Not on file   Social History Narrative   Not smoking     Past Surgical History  Procedure Date  . Septal deviation   . Knee surgery     left; arthoscopic  . Insert / replace / remove pacemaker   . Rotator cuff surgery 02/2012  . Cardiac catheterization 07/06/11    normal  . Fracture surgery     Family History  Problem Relation Age of Onset  . Heart attack Mother   . Heart disease Mother   . Diabetes Mother   . Heart attack Father   . Heart disease Father      Allergies  Allergen Reactions  . Losartan Potassium-Hctz Other (See Comments)     dizziness, fatigue  . Prednisone Other (See Comments)    Raises blood sugar  . Doxycycline Rash  . Lisinopril Rash    Current Outpatient Prescriptions on File Prior to Visit  Medication Sig Dispense Refill  . aspirin EC 325 MG tablet Take 325 mg by mouth daily.      . metFORMIN (GLUCOPHAGE) 500 MG tablet Take 1 tablet (500 mg total) by mouth 2 (two) times daily with a meal.  180 tablet  1  . metoprolol succinate (TOPROL-XL) 100 MG 24 hr tablet Take 100 mg by mouth every morning.      . sennosides-docusate sodium (SENOKOT-S) 8.6-50 MG tablet Take 2 tablets by mouth daily as needed. For constipation         patient denies chest pain, shortness of breath, orthopnea. Denies lower extremity edema, abdominal pain, change in appetite, change in bowel movements. Patient denies rashes, musculoskeletal complaints. No other specific complaints in a complete review of systems.   BP 122/82  Pulse 76  Temp 98 F (36.7 C) (Oral)  Wt 302 lb (136.986 kg)  well-developed well-nourished male in no acute distress. HEENT exam atraumatic, normocephalic, neck supple without jugular venous distention. Chest clear to auscultation cardiac exam S1-S2 are regular. Abdominal exam overweight with bowel sounds, soft and nontender. Extremities no edema. Neurologic exam is alert with a normal gait.

## 2012-11-12 NOTE — Assessment & Plan Note (Signed)
Has regular f/u with [facility]

## 2012-11-12 NOTE — Assessment & Plan Note (Signed)
bp adequately controlled Continue meds

## 2012-11-12 NOTE — Assessment & Plan Note (Signed)
Fair control Continue meds  He must lose weight - he has multiple excuses for why he can't lose weight.

## 2012-11-12 NOTE — Assessment & Plan Note (Signed)
Pacemaker has been placed

## 2012-11-20 ENCOUNTER — Other Ambulatory Visit: Payer: Self-pay | Admitting: Radiology

## 2012-11-21 ENCOUNTER — Telehealth: Payer: Self-pay | Admitting: Internal Medicine

## 2012-11-21 ENCOUNTER — Other Ambulatory Visit: Payer: Self-pay | Admitting: Internal Medicine

## 2012-11-21 ENCOUNTER — Encounter (HOSPITAL_COMMUNITY): Payer: Self-pay | Admitting: Pharmacy Technician

## 2012-11-21 DIAGNOSIS — I729 Aneurysm of unspecified site: Secondary | ICD-10-CM

## 2012-11-21 NOTE — Telephone Encounter (Signed)
Spoke with pt and nicole- pt wants to cancel if cant get before end of year---no insurance then

## 2012-11-21 NOTE — Telephone Encounter (Signed)
Patient called stating that if he is going to be referred to a Neurologist he need an appt before the end of the year as he will not have insurance. Please advise or assist.

## 2012-11-24 ENCOUNTER — Other Ambulatory Visit: Payer: Self-pay | Admitting: Physician Assistant

## 2012-11-26 ENCOUNTER — Other Ambulatory Visit: Payer: Self-pay | Admitting: Physician Assistant

## 2012-11-29 ENCOUNTER — Encounter (HOSPITAL_COMMUNITY): Payer: Self-pay

## 2012-11-29 ENCOUNTER — Ambulatory Visit (HOSPITAL_COMMUNITY)
Admission: RE | Admit: 2012-11-29 | Discharge: 2012-11-29 | Disposition: A | Discharge status: Home / Self Care | Payer: Managed Care, Other (non HMO) | Source: Ambulatory Visit | Attending: Interventional Radiology | Admitting: Interventional Radiology

## 2012-11-29 DIAGNOSIS — E119 Type 2 diabetes mellitus without complications: Secondary | ICD-10-CM | POA: Insufficient documentation

## 2012-11-29 DIAGNOSIS — Z09 Encounter for follow-up examination after completed treatment for conditions other than malignant neoplasm: Secondary | ICD-10-CM | POA: Insufficient documentation

## 2012-11-29 DIAGNOSIS — I729 Aneurysm of unspecified site: Secondary | ICD-10-CM

## 2012-11-29 DIAGNOSIS — Z9889 Other specified postprocedural states: Secondary | ICD-10-CM | POA: Insufficient documentation

## 2012-11-29 DIAGNOSIS — I10 Essential (primary) hypertension: Secondary | ICD-10-CM | POA: Insufficient documentation

## 2012-11-29 DIAGNOSIS — R51 Headache: Secondary | ICD-10-CM | POA: Insufficient documentation

## 2012-11-29 DIAGNOSIS — I671 Cerebral aneurysm, nonruptured: Secondary | ICD-10-CM | POA: Insufficient documentation

## 2012-11-29 LAB — CBC WITH DIFFERENTIAL/PLATELET
HCT: 47.1 % (ref 39.0–52.0)
Hemoglobin: 16 g/dL (ref 13.0–17.0)
Lymphocytes Relative: 38 % (ref 12–46)
Monocytes Absolute: 0.6 10*3/uL (ref 0.1–1.0)
Monocytes Relative: 8 % (ref 3–12)
Neutro Abs: 3.6 10*3/uL (ref 1.7–7.7)
WBC: 7.5 10*3/uL (ref 4.0–10.5)

## 2012-11-29 LAB — GLUCOSE, CAPILLARY
Glucose-Capillary: 174 mg/dL — ABNORMAL HIGH (ref 70–99)
Glucose-Capillary: 163 mg/dL — ABNORMAL HIGH (ref 70–99)

## 2012-11-29 LAB — BASIC METABOLIC PANEL
BUN: 15 mg/dL (ref 6–23)
Chloride: 102 mEq/L (ref 96–112)
GFR calc Af Amer: >90 mL/min (ref >90)
Potassium: 4.1 mEq/L (ref 3.5–5.1)

## 2012-11-29 LAB — APTT: aPTT: 30 seconds (ref 24–37)

## 2012-11-29 MED ORDER — FENTANYL CITRATE 0.05 MG/ML IJ SOLN
INTRAMUSCULAR | Status: DC | PRN
  Administered 2012-11-29 (×3): 25 ug via INTRAVENOUS

## 2012-11-29 MED ORDER — FENTANYL CITRATE 0.05 MG/ML IJ SOLN
INTRAMUSCULAR | Status: AC
  Filled 2012-11-29: qty 2

## 2012-11-29 MED ORDER — MIDAZOLAM HCL 2 MG/2ML IJ SOLN
INTRAMUSCULAR | Status: DC | PRN
  Administered 2012-11-29 (×3): 1 mg via INTRAVENOUS

## 2012-11-29 MED ORDER — HEPARIN SOD (PORK) LOCK FLUSH 100 UNIT/ML IV SOLN
INTRAVENOUS | Status: DC | PRN
  Administered 2012-11-29: 500 [IU] via INTRAVENOUS

## 2012-11-29 MED ORDER — MIDAZOLAM HCL 2 MG/2ML IJ SOLN
INTRAMUSCULAR | Status: AC
  Filled 2012-11-29: qty 4

## 2012-11-29 MED ORDER — SODIUM CHLORIDE 0.9 % IV SOLN
Freq: Once | INTRAVENOUS | Status: AC
  Administered 2012-11-29: 1000 mL via INTRAVENOUS

## 2012-11-29 MED ORDER — SODIUM CHLORIDE 0.9 % IV SOLN: INTRAVENOUS | Status: AC

## 2012-11-29 NOTE — ED Notes (Signed)
Bilateral DP/PT 3+

## 2012-11-29 NOTE — ED Notes (Signed)
Patient denies pain and is resting comfortably.  

## 2012-11-29 NOTE — ED Notes (Signed)
Requested bed from short stay, spoke with Wandra Mannan

## 2012-11-29 NOTE — Procedures (Signed)
S/P bilateral carotid arteriograms  Preliminary findings. Nearly completely obliterated rt MCA trifurcation aneurysm

## 2012-11-29 NOTE — H&P (Signed)
Paul Paul is an 56 y.o. male.   Chief Complaint: R middle cerebral artery aneurysm coil 09/2012 Today for recheck Occasional headache recently - feels related to stress over wifes Cancer surgery Scheduled for cerebral arteriogram  HPI: HTN; DM; pacemaker; cerebral aneurysm  Past Medical History  Diagnosis Date  . HYPERTENSION 12/10/2007  . Diabetes mellitus     now under control  . Pacemaker   . Bradycardia   . Dysrhythmia     BRADYCARDIA REQUIRING PACEMAKER .FOLLOWED BY GREGG TAYLOR  . Sleep apnea     CPAP.NOT WEARING NOW.NEEDS REPLACEMENT PIECE  . Arthritis   . Shoulder pain     L SHOULDER. RECENT FALL  . Shortness of breath     WITH EXERTION  . Anxiety   . Bell palsy     > 30 years  . Constipation   . Cerebral aneurysm     Past Surgical History  Procedure Date  . Septal deviation   . Knee surgery     left; arthoscopic  . Insert / replace / remove pacemaker   . Rotator cuff surgery 02/2012  . Cardiac catheterization 07/06/11    normal  . Fracture surgery     Family History  Problem Relation Age of Onset  . Heart attack Mother   . Heart disease Mother   . Diabetes Mother   . Heart attack Father   . Heart disease Father    Social History:  reports that he quit smoking about 16 months ago. His smoking use included Cigarettes. He has a 20 pack-year smoking history. He does not have any smokeless tobacco history on file. He reports that he does not drink alcohol or use illicit drugs.  Allergies:  Allergies  Allergen Reactions  . Losartan Potassium-Hctz Other (See Comments)     dizziness, fatigue  . Prednisone Other (See Comments)    Raises blood sugar  . Doxycycline Rash  . Lisinopril Rash     (Not in a hospital admission)  Results for orders placed during the hospital encounter of 11/29/12 (from the past 48 hour(s))  GLUCOSE, CAPILLARY     Status: Abnormal   Collection Time   11/29/12  7:33 AM      Component Value Range Comment   Glucose-Capillary  174 (*) 70 - 99 mg/dL    No results found.  Review of Systems  Constitutional: Negative for fever and weight loss.  HENT:       Only recent and only occas  Eyes: Negative for blurred vision.  Respiratory: Negative for shortness of breath.   Cardiovascular: Negative for chest pain.  Gastrointestinal: Negative for nausea and vomiting.  Neurological: Positive for headaches. Negative for dizziness, focal weakness and weakness.    Blood pressure 134/77, pulse 60, temperature 98.5 F (36.9 C), temperature source Oral, resp. rate 18, height 6\' 3"  (1.905 m), weight 300 lb (136.079 kg), SpO2 96.00%. Physical Exam  Constitutional: He is oriented to person, place, and time. He appears well-nourished.  Cardiovascular: Normal rate, regular rhythm and normal heart sounds.   No murmur heard. Respiratory: Effort normal and breath sounds normal. He has no wheezes.  GI: Soft. Bowel sounds are normal. There is tenderness.  Musculoskeletal: Normal range of motion.  Neurological: He is alert and oriented to person, place, and time.  Psychiatric: He has a normal mood and affect. His behavior is normal. Judgment and thought content normal.     Assessment/Plan R MCA aneurysm coil 09/2012 For recheck arteriogram today Pt  aware of procedure benefits and risks and agreeable to proceed. Consent signed and in chart  Louna Rothgeb A 11/29/2012, 7:39 AM

## 2012-11-29 NOTE — Progress Notes (Signed)
Right groin dressing changed

## 2012-11-29 NOTE — Progress Notes (Signed)
UP AND WALKED AND TOL WELL; RIGHT GROIN STABLE; NO BLEEDING OR HEMATOMA 

## 2012-11-29 NOTE — ED Notes (Signed)
Bilateral DP/PT 3+ 

## 2012-12-30 ENCOUNTER — Encounter: Payer: Self-pay | Admitting: Family

## 2012-12-30 ENCOUNTER — Ambulatory Visit (INDEPENDENT_AMBULATORY_CARE_PROVIDER_SITE_OTHER): Payer: Managed Care, Other (non HMO) | Admitting: Family

## 2012-12-30 VITALS — BP 140/78 | HR 86 | Temp 97.7°F | Wt 307.0 lb

## 2012-12-30 DIAGNOSIS — R059 Cough, unspecified: Secondary | ICD-10-CM

## 2012-12-30 DIAGNOSIS — R05 Cough: Secondary | ICD-10-CM

## 2012-12-30 DIAGNOSIS — E119 Type 2 diabetes mellitus without complications: Secondary | ICD-10-CM

## 2012-12-30 DIAGNOSIS — J069 Acute upper respiratory infection, unspecified: Secondary | ICD-10-CM

## 2012-12-30 NOTE — Patient Instructions (Addendum)
1. Obtain zyrtec once a day.   Upper Respiratory Infection, Adult An upper respiratory infection (URI) is also known as the common cold. It is often caused by a type of germ (virus). Colds are easily spread (contagious). You can pass it to others by kissing, coughing, sneezing, or drinking out of the same glass. Usually, you get better in 1 or 2 weeks.  HOME CARE   Only take medicine as told by your doctor.  Use a warm mist humidifier or breathe in steam from a hot shower.  Drink enough water and fluids to keep your pee (urine) clear or pale yellow.  Get plenty of rest.  Return to work when your temperature is back to normal or as told by your doctor. You may use a face mask and wash your hands to stop your cold from spreading. GET HELP RIGHT AWAY IF:   After the first few days, you feel you are getting worse.  You have questions about your medicine.  You have chills, shortness of breath, or brown or red spit (mucus).  You have yellow or brown snot (nasal discharge) or pain in the face, especially when you bend forward.  You have a fever, puffy (swollen) neck, pain when you swallow, or white spots in the back of your throat.  You have a bad headache, ear pain, sinus pain, or chest pain.  You have a high-pitched whistling sound when you breathe in and out (wheezing).  You have a lasting cough or cough up blood.  You have sore muscles or a stiff neck. MAKE SURE YOU:   Understand these instructions.  Will watch your condition.  Will get help right away if you are not doing well or get worse. Document Released: 05/08/2008 Document Revised: 02/12/2012 Document Reviewed: 03/27/2011 [facility] Patient Information 2013 [place], [place].

## 2012-12-30 NOTE — Progress Notes (Signed)
Subjective:    Patient ID: Paul Paul, male    DOB: 09-Jan-1956, 57 y.o.   MRN: [id number]  HPI 57 year old white male, nonsmoker, patient of Dr. Cato Mulligan is in today with complaints of sore throat and cough times one day. He has taken to amoxicillin tablets but no over-the-counter medications. Denies any fever, muscle aches or pain. Has a history of type 2 diabetes. Blood sugar this morning 140.   Review of Systems  Constitutional: Negative.   HENT: Positive for congestion and sore throat.   Respiratory: Positive for cough.   Cardiovascular: Negative.   Musculoskeletal: Negative.   Skin: Negative.   Neurological: Negative.   Hematological: Negative.   Psychiatric/Behavioral: Negative.    Past Medical History  Diagnosis Date  . HYPERTENSION 12/10/2007  . Diabetes mellitus     now under control  . Pacemaker   . Bradycardia   . Dysrhythmia     BRADYCARDIA REQUIRING PACEMAKER .FOLLOWED BY GREGG TAYLOR  . Sleep apnea     CPAP.NOT WEARING NOW.NEEDS REPLACEMENT PIECE  . Arthritis   . Shoulder pain     L SHOULDER. RECENT FALL  . Shortness of breath     WITH EXERTION  . Anxiety   . Bell palsy     > 30 years  . Constipation   . Cerebral aneurysm     History   Social History  . Marital Status: Married    Spouse Name: N/A    Number of Children: N/A  . Years of Education: N/A   Occupational History  . Not on file.   Social History Main Topics  . Smoking status: Former Smoker -- 1.0 packs/day for 20 years    Types: Cigarettes    Quit date: 07/05/2011  . Smokeless tobacco: Not on file  . Alcohol Use: No  . Drug Use: No  . Sexually Active:    Other Topics Concern  . Not on file   Social History Narrative   Not smoking     Past Surgical History  Procedure Date  . Septal deviation   . Knee surgery     left; arthoscopic  . Insert / replace / remove pacemaker   . Rotator cuff surgery 02/2012  . Cardiac catheterization 07/06/11    normal  . Fracture surgery      Family History  Problem Relation Age of Onset  . Heart attack Mother   . Heart disease Mother   . Diabetes Mother   . Heart attack Father   . Heart disease Father     Allergies  Allergen Reactions  . Losartan Potassium-Hctz Other (See Comments)     dizziness, fatigue  . Prednisone Other (See Comments)    Raises blood sugar  . Doxycycline Rash  . Lisinopril Rash    Current Outpatient Prescriptions on File Prior to Visit  Medication Sig Dispense Refill  . aspirin EC 325 MG tablet Take 325 mg by mouth daily.      . metFORMIN (GLUCOPHAGE) 500 MG tablet Take 1 tablet (500 mg total) by mouth 2 (two) times daily with a meal.  180 tablet  1  . metoprolol succinate (TOPROL-XL) 100 MG 24 hr tablet Take 100 mg by mouth every morning.      . polyethylene glycol (MIRALAX / GLYCOLAX) packet Take 17 g by mouth every other day.      . sennosides-docusate sodium (SENOKOT-S) 8.6-50 MG tablet Take 2 tablets by mouth daily as needed. For constipation  BP 140/78  Pulse 86  Temp 97.7 F (36.5 C) (Oral)  Wt 307 lb (139.254 kg)  SpO2 98%chart    Objective:   Physical Exam  Constitutional: He is oriented to person, place, and time. He appears well-developed and well-nourished.  HENT:  Right Ear: External ear normal.  Left Ear: External ear normal.  Nose: Nose normal.  Mouth/Throat: Oropharynx is clear and moist.  Neck: Normal range of motion. Neck supple.  Cardiovascular: Normal rate, regular rhythm and normal heart sounds.   Pulmonary/Chest: Effort normal and breath sounds normal.  Neurological: He is oriented to person, place, and time.  Skin: Skin is warm and dry.  Psychiatric: He has a normal mood and affect.          Assessment & Plan:  Assessment:  1. Upper or infection 2. Pharyngitis 3. Type 2 diabetes  Plan: Over-the-counter symptomatic treatment for relief. Rest. Drink plenty of fluids. Patient call the office if symptoms worsen or persist. Recheck as  scheduled and when necessary.

## 2013-01-10 ENCOUNTER — Other Ambulatory Visit: Payer: Self-pay | Admitting: Internal Medicine

## 2013-01-10 DIAGNOSIS — E119 Type 2 diabetes mellitus without complications: Secondary | ICD-10-CM

## 2013-01-10 MED ORDER — METOPROLOL SUCCINATE ER 100 MG PO TB24: 100.0000 mg | ORAL_TABLET | Freq: Every morning | ORAL | Status: DC

## 2013-01-10 MED ORDER — METFORMIN HCL 500 MG PO TABS: 500.0000 mg | ORAL_TABLET | Freq: Two times a day (BID) | ORAL | Status: DC

## 2013-01-10 NOTE — Telephone Encounter (Signed)
rx sent in electronically 

## 2013-01-10 NOTE — Telephone Encounter (Signed)
Pt needs metformin 500mg  twice a day #180 and metoprolol 100 mg #90 [facility]

## 2013-01-20 ENCOUNTER — Ambulatory Visit (INDEPENDENT_AMBULATORY_CARE_PROVIDER_SITE_OTHER): Payer: Managed Care, Other (non HMO) | Admitting: *Deleted

## 2013-01-20 ENCOUNTER — Other Ambulatory Visit: Payer: Self-pay | Admitting: Internal Medicine

## 2013-01-20 DIAGNOSIS — Z8679 Personal history of other diseases of the circulatory system: Secondary | ICD-10-CM

## 2013-01-20 DIAGNOSIS — Z950 Presence of cardiac pacemaker: Secondary | ICD-10-CM

## 2013-01-20 DIAGNOSIS — I498 Other specified cardiac arrhythmias: Secondary | ICD-10-CM

## 2013-01-22 LAB — REMOTE PACEMAKER DEVICE
ATRIAL PACING PM: 77
BAMS-0001: 170 {beats}/min
BAMS-0003: 70 {beats}/min
DEVICE MODEL PM: 7250770
VENTRICULAR PACING PM: 1

## 2013-01-27 ENCOUNTER — Ambulatory Visit (INDEPENDENT_AMBULATORY_CARE_PROVIDER_SITE_OTHER): Payer: Managed Care, Other (non HMO) | Admitting: *Deleted

## 2013-01-27 ENCOUNTER — Telehealth: Payer: Self-pay | Admitting: Internal Medicine

## 2013-01-27 DIAGNOSIS — I498 Other specified cardiac arrhythmias: Secondary | ICD-10-CM

## 2013-01-27 NOTE — Telephone Encounter (Signed)
New problem    C/O heard 5 beats on yesterday. H/O Visual merchandiser.

## 2013-01-27 NOTE — Progress Notes (Signed)
Pt thought ppm was beeping. Interrogation shows no alerts. Alert tones demonstrated and pt and wife now knows what to hear from ppm. No charge for interrogation. Pt was also given cell adapter. Pt tried to send manual transmission with merlin that was unsuccessful. Merlin has worked in past.

## 2013-01-27 NOTE — Telephone Encounter (Addendum)
Heard 5 beeps yesterday around 5 pm from his device.  He is going to send in a remote transmission and we will call him back after looking at it

## 2013-01-27 NOTE — Telephone Encounter (Signed)
Pt was scheduled for device check in office.

## 2013-02-05 ENCOUNTER — Encounter: Payer: Self-pay | Admitting: *Deleted

## 2013-02-12 ENCOUNTER — Encounter: Payer: Self-pay | Admitting: Internal Medicine

## 2013-04-21 ENCOUNTER — Encounter: Payer: Self-pay | Admitting: Internal Medicine

## 2013-04-21 ENCOUNTER — Ambulatory Visit (INDEPENDENT_AMBULATORY_CARE_PROVIDER_SITE_OTHER): Payer: Managed Care, Other (non HMO) | Admitting: *Deleted

## 2013-04-21 DIAGNOSIS — Z950 Presence of cardiac pacemaker: Secondary | ICD-10-CM

## 2013-04-21 DIAGNOSIS — Z8679 Personal history of other diseases of the circulatory system: Secondary | ICD-10-CM

## 2013-04-21 DIAGNOSIS — I498 Other specified cardiac arrhythmias: Secondary | ICD-10-CM

## 2013-04-21 LAB — REMOTE PACEMAKER DEVICE
AL AMPLITUDE: 5 mv
BAMS-0001: 170 {beats}/min
BAMS-0003: 70 {beats}/min
DEVICE MODEL PM: 7250770
RV LEAD AMPLITUDE: 11.5 mv
RV LEAD IMPEDENCE PM: 460 Ohm
RV LEAD THRESHOLD: 1.25 V

## 2013-04-24 ENCOUNTER — Encounter: Payer: Self-pay | Admitting: *Deleted

## 2013-05-07 ENCOUNTER — Telehealth: Payer: Self-pay | Admitting: Internal Medicine

## 2013-05-07 NOTE — Telephone Encounter (Signed)
New Prob    Pt would like to know what kind of magnets he needs to stay away from with having a pacemaker. Please call,.

## 2013-05-07 NOTE — Telephone Encounter (Signed)
Spoke with patient.  He was concerned about being around his grandson's train set that has magnets.  I explained as long as he doesn't put them up to his chest it shouldn't be a problem.  I also reassured the patient that even if he did get too close it doesn't not inhibit the pacemaker only make him pace @ 98 bpm.

## 2013-05-09 ENCOUNTER — Ambulatory Visit: Payer: Self-pay | Admitting: Neurology

## 2013-05-13 ENCOUNTER — Ambulatory Visit: Payer: Managed Care, Other (non HMO) | Admitting: Internal Medicine

## 2013-05-28 ENCOUNTER — Encounter: Payer: Self-pay | Admitting: Internal Medicine

## 2013-06-12 ENCOUNTER — Other Ambulatory Visit: Payer: Self-pay

## 2013-07-14 ENCOUNTER — Other Ambulatory Visit (HOSPITAL_COMMUNITY): Payer: Self-pay | Admitting: Interventional Radiology

## 2013-07-14 DIAGNOSIS — I729 Aneurysm of unspecified site: Secondary | ICD-10-CM

## 2013-07-17 ENCOUNTER — Other Ambulatory Visit: Payer: Self-pay | Admitting: Radiology

## 2013-07-22 ENCOUNTER — Encounter (HOSPITAL_COMMUNITY): Payer: Self-pay | Admitting: Pharmacy Technician

## 2013-07-24 ENCOUNTER — Ambulatory Visit (HOSPITAL_COMMUNITY): Admission: RE | Admit: 2013-07-24 | Payer: Managed Care, Other (non HMO) | Source: Ambulatory Visit

## 2013-07-25 ENCOUNTER — Other Ambulatory Visit (HOSPITAL_COMMUNITY): Payer: Self-pay | Admitting: Interventional Radiology

## 2013-07-25 ENCOUNTER — Encounter (HOSPITAL_COMMUNITY): Payer: Self-pay

## 2013-07-25 ENCOUNTER — Ambulatory Visit (HOSPITAL_COMMUNITY)
Admission: RE | Admit: 2013-07-25 | Discharge: 2013-07-25 | Disposition: A | Discharge status: Home / Self Care | Payer: Managed Care, Other (non HMO) | Source: Ambulatory Visit | Attending: Interventional Radiology | Admitting: Interventional Radiology

## 2013-07-25 DIAGNOSIS — M129 Arthropathy, unspecified: Secondary | ICD-10-CM | POA: Insufficient documentation

## 2013-07-25 DIAGNOSIS — E119 Type 2 diabetes mellitus without complications: Secondary | ICD-10-CM | POA: Insufficient documentation

## 2013-07-25 DIAGNOSIS — I498 Other specified cardiac arrhythmias: Secondary | ICD-10-CM | POA: Insufficient documentation

## 2013-07-25 DIAGNOSIS — F411 Generalized anxiety disorder: Secondary | ICD-10-CM | POA: Insufficient documentation

## 2013-07-25 DIAGNOSIS — G510 Bell's palsy: Secondary | ICD-10-CM | POA: Insufficient documentation

## 2013-07-25 DIAGNOSIS — G4730 Sleep apnea, unspecified: Secondary | ICD-10-CM | POA: Insufficient documentation

## 2013-07-25 DIAGNOSIS — Z888 Allergy status to other drugs, medicaments and biological substances status: Secondary | ICD-10-CM | POA: Insufficient documentation

## 2013-07-25 DIAGNOSIS — Z882 Allergy status to sulfonamides status: Secondary | ICD-10-CM | POA: Insufficient documentation

## 2013-07-25 DIAGNOSIS — I671 Cerebral aneurysm, nonruptured: Secondary | ICD-10-CM | POA: Insufficient documentation

## 2013-07-25 DIAGNOSIS — I729 Aneurysm of unspecified site: Secondary | ICD-10-CM

## 2013-07-25 DIAGNOSIS — I10 Essential (primary) hypertension: Secondary | ICD-10-CM | POA: Insufficient documentation

## 2013-07-25 DIAGNOSIS — Z87891 Personal history of nicotine dependence: Secondary | ICD-10-CM | POA: Insufficient documentation

## 2013-07-25 LAB — CBC WITH DIFFERENTIAL/PLATELET
Eosinophils Absolute: 0.3 10*3/uL (ref 0.0–0.7)
Hemoglobin: 16.3 g/dL (ref 13.0–17.0)
Lymphocytes Relative: 44 % (ref 12–46)
Lymphs Abs: 2.6 10*3/uL (ref 0.7–4.0)
MCH: 32.5 pg (ref 26.0–34.0)
Monocytes Relative: 9 % (ref 3–12)
Neutrophils Relative %: 41 % — ABNORMAL LOW (ref 43–77)
Platelets: 255 10*3/uL (ref 150–400)
RBC: 5.01 MIL/uL (ref 4.22–5.81)
WBC: 6 10*3/uL (ref 4.0–10.5)

## 2013-07-25 LAB — GLUCOSE, CAPILLARY: Glucose-Capillary: 248 mg/dL — ABNORMAL HIGH (ref 70–99)

## 2013-07-25 LAB — BASIC METABOLIC PANEL
CO2: 24 mEq/L (ref 19–32)
Calcium: 9.6 mg/dL (ref 8.4–10.5)
GFR calc non Af Amer: >90 mL/min (ref >90)
Glucose, Bld: 252 mg/dL — ABNORMAL HIGH (ref 70–99)
Potassium: 4.3 mEq/L (ref 3.5–5.1)
Sodium: 137 mEq/L (ref 135–145)

## 2013-07-25 LAB — APTT: aPTT: 30 seconds (ref 24–37)

## 2013-07-25 LAB — PROTIME-INR
INR: 0.99 (ref 0.00–1.49)
Prothrombin Time: 12.9 seconds (ref 11.6–15.2)

## 2013-07-25 MED ORDER — FENTANYL CITRATE 0.05 MG/ML IJ SOLN
INTRAMUSCULAR | Status: AC
  Filled 2013-07-25: qty 2

## 2013-07-25 MED ORDER — MIDAZOLAM HCL 2 MG/2ML IJ SOLN
INTRAMUSCULAR | Status: AC
  Filled 2013-07-25: qty 2

## 2013-07-25 MED ORDER — SODIUM CHLORIDE 0.9 % IV SOLN: Freq: Once | INTRAVENOUS | Status: DC

## 2013-07-25 MED ORDER — HEPARIN SOD (PORK) LOCK FLUSH 100 UNIT/ML IV SOLN
INTRAVENOUS | Status: AC | PRN
  Administered 2013-07-25: 500 [IU] via INTRAVENOUS

## 2013-07-25 MED ORDER — SODIUM CHLORIDE 0.9 % IV SOLN: INTRAVENOUS | Status: AC

## 2013-07-25 MED ORDER — MIDAZOLAM HCL 2 MG/2ML IJ SOLN
INTRAMUSCULAR | Status: AC | PRN
  Administered 2013-07-25: 1 mg via INTRAVENOUS
  Administered 2013-07-25 (×2): 0.5 mg via INTRAVENOUS

## 2013-07-25 MED ORDER — IOHEXOL 300 MG/ML  SOLN
150.0000 mL | Freq: Once | INTRAMUSCULAR | Status: AC | PRN
  Administered 2013-07-25: 60 mL via INTRAVENOUS

## 2013-07-25 MED ORDER — INSULIN ASPART 100 UNIT/ML ~~LOC~~ SOLN: 0.0000 [IU] | Freq: Three times a day (TID) | SUBCUTANEOUS | Status: DC

## 2013-07-25 MED ORDER — INSULIN ASPART 100 UNIT/ML ~~LOC~~ SOLN
4.0000 [IU] | Freq: Once | SUBCUTANEOUS | Status: AC
  Administered 2013-07-25: 4 [IU] via SUBCUTANEOUS

## 2013-07-25 MED ORDER — FENTANYL CITRATE 0.05 MG/ML IJ SOLN
INTRAMUSCULAR | Status: AC | PRN
  Administered 2013-07-25: 12.5 ug via INTRAVENOUS
  Administered 2013-07-25 (×2): 25 ug via INTRAVENOUS

## 2013-07-25 NOTE — Procedures (Signed)
S/p 4 vessel cerebral arteriogram. RT CFA approach. Findings. 1Approx 2.47mm x 0.9 mm RT MCA aneurysm neck remnant

## 2013-07-25 NOTE — H&P (Signed)
Paul Paul is an 57 y.o. male.   Chief Complaint: Hx R middle cerebral artery aneurysm coiling/stent Staged procedure: 07/2012; 09/2012.  Cerebral arteriogram 12/2012- stable Now scheduled for re check cerebral arteriogram Pt without sxs HPI: HTN; DM; pacemaker; sleep apnea  Past Medical History  Diagnosis Date  . HYPERTENSION 12/10/2007  . Diabetes mellitus     now under control  . Pacemaker   . Bradycardia   . Dysrhythmia     BRADYCARDIA REQUIRING PACEMAKER .FOLLOWED BY GREGG TAYLOR  . Sleep apnea     CPAP.NOT WEARING NOW.NEEDS REPLACEMENT PIECE  . Arthritis   . Shoulder pain     L SHOULDER. RECENT FALL  . Shortness of breath     WITH EXERTION  . Anxiety   . Bell palsy     > 30 years  . Constipation   . Cerebral aneurysm     Past Surgical History  Procedure Laterality Date  . Septal deviation    . Knee surgery      left; arthoscopic  . Insert / replace / remove pacemaker    . Rotator cuff surgery  02/2012  . Cardiac catheterization  07/06/11    normal  . Fracture surgery      Family History  Problem Relation Age of Onset  . Heart attack Mother   . Heart disease Mother   . Diabetes Mother   . Heart attack Father   . Heart disease Father    Social History:  reports that he quit smoking about 2 years ago. His smoking use included Cigarettes. He has a 20 pack-year smoking history. He does not have any smokeless tobacco history on file. He reports that he does not drink alcohol or use illicit drugs.  Allergies:  Allergies  Allergen Reactions  . Losartan Potassium-Hctz Other (See Comments)     dizziness, fatigue  . Prednisone Other (See Comments)    Raises blood sugar  . Doxycycline Rash  . Lisinopril Rash     (Not in a hospital admission)  Results for orders placed during the hospital encounter of 07/25/13 (from the past 48 hour(s))  GLUCOSE, CAPILLARY     Status: Abnormal   Collection Time    07/25/13  6:49 AM      Result Value Range   Glucose-Capillary 248 (*) 70 - 99 mg/dL  BASIC METABOLIC PANEL     Status: Abnormal   Collection Time    07/25/13  6:58 AM      Result Value Range   Sodium 137  135 - 145 mEq/L   Potassium 4.3  3.5 - 5.1 mEq/L   Comment: HEMOLYSIS AT THIS LEVEL MAY AFFECT RESULT   Chloride 101  96 - 112 mEq/L   CO2 24  19 - 32 mEq/L   Glucose, Bld 252 (*) 70 - 99 mg/dL   BUN 11  6 - 23 mg/dL   Creatinine, Ser 8.11  0.50 - 1.35 mg/dL   Calcium 9.6  8.4 - 91.4 mg/dL   GFR calc non Af Amer >90  >90 mL/min   GFR calc Af Amer >90  >90 mL/min   Comment: (NOTE)     The eGFR has been calculated using the CKD EPI equation.     This calculation has not been validated in all clinical situations.     eGFR's persistently <90 mL/min signify possible Chronic Kidney     Disease.  CBC WITH DIFFERENTIAL     Status: Abnormal   Collection Time  07/25/13  6:58 AM      Result Value Range   WBC 6.0  4.0 - 10.5 K/uL   RBC 5.01  4.22 - 5.81 MIL/uL   Hemoglobin 16.3  13.0 - 17.0 g/dL   HCT 64.4  03.4 - 74.2 %   MCV 89.4  78.0 - 100.0 fL   MCH 32.5  26.0 - 34.0 pg   MCHC 36.4 (*) 30.0 - 36.0 g/dL   RDW 59.5  63.8 - 75.6 %   Platelets 255  150 - 400 K/uL   Neutrophils Relative % 41 (*) 43 - 77 %   Neutro Abs 2.4  1.7 - 7.7 K/uL   Lymphocytes Relative 44  12 - 46 %   Lymphs Abs 2.6  0.7 - 4.0 K/uL   Monocytes Relative 9  3 - 12 %   Monocytes Absolute 0.5  0.1 - 1.0 K/uL   Eosinophils Relative 5  0 - 5 %   Eosinophils Absolute 0.3  0.0 - 0.7 K/uL   Basophils Relative 2 (*) 0 - 1 %   Basophils Absolute 0.1  0.0 - 0.1 K/uL   No results found.  Review of Systems  Constitutional: Negative for fever and weight loss.  HENT: Negative for neck pain.        Occasional- mild  Eyes: Negative for blurred vision and double vision.  Respiratory: Negative for shortness of breath.   Cardiovascular: Negative for chest pain.  Gastrointestinal: Negative for nausea, vomiting and abdominal pain.  Musculoskeletal: Negative for  back pain.  Neurological: Positive for headaches. Negative for dizziness, tingling and weakness.    Blood pressure 157/81, pulse 68, temperature 97.5 F (36.4 C), temperature source Oral, resp. rate 18, height 6\' 3"  (1.905 m), weight 283 lb (128.368 kg), SpO2 96.00%. Physical Exam  Constitutional: He is oriented to person, place, and time. He appears well-developed and well-nourished.  Cardiovascular: Normal rate, regular rhythm and normal heart sounds.   No murmur heard. pacemaker  Respiratory: Effort normal and breath sounds normal. He has no wheezes.  GI: Soft. Bowel sounds are normal. There is no tenderness.  Musculoskeletal: Normal range of motion. He exhibits no edema and no tenderness.  Neurological: He is alert and oriented to person, place, and time. Coordination normal.  Skin: Skin is warm and dry.  Psychiatric: He has a normal mood and affect. His behavior is normal. Judgment and thought content normal.     Assessment/Plan R MCA aneurysm staged coiling/stent 07/2012; 09/2012 Scheduled for cerebral arteriogram further follow up today Pt aware of procedure benefits and risks and agreeable to proceed Consent signed and in chart  [name] 07/25/2013, 8:04 AM

## 2013-08-15 ENCOUNTER — Encounter: Payer: Managed Care, Other (non HMO) | Admitting: Internal Medicine

## 2013-09-26 ENCOUNTER — Encounter (HOSPITAL_COMMUNITY): Payer: Self-pay | Admitting: Emergency Medicine

## 2013-09-26 ENCOUNTER — Emergency Department (HOSPITAL_COMMUNITY): Payer: Medicare PPO

## 2013-09-26 ENCOUNTER — Emergency Department (HOSPITAL_COMMUNITY)
Admission: EM | Admit: 2013-09-26 | Discharge: 2013-09-26 | Disposition: A | Discharge status: Home / Self Care | Payer: Medicare PPO | Attending: Emergency Medicine | Admitting: Emergency Medicine

## 2013-09-26 DIAGNOSIS — R42 Dizziness and giddiness: Secondary | ICD-10-CM

## 2013-09-26 DIAGNOSIS — Z950 Presence of cardiac pacemaker: Secondary | ICD-10-CM | POA: Insufficient documentation

## 2013-09-26 DIAGNOSIS — Z7982 Long term (current) use of aspirin: Secondary | ICD-10-CM | POA: Insufficient documentation

## 2013-09-26 DIAGNOSIS — F411 Generalized anxiety disorder: Secondary | ICD-10-CM | POA: Insufficient documentation

## 2013-09-26 DIAGNOSIS — Z87891 Personal history of nicotine dependence: Secondary | ICD-10-CM | POA: Insufficient documentation

## 2013-09-26 DIAGNOSIS — G4730 Sleep apnea, unspecified: Secondary | ICD-10-CM | POA: Insufficient documentation

## 2013-09-26 DIAGNOSIS — R55 Syncope and collapse: Secondary | ICD-10-CM | POA: Insufficient documentation

## 2013-09-26 DIAGNOSIS — IMO0002 Reserved for concepts with insufficient information to code with codable children: Secondary | ICD-10-CM

## 2013-09-26 DIAGNOSIS — E119 Type 2 diabetes mellitus without complications: Secondary | ICD-10-CM | POA: Insufficient documentation

## 2013-09-26 DIAGNOSIS — Z8719 Personal history of other diseases of the digestive system: Secondary | ICD-10-CM | POA: Insufficient documentation

## 2013-09-26 DIAGNOSIS — I10 Essential (primary) hypertension: Secondary | ICD-10-CM | POA: Insufficient documentation

## 2013-09-26 DIAGNOSIS — M129 Arthropathy, unspecified: Secondary | ICD-10-CM | POA: Insufficient documentation

## 2013-09-26 DIAGNOSIS — Z9889 Other specified postprocedural states: Secondary | ICD-10-CM | POA: Insufficient documentation

## 2013-09-26 DIAGNOSIS — H81399 Other peripheral vertigo, unspecified ear: Secondary | ICD-10-CM | POA: Insufficient documentation

## 2013-09-26 DIAGNOSIS — Z79899 Other long term (current) drug therapy: Secondary | ICD-10-CM | POA: Insufficient documentation

## 2013-09-26 LAB — CBC WITH DIFFERENTIAL/PLATELET
Basophils Absolute: 0.1 10*3/uL (ref 0.0–0.1)
Eosinophils Absolute: 0.2 10*3/uL (ref 0.0–0.7)
Eosinophils Relative: 3 % (ref 0–5)
HCT: 48.3 % (ref 39.0–52.0)
Lymphocytes Relative: 38 % (ref 12–46)
MCH: 32.3 pg (ref 26.0–34.0)
MCV: 91.1 fL (ref 78.0–100.0)
Monocytes Absolute: 0.4 10*3/uL (ref 0.1–1.0)
Platelets: 226 10*3/uL (ref 150–400)
RDW: 12.7 % (ref 11.5–15.5)
WBC: 5.1 10*3/uL (ref 4.0–10.5)

## 2013-09-26 LAB — BASIC METABOLIC PANEL
CO2: 23 mEq/L (ref 19–32)
Calcium: 9.5 mg/dL (ref 8.4–10.5)
Creatinine, Ser: 0.87 mg/dL (ref 0.50–1.35)
GFR calc non Af Amer: >90 mL/min (ref >90)
Glucose, Bld: 140 mg/dL — ABNORMAL HIGH (ref 70–99)

## 2013-09-26 MED ORDER — MECLIZINE HCL 25 MG PO TABS: 25.0000 mg | ORAL_TABLET | Freq: Three times a day (TID) | ORAL | Status: DC | PRN

## 2013-09-26 MED ORDER — MECLIZINE HCL 25 MG PO TABS
25.0000 mg | ORAL_TABLET | Freq: Once | ORAL | Status: AC
  Administered 2013-09-26: 25 mg via ORAL
  Filled 2013-09-26: qty 1

## 2013-09-26 MED ORDER — DIAZEPAM 5 MG PO TABS: 5.0000 mg | ORAL_TABLET | Freq: Four times a day (QID) | ORAL | Status: DC | PRN

## 2013-09-26 MED ORDER — DIAZEPAM 5 MG PO TABS
5.0000 mg | ORAL_TABLET | Freq: Once | ORAL | Status: AC
  Administered 2013-09-26: 5 mg via ORAL
  Filled 2013-09-26: qty 1

## 2013-09-26 NOTE — ED Notes (Signed)
Per EMS- Pt reports that at 0200 he woke up to use the bathroom and was dizzy when he stood. This dizziness persisted through the night but was able to go back to sleep. He told his wife at 0700 about the dizziness and they called EMS. No weakness, or speech impairment noted. Pt has an atrial paced pacemaker and a brain aneurysm. Denies any pain. Dizziness mostly with movement. Skin warm and dry. BP 148/78, HR 70, RR 16, CBG 124. Pt is a x 4

## 2013-09-26 NOTE — ED Provider Notes (Signed)
CSN: [id number]     Arrival date & time 09/26/13  0102 History   First MD Initiated Contact with Patient 09/26/13 [phone]     Chief Complaint  Patient presents with  . Dizziness   (Consider location/radiation/quality/duration/timing/severity/associated sxs/prior Treatment) HPI  This is a 57 year old male with history of hypertension, sleep apnea, brain aneurysm, and bradycardia status post pacemaker placement who presents with dizziness. The patient states that he woke up this morning at 2 AM and when he stood up to go the bathroom he felt dizzy. He describes the sensation as "feeling off balance." He denies any room spinning dizziness. He went back to sleep and when he woke up this morning he continued to have dizziness. He denies any weakness, numbness, or speech disturbance. Patient denies recent illness, nausea, or vomiting. He states that his dizziness is mostly with movement. It is not "as bad" as was this morning. He denies any chest pain shortness of breath.  Past Medical History  Diagnosis Date  . HYPERTENSION 12/10/2007  . Diabetes mellitus     now under control  . Pacemaker   . Bradycardia   . Dysrhythmia     BRADYCARDIA REQUIRING PACEMAKER .FOLLOWED BY GREGG TAYLOR  . Sleep apnea     CPAP.NOT WEARING NOW.NEEDS REPLACEMENT PIECE  . Arthritis   . Shoulder pain     L SHOULDER. RECENT FALL  . Shortness of breath     WITH EXERTION  . Anxiety   . Bell palsy     > 30 years  . Constipation   . Cerebral aneurysm    Past Surgical History  Procedure Laterality Date  . Septal deviation    . Knee surgery      left; arthoscopic  . Insert / replace / remove pacemaker    . Rotator cuff surgery  02/2012  . Cardiac catheterization  07/06/11    normal  . Fracture surgery     Family History  Problem Relation Age of Onset  . Heart attack Mother   . Heart disease Mother   . Diabetes Mother   . Heart attack Father   . Heart disease Father    History  Substance Use Topics  .  Smoking status: Former Smoker -- 1.00 packs/day for 20 years    Types: Cigarettes    Quit date: 07/05/2011  . Smokeless tobacco: Not on file  . Alcohol Use: No    Review of Systems  Constitutional: Negative.  Negative for fever.  Respiratory: Negative.  Negative for chest tightness and shortness of breath.   Cardiovascular: Negative.  Negative for chest pain.  Gastrointestinal: Negative.  Negative for nausea, vomiting and abdominal pain.  Genitourinary: Negative.   Musculoskeletal: Negative for gait problem.  Neurological: Positive for dizziness. Negative for syncope and headaches.  All other systems reviewed and are negative.    Allergies  Losartan potassium-hctz; Prednisone; Doxycycline; and Lisinopril  Home Medications   Current Outpatient Rx  Name  Route  Sig  Dispense  Refill  . aspirin 81 MG tablet   Oral   Take 81 mg by mouth daily.         . metoprolol succinate (TOPROL-XL) 100 MG 24 hr tablet   Oral   Take 1 tablet (100 mg total) by mouth every morning.   90 tablet   3   . Probiotic Product (PROBIOTIC PO)   Oral   Take 1 capsule by mouth daily.         . diazepam (VALIUM)  5 MG tablet   Oral   Take 1 tablet (5 mg total) by mouth every 6 (six) hours as needed (dizziness).   10 tablet   0   . meclizine (ANTIVERT) 25 MG tablet   Oral   Take 1 tablet (25 mg total) by mouth 3 (three) times daily as needed.   30 tablet   0    BP 136/80  Pulse 80  Temp(Src) 98.1 F (36.7 C) (Oral)  Resp 15  Ht 6\' 3"  (1.905 m)  Wt 258 lb (117.028 kg)  BMI 32.25 kg/m2  SpO2 97% Physical Exam  Nursing note and vitals reviewed. Constitutional: He is oriented to person, place, and time. He appears well-developed and well-nourished. No distress.  HENT:  Head: Normocephalic and atraumatic.  Mouth/Throat: Oropharynx is clear and moist.  Eyes: EOM are normal. Pupils are equal, round, and reactive to light.  Neck: Neck supple.  Cardiovascular: Normal rate, regular  rhythm and normal heart sounds.   No murmur heard. Pulmonary/Chest: Effort normal and breath sounds normal. No respiratory distress.  Abdominal: Soft. Bowel sounds are normal. There is no tenderness. There is no rebound.  Musculoskeletal: He exhibits no edema.  Lymphadenopathy:    He has no cervical adenopathy.  Neurological: He is alert and oriented to person, place, and time. He has normal reflexes. No cranial nerve deficit.  Coordination intact to finger-nose-finger, normal reflexes, patient's symptoms reproduced with Dix-Hallpike maneuver however no nystagmus is noted.  Skin: Skin is warm and dry.  Psychiatric: He has a normal mood and affect.    ED Course  Procedures (including critical care time) Labs Review Labs Reviewed  CBC WITH DIFFERENTIAL - Abnormal; Notable for the following:    Hemoglobin 17.1 (*)    All other components within normal limits  BASIC METABOLIC PANEL - Abnormal; Notable for the following:    Glucose, Bld 140 (*)    All other components within normal limits   Imaging Review Ct Head Wo Contrast  09/26/2013   CLINICAL DATA:  Dizziness  EXAM: CT HEAD WITHOUT CONTRAST  TECHNIQUE: Contiguous axial images were obtained from the base of the skull through the vertex without intravenous contrast. Study was obtained within 24 hr of patient's arrival at the emergency department.  COMPARISON:  None.  FINDINGS: The ventricles are normal in size and configuration. There is artifact from an aneurysm clip in the periphery of the anterior right temporal lobe.  There is no demonstrable mass, hemorrhage, extra-axial fluid collection, or midline shift. No focal gray-white compartment lesions are identified. No evidence of acute infarct.  Bony calvarium appears intact. The mastoid air cells are clear.  IMPRESSION: Aneurysm clip in the anterior right temporal lobe peripherally. No intracranial mass or hemorrhage. Gray-white compartments are normal.   Electronically Signed   By:  Bretta Bang M.D.   On: 09/26/2013 10:26    EKG Interpretation     Ventricular Rate:  60 PR Interval:  196 QRS Duration: 105 QT Interval:  396 QTC Calculation: 396 R Axis:   -23 Text Interpretation:  ATRIAL PACED RHYTHM Borderline left axis deviation Borderline T abnormalities, inferior leads No significant change was found            MDM   1. Dizziness   2. Positional vertigo, unspecified laterality    This is a 58 year old male who presents with dizziness. Onset of symptoms was 2 AM this morning. Patient describes feeling off balance and denies any room spinning dizziness. Vital signs are reassuring.  Upon my initial valuation, patient reports improvement of his symptoms but states he still feels somewhat unbalanced.  Patient's neurologic exam is reassuring without evidence of dysmetria or ataxic gait. He has no drift. The Hallpike maneuver reproduced the patient's symptoms but did not show evidence of nystagmus. Patient was given Valium and meclizine. His symptoms completely resolve following treatment. CT scan of the head was obtained given the patient's history of aneurysm and is negative for acute bleed.  Orthostatics are negative and EKG shows a paced rhythm at 60 without evidence of arrhythmia or interval prolongation.  Interrogation of the pacemaker also showed no events.   At this time I have low suspicion for central cause of dizziness as patient has no evidence of cerebellar dysfunction on her exam.  The patient cannot get MRI because of his pacemaker.  While he is not a classic case of BPV, his dizziness does appear to be positional. I discussed my thoughts with the patient. He understands that I cannot fully rule out central cause of dizziness but I have low suspicion for this. He has not a classic case of BPV but we will treat him like this is BPV because his symptoms improved with diet and meclizine. Patient was encouraged to be reevaluated if he has recurrence of  symptoms or worsening. Patient stated understanding.  After history, exam, and medical workup I feel the patient has been appropriately medically screened and is safe for discharge home. Pertinent diagnoses were discussed with the patient. Patient was given return precautions.    Shon Baton, MD 09/26/13 [phone]

## 2013-09-26 NOTE — ED Notes (Signed)
Pt walked around a second time around the nurses station without any dizziness or difficulties.

## 2013-09-26 NOTE — ED Notes (Signed)
Spoke with representative from [facility]. Jude's pacemaker. Reports device functioning appropriately. No abnormal activity noted.

## 2013-10-03 IMAGING — XA IR ANGIO INTRA EXTRACRAN SEL COM CAROTID INNOMINATE BILAT MOD SE
1 series · 14 of 24 positions shown · IV contrast (IODINE)
Comparison: none

CLINICAL DATA: Intermittent headaches.  Past history of
endovascularly treated right MCA trifurcation region aneurysm.

[Series 300: neuro · 14 of 80 slices shown]
[im 1/80]
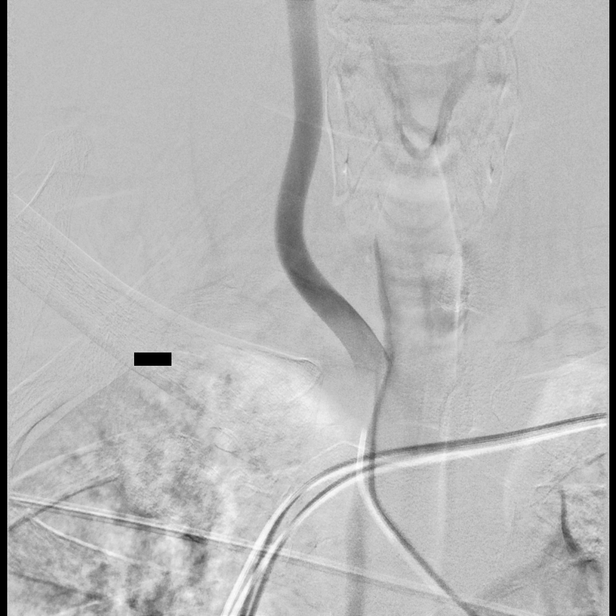
[im 7/80]
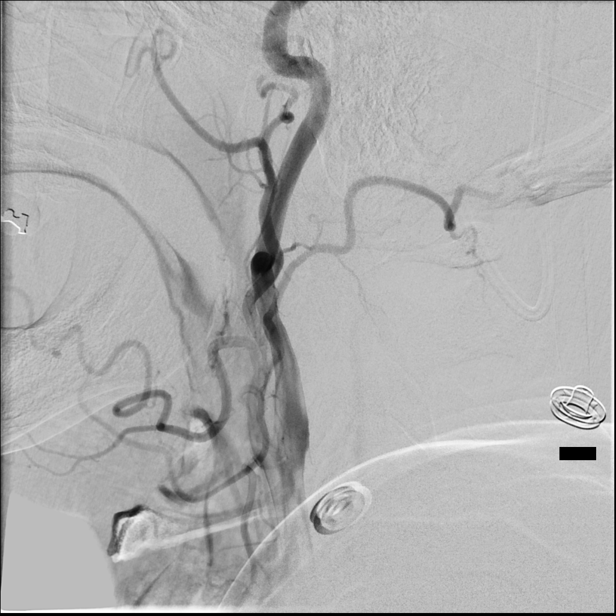
[im 14/80]
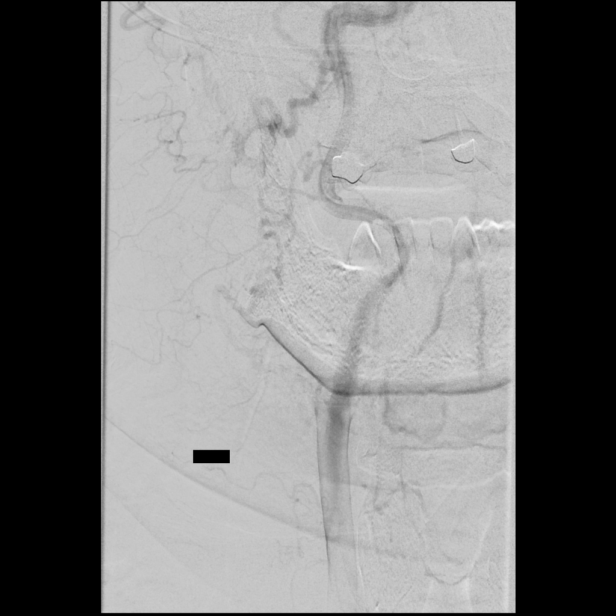
[im 21/80]
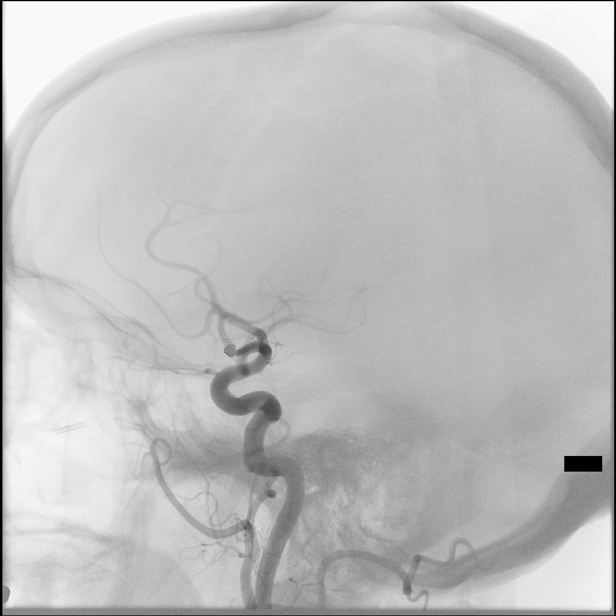
[im 25/80]
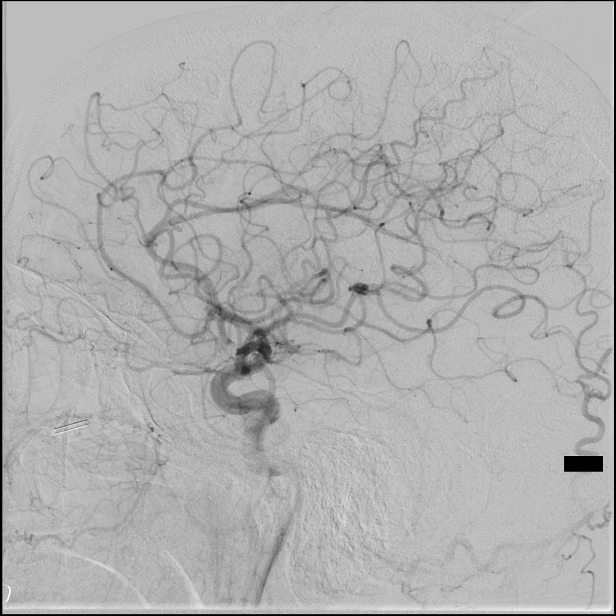
[im 31/80]
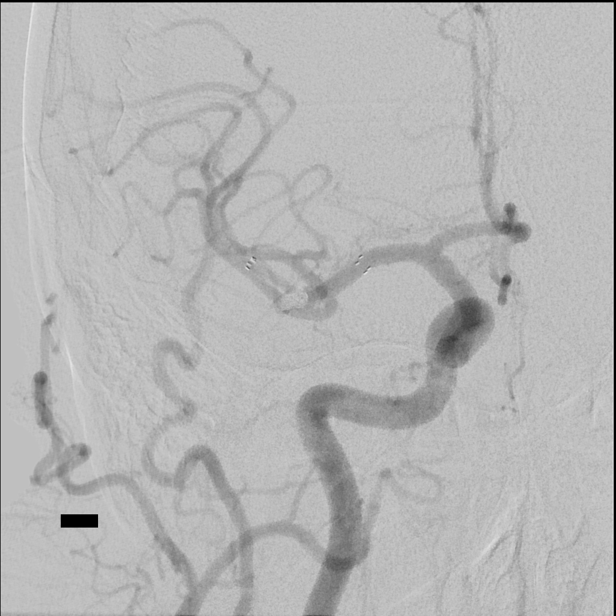
[im 38/80]
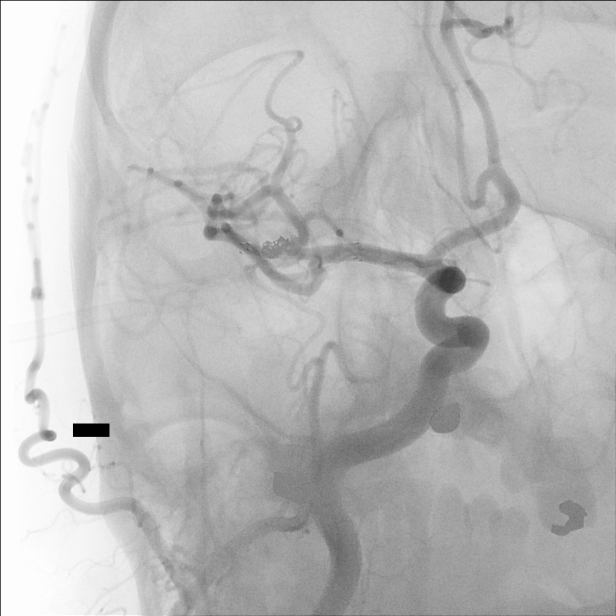
[im 42/80]
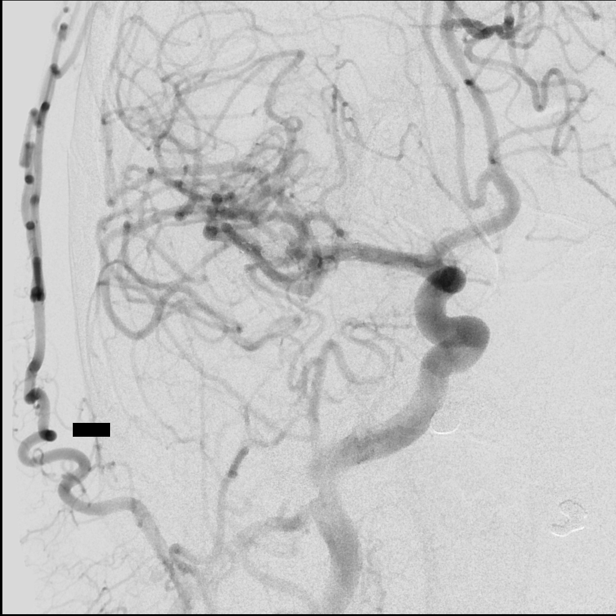
[im 49/80]
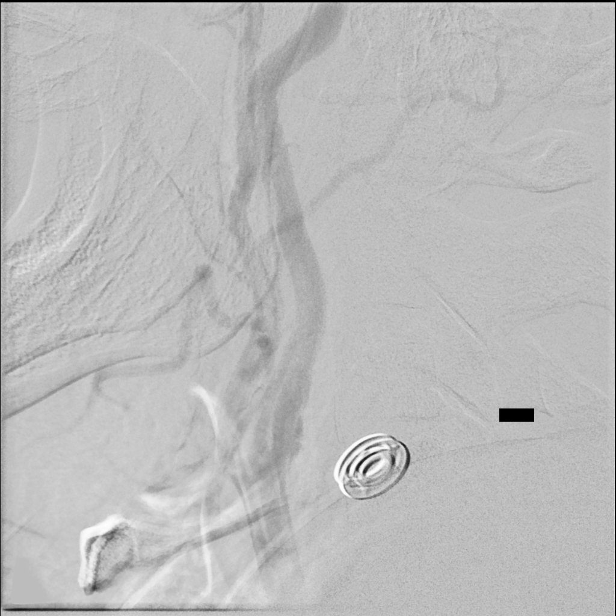
[im 55/80]
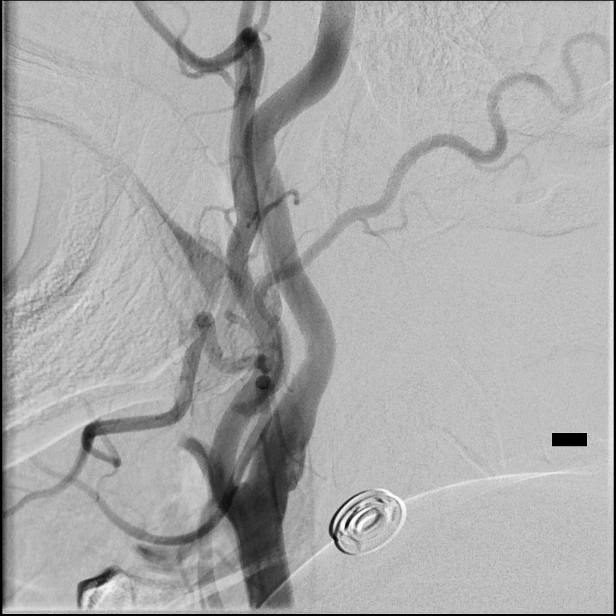
[im 62/80]
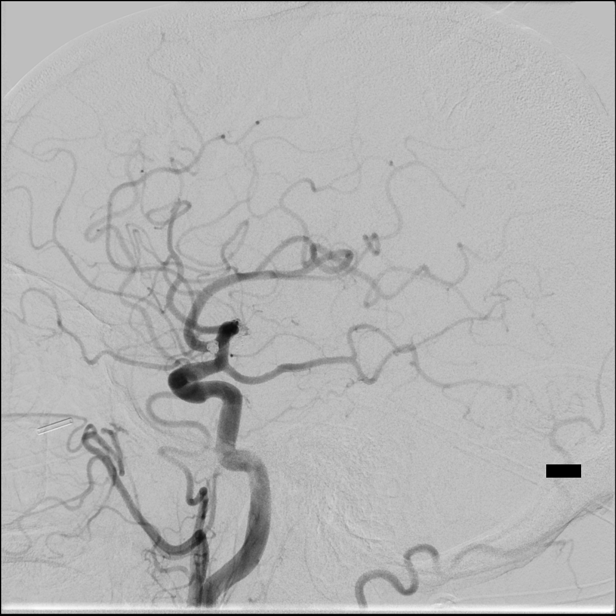
[im 66/80]
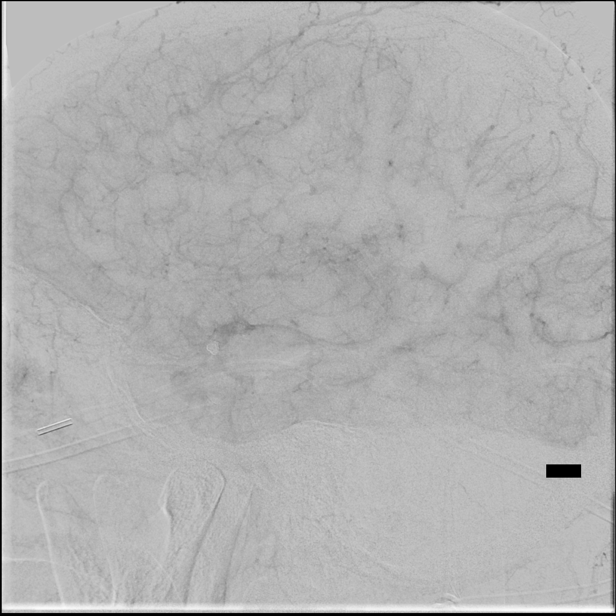
[im 73/80]
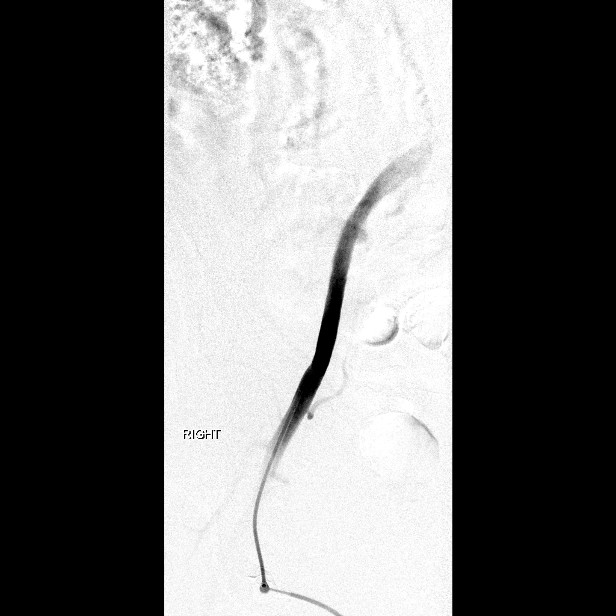
[im 80/80]
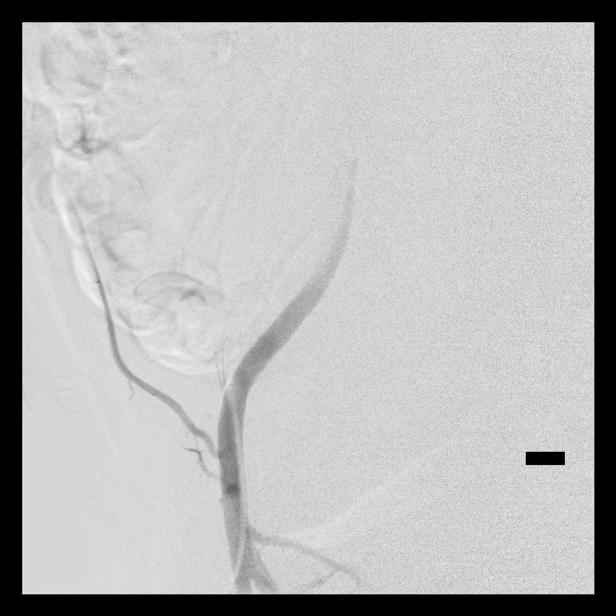

[14 of 24 positions shown; findings below may reference images not displayed]

BILATERAL COMMON CAROTID ARTERIOGRAMS

Following a full explanation of the procedure along with the
potential associated complications, an informed witnessed consent
was obtained.

The right groin was prepped and draped in the usual sterile
fashion.  Thereafter using modified Seldinger technique,
transfemoral access into the right common femoral artery was
obtained without difficulty.  Over a 0.035-inch guidewire, a 5-
French Pinnacle sheath was inserted.  Through this and also over a
0.035-inch guidewire, a 5-French JB1 catheter was advanced to the
aortic arch region and selectively positioned in the right common
carotid artery and the left common carotid artery.

There were no acute complications.  The patient tolerated the
procedure well.

Medications utilized: Versed 2 mg IV.  Fentanyl 75 mcg IV.

Contrast: Hmnipaque-RDD approximately 35 ml.
FINDINGS: The right common carotid arteriogram demonstrates the
right external carotid artery and its major branches to be normal.
The right internal carotid artery at the bulb to the cranial skull
base is normal.

The petrous, cavernous and the supraclinoid segments opacify
normally.

The right middle and the right anterior cerebral arteries opacify
normally into the capillary and venous phases.

The previously endovascularly treated right MCA trifurcation region
aneurysm is nearly completely obliterated with a less than 1 mm
neck remnant along the superior margin.

Wide patency of the previously positioned stent is seen.

The left common carotid arteriogram demonstrates the left external
carotid artery and its major branches to be normal.

The left internal carotid artery at the bulb demonstrates minimal
smooth plaque.

More distally the vessel is seen to opacify normally to the cranial
skull base.

There is normal opacification of the petrous, cavernous and the
supraclinoid segments.

A dominant left posterior communicating artery is seen opacifying
the left posterior cerebral artery distribution.

The left middle and the left anterior cerebral arteries opacify
normally into capillary and venous phases.

IMPRESSION
1.  Interval progressive occlusion of the right MCA trifurcation
aneurysm, with a minimal neck remnant as described.
2.  Wide patency of the stented segment of the right MCA.

The findings were reviewed with the patient and the patient's
family.

The patient was instructed to switch to 81 mg of aspirin per day.

A follow-up arteriogram will be undertaken in 8-9 months from
today.

## 2013-12-04 LAB — HM DIABETES EYE EXAM

## 2013-12-08 ENCOUNTER — Encounter: Payer: Self-pay | Admitting: Internal Medicine

## 2013-12-08 ENCOUNTER — Ambulatory Visit (INDEPENDENT_AMBULATORY_CARE_PROVIDER_SITE_OTHER): Payer: Medicare HMO | Admitting: Internal Medicine

## 2013-12-08 VITALS — BP 134/90 | HR 88 | Temp 98.0°F | Ht 75.0 in | Wt 248.0 lb

## 2013-12-08 DIAGNOSIS — E1169 Type 2 diabetes mellitus with other specified complication: Secondary | ICD-10-CM

## 2013-12-08 LAB — LIPID PANEL
CHOLESTEROL: 176 mg/dL (ref 0–200)
HDL: 31.5 mg/dL — AB (ref >39.00)
LDL CALC: 122 mg/dL — AB (ref 0–99)
TRIGLYCERIDES: 115 mg/dL (ref 0.0–149.0)
Total CHOL/HDL Ratio: 6
VLDL: 23 mg/dL (ref 0.0–40.0)

## 2013-12-08 LAB — MICROALBUMIN / CREATININE URINE RATIO
Creatinine,U: 244.3 mg/dL
MICROALB UR: 0.9 mg/dL (ref 0.0–1.9)
Microalb Creat Ratio: 0.4 mg/g (ref 0.0–30.0)

## 2013-12-08 LAB — HEPATIC FUNCTION PANEL
ALK PHOS: 46 U/L (ref 39–117)
ALT: 22 U/L (ref 0–53)
AST: 27 U/L (ref 0–37)
Albumin: 4.1 g/dL (ref 3.5–5.2)
BILIRUBIN DIRECT: 0.2 mg/dL (ref 0.0–0.3)
BILIRUBIN TOTAL: 1.3 mg/dL — AB (ref 0.3–1.2)
TOTAL PROTEIN: 7.9 g/dL (ref 6.0–8.3)

## 2013-12-08 LAB — BASIC METABOLIC PANEL
BUN: 10 mg/dL (ref 6–23)
CHLORIDE: 108 meq/L (ref 96–112)
CO2: 23 mEq/L (ref 19–32)
Calcium: 9.8 mg/dL (ref 8.4–10.5)
Creatinine, Ser: 1 mg/dL (ref 0.4–1.5)
GFR: 81.38 mL/min (ref >60.00)
Glucose, Bld: 129 mg/dL — ABNORMAL HIGH (ref 70–99)
POTASSIUM: 3.8 meq/L (ref 3.5–5.1)
SODIUM: 141 meq/L (ref 135–145)

## 2013-12-08 LAB — HM DIABETES FOOT EXAM

## 2013-12-08 LAB — TSH: TSH: 1.3 u[IU]/mL (ref 0.35–5.50)

## 2013-12-08 LAB — HEMOGLOBIN A1C: Hgb A1c MFr Bld: 6.3 % (ref 4.6–6.5)

## 2013-12-08 MED ORDER — METOPROLOL SUCCINATE ER 100 MG PO TB24
100.0000 mg | ORAL_TABLET | Freq: Every morning | ORAL | Status: DC
Start: 2013-12-08 — End: 2014-01-01

## 2013-12-08 NOTE — Progress Notes (Signed)
patient comes in for followup of multiple medical problems including type 2 diabetes, hyperlipidemia, hypertension. The patient does check blood sugar but not blood pressure at home. The patetient does follow an exercise and diet program. The patient denies any polyuria, polydipsia.  In the past the patient has gone to diabetic treatment center. The patient is tolerating medications  Without difficulty. The patient does admit to medication compliance.   He has lost a great deal of weight and blood sugars running in the low 100s. He self dcd metformin  The patient tells me that he is disabled (musculoskeletal complaints).  Past Medical History  Diagnosis Date  . HYPERTENSION 12/10/2007  . Diabetes mellitus     now under control  . Pacemaker   . Bradycardia   . Dysrhythmia     BRADYCARDIA REQUIRING PACEMAKER .FOLLOWED BY GREGG TAYLOR  . Sleep apnea     CPAP.NOT WEARING NOW.NEEDS REPLACEMENT PIECE  . Arthritis   . Shoulder pain     L SHOULDER. RECENT FALL  . Shortness of breath     WITH EXERTION  . Anxiety   . Bell palsy     > 30 years  . Constipation   . Cerebral aneurysm     History   Social History  . Marital Status: Married    Spouse Name: N/A    Number of Children: N/A  . Years of Education: N/A   Occupational History  . Not on file.   Social History Main Topics  . Smoking status: Former Smoker -- 1.00 packs/day for 20 years    Types: Cigarettes    Quit date: 07/05/2011  . Smokeless tobacco: Not on file  . Alcohol Use: No  . Drug Use: No  . Sexual Activity:    Other Topics Concern  . Not on file   Social History Narrative   Not smoking     Past Surgical History  Procedure Laterality Date  . Septal deviation    . Knee surgery      left; arthoscopic  . Insert / replace / remove pacemaker    . Rotator cuff surgery  02/2012  . Cardiac catheterization  07/06/11    normal  . Fracture surgery      Family History  Problem Relation Age of Onset  . Heart  attack Mother   . Heart disease Mother   . Diabetes Mother   . Heart attack Father   . Heart disease Father     Allergies  Allergen Reactions  . Losartan Potassium-Hctz Other (See Comments)     dizziness, fatigue  . Prednisone Other (See Comments)    Raises blood sugar  . Doxycycline Rash  . Lisinopril Rash    Current Outpatient Prescriptions on File Prior to Visit  Medication Sig Dispense Refill  . aspirin 81 MG tablet Take 81 mg by mouth daily.      . metoprolol succinate (TOPROL-XL) 100 MG 24 hr tablet Take 1 tablet (100 mg total) by mouth every morning.  90 tablet  3  . Probiotic Product (PROBIOTIC PO) Take 1 capsule by mouth daily.       No current facility-administered medications on file prior to visit.     patient denies chest pain, shortness of breath, orthopnea. Denies lower extremity edema, abdominal pain, change in appetite, change in bowel movements. Patient denies rashes, musculoskeletal complaints. No other specific complaints in a complete review of systems.   BP 134/90  Pulse 88  Temp(Src) 98 F (36.7 C) (  Oral)  Ht 6\' 3"  (1.905 m)  Wt 248 lb (112.492 kg)  BMI 31.00 kg/m2  well-developed well-nourished male in no acute distress. HEENT exam atraumatic, normocephalic, neck supple without jugular venous distention. Chest clear to auscultation cardiac exam S1-S2 are regular. Abdominal exam overweight with bowel sounds, soft and nontender. Extremities no edema. Neurologic exam is alert with a normal gait.

## 2013-12-08 NOTE — Progress Notes (Signed)
Pre visit review using our clinic review tool, if applicable. No additional management support is needed unless otherwise documented below in the visit note. 

## 2013-12-11 ENCOUNTER — Telehealth: Payer: Self-pay | Admitting: Internal Medicine

## 2013-12-11 DIAGNOSIS — Z950 Presence of cardiac pacemaker: Secondary | ICD-10-CM

## 2013-12-11 NOTE — Telephone Encounter (Signed)
Pt needs new referral to cardiologist for  change in insurance, [facility] Ladona Ridgelaylor

## 2013-12-12 NOTE — Telephone Encounter (Signed)
Referral order placed.

## 2014-01-01 ENCOUNTER — Ambulatory Visit (INDEPENDENT_AMBULATORY_CARE_PROVIDER_SITE_OTHER): Payer: Medicare HMO | Admitting: Internal Medicine

## 2014-01-01 ENCOUNTER — Encounter: Payer: Self-pay | Admitting: Internal Medicine

## 2014-01-01 ENCOUNTER — Telehealth: Payer: Self-pay | Admitting: Internal Medicine

## 2014-01-01 VITALS — BP 112/62 | HR 61 | Ht 75.0 in | Wt 246.5 lb

## 2014-01-01 DIAGNOSIS — I498 Other specified cardiac arrhythmias: Secondary | ICD-10-CM

## 2014-01-01 DIAGNOSIS — I10 Essential (primary) hypertension: Secondary | ICD-10-CM

## 2014-01-01 DIAGNOSIS — Z950 Presence of cardiac pacemaker: Secondary | ICD-10-CM

## 2014-01-01 DIAGNOSIS — E669 Obesity, unspecified: Secondary | ICD-10-CM | POA: Insufficient documentation

## 2014-01-01 DIAGNOSIS — Z8679 Personal history of other diseases of the circulatory system: Secondary | ICD-10-CM

## 2014-01-01 LAB — MDC_IDC_ENUM_SESS_TYPE_INCLINIC
Brady Statistic RA Percent Paced: 74 %
Date Time Interrogation Session: 20150129085218
Implantable Pulse Generator Model: 2210
Implantable Pulse Generator Serial Number: 7250770
Lead Channel Impedance Value: 437.5 Ohm
Lead Channel Pacing Threshold Amplitude: 1.125 V
Lead Channel Pacing Threshold Pulse Width: 0.4 ms
Lead Channel Pacing Threshold Pulse Width: 0.4 ms
Lead Channel Sensing Intrinsic Amplitude: 12 mV
Lead Channel Setting Pacing Pulse Width: 0.4 ms
Lead Channel Setting Sensing Sensitivity: 2 mV
MDC IDC MSMT BATTERY REMAINING LONGEVITY: 98.4 mo
MDC IDC MSMT BATTERY VOLTAGE: 2.95 V
MDC IDC MSMT LEADCHNL RA IMPEDANCE VALUE: 387.5 Ohm
MDC IDC MSMT LEADCHNL RA PACING THRESHOLD AMPLITUDE: 0.75 V
MDC IDC MSMT LEADCHNL RA SENSING INTR AMPL: 5 mV
MDC IDC SET LEADCHNL RA PACING AMPLITUDE: 2 V
MDC IDC SET LEADCHNL RV PACING AMPLITUDE: 1.375
MDC IDC STAT BRADY RV PERCENT PACED: 0.05 %

## 2014-01-01 MED ORDER — METOPROLOL SUCCINATE ER 100 MG PO TB24: 100.0000 mg | ORAL_TABLET | Freq: Every morning | ORAL | Status: DC

## 2014-01-01 NOTE — Assessment & Plan Note (Signed)
His blood pressure is normal today. He will continue his current dose of beta blocker therapy.

## 2014-01-01 NOTE — Assessment & Plan Note (Signed)
His Medtronic dual-chamber pacemaker is working normally. We'll plan to recheck in several months. 

## 2014-01-01 NOTE — Progress Notes (Signed)
HPI [name] returns today for followup. He is a pleasant 58 yo man with symptomatic bradycardia, s/p PPM, HTN, obesity and a cerebral aneurysm, status post treatment.  He denies chest pain or sob. He has started back exercising, and lost 60 pounds, and his diabetes has resolved.. No syncope.  Allergies  Allergen Reactions  . Losartan Potassium-Hctz Other (See Comments)     dizziness, fatigue  . Prednisone Other (See Comments)    Raises blood sugar  . Doxycycline Rash  . Lisinopril Rash     Current Outpatient Prescriptions  Medication Sig Dispense Refill  . aspirin 81 MG tablet Take 81 mg by mouth daily.      . metoprolol succinate (TOPROL-XL) 100 MG 24 hr tablet Take 1 tablet (100 mg total) by mouth every morning.  90 tablet  3  . Multiple Vitamin (MULTIVITAMIN) tablet Take 1 tablet by mouth daily.      . Probiotic Product (PROBIOTIC PO) Take 1 capsule by mouth daily.       No current facility-administered medications for this visit.     Past Medical History  Diagnosis Date  . HYPERTENSION 12/10/2007  . Diabetes mellitus     now under control  . Pacemaker   . Bradycardia   . Dysrhythmia     BRADYCARDIA REQUIRING PACEMAKER .FOLLOWED BY Timiyah Romito  . Sleep apnea     CPAP.NOT WEARING NOW.NEEDS REPLACEMENT PIECE  . Arthritis   . Shoulder pain     L SHOULDER. RECENT FALL  . Shortness of breath     WITH EXERTION  . Anxiety   . Bell palsy     > 30 years  . Constipation   . Cerebral aneurysm     ROS:   All systems reviewed and negative except as noted in the HPI.   Past Surgical History  Procedure Laterality Date  . Septal deviation    . Knee surgery      left; arthoscopic  . Insert / replace / remove pacemaker    . Rotator cuff surgery  02/2012  . Cardiac catheterization  07/06/11    normal  . Fracture surgery       Family History  Problem Relation Age of Onset  . Heart attack Mother   . Heart disease Mother   . Diabetes Mother   . Heart attack Father    . Heart disease Father      History   Social History  . Marital Status: Married    Spouse Name: N/A    Number of Children: N/A  . Years of Education: N/A   Occupational History  . Not on file.   Social History Main Topics  . Smoking status: Former Smoker -- 1.00 packs/day for 20 years    Types: Cigarettes    Quit date: 07/05/2011  . Smokeless tobacco: Not on file  . Alcohol Use: No  . Drug Use: No  . Sexual Activity:    Other Topics Concern  . Not on file   Social History Narrative   Not smoking      BP 112/62  Pulse 61  Ht 6\' 3"  (1.905 m)  Wt 246 lb 8 oz (111.812 kg)  BMI 30.81 kg/m2  Physical Exam:  obese appearing middle aged man, NAD HEENT: Unremarkable Neck:  No JVD, no thyromegally Lungs:  Clear with no wheezes, rales, or rhonchi. Well healed PPM incision. HEART:  Regular rate rhythm, no murmurs, no rubs, no clicks Abd:  soft, positive bowel sounds,  no organomegally, no rebound, no guarding Ext:  2 plus pulses, no edema, no cyanosis, no clubbing Skin:  No rashes no nodules Neuro:  CN II through XII intact, motor grossly intact  DEVICE  Normal device function.  See PaceArt for details.   Assess/Plan:

## 2014-01-01 NOTE — Telephone Encounter (Signed)
rx sent in electronically to [facility]

## 2014-01-01 NOTE — Patient Instructions (Signed)
Your physician wants you to follow-up in: 12 months with Dr Court JoyAylor You will receive a reminder letter in the mail two months in advance. If you don't receive a letter, please call our office to schedule the follow-up appointment.   Remote monitoring is used to monitor your Pacemaker or ICD from home. This monitoring reduces the number of office visits required to check your device to one time per year. It allows us to keep an eye on the functioning of your device to ensure it is working properly. You are scheduled for a device check from home on 04/07/14. You may send your transmission at any time that day. If you have a wireless device, the transmission will be sent automatically. After your physician reviews your transmission, you will receive a postcard with your next transmission date.

## 2014-01-01 NOTE — Telephone Encounter (Signed)
Pt request refill metoprolol succinate (TOPROL-XL) 100 MG 24 hr tablet pls send back to [facility]. Pt is now a member here.  90 tab

## 2014-01-01 NOTE — Assessment & Plan Note (Signed)
With diet and exercise, he is lost over 60 pounds. His obesity has resolved. He is going to continue his strategy of diet and exercise.

## 2014-01-02 ENCOUNTER — Telehealth: Payer: Self-pay | Admitting: Internal Medicine

## 2014-01-02 NOTE — Telephone Encounter (Signed)
Relevant patient education mailed to patient.  

## 2014-01-16 ENCOUNTER — Ambulatory Visit (INDEPENDENT_AMBULATORY_CARE_PROVIDER_SITE_OTHER): Payer: Medicare HMO | Admitting: Family Medicine

## 2014-01-16 ENCOUNTER — Encounter: Payer: Self-pay | Admitting: Family Medicine

## 2014-01-16 VITALS — BP 110/80 | HR 84 | Temp 98.9°F | Ht 75.0 in | Wt 240.0 lb

## 2014-01-16 DIAGNOSIS — J209 Acute bronchitis, unspecified: Secondary | ICD-10-CM

## 2014-01-16 MED ORDER — AZITHROMYCIN 250 MG PO TABS: ORAL_TABLET | ORAL | Status: DC

## 2014-01-16 NOTE — Progress Notes (Signed)
   Subjective:    Patient ID: Charlene BrookeCarl W Matura, male    DOB: 11/05/1956, 58 y.o.   MRN: [id number]  HPI Here for 4 days of fever, PND, ST , and coughing up green sputum. His wife has the same sx and she is improving now after she started on a Zpack.    Review of Systems  Constitutional: Positive for fever.  HENT: Positive for congestion and postnasal drip. Negative for sinus pressure.   Eyes: Negative.   Respiratory: Positive for cough and chest tightness.        Objective:   Physical Exam  Constitutional: He appears well-developed and well-nourished.  HENT:  Right Ear: External ear normal.  Left Ear: External ear normal.  Nose: Nose normal.  Mouth/Throat: Oropharynx is clear and moist.  Eyes: Conjunctivae are normal.  Pulmonary/Chest: Effort normal and breath sounds normal.  Lymphadenopathy:    He has no cervical adenopathy.          Assessment & Plan:  Add Mucinex

## 2014-01-16 NOTE — Progress Notes (Signed)
Pre visit review using our clinic review tool, if applicable. No additional management support is needed unless otherwise documented below in the visit note. 

## 2014-01-22 ENCOUNTER — Encounter: Payer: Self-pay | Admitting: Internal Medicine

## 2014-03-24 ENCOUNTER — Telehealth: Payer: Self-pay | Admitting: Internal Medicine

## 2014-03-24 DIAGNOSIS — I671 Cerebral aneurysm, nonruptured: Secondary | ICD-10-CM

## 2014-03-24 NOTE — Telephone Encounter (Addendum)
Pt needs a referral to see dr Kerby Noradeveshwar, tony phone # [phone] attn jennifer pt has [facility] . Pt needs referral to check brain aneursyms. Pt is due aug 2015

## 2014-03-30 NOTE — Telephone Encounter (Signed)
Referral order placed.

## 2014-04-02 ENCOUNTER — Telehealth: Payer: Self-pay | Admitting: Internal Medicine

## 2014-04-02 NOTE — Telephone Encounter (Signed)
Pt prefers [facility] rather than [facility]. Pt only lives 5 minutes away from office. Pt scheduled for device check in August.

## 2014-04-02 NOTE — Telephone Encounter (Signed)
New Message  Pt requests a call back to discuss why a remote pacer appt is needed. Please call back to discuss,

## 2014-05-06 ENCOUNTER — Telehealth: Payer: Self-pay | Admitting: Internal Medicine

## 2014-05-06 NOTE — Telephone Encounter (Signed)
Pt needs  [facility] referrals one to  see dr Juline Patch cardiologist. Pt has appt on 07/13/14 to check his pacemaker. Pt also needs referral for [facility]ologist dr deveshwar for cerebral angiogram pt had brain hemorrhage please fax referral  attn jennifer

## 2014-06-10 ENCOUNTER — Telehealth: Payer: Self-pay | Admitting: Internal Medicine

## 2014-06-10 ENCOUNTER — Other Ambulatory Visit (HOSPITAL_COMMUNITY): Payer: Self-pay | Admitting: Interventional Radiology

## 2014-06-10 DIAGNOSIS — I729 Aneurysm of unspecified site: Secondary | ICD-10-CM

## 2014-06-10 NOTE — Telephone Encounter (Signed)
Pt states his radiolgy appt for his referral is August 18 Pt states Victorino DikeJennifer at [facility] asked to send the referral to her desk fax at [phone]

## 2014-06-10 NOTE — Telephone Encounter (Signed)
Faxed order to jennifer at the number provided  [phone]

## 2014-07-03 ENCOUNTER — Telehealth: Payer: Self-pay

## 2014-07-03 ENCOUNTER — Other Ambulatory Visit: Payer: Self-pay | Admitting: Internal Medicine

## 2014-07-03 DIAGNOSIS — E119 Type 2 diabetes mellitus without complications: Secondary | ICD-10-CM

## 2014-07-03 NOTE — Telephone Encounter (Signed)
Called and spoke with pt during diabetic bundle.  Pt needs a lipid panel.  Pt would like to establish care with new PCP.

## 2014-07-07 ENCOUNTER — Encounter (HOSPITAL_COMMUNITY): Payer: Self-pay | Admitting: Pharmacy Technician

## 2014-07-13 ENCOUNTER — Encounter: Payer: Self-pay | Admitting: Internal Medicine

## 2014-07-13 ENCOUNTER — Ambulatory Visit (INDEPENDENT_AMBULATORY_CARE_PROVIDER_SITE_OTHER): Payer: Medicare HMO | Admitting: *Deleted

## 2014-07-13 DIAGNOSIS — I498 Other specified cardiac arrhythmias: Secondary | ICD-10-CM

## 2014-07-13 LAB — MDC_IDC_ENUM_SESS_TYPE_INCLINIC
Battery Remaining Longevity: 122.4 mo
Battery Voltage: 2.96 V
Brady Statistic RA Percent Paced: 91 %
Brady Statistic RV Percent Paced: 0.09 %
Date Time Interrogation Session: 20150810092415
Implantable Pulse Generator Serial Number: 7250770
Lead Channel Impedance Value: 400 Ohm
Lead Channel Pacing Threshold Amplitude: 0.5 V
Lead Channel Pacing Threshold Amplitude: 1.125 V
Lead Channel Pacing Threshold Pulse Width: 0.4 ms
Lead Channel Pacing Threshold Pulse Width: 0.4 ms
Lead Channel Sensing Intrinsic Amplitude: 5 mV
Lead Channel Setting Pacing Amplitude: 1.375
Lead Channel Setting Pacing Amplitude: 2 V
Lead Channel Setting Pacing Pulse Width: 0.4 ms
MDC IDC MSMT LEADCHNL RA PACING THRESHOLD AMPLITUDE: 0.5 V
MDC IDC MSMT LEADCHNL RA PACING THRESHOLD PULSEWIDTH: 0.4 ms
MDC IDC MSMT LEADCHNL RV IMPEDANCE VALUE: 512.5 Ohm
MDC IDC MSMT LEADCHNL RV SENSING INTR AMPL: 9.1 mV
MDC IDC SET LEADCHNL RV SENSING SENSITIVITY: 2 mV

## 2014-07-13 NOTE — Progress Notes (Signed)
Pacemaker check in clinic. Normal device function. Thresholds, sensing, impedances consistent with previous measurements. Device programmed to maximize longevity. 1 mode switch---282min 30sec @173bpm . No high ventricular rates noted. Device programmed at appropriate safety margins. Histogram distribution appropriate for patient activity level. Device programmed to optimize intrinsic conduction. Estimated longevity 9.9-10.7445yrs. ROV w/ Dr. Ladona Ridgelaylor Jan/2016.

## 2014-07-17 ENCOUNTER — Other Ambulatory Visit: Payer: Self-pay | Admitting: Radiology

## 2014-07-20 ENCOUNTER — Other Ambulatory Visit: Payer: Self-pay | Admitting: Radiology

## 2014-07-21 ENCOUNTER — Ambulatory Visit (HOSPITAL_COMMUNITY)
Admission: RE | Admit: 2014-07-21 | Discharge: 2014-07-21 | Disposition: A | Discharge status: Home / Self Care | Payer: Medicare HMO | Source: Ambulatory Visit | Attending: Interventional Radiology | Admitting: Interventional Radiology

## 2014-07-21 ENCOUNTER — Encounter (HOSPITAL_COMMUNITY): Payer: Self-pay

## 2014-07-21 ENCOUNTER — Other Ambulatory Visit (HOSPITAL_COMMUNITY): Payer: Self-pay | Admitting: Interventional Radiology

## 2014-07-21 DIAGNOSIS — I729 Aneurysm of unspecified site: Secondary | ICD-10-CM

## 2014-07-21 DIAGNOSIS — I10 Essential (primary) hypertension: Secondary | ICD-10-CM | POA: Diagnosis not present

## 2014-07-21 DIAGNOSIS — Z48812 Encounter for surgical aftercare following surgery on the circulatory system: Secondary | ICD-10-CM | POA: Diagnosis not present

## 2014-07-21 LAB — PROTIME-INR
INR: 1.12 (ref 0.00–1.49)
PROTHROMBIN TIME: 14.4 s (ref 11.6–15.2)

## 2014-07-21 LAB — CBC
HCT: 46.8 % (ref 39.0–52.0)
HEMOGLOBIN: 16.3 g/dL (ref 13.0–17.0)
MCH: 31.2 pg (ref 26.0–34.0)
MCHC: 34.8 g/dL (ref 30.0–36.0)
MCV: 89.7 fL (ref 78.0–100.0)
PLATELETS: 224 10*3/uL (ref 150–400)
RBC: 5.22 MIL/uL (ref 4.22–5.81)
RDW: 13 % (ref 11.5–15.5)
WBC: 5.9 10*3/uL (ref 4.0–10.5)

## 2014-07-21 LAB — BASIC METABOLIC PANEL
ANION GAP: 12 (ref 5–15)
BUN: 9 mg/dL (ref 6–23)
CHLORIDE: 106 meq/L (ref 96–112)
CO2: 24 mEq/L (ref 19–32)
CREATININE: 0.98 mg/dL (ref 0.50–1.35)
Calcium: 9.6 mg/dL (ref 8.4–10.5)
GFR, EST NON AFRICAN AMERICAN: 88 mL/min — AB (ref >90)
Glucose, Bld: 112 mg/dL — ABNORMAL HIGH (ref 70–99)
Potassium: 4.1 mEq/L (ref 3.7–5.3)
Sodium: 142 mEq/L (ref 137–147)

## 2014-07-21 MED ORDER — FENTANYL CITRATE 0.05 MG/ML IJ SOLN
INTRAMUSCULAR | Status: AC | PRN
  Administered 2014-07-21: 12.5 ug via INTRAVENOUS
  Administered 2014-07-21: 25 ug via INTRAVENOUS

## 2014-07-21 MED ORDER — MIDAZOLAM HCL 2 MG/2ML IJ SOLN
INTRAMUSCULAR | Status: AC | PRN
  Administered 2014-07-21: 1 mg via INTRAVENOUS
  Administered 2014-07-21: 0.5 mg via INTRAVENOUS

## 2014-07-21 MED ORDER — LIDOCAINE HCL 1 % IJ SOLN
INTRAMUSCULAR | Status: AC
  Filled 2014-07-21: qty 20

## 2014-07-21 MED ORDER — HEPARIN SODIUM (PORCINE) 1000 UNIT/ML IJ SOLN
INTRAMUSCULAR | Status: AC
  Filled 2014-07-21: qty 1

## 2014-07-21 MED ORDER — FENTANYL CITRATE 0.05 MG/ML IJ SOLN
INTRAMUSCULAR | Status: AC
  Filled 2014-07-21: qty 4

## 2014-07-21 MED ORDER — SODIUM CHLORIDE 0.9 % IV SOLN
INTRAVENOUS | Status: DC
Start: 2014-07-21 — End: 2014-07-22
  Administered 2014-07-21: 07:00:00 via INTRAVENOUS

## 2014-07-21 MED ORDER — MIDAZOLAM HCL 2 MG/2ML IJ SOLN
INTRAMUSCULAR | Status: AC
  Filled 2014-07-21: qty 4

## 2014-07-21 MED ORDER — HEPARIN SOD (PORK) LOCK FLUSH 100 UNIT/ML IV SOLN
INTRAVENOUS | Status: AC | PRN
  Administered 2014-07-21: 1000 [IU] via INTRAVENOUS

## 2014-07-21 MED ORDER — SODIUM CHLORIDE 0.9 % IV SOLN: INTRAVENOUS | Status: AC

## 2014-07-21 MED ORDER — IOHEXOL 300 MG/ML  SOLN
100.0000 mL | Freq: Once | INTRAMUSCULAR | Status: AC | PRN
  Administered 2014-07-21: 60 mL via INTRAVENOUS

## 2014-07-21 NOTE — Sedation Documentation (Signed)
Right pedal pulse and tibial pulse +3.

## 2014-07-21 NOTE — Procedures (Signed)
S/P bilateral common carotid arteriograms. RT CFA approach. Findings. 1.Obliterated RT MCA trifurcation aneurysm..Marland Kitchen

## 2014-07-21 NOTE — Sedation Documentation (Signed)
Right groin exosealed.

## 2014-07-21 NOTE — Discharge Instructions (Signed)

## 2014-07-21 NOTE — Sedation Documentation (Signed)
Plus 3 pulses bilateral DP and PT

## 2014-07-21 NOTE — H&P (Signed)
Paul Paul is an 59 y.o. male.   Chief Complaint: Pt with previous stent/coiling of R Middle Cerebral Artery aneurysm Initial intervention 07/2012 Recheck 12/2012 revealed stable embolization with very small neck remnant- pt asymptomatic Cerebral arteriogram 07/2013 continues to show stable aneurysm and stable neck remnant. Now scheduled today for follow up cerebral arteriogram +pacemaker--cannot have MRI/MRA Pt has had intentional wt loss of 70 lbs in 1 yr.  Doing well. Denies any neurologic symptoms  HPI: HTN; OSA  Past Medical History  Diagnosis Date  . HYPERTENSION 12/10/2007  . Diabetes mellitus     now under control  . Pacemaker   . Bradycardia   . Dysrhythmia     BRADYCARDIA REQUIRING PACEMAKER .FOLLOWED BY GREGG TAYLOR  . Sleep apnea     CPAP.NOT WEARING NOW.NEEDS REPLACEMENT PIECE  . Arthritis   . Shoulder pain     L SHOULDER. RECENT FALL  . Shortness of breath     WITH EXERTION  . Anxiety   . Bell palsy     > 30 years  . Constipation   . Cerebral aneurysm     Past Surgical History  Procedure Laterality Date  . Septal deviation    . Knee surgery      left; arthoscopic  . Insert / replace / remove pacemaker    . Rotator cuff surgery  02/2012  . Cardiac catheterization  07/06/11    normal  . Fracture surgery      Family History  Problem Relation Age of Onset  . Heart attack Mother   . Heart disease Mother   . Diabetes Mother   . Heart attack Father   . Heart disease Father    Social History:  reports that he quit smoking about 3 years ago. His smoking use included Cigarettes. He has a 20 pack-year smoking history. He has never used smokeless tobacco. He reports that he does not drink alcohol or use illicit drugs.  Allergies:  Allergies  Allergen Reactions  . Losartan Potassium-Hctz Other (See Comments)     dizziness, fatigue  . Prednisone Other (See Comments)    Raises blood sugar  . Doxycycline Rash  . Lisinopril Rash     (Not in a hospital  admission)  Results for orders placed during the hospital encounter of 07/21/14 (from the past 48 hour(s))  BASIC METABOLIC PANEL     Status: Abnormal   Collection Time    07/21/14  6:20 AM      Result Value Ref Range   Sodium 142  137 - 147 mEq/L   Potassium 4.1  3.7 - 5.3 mEq/L   Chloride 106  96 - 112 mEq/L   CO2 24  19 - 32 mEq/L   Glucose, Bld 112 (*) 70 - 99 mg/dL   BUN 9  6 - 23 mg/dL   Creatinine, Ser 0.98  0.50 - 1.35 mg/dL   Calcium 9.6  8.4 - 10.5 mg/dL   GFR calc non Af Amer 88 (*) >90 mL/min   GFR calc Af Amer >90  >90 mL/min   Comment: (NOTE)     The eGFR has been calculated using the CKD EPI equation.     This calculation has not been validated in all clinical situations.     eGFR's persistently <90 mL/min signify possible Chronic Kidney     Disease.   Anion gap 12  5 - 15  CBC     Status: None   Collection Time    07/21/14  6:20 AM      Result Value Ref Range   WBC 5.9  4.0 - 10.5 K/uL   RBC 5.22  4.22 - 5.81 MIL/uL   Hemoglobin 16.3  13.0 - 17.0 g/dL   HCT 46.8  39.0 - 52.0 %   MCV 89.7  78.0 - 100.0 fL   MCH 31.2  26.0 - 34.0 pg   MCHC 34.8  30.0 - 36.0 g/dL   RDW 13.0  11.5 - 15.5 %   Platelets 224  150 - 400 K/uL  PROTIME-INR     Status: None   Collection Time    07/21/14  6:20 AM      Result Value Ref Range   Prothrombin Time 14.4  11.6 - 15.2 seconds   INR 1.12  0.00 - 1.49   No results found.  Review of Systems  Constitutional: Positive for weight loss. Negative for fever.       Intentional wt loss  Respiratory: Negative for shortness of breath.   Cardiovascular: Negative for chest pain.  Gastrointestinal: Negative for nausea, vomiting and abdominal pain.  Musculoskeletal: Negative for back pain.  Neurological: Negative for dizziness, tingling, tremors, sensory change, speech change, focal weakness, seizures, weakness and headaches.  Psychiatric/Behavioral: Negative for hallucinations. The patient is not nervous/anxious.     Blood  pressure 132/82, pulse 59, temperature 97.8 F (36.6 C), temperature source Oral, resp. rate 18, height '6\' 3"'  (1.905 m), weight 110.678 kg (244 lb), SpO2 98.00%. Physical Exam  Constitutional: He is oriented to person, place, and time. He appears well-nourished.  Cardiovascular: Normal rate and regular rhythm.   No murmur heard. Respiratory: Effort normal and breath sounds normal. He has no wheezes.  GI: Soft. Bowel sounds are normal. There is no tenderness.  Musculoskeletal: Normal range of motion.  Neurological: He is alert and oriented to person, place, and time.  Skin: Skin is warm and dry.  Psychiatric: He has a normal mood and affect. His behavior is normal. Judgment and thought content normal.     Assessment/Plan R MCA aneurysm Stent/coil 07/2012 Small neck remnant- stable Scheduled now for follow up cerebral arteriogram Pt aware of procedure benefits and risks and agreeable to proceed Consent signed and in chart  Banks A 07/21/2014, 7:42 AM

## 2014-08-03 ENCOUNTER — Encounter: Payer: Self-pay | Admitting: Family Medicine

## 2014-08-03 ENCOUNTER — Ambulatory Visit (INDEPENDENT_AMBULATORY_CARE_PROVIDER_SITE_OTHER): Payer: Medicare HMO | Admitting: Family Medicine

## 2014-08-03 VITALS — BP 115/74 | HR 73 | Temp 98.0°F | Wt 250.0 lb

## 2014-08-03 DIAGNOSIS — J069 Acute upper respiratory infection, unspecified: Secondary | ICD-10-CM

## 2014-08-03 NOTE — Progress Notes (Signed)
  Tana Conch, MD Phone: [phone]  Subjective:   Paul Paul is a 59 y.o. year old very pleasant male patient who presents with the following:  Cough/congestion Congestion and cough started last Friday. Mild drainage in back of throat and some nasal discharge. Symptoms worsening through today. Has tendency to develop bronchitis so wanted to seek care early. Not sure if he has had any sick contacts. Has tried some nyquil with mild relief.  ROS- no fever/chills/nausea/vomiting. No calf pain or tenderness. No shortness of breath or chest pain  Past Medical History- obesity, s/p pacemaker, history cerebral aneurysm, DM II (diet controlled since dropping over 50 lbs), HTN  Medications- reviewed and updated Current Outpatient Prescriptions  Medication Sig Dispense Refill  . aspirin EC 81 MG tablet Take 81 mg by mouth daily.      . metoprolol succinate (TOPROL-XL) 100 MG 24 hr tablet Take 1 tablet (100 mg total) by mouth every morning.  90 tablet  3  . Multiple Vitamin (MULTIVITAMIN) tablet Take 1 tablet by mouth daily.       No current facility-administered medications for this visit.   Objective: BP 115/74  Pulse 73  Temp(Src) 98 F (36.7 C)  Wt 250 lb (113.399 kg) Gen: NAD, resting comfortably HEENT: turbinates erythematous and swollen with yellow discharge, oropharynx normal without pharyngeal exudate but cobblestoning/drainage noted, TM normal bilaterally but with cerumen, Mucous membranes are moist. CV: RRR no murmurs rubs or gallops Lungs: CTAB no crackles, wheeze, rhonchi Ext: no edema, no calf tenderness Skin: warm, dry Neuro: grossly normal, moves all extremities  Assessment/Plan:  Upper Respiratory Infection Advised of symptomatic care (see AVS). Discussed difficult to tease out bronchitis vs. URI early but suspect this is URI given clear lung exam. Regardless, antibiotics not indicated as both primarily viral. Offered cough syrup but patient would prefer to just  use nyquil.  Doubt influenza as afebrile.  Given reasons for return

## 2014-08-03 NOTE — Patient Instructions (Signed)
Upper respiratory infection vs. Early bronchitis  Doubt bronchitis but possible, time course will tell  Colds last up to 2 weeks, bronchitis usually a month or so  Symptomatic treatment-nyquil, neti pot or nasal rinse can help   Reasons for return: fever, shortness of breath, worsening of symptoms after day 7, chest pain, persistence of symptoms beyond 2 weeks.

## 2014-08-17 ENCOUNTER — Ambulatory Visit (INDEPENDENT_AMBULATORY_CARE_PROVIDER_SITE_OTHER): Payer: Commercial Managed Care - HMO | Admitting: Physician Assistant

## 2014-08-17 ENCOUNTER — Encounter: Payer: Self-pay | Admitting: Physician Assistant

## 2014-08-17 VITALS — BP 128/80 | HR 84 | Temp 98.4°F | Resp 18 | Ht 75.0 in | Wt 251.3 lb

## 2014-08-17 DIAGNOSIS — L03818 Cellulitis of other sites: Secondary | ICD-10-CM

## 2014-08-17 DIAGNOSIS — L02818 Cutaneous abscess of other sites: Secondary | ICD-10-CM

## 2014-08-17 MED ORDER — SULFAMETHOXAZOLE-TMP DS 800-160 MG PO TABS: 1.0000 | ORAL_TABLET | Freq: Two times a day (BID) | ORAL | Status: DC

## 2014-08-17 NOTE — Progress Notes (Signed)
Subjective:    Patient ID: Paul Paul, male    DOB: 1956/05/05, 58 y.o.   MRN: [id number]  Penis Pain The patient's primary symptoms include genital lesions (big red swollen spot with a central point) and penile pain. The patient's pertinent negatives include no genital injury, genital itching, pelvic pain, penile discharge, priapism, scrotal swelling or testicular pain. This is a new problem. The current episode started in the past 7 days (4 days). The problem occurs constantly. The problem has been rapidly improving. The pain is severe. Pertinent negatives include no abdominal pain, anorexia, chest pain, chills, constipation, coughing, diarrhea, discolored urine, dysuria, fever, flank pain, frequency, headaches, hematuria, joint pain, joint swelling, nausea, painful intercourse (has not tried), rash, shortness of breath, sore throat, urgency, urinary retention or vomiting. The symptoms are aggravated by tactile pressure (certain movments). He has tried nothing for the symptoms. His sexual activity is non-contributory to the current illness. There is no history of BPH, chlamydia, cryptorchidism, a femoral hernia, gonorrhea, herpes simplex, an inguinal hernia, kidney stones or prostatitis.   Patient is a 58 y.o. male presenting for penile swelling.     Review of Systems  Constitutional: Negative for fever and chills.  HENT: Negative for sore throat.   Respiratory: Negative for cough and shortness of breath.   Cardiovascular: Negative for chest pain.  Gastrointestinal: Negative for nausea, vomiting, abdominal pain, diarrhea, constipation and anorexia.  Genitourinary: Positive for penile swelling and penile pain. Negative for dysuria, urgency, frequency, hematuria, flank pain, discharge, scrotal swelling, testicular pain and pelvic pain.  Musculoskeletal: Negative for gait problem, joint pain and joint swelling.  Skin: Negative for rash.  Neurological: Negative for syncope and headaches.  All  other systems reviewed and are negative.    Past Medical History  Diagnosis Date  . HYPERTENSION 12/10/2007  . Diabetes mellitus     now under control  . Pacemaker   . Bradycardia   . Dysrhythmia     BRADYCARDIA REQUIRING PACEMAKER .FOLLOWED BY GREGG TAYLOR  . Sleep apnea     CPAP.NOT WEARING NOW.NEEDS REPLACEMENT PIECE  . Arthritis   . Shoulder pain     L SHOULDER. RECENT FALL  . Shortness of breath     WITH EXERTION  . Anxiety   . Bell palsy     > 30 years  . Constipation   . Cerebral aneurysm     History   Social History  . Marital Status: Married    Spouse Name: N/A    Number of Children: N/A  . Years of Education: N/A   Occupational History  . Not on file.   Social History Main Topics  . Smoking status: Former Smoker -- 1.00 packs/day for 20 years    Types: Cigarettes    Quit date: 07/05/2011  . Smokeless tobacco: Never Used  . Alcohol Use: No  . Drug Use: No  . Sexual Activity: Not on file   Other Topics Concern  . Not on file   Social History Narrative   Not smoking     Past Surgical History  Procedure Laterality Date  . Septal deviation    . Knee surgery      left; arthoscopic  . Insert / replace / remove pacemaker    . Rotator cuff surgery  02/2012  . Cardiac catheterization  07/06/11    normal  . Fracture surgery      Family History  Problem Relation Age of Onset  . Heart attack Mother   .  Heart disease Mother   . Diabetes Mother   . Heart attack Father   . Heart disease Father     Allergies  Allergen Reactions  . Losartan Potassium-Hctz Other (See Comments)     dizziness, fatigue  . Prednisone Other (See Comments)    Raises blood sugar  . Doxycycline Rash  . Lisinopril Rash    Current Outpatient Prescriptions on File Prior to Visit  Medication Sig Dispense Refill  . aspirin EC 81 MG tablet Take 81 mg by mouth daily.      . metoprolol succinate (TOPROL-XL) 100 MG 24 hr tablet Take 1 tablet (100 mg total) by mouth every  morning.  90 tablet  3  . Multiple Vitamin (MULTIVITAMIN) tablet Take 1 tablet by mouth daily.       No current facility-administered medications on file prior to visit.    EXAM: BP 128/80  Pulse 84  Temp(Src) 98.4 F (36.9 C) (Oral)  Resp 18  Ht  (1.905 m)  Wt 251 lb 4.8 oz (113.989 kg)  BMI 31.41 kg/m2  SpO2 98%     Objective:   Physical Exam  Nursing note and vitals reviewed. Constitutional: He is oriented to person, place, and time. He appears well-developed and well-nourished. No distress.  HENT:  Head: Normocephalic and atraumatic.  Eyes: Conjunctivae and EOM are normal.  Cardiovascular: Normal rate, regular rhythm and intact distal pulses.   Pulmonary/Chest: Effort normal and breath sounds normal. No respiratory distress. He exhibits no tenderness.  Genitourinary:  There is a 2 cm diameter area of erythema and swelling on the underside of the penis near the glans. This is minimally ttp, and does not appear to be fluctuant.  There is no drainage or discharge.  Musculoskeletal:  Gait Normal.  Neurological: He is alert and oriented to person, place, and time.  Skin: Skin is warm and dry. No rash noted. He is not diaphoretic. There is erythema. No pallor.  Psychiatric: He has a normal mood and affect. His behavior is normal. Judgment and thought content normal.     Lab Results  Component Value Date   WBC 5.9 07/21/2014   HGB 16.3 07/21/2014   HCT 46.8 07/21/2014   PLT 224 07/21/2014   GLUCOSE 112* 07/21/2014   CHOL 176 12/08/2013   TRIG 115.0 12/08/2013   HDL 31.50* 12/08/2013   LDLCALC 122* 12/08/2013   ALT 22 12/08/2013   AST 27 12/08/2013   NA 142 07/21/2014   K 4.1 07/21/2014   CL 106 07/21/2014   CREATININE 0.98 07/21/2014   BUN 9 07/21/2014   CO2 24 07/21/2014   TSH 1.30 12/08/2013   PSA 0.40 11/01/2012   INR 1.12 07/21/2014   HGBA1C 6.3 12/08/2013   MICROALBUR 0.9 12/08/2013        Assessment & Plan:  Paul Paul was seen today for groin swelling.  Diagnoses and  associated orders for this visit:  Cellulitis of other specified site Comments: Cover with Bactrim DS. watchful waiting. Close follow up if no improvement.  - sulfamethoxazole-trimethoprim (BACTRIM DS) 800-160 MG per tablet; Take 1 tablet by mouth 2 (two) times daily.    Consulted Dr. Caryl Never regarding this pt, and I agree with him that this most likely represents a local cellulitis of the penile shaft. We will initially treat this with Bactrim DS, and have the patient followup closely as needed if symptoms are not improving with treatment. Patient is amenable to this plan the  Return precautions provided, and patient handout  on cellulitis.  Plan to follow up as needed, or for worsening or persistent symptoms despite treatment.  Patient Instructions  Bactrim DS Twice daily for 10 days to treat cellulitis.  If emergency symptoms discussed during visit developed, seek medical attention immediately.  Followup as needed, or for worsening or persistent symptoms despite treatment.

## 2014-08-17 NOTE — Progress Notes (Signed)
Pre visit review using our clinic review tool, if applicable. No additional management support is needed unless otherwise documented below in the visit note. 

## 2014-08-17 NOTE — Patient Instructions (Addendum)
Bactrim DS Twice daily for 10 days to treat cellulitis.  If emergency symptoms discussed during visit developed, seek medical attention immediately.  Followup as needed, or for worsening or persistent symptoms despite treatment.    Cellulitis Cellulitis is an infection of the skin and the tissue under the skin. The infected area is usually red and tender. This happens most often in the arms and lower legs. HOME CARE   Take your antibiotic medicine as told. Finish the medicine even if you start to feel better.  Keep the infected arm or leg raised (elevated).  Put a warm cloth on the area up to 4 times per day.  Only take medicines as told by your doctor.  Keep all doctor visits as told. GET HELP IF:  You see red streaks on the skin coming from the infected area.  Your red area gets bigger or turns a dark color.  Your bone or joint under the infected area is painful after the skin heals.  Your infection comes back in the same area or different area.  You have a puffy (swollen) bump in the infected area.  You have new symptoms.  You have a fever. GET HELP RIGHT AWAY IF:   You feel very sleepy.  You throw up (vomit) or have watery poop (diarrhea).  You feel sick and have muscle aches and pains. MAKE SURE YOU:   Understand these instructions.  Will watch your condition.  Will get help right away if you are not doing well or get worse. Document Released: 05/08/2008 Document Revised: 04/06/2014 Document Reviewed: 02/05/2012 [facility] Patient Information 2015 [place], [place]. This information is not intended to replace advice given to you by your health care provider. Make sure you discuss any questions you have with your health care provider.

## 2014-09-01 ENCOUNTER — Telehealth: Payer: Self-pay | Admitting: Internal Medicine

## 2014-09-01 NOTE — Telephone Encounter (Signed)
Discussed with Dr Ladona Ridgelaylor, may proceed with eye surgery

## 2014-09-01 NOTE — Telephone Encounter (Signed)
New message    Patient calling can he be put under outpatient anesthesiology for upcoming  eye surgery . H/o pacer maker.

## 2014-09-02 ENCOUNTER — Telehealth: Payer: Self-pay | Admitting: Internal Medicine

## 2014-09-02 NOTE — Telephone Encounter (Addendum)
received a call from [employer] stated this dr is not in network informed pt of this , he will call and see who's in his network and call back   Referral completed threw [employer] pending authorization  Suspended status - [id number] authorization for pt  To see Dr. Mahlon GammonLarry P. Jenkins, MD  Address: [address], [place], [place] [address]  [phone]

## 2014-09-02 NOTE — Telephone Encounter (Addendum)
Type of Insurance: [employer]  copy of Insurance Card on File? yes  Patient's PCP:dr swords  PCP's ZHY:[id number]  Treating Provider:dr jenkins  Treating Provider's NPI: [id number]  Treating Provider's Phone Number:[phone]  Treating Provider's Fax [phone]  Reason for Visit (diagnosis): complete eye exams also cataracts pt has appt on 09-15-14

## 2014-09-08 ENCOUNTER — Telehealth: Payer: Self-pay | Admitting: Internal Medicine

## 2014-09-08 NOTE — Telephone Encounter (Signed)
Pt needs referral for ov [facility] [address] [place], [phone]. ICD z0100  Appt is 09/22/14 at 9:30. Fax is [phone]

## 2014-09-09 NOTE — Telephone Encounter (Signed)
done Authorization # [id number] start 09/22/2014 - 03/21/2015 [facility] dr Sammuel Hinesbowan  Ophthalmologist  Address: [address], [place], [place] [address]  [phone]

## 2014-09-24 ENCOUNTER — Encounter: Payer: Self-pay | Admitting: Family Medicine

## 2014-09-24 ENCOUNTER — Ambulatory Visit (INDEPENDENT_AMBULATORY_CARE_PROVIDER_SITE_OTHER): Payer: Commercial Managed Care - HMO | Admitting: Family Medicine

## 2014-09-24 VITALS — BP 120/68 | Temp 98.8°F | Ht 75.0 in | Wt 254.0 lb

## 2014-09-24 DIAGNOSIS — E785 Hyperlipidemia, unspecified: Secondary | ICD-10-CM

## 2014-09-24 DIAGNOSIS — Z9989 Dependence on other enabling machines and devices: Secondary | ICD-10-CM

## 2014-09-24 DIAGNOSIS — E119 Type 2 diabetes mellitus without complications: Secondary | ICD-10-CM

## 2014-09-24 DIAGNOSIS — Z23 Encounter for immunization: Secondary | ICD-10-CM

## 2014-09-24 DIAGNOSIS — G4733 Obstructive sleep apnea (adult) (pediatric): Secondary | ICD-10-CM | POA: Insufficient documentation

## 2014-09-24 DIAGNOSIS — I10 Essential (primary) hypertension: Secondary | ICD-10-CM

## 2014-09-24 DIAGNOSIS — E1169 Type 2 diabetes mellitus with other specified complication: Secondary | ICD-10-CM | POA: Insufficient documentation

## 2014-09-24 NOTE — Progress Notes (Signed)
Pre visit review using our clinic review tool, if applicable. No additional management support is needed unless otherwise documented below in the visit note. 

## 2014-09-24 NOTE — Progress Notes (Signed)
Tana ConchStephen Nethan Caudillo, MD Phone: [phone]  Subjective:  Patient presents today to establish care with me as their new primary care provider. Patient was formerly a patient of Dr. Cato MulliganSwords. Chief complaint-noted.   Diabetes mellitus-well-controlled with diet and exercise Lab Results  Component Value Date   HGBA1C 6.3 12/08/2013   checks AM CBGs and mainly under 120 fasting sugars ROS-no hypoglycemia  Hyperlipidemia-mild poor control Lab Results  Component Value Date   LDLCALC 122* 12/08/2013  On statin: No Regular exercise: Yes Diet: Monitors closely ROS- no chest pain or shortness of breath. No myalgias  Hypertension-well-controlled on metoprolol BP Readings from Last 3 Encounters:  09/24/14 120/68  08/17/14 128/80  08/03/14 115/74  ROS-Denies any CP, HA, SOB, blurry vision, LE edema  The following were reviewed and entered/updated in epic: Past Medical History  Diagnosis Date  . HYPERTENSION 12/10/2007  . Diabetes mellitus     now under control  . Pacemaker   . Bradycardia   . Dysrhythmia     BRADYCARDIA REQUIRING PACEMAKER .FOLLOWED BY GREGG TAYLOR  . Sleep apnea     CPAP.NOT WEARING NOW.NEEDS REPLACEMENT PIECE  . Arthritis   . Shoulder pain     L SHOULDER. RECENT FALL  . Shortness of breath     WITH EXERTION  . Anxiety   . Bell palsy     > 30 years  . Constipation   . Cerebral aneurysm    Patient Active Problem List   Diagnosis Date Noted  . Cerebral aneurysm without rupture 01/01/2012    Priority: High  . History of pacemaker 10/07/2011    Priority: High  . Well controlled type 2 diabetes mellitus 11/09/2008    Priority: High  . OSA on CPAP 09/24/2014    Priority: Medium  . Hyperlipidemia 09/24/2014    Priority: Medium  . Other specified cardiac dysrhythmias(427.89) 07/09/2012    Priority: Medium  . Essential hypertension 12/10/2007    Priority: Medium  . Obesity 01/01/2014    Priority: Low   Past Surgical History  Procedure Laterality Date  .  Septal deviation    . Knee surgery      left; arthoscopic  . Insert / replace / remove pacemaker    . Rotator cuff surgery  02/2012  . Cardiac catheterization  07/06/11    normal  . Fracture surgery      left foot    Family History  Problem Relation Age of Onset  . Heart attack Mother     died 2354  . Heart disease Mother   . Diabetes Mother   . Heart attack Father     died 1754  . Heart disease Father     Medications- reviewed and updated Current Outpatient Prescriptions  Medication Sig Dispense Refill  . aspirin EC 81 MG tablet Take 81 mg by mouth daily.      . metoprolol succinate (TOPROL-XL) 100 MG 24 hr tablet Take 1 tablet (100 mg total) by mouth every morning.  90 tablet  3  . Multiple Vitamin (MULTIVITAMIN) tablet Take 1 tablet by mouth daily.       No current facility-administered medications for this visit.    Allergies-reviewed and updated Allergies  Allergen Reactions  . Losartan Potassium-Hctz Other (See Comments)     dizziness, fatigue  . Prednisone Other (See Comments)    Raises blood sugar  . Doxycycline Rash  . Lisinopril Rash    History   Social History  . Marital Status: Married    Spouse  Name: N/A    Number of Children: N/A  . Years of Education: N/A   Social History Main Topics  . Smoking status: Former Smoker -- 1.00 packs/day for 20 years    Types: Cigarettes    Quit date: 07/05/2011  . Smokeless tobacco: Never Used  . Alcohol Use: No  . Drug Use: No  . Sexual Activity: None   Other Topics Concern  . None   Social History Narrative   Married over 30 years in 2015. 1 step son. 1 grandson.       Disabled from brain aneurysm, pacemaker. Retired from [employer] business-37 years.       Hobbies: grandson, exercise at [employer] [facility] [employer]    ROS--See HPI   Objective: BP 120/68  Temp(Src) 98.8 F (37.1 C) (Oral)  Ht 6\' 3"  (1.905 m)  Wt 254 lb (115.214 kg)  BMI 31.75 kg/m2 Gen: NAD, resting comfortably in chair, moves easily to table CV: RRR no  murmurs rubs or gallops Lungs: CTAB no crackles, wheeze, rhonchi Abdomen: soft/nontender/nondistended/normal bowel sounds.  Ext: no edema Skin: warm, dry Neuro: grossly normal, moves all extremities, PERRLA   Assessment/Plan:  Well controlled type 2 diabetes mellitus Well-controlled with diet exercise. Recheck A1c was labs at physical in December  Hyperlipidemia 10% 10 year risk if taking out DM. Patient does already have some short term memory issues after pacemaker and aneurysm and he is worried about taking a statin. We'll recalculate risk with next labs and we discussed perhaps using a 15% cut off for starting statin.  Essential hypertension Continue metoprolol 100 mg as well controlled   Told patient preventative care services should be covered. If for some reason flu shot came back not covered, would talk to [place] about charge. He had plan to wait to physical but discussed this may put him at increased risk influenza Orders Placed This Encounter  Procedures  . Flu Vaccine QUAD 36+ mos PF IM (Fluarix Quad PF)

## 2014-09-24 NOTE — Assessment & Plan Note (Signed)
Well-controlled with diet exercise. Recheck A1c was labs at physical in December

## 2014-09-24 NOTE — Assessment & Plan Note (Signed)
10% 10 year risk if taking out DM. Patient does already have some short term memory issues after pacemaker and aneurysm and he is worried about taking a statin. We'll recalculate risk with next labs and we discussed perhaps using a 15% cut off for starting statin.

## 2014-09-24 NOTE — Assessment & Plan Note (Signed)
Continue metoprolol 100 mg as well controlled

## 2014-09-24 NOTE — Patient Instructions (Addendum)
Great to see you today and get to go through your history.   December for physical. May be 3rd or 4th 30 minute appointment for the AM. Patient to come in fasting.   Flu shot today

## 2014-11-04 ENCOUNTER — Encounter: Payer: Self-pay | Admitting: Family Medicine

## 2014-11-04 ENCOUNTER — Ambulatory Visit (INDEPENDENT_AMBULATORY_CARE_PROVIDER_SITE_OTHER): Payer: Medicare HMO | Admitting: Family Medicine

## 2014-11-04 VITALS — BP 127/78 | HR 75 | Temp 98.4°F | Ht 75.0 in | Wt 260.0 lb

## 2014-11-04 DIAGNOSIS — J209 Acute bronchitis, unspecified: Secondary | ICD-10-CM

## 2014-11-04 MED ORDER — AZITHROMYCIN 250 MG PO TABS: ORAL_TABLET | ORAL | Status: DC

## 2014-11-04 NOTE — Progress Notes (Signed)
   Subjective:    Patient ID: Paul Paul, male    DOB: 03/25/1956, 58 y.o.   MRN: [id number]  HPI Here for 3 days of PND, chest tightness and coughing up yellow sputum. On Mucinex.   Review of Systems  Constitutional: Negative.   HENT: Positive for congestion and postnasal drip. Negative for sinus pressure.   Eyes: Negative.   Respiratory: Positive for cough and chest tightness. Negative for shortness of breath and wheezing.        Objective:   Physical Exam  Constitutional: He appears well-developed and well-nourished.  HENT:  Right Ear: External ear normal.  Left Ear: External ear normal.  Nose: Nose normal.  Mouth/Throat: Oropharynx is clear and moist.  Eyes: Conjunctivae are normal.  Pulmonary/Chest: Effort normal. No respiratory distress. He has no wheezes. He has no rales.  Scattered rhonchi   Lymphadenopathy:    He has no cervical adenopathy.          Assessment & Plan:  Recheck prn

## 2014-11-04 NOTE — Progress Notes (Signed)
Pre visit review using our clinic review tool, if applicable. No additional management support is needed unless otherwise documented below in the visit note. 

## 2014-11-10 ENCOUNTER — Ambulatory Visit (INDEPENDENT_AMBULATORY_CARE_PROVIDER_SITE_OTHER): Payer: Medicare HMO | Admitting: Family Medicine

## 2014-11-10 ENCOUNTER — Encounter: Payer: Self-pay | Admitting: Family Medicine

## 2014-11-10 VITALS — BP 104/70 | HR 60 | Temp 98.4°F | Wt 259.0 lb

## 2014-11-10 DIAGNOSIS — Z0000 Encounter for general adult medical examination without abnormal findings: Secondary | ICD-10-CM

## 2014-11-10 DIAGNOSIS — IMO0001 Reserved for inherently not codable concepts without codable children: Secondary | ICD-10-CM

## 2014-11-10 DIAGNOSIS — E785 Hyperlipidemia, unspecified: Secondary | ICD-10-CM

## 2014-11-10 DIAGNOSIS — E119 Type 2 diabetes mellitus without complications: Secondary | ICD-10-CM

## 2014-11-10 DIAGNOSIS — I10 Essential (primary) hypertension: Secondary | ICD-10-CM

## 2014-11-10 LAB — LIPID PANEL
CHOLESTEROL: 185 mg/dL (ref 0–200)
HDL: 38.4 mg/dL — AB (ref >39.00)
LDL Cholesterol: 120 mg/dL — ABNORMAL HIGH (ref 0–99)
NonHDL: 146.6
Total CHOL/HDL Ratio: 5
Triglycerides: 133 mg/dL (ref 0.0–149.0)
VLDL: 26.6 mg/dL (ref 0.0–40.0)

## 2014-11-10 LAB — TSH: TSH: 1.09 u[IU]/mL (ref 0.35–4.50)

## 2014-11-10 LAB — COMPREHENSIVE METABOLIC PANEL
ALT: 18 U/L (ref 0–53)
AST: 19 U/L (ref 0–37)
Albumin: 4.1 g/dL (ref 3.5–5.2)
Alkaline Phosphatase: 34 U/L — ABNORMAL LOW (ref 39–117)
BUN: 13 mg/dL (ref 6–23)
CALCIUM: 9.7 mg/dL (ref 8.4–10.5)
CHLORIDE: 105 meq/L (ref 96–112)
CO2: 23 mEq/L (ref 19–32)
CREATININE: 1 mg/dL (ref 0.4–1.5)
GFR: 79.29 mL/min (ref >60.00)
GLUCOSE: 115 mg/dL — AB (ref 70–99)
Potassium: 4.2 mEq/L (ref 3.5–5.1)
Sodium: 137 mEq/L (ref 135–145)
Total Bilirubin: 0.9 mg/dL (ref 0.2–1.2)
Total Protein: 7.8 g/dL (ref 6.0–8.3)

## 2014-11-10 LAB — CBC
HCT: 50.6 % (ref 39.0–52.0)
Hemoglobin: 16.9 g/dL (ref 13.0–17.0)
MCHC: 33.5 g/dL (ref 30.0–36.0)
MCV: 90.3 fl (ref 78.0–100.0)
PLATELETS: 296 10*3/uL (ref 150.0–400.0)
RBC: 5.61 Mil/uL (ref 4.22–5.81)
RDW: 13.1 % (ref 11.5–15.5)
WBC: 6.6 10*3/uL (ref 4.0–10.5)

## 2014-11-10 LAB — HEMOGLOBIN A1C: HEMOGLOBIN A1C: 6.9 % — AB (ref 4.6–6.5)

## 2014-11-10 NOTE — Assessment & Plan Note (Signed)
Well-controlled with diet and exercise previously. Recheck A1c today. Weight up slightly and patient knows he needs to work on this

## 2014-11-10 NOTE — Progress Notes (Signed)
Paul ConchStephen Hunter, MD Phone: [phone]  Subjective:  Patient presents today for annual physical. Chief complaint-noted.   Patient has been doing very well and continues to exercise on a regular basis. He is not interested in prostate cancer screening given his cardiac and neurological conditions. He suspects his brain or neurological conditions along be an Issue before prostate cancer. Patient has been controlling his diabetes with diet and exercise. He prefers to stay off of cholesterol medication but also wants to know what numbers are. I advised him in the past that he should be on cholesterol medicine but he continues to decline. His hypertension has been well-controlled with metoprolol.  ROS-full ROS completed and negative except for some complaints of shoulder pain for which he is followed orthopedics. Has no chest pain or shortness. No polyuria or blurred vision.  The following were reviewed and entered/updated in epic: Past Medical History  Diagnosis Date  . HYPERTENSION 12/10/2007  . Diabetes mellitus     now under control  . Pacemaker   . Bradycardia   . Dysrhythmia     BRADYCARDIA REQUIRING PACEMAKER .FOLLOWED BY Paul Paul  . Sleep apnea     CPAP.NOT WEARING NOW.NEEDS REPLACEMENT PIECE  . Arthritis   . Shoulder pain     L SHOULDER. RECENT FALL  . Shortness of breath     WITH EXERTION  . Anxiety   . Bell palsy     > 30 years  . Constipation   . Cerebral aneurysm    Patient Active Problem List   Diagnosis Date Noted  . Cerebral aneurysm without rupture 01/01/2012    Priority: High  . History of pacemaker 10/07/2011    Priority: High  . Well controlled type 2 diabetes mellitus 11/09/2008    Priority: High  . OSA on CPAP 09/24/2014    Priority: Medium  . Hyperlipidemia 09/24/2014    Priority: Medium  . Other specified cardiac dysrhythmias(427.89) 07/09/2012    Priority: Medium  . Essential hypertension 12/10/2007    Priority: Medium  . Obesity 01/01/2014   Priority: Low   Past Surgical History  Procedure Laterality Date  . Septal deviation    . Knee surgery      left; arthoscopic  . Insert / replace / remove pacemaker    . Rotator cuff surgery  02/2012  . Cardiac catheterization  07/06/11    normal  . Fracture surgery      left foot    Family History  Problem Relation Age of Onset  . Heart attack Mother     died 4154, smoker  . Heart disease Mother   . Diabetes Mother   . Heart attack Father     died 3154, smoker  . Heart disease Father     Medications- reviewed and updated Current Outpatient Prescriptions  Medication Sig Dispense Refill  . aspirin EC 81 MG tablet Take 81 mg by mouth daily.    . metoprolol succinate (TOPROL-XL) 100 MG 24 hr tablet Take 1 tablet (100 mg total) by mouth every morning. 90 tablet 3  . Multiple Vitamin (MULTIVITAMIN) tablet Take 1 tablet by mouth daily.     No current facility-administered medications for this visit.    Allergies-reviewed and updated Allergies  Allergen Reactions  . Losartan Potassium-Hctz Other (See Comments)     dizziness, fatigue  . Prednisone Other (See Comments)    Raises blood sugar  . Doxycycline Rash  . Lisinopril Rash    History   Social History  .  Marital Status: Married    Spouse Name: N/A    Number of Children: N/A  . Years of Education: N/A   Social History Main Topics  . Smoking status: Former Smoker -- 1.00 packs/day for 20 years    Types: Cigarettes    Quit date: 07/05/2011  . Smokeless tobacco: Never Used  . Alcohol Use: No  . Drug Use: No  . Sexual Activity: None   Other Topics Concern  . None   Social History Narrative   Married over 30 years in 2015. 1 step son. 1 grandson.       Disabled from brain aneurysm, pacemaker. Retired from [employer]-37 years.       Hobbies: grandson, exercise at [facility]    ROS--See HPI   Objective: BP 104/70 mmHg  Pulse 60  Temp(Src) 98.4 F (36.9 C)  Wt 259 lb (117.482 kg) Gen: NAD, resting  comfortably HEENT: Mucous membranes are moist. Oropharynx normal but poor dentition (needs to see dentist) Neck: no thyromegaly CV: RRR no murmurs rubs or gallops Lungs: CTAB no crackles, wheeze, rhonchi Abdomen: soft/nontender/nondistended/normal bowel sounds. No rebound or guarding.  Ext: no edema Rectal: declines Skin: warm, dry, upper body skin exam without any cancerous or precancerous lesions Neuro: grossly normal, moves all extremities, PERRLA   Assessment/Plan:  58 y.o. male presenting for annual physical.  Health Maintenance counseling: 1. Anticipatory guidance: Patient counseled regarding regular dental exams, wearing seatbelts, seeing eye doctor including diabetic exam, wearing sunscreen.  2. Risk factor reduction:  Advised patient of need for regular exercise  (workout 5-6 days a week) and diet rich and fruits and vegetables to reduce risk of heart attack and stroke.  3. Immunizations/screenings/ancillary studies - Refuses pneumonia 4. Update fasting bloodwork- does not want PSA testing given cardiac history  Well controlled type 2 diabetes mellitus Well-controlled with diet and exercise previously. Recheck A1c today. Weight up slightly and patient knows he needs to work on this  Hyperlipidemia Check fasting lipids today. Advised patient with his diabetes history he should be on statin. He wants to know his 10 year risk before making a decision- I agreed to calculate this for him not including his diabetes history.   Fasting labs today Orders Placed This Encounter  Procedures  . CBC    [place]  . Comprehensive metabolic panel    [place]    Order Specific Question:  Has the patient fasted?    Answer:  No  . Lipid panel    [place]    Order Specific Question:  Has the patient fasted?    Answer:  No  . Hemoglobin A1c    [place]  . TSH    [place]

## 2014-11-10 NOTE — Assessment & Plan Note (Signed)
Check fasting lipids today. Advised patient with his diabetes history he should be on statin. He wants to know his 10 year risk before making a decision- I agreed to calculate this for him not including his diabetes history.

## 2014-11-10 NOTE — Patient Instructions (Addendum)
58 y.o. male presenting for annual physical.  Health Maintenance counseling: 1. Anticipatory guidance: Patient counseled regarding regular dental exams-please go see them, wearing seatbelts, seeing eye doctor including diabetic exam, wearing sunscreen.  2. Risk factor reduction:  Advised patient of need for regular exercise  (workout 5-6 days a week) and diet rich and fruits and vegetables to reduce risk of heart attack and stroke.  3. Immunizations/screenings/ancillary studies - Refuses pneumonia 4. Update fasting bloodwork- does not want PSA testing given cardiac history. Should have them back to you by next Monday at the latest.

## 2014-11-17 ENCOUNTER — Telehealth: Payer: Self-pay | Admitting: Family Medicine

## 2014-11-17 NOTE — Telephone Encounter (Signed)
Pt request refill of the following: metoprolol succinate (TOPROL-XL) 100 MG 24 hr tablet   Phamacy: [facility]  [phone]

## 2014-11-18 MED ORDER — METOPROLOL SUCCINATE ER 100 MG PO TB24: 100.0000 mg | ORAL_TABLET | Freq: Every morning | ORAL | Status: DC

## 2014-11-18 NOTE — Telephone Encounter (Signed)
Medication refilled

## 2014-12-17 ENCOUNTER — Telehealth: Payer: Self-pay | Admitting: Family Medicine

## 2014-12-17 NOTE — Telephone Encounter (Signed)
Pt has appt w/ [facility], dr dean  On [facility] jan 21. Needs referral  Pt has been there several times. left knee and right shoulder

## 2014-12-18 NOTE — Telephone Encounter (Signed)
Referral done  Outpatient Authorization #[id number]  12/24/2014 - 06/22/2015 [facility] Address: [address], [place], [place] [address]

## 2014-12-21 ENCOUNTER — Other Ambulatory Visit: Payer: Self-pay

## 2014-12-21 MED ORDER — METOPROLOL SUCCINATE ER 100 MG PO TB24: 100.0000 mg | ORAL_TABLET | Freq: Every morning | ORAL | Status: DC

## 2014-12-24 DIAGNOSIS — M25562 Pain in left knee: Secondary | ICD-10-CM | POA: Diagnosis not present

## 2014-12-24 DIAGNOSIS — M25511 Pain in right shoulder: Secondary | ICD-10-CM | POA: Diagnosis not present

## 2015-01-05 ENCOUNTER — Encounter: Payer: Self-pay | Admitting: Family Medicine

## 2015-01-05 ENCOUNTER — Ambulatory Visit (INDEPENDENT_AMBULATORY_CARE_PROVIDER_SITE_OTHER): Payer: Commercial Managed Care - HMO | Admitting: Family Medicine

## 2015-01-05 VITALS — BP 122/76 | HR 68 | Temp 98.4°F | Ht 75.0 in | Wt 267.2 lb

## 2015-01-05 DIAGNOSIS — J069 Acute upper respiratory infection, unspecified: Secondary | ICD-10-CM | POA: Diagnosis not present

## 2015-01-05 MED ORDER — BENZONATATE 100 MG PO CAPS: 100.0000 mg | ORAL_CAPSULE | Freq: Two times a day (BID) | ORAL | Status: DC | PRN

## 2015-01-05 NOTE — Progress Notes (Signed)
Pre visit review using our clinic review tool, if applicable. No additional management support is needed unless otherwise documented below in the visit note. 

## 2015-01-05 NOTE — Progress Notes (Signed)
HPI:  Paul BrookeCarl W Paul is a pleasant 59 yo pt of Dr Durene CalHunter here for an acute visit for:  "sore throat and cough" -started: 3 days ago -symptoms:nasal congestion, sore throat, cough -denies:fever, SOB, NVD, tooth pain -has tried: nyquil which helps -sick contacts/travel/risks: denies flu exposure, tick exposure or or Ebola risks -Hx of: denies hx COPD, asthma, immunosuppression ROS: See pertinent positives and negatives per HPI.  Past Medical History  Diagnosis Date  . HYPERTENSION 12/10/2007  . Diabetes mellitus     now under control  . Pacemaker   . Bradycardia   . Dysrhythmia     BRADYCARDIA REQUIRING PACEMAKER .FOLLOWED BY GREGG TAYLOR  . Sleep apnea     CPAP.NOT WEARING NOW.NEEDS REPLACEMENT PIECE  . Arthritis   . Shoulder pain     L SHOULDER. RECENT FALL  . Shortness of breath     WITH EXERTION  . Anxiety   . Bell palsy     > 30 years  . Constipation   . Cerebral aneurysm     Past Surgical History  Procedure Laterality Date  . Septal deviation    . Knee surgery      left; arthoscopic  . Insert / replace / remove pacemaker    . Rotator cuff surgery  02/2012  . Cardiac catheterization  07/06/11    normal  . Fracture surgery      left foot    Family History  Problem Relation Age of Onset  . Heart attack Mother     died 4854, smoker  . Heart disease Mother   . Diabetes Mother   . Heart attack Father     died 6754, smoker  . Heart disease Father     History   Social History  . Marital Status: Married    Spouse Name: N/A    Number of Children: N/A  . Years of Education: N/A   Social History Main Topics  . Smoking status: Former Smoker -- 1.00 packs/day for 20 years    Types: Cigarettes    Quit date: 07/05/2011  . Smokeless tobacco: Never Used  . Alcohol Use: No  . Drug Use: No  . Sexual Activity: None   Other Topics Concern  . None   Social History Narrative   Married over 30 years in 2015. 1 step son. 1 grandson.       Disabled from brain  aneurysm, pacemaker. Retired from [employer] business-37 years.       Hobbies: grandson, exercise at [facility] [employer] [facility] [employer]     Current outpatient prescriptions:  .  aspirin EC 81 MG tablet, Take 81 mg by mouth daily., Disp: , Rfl:  .  metoprolol succinate (TOPROL-XL) 100 MG 24 hr tablet, Take 1 tablet (100 mg total) by mouth every morning., Disp: 90 tablet, Rfl: 0 .  Multiple Vitamin (MULTIVITAMIN) tablet, Take 1 tablet by mouth daily., Disp: , Rfl:  .  benzonatate (TESSALON) 100 MG capsule, Take 1 capsule (100 mg total) by mouth 2 (two) times daily as needed for cough., Disp: 20 capsule, Rfl: 0  EXAM:  Filed Vitals:   01/05/15 0851  BP: 122/76  Pulse: 68  Temp: 98.4 F (36.9 C)    Body mass index is 33.4 kg/(m^2).  GENERAL: vitals reviewed and listed above, alert, oriented, appears well hydrated and in no acute distress  HEENT: atraumatic, conjunttiva clear, no obvious abnormalities on inspection of external nose and ears, normal appearance of ear canals and TMs, clear nasal congestion, mild post  oropharyngeal erythema with PND, no tonsillar edema or exudate, no sinus TTP  NECK: no obvious masses on inspection  LUNGS: clear to auscultation bilaterally, no wheezes, rales or rhonchi, good air movement  CV: HRRR, no peripheral edema  MS: moves all extremities without noticeable abnormality  PSYCH: pleasant and cooperative, no obvious depression or anxiety  ASSESSMENT AND PLAN:  Discussed the following assessment and plan:  Acute upper respiratory infection  -given HPI and exam findings today, a serious infection or illness is unlikely. We discussed potential etiologies, with VURI being most likely, and advised supportive care and monitoring. We discussed treatment side effects, likely course, antibiotic misuse, transmission, and signs of developing a serious illness. -of course, we advised to return or notify a doctor immediately if symptoms worsen or persist or new concerns  arise.    Patient Instructions  INSTRUCTIONS FOR UPPER RESPIRATORY INFECTION:  -plenty of rest and fluids  -nasal saline wash 2-3 times daily (use prepackaged nasal saline or bottled/distilled water if making your own)   -can use AFRIN nasal spray for 3 days for drainage and nasal congestion - but do NOT use longer then 3-4 days  -can use tylenol or ibuprofen as directed for aches and sorethroat  -in the winter time, using a humidifier at night is helpful (please follow cleaning instructions)  -if you are taking a cough medication - use only as directed, may also try a teaspoon of honey to coat the throat and throat lozenges  -for sore throat, salt water gargles can help  -follow up if you have fevers, facial pain, tooth pain, difficulty breathing or are worsening or not getting better as expected (most of these viral respiratory infections last 1-3 weeks)      Kriste Basque R.

## 2015-01-05 NOTE — Patient Instructions (Signed)
INSTRUCTIONS FOR UPPER RESPIRATORY INFECTION:  -plenty of rest and fluids  -nasal saline wash 2-3 times daily (use prepackaged nasal saline or bottled/distilled water if making your own)   -can use AFRIN nasal spray for 3 days for drainage and nasal congestion - but do NOT use longer then 3-4 days  -can use tylenol or ibuprofen as directed for aches and sorethroat  -in the winter time, using a humidifier at night is helpful (please follow cleaning instructions)  -if you are taking a cough medication - use only as directed, may also try a teaspoon of honey to coat the throat and throat lozenges  -for sore throat, salt water gargles can help  -follow up if you have fevers, facial pain, tooth pain, difficulty breathing or are worsening or not getting better as expected (most of these viral respiratory infections last 1-3 weeks)

## 2015-01-12 ENCOUNTER — Encounter: Payer: Self-pay | Admitting: *Deleted

## 2015-01-13 ENCOUNTER — Encounter: Payer: Self-pay | Admitting: Internal Medicine

## 2015-01-13 ENCOUNTER — Ambulatory Visit (INDEPENDENT_AMBULATORY_CARE_PROVIDER_SITE_OTHER): Payer: Commercial Managed Care - HMO | Admitting: Internal Medicine

## 2015-01-13 VITALS — BP 124/80 | HR 60 | Temp 98.5°F | Resp 20 | Ht 75.0 in | Wt 267.0 lb

## 2015-01-13 DIAGNOSIS — I10 Essential (primary) hypertension: Secondary | ICD-10-CM | POA: Diagnosis not present

## 2015-01-13 DIAGNOSIS — J441 Chronic obstructive pulmonary disease with (acute) exacerbation: Secondary | ICD-10-CM | POA: Diagnosis not present

## 2015-01-13 DIAGNOSIS — E785 Hyperlipidemia, unspecified: Secondary | ICD-10-CM | POA: Diagnosis not present

## 2015-01-13 DIAGNOSIS — E119 Type 2 diabetes mellitus without complications: Secondary | ICD-10-CM | POA: Diagnosis not present

## 2015-01-13 MED ORDER — AZITHROMYCIN 250 MG PO TABS: ORAL_TABLET | ORAL | Status: DC

## 2015-01-13 MED ORDER — PREDNISONE 10 MG PO TABS: 10.0000 mg | ORAL_TABLET | Freq: Two times a day (BID) | ORAL | Status: DC

## 2015-01-13 NOTE — Progress Notes (Signed)
Subjective:    Patient ID: Paul BrookeCarl W Paul, male    DOB: 02/16/1956, 59 y.o.   MRN: [id number]  HPI 59 year old patient who has remote history of tobacco use.  He discontinued about 5 years ago after cardiac complications. For the past 3 weeks he has had cold symptoms with the cough.  More recently he has become more short of breath with fever.  He has developed wheezing especially at night and worsening productive cough yielding green sputum.  He was seen in the office about 1 week ago and treated symptomatically.  His symptoms have intensified. Stable medical problems include hypertension and type 2 diabetes.  Past Medical History  Diagnosis Date  . HYPERTENSION 12/10/2007  . Diabetes mellitus     now under control  . Pacemaker   . Bradycardia   . Dysrhythmia     BRADYCARDIA REQUIRING PACEMAKER .FOLLOWED BY GREGG TAYLOR  . Sleep apnea     CPAP.NOT WEARING NOW.NEEDS REPLACEMENT PIECE  . Arthritis   . Shoulder pain     L SHOULDER. RECENT FALL  . Shortness of breath     WITH EXERTION  . Anxiety   . Bell palsy     > 30 years  . Constipation   . Cerebral aneurysm     History   Social History  . Marital Status: Married    Spouse Name: N/A  . Number of Children: N/A  . Years of Education: N/A   Occupational History  . Not on file.   Social History Main Topics  . Smoking status: Former Smoker -- 1.00 packs/day for 20 years    Types: Cigarettes    Quit date: 07/05/2011  . Smokeless tobacco: Never Used  . Alcohol Use: No  . Drug Use: No  . Sexual Activity: Not on file   Other Topics Concern  . Not on file   Social History Narrative   Married over 30 years in 2015. 1 step son. 1 grandson.       Disabled from brain aneurysm, pacemaker. Retired from [employer] business-37 years.       Hobbies: grandson, exercise at [facility]    Past Surgical History  Procedure Laterality Date  . Septal deviation    . Knee surgery      left; arthoscopic  . Insert / replace / remove  pacemaker    . Rotator cuff surgery  02/2012  . Cardiac catheterization  07/06/11    normal  . Fracture surgery      left foot    Family History  Problem Relation Age of Onset  . Heart attack Mother     died 8054, smoker  . Heart disease Mother   . Diabetes Mother   . Heart attack Father     died 4754, smoker  . Heart disease Father     Allergies  Allergen Reactions  . Losartan Potassium-Hctz Other (See Comments)     dizziness, fatigue  . Prednisone Other (See Comments)    Raises blood sugar  . Doxycycline Rash  . Lisinopril Rash    Current Outpatient Prescriptions on File Prior to Visit  Medication Sig Dispense Refill  . aspirin EC 81 MG tablet Take 81 mg by mouth daily.    . metoprolol succinate (TOPROL-XL) 100 MG 24 hr tablet Take 1 tablet (100 mg total) by mouth every morning. 90 tablet 0  . Multiple Vitamin (MULTIVITAMIN) tablet Take 1 tablet by mouth daily.     No current facility-administered medications on file  prior to visit.    BP 124/80 mmHg  Pulse 60  Temp(Src) 98.5 F (36.9 C) (Oral)  Resp 20  Ht  (1.905 m)  Wt 267 lb (121.11 kg)  BMI 33.37 kg/m2  SpO2 96%      Review of Systems  Constitutional: Positive for activity change, appetite change and fatigue. Negative for fever and chills.  HENT: Positive for congestion and sinus pressure. Negative for dental problem, ear pain, hearing loss, sore throat, tinnitus, trouble swallowing and voice change.   Eyes: Negative for pain, discharge and visual disturbance.  Respiratory: Positive for cough, shortness of breath and wheezing. Negative for chest tightness and stridor.   Cardiovascular: Negative for chest pain, palpitations and leg swelling.  Gastrointestinal: Negative for nausea, vomiting, abdominal pain, diarrhea, constipation, blood in stool and abdominal distention.  Genitourinary: Negative for urgency, hematuria, flank pain, discharge, difficulty urinating and genital sores.  Musculoskeletal:  Negative for myalgias, back pain, joint swelling, arthralgias, gait problem and neck stiffness.  Skin: Negative for rash.  Neurological: Negative for dizziness, syncope, speech difficulty, weakness, numbness and headaches.  Hematological: Negative for adenopathy. Does not bruise/bleed easily.  Psychiatric/Behavioral: Negative for behavioral problems and dysphoric mood. The patient is not nervous/anxious.        Objective:   Physical Exam  Constitutional: He is oriented to person, place, and time. He appears well-developed.  HENT:  Head: Normocephalic.  Right Ear: External ear normal.  Left Ear: External ear normal.  Eyes: Conjunctivae and EOM are normal.  Neck: Normal range of motion.  Cardiovascular: Normal rate and normal heart sounds.   Pulmonary/Chest: Effort normal.  A few scattered coarse rhonchi and faint wheezing heard anteriorly  Abdominal: Bowel sounds are normal.  Musculoskeletal: Normal range of motion. He exhibits no edema or tenderness.  Neurological: He is alert and oriented to person, place, and time.  Psychiatric: He has a normal mood and affect. His behavior is normal.          Assessment & Plan:   Exacerbation COPD.  Will treat with azithromycin.  7 days of oral prednisone.  We'll continue spectrums.  He's been asked to force fluids, rest and to call.  There is any clinical worsening Hypertension, stable Type 2 diabetes.  Presently diet controlled.  We'll watch blood sugars more carefully.  Short-term

## 2015-01-13 NOTE — Progress Notes (Signed)
Pre visit review using our clinic review tool, if applicable. No additional management support is needed unless otherwise documented below in the visit note. 

## 2015-01-13 NOTE — Patient Instructions (Addendum)
Take your antibiotic as prescribed until ALL of it is gone, but stop if you develop a rash, swelling, or any side effects of the medication.  Contact our office as soon as possible if  there are side effects of the medication.    Take over-the-counter expectorants and cough medications such as  Mucinex DM.  Call if there is no improvement in 5 to 7 days or if  you develop worsening cough, fever, or new symptoms, such as shortness of breath or chest pain.  HOME CARE INSTRUCTIONS   Avoid exposure to all substances that irritate the airway, especially to tobacco smoke.  If you were prescribed an antibiotic medicine, finish it all even if you start to feel better.  Take all medicines as directed by your health care provider. It is important to use correct technique with inhaled medicines.  Drink enough fluids to keep your urine clear or pale yellow (unless you have a medical condition that requires fluid restriction).  Use a cool mist vaporizer. This makes it easier to clear your chest when you cough.  If you have a home nebulizer and oxygen, continue to use them as directed.  Maintain all necessary vaccinations to prevent infections.  Exercise regularly.  Eat a healthy diet.  Keep all follow-up appointments as directed by your health care provider. SEEK IMMEDIATE MEDICAL CARE IF:  You have worsening shortness of breath.  You have trouble talking.  You have severe chest pain.  You have blood in your sputum.  You have a fever.  You have weakness, vomit repeatedly, or faint.  You feel confused.  You continue to get worse.

## 2015-01-19 ENCOUNTER — Telehealth: Payer: Self-pay | Admitting: Family Medicine

## 2015-01-19 DIAGNOSIS — Z950 Presence of cardiac pacemaker: Secondary | ICD-10-CM

## 2015-01-19 NOTE — Telephone Encounter (Signed)
Pt called to request a referral to   Dr Lad[facility]gelaylor at  [facility]  appt is 01/21/15

## 2015-01-19 NOTE — Telephone Encounter (Signed)
Referral entered  

## 2015-01-21 ENCOUNTER — Ambulatory Visit (INDEPENDENT_AMBULATORY_CARE_PROVIDER_SITE_OTHER): Payer: Commercial Managed Care - HMO | Admitting: *Deleted

## 2015-01-21 DIAGNOSIS — R001 Bradycardia, unspecified: Secondary | ICD-10-CM

## 2015-01-21 LAB — MDC_IDC_ENUM_SESS_TYPE_INCLINIC
Brady Statistic RV Percent Paced: 0.07 %
Date Time Interrogation Session: 20160218122151
Implantable Pulse Generator Model: 2210
Implantable Pulse Generator Serial Number: 7250770
Lead Channel Impedance Value: 462.5 Ohm
Lead Channel Pacing Threshold Amplitude: 0.75 V
Lead Channel Pacing Threshold Amplitude: 1.125 V
Lead Channel Pacing Threshold Pulse Width: 0.4 ms
Lead Channel Sensing Intrinsic Amplitude: 12 mV
Lead Channel Setting Pacing Amplitude: 1.375
Lead Channel Setting Pacing Amplitude: 2 V
MDC IDC MSMT BATTERY REMAINING LONGEVITY: 115.2 mo
MDC IDC MSMT BATTERY VOLTAGE: 2.95 V
MDC IDC MSMT LEADCHNL RA IMPEDANCE VALUE: 412.5 Ohm
MDC IDC MSMT LEADCHNL RA PACING THRESHOLD AMPLITUDE: 0.75 V
MDC IDC MSMT LEADCHNL RA PACING THRESHOLD PULSEWIDTH: 0.4 ms
MDC IDC MSMT LEADCHNL RA SENSING INTR AMPL: 5 mV
MDC IDC MSMT LEADCHNL RV PACING THRESHOLD PULSEWIDTH: 0.4 ms
MDC IDC SET LEADCHNL RV PACING PULSEWIDTH: 0.4 ms
MDC IDC SET LEADCHNL RV SENSING SENSITIVITY: 2 mV
MDC IDC STAT BRADY RA PERCENT PACED: 87 %

## 2015-01-21 NOTE — Progress Notes (Signed)
Pacemaker check in clinic. Normal device function. Thresholds, sensing, impedances consistent with previous measurements. Device programmed to maximize longevity. No mode switch or high ventricular rates noted. Device programmed at appropriate safety margins. Histogram distribution appropriate for patient activity level. Device programmed to optimize intrinsic conduction. Estimated longevity 9.1-9.6 years. Patient will follow up with GT on 4/12 @ 12:15pm.

## 2015-02-09 ENCOUNTER — Encounter: Payer: Self-pay | Admitting: Internal Medicine

## 2015-03-01 ENCOUNTER — Telehealth: Payer: Self-pay | Admitting: Family Medicine

## 2015-03-01 MED ORDER — METOPROLOL SUCCINATE ER 100 MG PO TB24: 100.0000 mg | ORAL_TABLET | Freq: Every morning | ORAL | Status: DC

## 2015-03-01 NOTE — Telephone Encounter (Signed)
Pt needs a refill on metorolol 100 mg #90 w/refills sent to [facility]

## 2015-03-01 NOTE — Telephone Encounter (Signed)
Refill sent in

## 2015-03-16 ENCOUNTER — Ambulatory Visit (INDEPENDENT_AMBULATORY_CARE_PROVIDER_SITE_OTHER): Payer: Commercial Managed Care - HMO | Admitting: Internal Medicine

## 2015-03-16 ENCOUNTER — Encounter: Payer: Self-pay | Admitting: Internal Medicine

## 2015-03-16 VITALS — BP 100/66 | HR 60 | Ht 75.0 in | Wt 266.2 lb

## 2015-03-16 DIAGNOSIS — Z950 Presence of cardiac pacemaker: Secondary | ICD-10-CM | POA: Diagnosis not present

## 2015-03-16 DIAGNOSIS — I10 Essential (primary) hypertension: Secondary | ICD-10-CM | POA: Diagnosis not present

## 2015-03-16 DIAGNOSIS — E669 Obesity, unspecified: Secondary | ICD-10-CM | POA: Diagnosis not present

## 2015-03-16 DIAGNOSIS — Z8679 Personal history of other diseases of the circulatory system: Secondary | ICD-10-CM | POA: Diagnosis not present

## 2015-03-16 LAB — MDC_IDC_ENUM_SESS_TYPE_INCLINIC
Battery Remaining Longevity: 112.8 mo
Battery Voltage: 2.95 V
Brady Statistic RA Percent Paced: 93 %
Brady Statistic RV Percent Paced: 0.08 %
Date Time Interrogation Session: 20160412123908
Implantable Pulse Generator Model: 2210
Implantable Pulse Generator Serial Number: 7250770
Lead Channel Impedance Value: 512.5 Ohm
Lead Channel Pacing Threshold Amplitude: 0.75 V
Lead Channel Pacing Threshold Amplitude: 1 V
Lead Channel Pacing Threshold Pulse Width: 0.4 ms
Lead Channel Sensing Intrinsic Amplitude: 11.3 mV
Lead Channel Sensing Intrinsic Amplitude: 5 mV
Lead Channel Setting Pacing Amplitude: 2 V
Lead Channel Setting Pacing Pulse Width: 0.4 ms
Lead Channel Setting Sensing Sensitivity: 2 mV
MDC IDC MSMT LEADCHNL RA IMPEDANCE VALUE: 412.5 Ohm
MDC IDC MSMT LEADCHNL RA PACING THRESHOLD PULSEWIDTH: 0.4 ms
MDC IDC SET LEADCHNL RV PACING AMPLITUDE: 1.375

## 2015-03-16 NOTE — Patient Instructions (Signed)
Your physician wants you to follow-up in: 6 months in the device clinic and 12 months with Dr Taylor You will receive a reminder letter in the mail two months in advance. If you don't receive a letter, please call our office to schedule the follow-up appointment.  

## 2015-03-16 NOTE — Assessment & Plan Note (Signed)
His blood pressure is well controlled. Will follow. 

## 2015-03-16 NOTE — Progress Notes (Signed)
HPI [name] returns today for followup. He is a pleasant 59 yo man with symptomatic bradycardia, s/p PPM, HTN, obesity and a cerebral aneurysm, status post treatment.  He denies chest pain or sob. He has started back exercising, and lost 60 pounds, and his diabetes has resolved. No syncope. No new problems since his last visit. Allergies  Allergen Reactions  . Losartan Potassium-Hctz Other (See Comments)     dizziness, fatigue  . Prednisone Other (See Comments)    Raises blood sugar  . Doxycycline Rash  . Lisinopril Rash     Current Outpatient Prescriptions  Medication Sig Dispense Refill  . aspirin EC 81 MG tablet Take 81 mg by mouth daily.    . metoprolol succinate (TOPROL-XL) 100 MG 24 hr tablet Take 1 tablet (100 mg total) by mouth every morning. 90 tablet 2  . Multiple Vitamin (MULTIVITAMIN) tablet Take 1 tablet by mouth daily.     No current facility-administered medications for this visit.     Past Medical History  Diagnosis Date  . HYPERTENSION 12/10/2007  . Diabetes mellitus     now under control  . Pacemaker   . Bradycardia   . Dysrhythmia     BRADYCARDIA REQUIRING PACEMAKER .FOLLOWED BY GREGG TAYLOR  . Sleep apnea     CPAP.NOT WEARING NOW.NEEDS REPLACEMENT PIECE  . Arthritis   . Shoulder pain     L SHOULDER. RECENT FALL  . Shortness of breath     WITH EXERTION  . Anxiety   . Bell palsy     > 30 years  . Constipation   . Cerebral aneurysm     ROS:   All systems reviewed and negative except as noted in the HPI.   Past Surgical History  Procedure Laterality Date  . Septal deviation    . Knee surgery      left; arthoscopic  . Insert / replace / remove pacemaker    . Rotator cuff surgery  02/2012  . Cardiac catheterization  07/06/11    normal  . Fracture surgery      left foot     Family History  Problem Relation Age of Onset  . Heart attack Mother     died 854, smoker  . Heart disease Mother   . Diabetes Mother   . Heart attack Father    died 6754, smoker  . Heart disease Father      History   Social History  . Marital Status: Married    Spouse Name: N/A  . Number of Children: N/A  . Years of Education: N/A   Occupational History  . Not on file.   Social History Main Topics  . Smoking status: Former Smoker -- 1.00 packs/day for 20 years    Types: Cigarettes    Quit date: 07/05/2011  . Smokeless tobacco: Never Used  . Alcohol Use: No  . Drug Use: No  . Sexual Activity: Not on file   Other Topics Concern  . Not on file   Social History Narrative   Married over 30 years in 2015. 1 step son. 1 grandson.       Disabled from brain aneurysm, pacemaker. Retired from [employer] business-37 years.       Hobbies: grandson, exercise at [facility] [facility] [employer]     BP 100/66 mmHg  Pulse 60  Ht 6\' 3"  (1.905 m)  Wt 266 lb 3.2 oz (120.748 kg)  BMI 33.27 kg/m2  Physical Exam:  stable appearing middle aged man, NAD HEENT: Unremarkable  Neck:  6 cm JVD, no thyromegally Lungs:  Clear with no wheezes, rales, or rhonchi. Well healed PPM incision. HEART:  Regular rate rhythm, no murmurs, no rubs, no clicks Abd:  soft, positive bowel sounds, no organomegally, no rebound, no guarding Ext:  2 plus pulses, no edema, no cyanosis, no clubbing Skin:  No rashes no nodules Neuro:  CN II through XII intact, motor grossly intact  DEVICE  Normal device function.  See PaceArt for details.   Assess/Plan:

## 2015-03-16 NOTE — Assessment & Plan Note (Signed)
His weight remains under control. He continues to exercise regularly. Will follow.

## 2015-03-16 NOTE — Assessment & Plan Note (Signed)
His [employer] DDD PM is working normally. Will recheck in several months. 

## 2015-03-24 DIAGNOSIS — M25562 Pain in left knee: Secondary | ICD-10-CM | POA: Diagnosis not present

## 2015-03-24 DIAGNOSIS — M25511 Pain in right shoulder: Secondary | ICD-10-CM | POA: Diagnosis not present

## 2015-05-05 ENCOUNTER — Ambulatory Visit (INDEPENDENT_AMBULATORY_CARE_PROVIDER_SITE_OTHER): Payer: Commercial Managed Care - HMO | Admitting: Family Medicine

## 2015-05-05 ENCOUNTER — Encounter: Payer: Self-pay | Admitting: Family Medicine

## 2015-05-05 VITALS — BP 100/62 | HR 65 | Temp 98.4°F | Wt 265.0 lb

## 2015-05-05 DIAGNOSIS — J020 Streptococcal pharyngitis: Secondary | ICD-10-CM | POA: Diagnosis not present

## 2015-05-05 DIAGNOSIS — J029 Acute pharyngitis, unspecified: Secondary | ICD-10-CM

## 2015-05-05 DIAGNOSIS — E119 Type 2 diabetes mellitus without complications: Secondary | ICD-10-CM | POA: Diagnosis not present

## 2015-05-05 LAB — POCT RAPID STREP A (OFFICE): RAPID STREP A SCREEN: NEGATIVE

## 2015-05-05 MED ORDER — PENICILLIN V POTASSIUM 500 MG PO TABS: 500.0000 mg | ORAL_TABLET | Freq: Three times a day (TID) | ORAL | Status: DC

## 2015-05-05 NOTE — Patient Instructions (Addendum)
Have eye exam faxed to us at [phone].  Weight is up 6 lbs. We opted to hold off on checking a1c and cholesterol until we have reversed this trend. Let's check in 4-5 months from now with labs a week before.   Presumed strep with known contact with strep (wife) Treat with penicillin for 10 days.  Follow up if do not improve with treatment.  This could also be a virus-which should also improve in 10 days If fevers above 102, shortness of breath, new symptoms, please return to care

## 2015-05-05 NOTE — Assessment & Plan Note (Addendum)
Weight up 6 lbs from last visit. Advised weight loss/back to gym/healthier eating. Follow up 4-5 months and repeat total chol, HDL, LDL as well as a1c at that time if or if not weight loss. Also urine micro/cr.

## 2015-05-05 NOTE — Progress Notes (Signed)
Tana ConchStephen Keirstin Musil, MD  Subjective:  Paul Paul is a 59 y.o. year old very pleasant male patient who presents with:  Sore throat/fever-new Symptoms started Monday including subjective fevers/chills/sweats. Temperature measured around 101. Body aches. Has sore throat. Wife with strep throat [facility]. Denies runny nose, nasal congestion, cough. Has been taking tylenol regularly which helps with fever and body aches  DIABETES Type II-diet/exercise controlled  Lab Results  Component Value Date   HGBA1C 6.9* 11/10/2014   HGBA1C 6.3 12/08/2013   HGBA1C 7.4* 11/01/2012  Medications taking and tolerating-none Blood Sugars per patient-fasting-continues to run 120 Regular Exercise-lacking lately   ROS- Denies Vision changes, feet or hand numbness/pain/tingling. Denies  Hypoglycemia symptoms (shaky, sweaty, hungry, weak anxious, tremor, palpitations, confusion, behavior change).   Past Medical History- pacemaker due to symptomatic bradycardia, ceebral aneurysm history, OSA on cpap, HLD  Medications- reviewed and updated Current Outpatient Prescriptions  Medication Sig Dispense Refill  . aspirin EC 81 MG tablet Take 81 mg by mouth daily.    . metoprolol succinate (TOPROL-XL) 100 MG 24 hr tablet Take 1 tablet (100 mg total) by mouth every morning. 90 tablet 2  . Multiple Vitamin (MULTIVITAMIN) tablet Take 1 tablet by mouth daily.     No current facility-administered medications for this visit.    Objective: BP 100/62 mmHg  Pulse 65  Temp(Src) 98.4 F (36.9 C)  Wt 265 lb (120.203 kg)  SpO2 93% Gen: NAD, resting comfortably HEENT: enlarged erythematous tonsils without exudate, turbinates normal, TM obscured by cerumen. Tender anterior lymphadenopathy CV: RRR no murmurs rubs or gallops Lungs: CTAB no crackles, wheeze, rhonchi Abdomen: soft/nontender/nondistended/normal bowel sounds.  Ext: no edema Skin: warm, dry, no rash Neuro: grossly normal, moves all extremities    Assessment/Plan:  Well controlled type 2 diabetes mellitus Weight up 6 lbs from last visit. Advised weight loss/back to gym/healthier eating. Follow up 4-5 months and repeat total chol, HDL, LDL as well as a1c at that time if or if not weight loss. Also urine micro/cr.    Sore throat Rapid strep negative. Modified centor 3 and close contact wife with confirmed strep. We will empirically treat with penicillin and follow up if not improvement. Discussed potential antibiotic resistance and side effects of antibiotics and fact this could be viral.   Return precautions advised  Future fasting Orders Placed This Encounter  Procedures  . Cholesterol, Total    Standing Status: Future     Number of Occurrences:      Standing Expiration Date: 05/04/2016  . HDL cholesterol    Standing Status: Future     Number of Occurrences:      Standing Expiration Date: 05/04/2016  . LDL cholesterol, direct    [place]    Standing Status: Future     Number of Occurrences:      Standing Expiration Date: 05/04/2016  . Hemoglobin A1c    [place]    Standing Status: Future     Number of Occurrences:      Standing Expiration Date: 05/04/2016  . Microalbumin / creatinine urine ratio    [place]    Standing Status: Future     Number of Occurrences:      Standing Expiration Date: 05/04/2016  . POC Rapid Strep A      Meds ordered this encounter  Medications  . penicillin v potassium (VEETID) 500 MG tablet    Sig: Take 1 tablet (500 mg total) by mouth 3 (three) times daily.  Dispense:  30 tablet    Refill:  0

## 2015-05-24 DIAGNOSIS — M25511 Pain in right shoulder: Secondary | ICD-10-CM | POA: Diagnosis not present

## 2015-06-08 ENCOUNTER — Telehealth: Payer: Self-pay | Admitting: Family Medicine

## 2015-06-08 ENCOUNTER — Other Ambulatory Visit (HOSPITAL_COMMUNITY): Payer: Self-pay | Admitting: Interventional Radiology

## 2015-06-08 DIAGNOSIS — I729 Aneurysm of unspecified site: Secondary | ICD-10-CM

## 2015-06-08 DIAGNOSIS — I671 Cerebral aneurysm, nonruptured: Secondary | ICD-10-CM

## 2015-06-08 NOTE — Telephone Encounter (Signed)
Referral entered  

## 2015-06-08 NOTE — Telephone Encounter (Signed)
Ok to enter?

## 2015-06-08 NOTE — Telephone Encounter (Signed)
Yes, [facility]

## 2015-06-08 NOTE — Telephone Encounter (Signed)
Pt call to ask for a referral to see Dr Kerby Noraony Deveshwar. He said he see him once a year for brain aneurysm.

## 2015-07-12 ENCOUNTER — Telehealth: Payer: Self-pay | Admitting: Internal Medicine

## 2015-07-12 NOTE — Telephone Encounter (Signed)
Appt made for 8/10  w/ device clinic.

## 2015-07-12 NOTE — Telephone Encounter (Signed)
New Message   Pt is calling to speak to Dr Ladona Ridgel or Rn,  Pt wants to have a pacer check before Aug 20th. Pt is having a procedure done and   He will be out of commission, His ORDER in EPIC is scheduled for 09/12/15.

## 2015-07-14 ENCOUNTER — Ambulatory Visit (INDEPENDENT_AMBULATORY_CARE_PROVIDER_SITE_OTHER): Payer: Commercial Managed Care - HMO | Admitting: *Deleted

## 2015-07-14 DIAGNOSIS — R001 Bradycardia, unspecified: Secondary | ICD-10-CM

## 2015-07-14 LAB — CUP PACEART INCLINIC DEVICE CHECK
Battery Voltage: 2.96 V
Brady Statistic RA Percent Paced: 91 %
Lead Channel Impedance Value: 425 Ohm
Lead Channel Pacing Threshold Amplitude: 0.75 V
Lead Channel Pacing Threshold Amplitude: 0.75 V
Lead Channel Pacing Threshold Amplitude: 1.125 V
Lead Channel Pacing Threshold Pulse Width: 0.4 ms
Lead Channel Pacing Threshold Pulse Width: 0.4 ms
Lead Channel Sensing Intrinsic Amplitude: 5 mV
Lead Channel Setting Pacing Amplitude: 1.375
Lead Channel Setting Pacing Amplitude: 2 V
Lead Channel Setting Pacing Pulse Width: 0.4 ms
Lead Channel Setting Sensing Sensitivity: 2 mV
MDC IDC MSMT BATTERY REMAINING LONGEVITY: 122.4 mo
MDC IDC MSMT LEADCHNL RA IMPEDANCE VALUE: 412.5 Ohm
MDC IDC MSMT LEADCHNL RA PACING THRESHOLD PULSEWIDTH: 0.4 ms
MDC IDC MSMT LEADCHNL RV SENSING INTR AMPL: 10.6 mV
MDC IDC PG SERIAL: 7250770
MDC IDC SESS DTM: 20160810145341
MDC IDC STAT BRADY RV PERCENT PACED: 0.1 %
Pulse Gen Model: 2210

## 2015-07-14 NOTE — Progress Notes (Signed)
Pacemaker check in clinic. Normal device function. Thresholds, sensing, impedances consistent with previous measurements. Device programmed to maximize longevity. No mode switch or high ventricular rates noted. Device programmed at appropriate safety margins. Histogram distribution appropriate for patient activity level. Device programmed to optimize intrinsic conduction. Estimated longevity 9.8-10.2 years. Patient will follow up with GT in 6 months.

## 2015-07-23 ENCOUNTER — Ambulatory Visit (HOSPITAL_COMMUNITY): Payer: Commercial Managed Care - HMO

## 2015-07-29 ENCOUNTER — Other Ambulatory Visit: Payer: Self-pay | Admitting: Radiology

## 2015-07-30 ENCOUNTER — Ambulatory Visit (HOSPITAL_COMMUNITY)
Admission: RE | Admit: 2015-07-30 | Discharge: 2015-07-30 | Disposition: A | Discharge status: Home / Self Care | Payer: Commercial Managed Care - HMO | Source: Ambulatory Visit | Attending: Interventional Radiology | Admitting: Interventional Radiology

## 2015-07-30 ENCOUNTER — Encounter (HOSPITAL_COMMUNITY): Payer: Self-pay

## 2015-07-30 ENCOUNTER — Other Ambulatory Visit (HOSPITAL_COMMUNITY): Payer: Self-pay | Admitting: Interventional Radiology

## 2015-07-30 DIAGNOSIS — Z7982 Long term (current) use of aspirin: Secondary | ICD-10-CM | POA: Diagnosis not present

## 2015-07-30 DIAGNOSIS — F419 Anxiety disorder, unspecified: Secondary | ICD-10-CM | POA: Insufficient documentation

## 2015-07-30 DIAGNOSIS — Z8249 Family history of ischemic heart disease and other diseases of the circulatory system: Secondary | ICD-10-CM | POA: Diagnosis not present

## 2015-07-30 DIAGNOSIS — I729 Aneurysm of unspecified site: Secondary | ICD-10-CM | POA: Insufficient documentation

## 2015-07-30 DIAGNOSIS — M1990 Unspecified osteoarthritis, unspecified site: Secondary | ICD-10-CM | POA: Insufficient documentation

## 2015-07-30 DIAGNOSIS — I671 Cerebral aneurysm, nonruptured: Secondary | ICD-10-CM | POA: Insufficient documentation

## 2015-07-30 DIAGNOSIS — K5900 Constipation, unspecified: Secondary | ICD-10-CM | POA: Insufficient documentation

## 2015-07-30 DIAGNOSIS — Z950 Presence of cardiac pacemaker: Secondary | ICD-10-CM | POA: Insufficient documentation

## 2015-07-30 DIAGNOSIS — R51 Headache: Secondary | ICD-10-CM | POA: Insufficient documentation

## 2015-07-30 DIAGNOSIS — E119 Type 2 diabetes mellitus without complications: Secondary | ICD-10-CM | POA: Diagnosis not present

## 2015-07-30 DIAGNOSIS — Z48812 Encounter for surgical aftercare following surgery on the circulatory system: Secondary | ICD-10-CM | POA: Insufficient documentation

## 2015-07-30 DIAGNOSIS — Z87891 Personal history of nicotine dependence: Secondary | ICD-10-CM | POA: Insufficient documentation

## 2015-07-30 DIAGNOSIS — G4730 Sleep apnea, unspecified: Secondary | ICD-10-CM | POA: Insufficient documentation

## 2015-07-30 LAB — APTT: aPTT: 28 seconds (ref 24–37)

## 2015-07-30 LAB — CBC
HEMATOCRIT: 46.1 % (ref 39.0–52.0)
HEMOGLOBIN: 16 g/dL (ref 13.0–17.0)
MCH: 30.9 pg (ref 26.0–34.0)
MCHC: 34.7 g/dL (ref 30.0–36.0)
MCV: 89 fL (ref 78.0–100.0)
Platelets: 240 10*3/uL (ref 150–400)
RBC: 5.18 MIL/uL (ref 4.22–5.81)
RDW: 13 % (ref 11.5–15.5)
WBC: 6.1 10*3/uL (ref 4.0–10.5)

## 2015-07-30 LAB — BASIC METABOLIC PANEL
ANION GAP: 8 (ref 5–15)
BUN: 11 mg/dL (ref 6–20)
CALCIUM: 9.4 mg/dL (ref 8.9–10.3)
CO2: 25 mmol/L (ref 22–32)
Chloride: 102 mmol/L (ref 101–111)
Creatinine, Ser: 1.06 mg/dL (ref 0.61–1.24)
GLUCOSE: 140 mg/dL — AB (ref 65–99)
POTASSIUM: 4 mmol/L (ref 3.5–5.1)
Sodium: 135 mmol/L (ref 135–145)

## 2015-07-30 LAB — PROTIME-INR
INR: 1.01 (ref 0.00–1.49)
PROTHROMBIN TIME: 13.5 s (ref 11.6–15.2)

## 2015-07-30 MED ORDER — IOHEXOL 300 MG/ML  SOLN
150.0000 mL | Freq: Once | INTRAMUSCULAR | Status: DC | PRN
  Administered 2015-07-30: 80 mL via INTRA_ARTERIAL
  Filled 2015-07-30: qty 150

## 2015-07-30 MED ORDER — HEPARIN SOD (PORK) LOCK FLUSH 100 UNIT/ML IV SOLN
INTRAVENOUS | Status: AC
  Filled 2015-07-30: qty 20

## 2015-07-30 MED ORDER — SODIUM CHLORIDE 0.9 % IV SOLN: INTRAVENOUS | Status: DC

## 2015-07-30 MED ORDER — FENTANYL CITRATE (PF) 100 MCG/2ML IJ SOLN
INTRAMUSCULAR | Status: AC
  Filled 2015-07-30: qty 2

## 2015-07-30 MED ORDER — LIDOCAINE HCL 1 % IJ SOLN
INTRAMUSCULAR | Status: AC
  Filled 2015-07-30: qty 20

## 2015-07-30 MED ORDER — SODIUM CHLORIDE 0.9 % IV SOLN: Freq: Once | INTRAVENOUS | Status: DC

## 2015-07-30 MED ORDER — MIDAZOLAM HCL 2 MG/2ML IJ SOLN
INTRAMUSCULAR | Status: AC
  Filled 2015-07-30: qty 2

## 2015-07-30 MED ORDER — HEPARIN SOD (PORK) LOCK FLUSH 100 UNIT/ML IV SOLN
INTRAVENOUS | Status: AC | PRN
  Administered 2015-07-30: 1000 [IU] via INTRAVENOUS

## 2015-07-30 MED ORDER — FENTANYL CITRATE (PF) 100 MCG/2ML IJ SOLN
INTRAMUSCULAR | Status: AC | PRN
Start: 2015-07-30 — End: 2015-07-30
  Administered 2015-07-30 (×2): 25 ug via INTRAVENOUS

## 2015-07-30 MED ORDER — MIDAZOLAM HCL 2 MG/2ML IJ SOLN
INTRAMUSCULAR | Status: AC | PRN
  Administered 2015-07-30: 1 mg via INTRAVENOUS

## 2015-07-30 NOTE — Procedures (Signed)
S/P 4 vessel cerebral arteriogram. Rt CFa approach. Findings. 1.Obliterated Rt MCA aneurysm .Minimal neck remnant  Stable

## 2015-07-30 NOTE — H&P (Signed)
Chief Complaint: Patient was seen in consultation today for R MCA aneurysm at the request of Dr Rubin Payor  Referring Physician(s): Benjiman Core  History of Present Illness: Paul Paul is a 59 y.o. male   Known to Neuro Interventional Radiology R Middle Cerebral Artery Aneurysm coiling stent 2013 Has followed with Dr Corliss Skains since then Pt denies neurologic sxs Getting along well Scheduled for follow up arteriogram Previous evaluation/arteriogram 07/2014: IMPRESSION: Endovascularly completely obliterated right MCA trifurcation region aneurysm, without angiographic evidence of coil compaction or recanalization.  The previously seen neck remnant appears significantly decreased compared to the most recent angiographic examination.   Past Medical History  Diagnosis Date  . HYPERTENSION 12/10/2007  . Diabetes mellitus     now under control  . Pacemaker   . Bradycardia   . Dysrhythmia     BRADYCARDIA REQUIRING PACEMAKER .FOLLOWED BY GREGG TAYLOR  . Sleep apnea     CPAP.NOT WEARING NOW.NEEDS REPLACEMENT PIECE  . Arthritis   . Shoulder pain     L SHOULDER. RECENT FALL  . Shortness of breath     WITH EXERTION  . Anxiety   . Bell palsy     > 30 years  . Constipation   . Cerebral aneurysm     Past Surgical History  Procedure Laterality Date  . Septal deviation    . Knee surgery      left; arthoscopic  . Insert / replace / remove pacemaker    . Rotator cuff surgery  02/2012  . Cardiac catheterization  07/06/11    normal  . Fracture surgery      left foot    Allergies: Losartan potassium-hctz; Prednisone; Doxycycline; and Lisinopril  Medications: Prior to Admission medications   Medication Sig Start Date End Date Taking? Authorizing Provider  aspirin EC 81 MG tablet Take 81 mg by mouth daily.   Yes Historical Provider, MD  metoprolol succinate (TOPROL-XL) 100 MG 24 hr tablet Take 1 tablet (100 mg total) by mouth every morning. 03/01/15 02/29/16 Yes  Shelva Majestic, MD  Multiple Vitamin (MULTIVITAMIN) tablet Take 1 tablet by mouth daily.    Historical Provider, MD  penicillin v potassium (VEETID) 500 MG tablet Take 1 tablet (500 mg total) by mouth 3 (three) times daily. Patient not taking: Reported on 07/14/2015 05/05/15   Shelva Majestic, MD     Family History  Problem Relation Age of Onset  . Heart attack Mother     died 5, smoker  . Heart disease Mother   . Diabetes Mother   . Heart attack Father     died 42, smoker  . Heart disease Father     Social History   Social History  . Marital Status: Married    Spouse Name: N/A  . Number of Children: N/A  . Years of Education: N/A   Social History Main Topics  . Smoking status: Former Smoker -- 1.00 packs/day for 20 years    Types: Cigarettes    Quit date: 07/05/2011  . Smokeless tobacco: Never Used  . Alcohol Use: No  . Drug Use: No  . Sexual Activity: Not Asked   Other Topics Concern  . None   Social History Narrative   Married over 30 years in 2015. 1 step son. 1 grandson.       Disabled from brain aneurysm, pacemaker. Retired from [employer] years.       Hobbies: grandson, exercise at [facility]     Review of Systems:  A 12 point ROS discussed and pertinent positives are indicated in the HPI above.  All other systems are negative.  Review of Systems  Constitutional: Negative for activity change, appetite change and unexpected weight change.  HENT: Negative for hearing loss, tinnitus and voice change.   Eyes: Negative for visual disturbance.  Respiratory: Negative for cough and shortness of breath.   Gastrointestinal: Negative for abdominal pain.  Neurological: Negative for dizziness, tremors, seizures, syncope, facial asymmetry, speech difficulty, weakness, light-headedness, numbness and headaches.  Psychiatric/Behavioral: Negative for behavioral problems and confusion.    Vital Signs: BP 140/75 mmHg  Pulse 78  Temp(Src) 98 F (36.7 C) (Oral)  Resp  18  Ht  (1.905 m)  Wt 255 lb (115.667 kg)  BMI 31.87 kg/m2  SpO2 97%  Physical Exam  Constitutional: He is oriented to person, place, and time. He appears well-nourished.  Eyes: EOM are normal.  Neck: Normal range of motion.  Cardiovascular: Normal rate, regular rhythm and normal heart sounds.   No murmur heard. Pulmonary/Chest: Effort normal. He has no wheezes.  Abdominal: Soft. Bowel sounds are normal. There is no tenderness.  Musculoskeletal: Normal range of motion.  Neurological: He is alert and oriented to person, place, and time.  Skin: Skin is warm and dry.  Psychiatric: He has a normal mood and affect. His behavior is normal. Thought content normal.  Nursing note and vitals reviewed.   Mallampati Score:  MD Evaluation Airway: WNL Heart: WNL Abdomen: WNL Chest/ Lungs: WNL ASA  Classification: 2 Mallampati/Airway Score: Two  Imaging: No results found.  Labs:  CBC:  Recent Labs  11/10/14 1030 07/30/15 0719  WBC 6.6 6.1  HGB 16.9 16.0  HCT 50.6 46.1  PLT 296.0 240    COAGS:  Recent Labs  07/30/15 0719  INR 1.01  APTT 28    BMP:  Recent Labs  11/10/14 1030 07/30/15 0719  NA 137 135  K 4.2 4.0  CL 105 102  CO2 23 25  GLUCOSE 115* 140*  BUN 13 11  CALCIUM 9.7 9.4  CREATININE 1.0 1.06  GFRNONAA  --  >60  GFRAA  --  >60    LIVER FUNCTION TESTS:  Recent Labs  11/10/14 1030  BILITOT 0.9  AST 19  ALT 18  ALKPHOS 34*  PROT 7.8  ALBUMIN 4.1    TUMOR MARKERS: No results for input(s): AFPTM, CEA, CA199, CHROMGRNA in the last 8760 hours.  Assessment and Plan:  Previous R MCA aneurysm coil/stent 2013 Follow up cerebral arteriogram scheduled for today Risks and Benefits discussed with the patient including, but not limited to bleeding, infection, vascular injury, contrast induced renal failure, stroke or even death. All of the patient's questions were answered, patient is agreeable to proceed. Consent signed and in  chart.  Thank you for this interesting consult.  I greatly enjoyed meeting RYETT HAMMAN and look forward to participating in their care.  A copy of this report was sent to the requesting provider on this date.  Signed: Jovontae Banko A 07/30/2015, 8:42 AM   I spent a total of  30 Minutes   in face to face in clinical consultation, greater than 50% of which was counseling/coordinating care for cerebral arteriogram

## 2015-07-30 NOTE — Discharge Instructions (Signed)
Cerebral Angiogram, Care After °Refer to this sheet in the next few weeks. These instructions provide you with information on caring for yourself after your procedure. Your health care provider may also give you more specific instructions. Your treatment has been planned according to current medical practices, but problems sometimes occur. Call your health care provider if you have any problems or questions after your procedure. °WHAT TO EXPECT AFTER THE PROCEDURE °After your procedure, it is typical to have the following:  °· Small lump at the insertion site. °· Tenderness or bruising at the insertion site. °· Mild headache. °HOME CARE INSTRUCTIONS °· Rest at home for the first day after your procedure. °· Do not drive or operate heavy machinery as directed by your health care provider. °· Do not exercise as directed by your health care provider. °· Do not lift objects more than 10 pounds as directed by your health care provider. °· Take medicines only as directed by your health care provider. °· Follow your health care provider's instructions on: °¨ Incision care. °¨ Bandage (dressing) changes and removal. °· Check daily for signs of infection at the insertion site, such as redness, swelling, or discharge. °· Drink enough water to keep your urine clear or pale yellow. This will help flush out the contrast dye. °· Keep all follow-up visits as directed by your health care provider. This is important. °SEEK MEDICAL CARE IF: °· You have a fever. °· The skin at the insertion site begins to separate. °· You have drainage, redness, swelling, or pain at the insertion site. °· You are dizzy. °· You have a rash. °· You are itchy. °· You have difficulty urinating. °SEEK IMMEDIATE MEDICAL CARE IF: °· You have weakness in the face, arms, or legs.   °· You have difficulty talking or swallowing.   °· You have vision problems.   °· You have sudden swelling or pain at the insertion site. °· You have a rash.   °· You have  difficulty using the arm or leg in which the catheter was inserted.   °· You have chest pain. °· You have difficulty breathing. °Document Released: 04/06/2014 Document Reviewed: 12/03/2013 °[employer]® Patient Information ©2015 [employer]. This information is not intended to replace advice given to you by your health care provider. Make sure you discuss any questions you have with your health care provider. ° °

## 2015-08-06 ENCOUNTER — Encounter: Payer: Self-pay | Admitting: Internal Medicine

## 2015-08-10 ENCOUNTER — Other Ambulatory Visit: Payer: Self-pay | Admitting: Orthopedic Surgery

## 2015-08-10 DIAGNOSIS — M25511 Pain in right shoulder: Secondary | ICD-10-CM

## 2015-08-17 ENCOUNTER — Ambulatory Visit
Admission: RE | Admit: 2015-08-17 | Discharge: 2015-08-17 | Disposition: A | Discharge status: Home / Self Care | Payer: Commercial Managed Care - HMO | Source: Ambulatory Visit | Attending: Orthopedic Surgery | Admitting: Orthopedic Surgery

## 2015-08-17 DIAGNOSIS — M25511 Pain in right shoulder: Secondary | ICD-10-CM

## 2015-08-17 MED ORDER — IOHEXOL 180 MG/ML  SOLN
15.0000 mL | Freq: Once | INTRAMUSCULAR | Status: DC | PRN
  Administered 2015-08-17: 15 mL via INTRA_ARTICULAR

## 2015-08-19 ENCOUNTER — Other Ambulatory Visit (HOSPITAL_COMMUNITY): Payer: Self-pay | Admitting: Orthopedic Surgery

## 2015-08-19 DIAGNOSIS — M25511 Pain in right shoulder: Secondary | ICD-10-CM | POA: Diagnosis not present

## 2015-08-23 ENCOUNTER — Telehealth: Payer: Self-pay | Admitting: Family Medicine

## 2015-08-23 ENCOUNTER — Encounter (HOSPITAL_COMMUNITY): Payer: Self-pay | Admitting: *Deleted

## 2015-08-23 DIAGNOSIS — Z9889 Other specified postprocedural states: Secondary | ICD-10-CM

## 2015-08-23 MED ORDER — CEFAZOLIN SODIUM-DEXTROSE 2-3 GM-% IV SOLR
2.0000 g | INTRAVENOUS | Status: AC
  Administered 2015-08-24: 2 g via INTRAVENOUS
  Filled 2015-08-23: qty 50

## 2015-08-23 NOTE — Telephone Encounter (Signed)
Referral entered  

## 2015-08-23 NOTE — Telephone Encounter (Signed)
Yes. I assume they need PT and OT? Or just PT? Not sure- but fine with both

## 2015-08-23 NOTE — Telephone Encounter (Signed)
Patient called stating that he has surgery schedule for tomorrow at [facility] and needs a referral for rehabilitation.Marland Kitchen

## 2015-08-23 NOTE — Telephone Encounter (Signed)
Are you ok with this referral?

## 2015-08-24 ENCOUNTER — Ambulatory Visit (HOSPITAL_COMMUNITY)
Admission: RE | Admit: 2015-08-24 | Discharge: 2015-08-24 | Disposition: A | Discharge status: Home / Self Care | Payer: Commercial Managed Care - HMO | Source: Ambulatory Visit | Attending: Orthopedic Surgery | Admitting: Orthopedic Surgery

## 2015-08-24 ENCOUNTER — Ambulatory Visit (HOSPITAL_COMMUNITY): Payer: Commercial Managed Care - HMO | Admitting: Certified Registered Nurse Anesthetist

## 2015-08-24 ENCOUNTER — Encounter (HOSPITAL_COMMUNITY): Payer: Self-pay | Admitting: *Deleted

## 2015-08-24 ENCOUNTER — Encounter (HOSPITAL_COMMUNITY)
Admission: RE | Disposition: A | Discharge status: Home / Self Care | Payer: Self-pay | Source: Ambulatory Visit | Attending: Orthopedic Surgery

## 2015-08-24 DIAGNOSIS — M7551 Bursitis of right shoulder: Secondary | ICD-10-CM | POA: Insufficient documentation

## 2015-08-24 DIAGNOSIS — X58XXXA Exposure to other specified factors, initial encounter: Secondary | ICD-10-CM | POA: Diagnosis not present

## 2015-08-24 DIAGNOSIS — Z6832 Body mass index (BMI) 32.0-32.9, adult: Secondary | ICD-10-CM | POA: Diagnosis not present

## 2015-08-24 DIAGNOSIS — M7591 Shoulder lesion, unspecified, right shoulder: Secondary | ICD-10-CM | POA: Diagnosis not present

## 2015-08-24 DIAGNOSIS — Z792 Long term (current) use of antibiotics: Secondary | ICD-10-CM | POA: Diagnosis not present

## 2015-08-24 DIAGNOSIS — Z7982 Long term (current) use of aspirin: Secondary | ICD-10-CM | POA: Insufficient documentation

## 2015-08-24 DIAGNOSIS — S43431A Superior glenoid labrum lesion of right shoulder, initial encounter: Secondary | ICD-10-CM | POA: Diagnosis not present

## 2015-08-24 DIAGNOSIS — M24111 Other articular cartilage disorders, right shoulder: Secondary | ICD-10-CM | POA: Diagnosis not present

## 2015-08-24 DIAGNOSIS — M75121 Complete rotator cuff tear or rupture of right shoulder, not specified as traumatic: Secondary | ICD-10-CM | POA: Diagnosis not present

## 2015-08-24 DIAGNOSIS — E119 Type 2 diabetes mellitus without complications: Secondary | ICD-10-CM | POA: Diagnosis not present

## 2015-08-24 DIAGNOSIS — I10 Essential (primary) hypertension: Secondary | ICD-10-CM | POA: Diagnosis not present

## 2015-08-24 DIAGNOSIS — Z79899 Other long term (current) drug therapy: Secondary | ICD-10-CM | POA: Insufficient documentation

## 2015-08-24 DIAGNOSIS — G8918 Other acute postprocedural pain: Secondary | ICD-10-CM | POA: Diagnosis not present

## 2015-08-24 DIAGNOSIS — Z950 Presence of cardiac pacemaker: Secondary | ICD-10-CM | POA: Diagnosis not present

## 2015-08-24 DIAGNOSIS — G4730 Sleep apnea, unspecified: Secondary | ICD-10-CM | POA: Insufficient documentation

## 2015-08-24 DIAGNOSIS — Z87891 Personal history of nicotine dependence: Secondary | ICD-10-CM | POA: Diagnosis not present

## 2015-08-24 DIAGNOSIS — M1990 Unspecified osteoarthritis, unspecified site: Secondary | ICD-10-CM | POA: Diagnosis not present

## 2015-08-24 DIAGNOSIS — M75101 Unspecified rotator cuff tear or rupture of right shoulder, not specified as traumatic: Secondary | ICD-10-CM | POA: Diagnosis not present

## 2015-08-24 DIAGNOSIS — M778 Other enthesopathies, not elsewhere classified: Secondary | ICD-10-CM | POA: Insufficient documentation

## 2015-08-24 DIAGNOSIS — M7521 Bicipital tendinitis, right shoulder: Secondary | ICD-10-CM | POA: Diagnosis not present

## 2015-08-24 HISTORY — DX: Adverse effect of unspecified anesthetic, initial encounter: T41.45XA

## 2015-08-24 HISTORY — PX: SHOULDER ARTHROSCOPY WITH SUBACROMIAL DECOMPRESSION, ROTATOR CUFF REPAIR AND BICEP TENDON REPAIR: SHX5687

## 2015-08-24 HISTORY — DX: Cerebral aneurysm, nonruptured: I67.1

## 2015-08-24 LAB — CBC
HCT: 47.8 % (ref 39.0–52.0)
Hemoglobin: 16.3 g/dL (ref 13.0–17.0)
MCH: 30.8 pg (ref 26.0–34.0)
MCHC: 34.1 g/dL (ref 30.0–36.0)
MCV: 90.2 fL (ref 78.0–100.0)
PLATELETS: 241 10*3/uL (ref 150–400)
RBC: 5.3 MIL/uL (ref 4.22–5.81)
RDW: 13.1 % (ref 11.5–15.5)
WBC: 6.2 10*3/uL (ref 4.0–10.5)

## 2015-08-24 LAB — BASIC METABOLIC PANEL
ANION GAP: 7 (ref 5–15)
BUN: 11 mg/dL (ref 6–20)
CALCIUM: 9.3 mg/dL (ref 8.9–10.3)
CO2: 27 mmol/L (ref 22–32)
Chloride: 103 mmol/L (ref 101–111)
Creatinine, Ser: 1.06 mg/dL (ref 0.61–1.24)
Glucose, Bld: 137 mg/dL — ABNORMAL HIGH (ref 65–99)
POTASSIUM: 4.6 mmol/L (ref 3.5–5.1)
SODIUM: 137 mmol/L (ref 135–145)

## 2015-08-24 SURGERY — SHOULDER ARTHROSCOPY WITH SUBACROMIAL DECOMPRESSION, ROTATOR CUFF REPAIR AND BICEP TENDON REPAIR
Anesthesia: General | Site: Shoulder | Laterality: Right

## 2015-08-24 MED ORDER — METHOCARBAMOL 500 MG PO TABS
ORAL_TABLET | ORAL | Status: AC
  Filled 2015-08-24: qty 1

## 2015-08-24 MED ORDER — BUPIVACAINE-EPINEPHRINE (PF) 0.5% -1:200000 IJ SOLN
INTRAMUSCULAR | Status: DC | PRN
  Administered 2015-08-24: 30 mL via PERINEURAL

## 2015-08-24 MED ORDER — EPINEPHRINE HCL 1 MG/ML IJ SOLN
INTRAMUSCULAR | Status: AC
  Filled 2015-08-24: qty 1

## 2015-08-24 MED ORDER — PHENYLEPHRINE HCL 10 MG/ML IJ SOLN
10.0000 mg | INTRAVENOUS | Status: DC | PRN
  Administered 2015-08-24: 30 ug/min via INTRAVENOUS

## 2015-08-24 MED ORDER — LACTATED RINGERS IV SOLN
INTRAVENOUS | Status: DC | PRN
  Administered 2015-08-24: 07:00:00 via INTRAVENOUS

## 2015-08-24 MED ORDER — FENTANYL CITRATE (PF) 100 MCG/2ML IJ SOLN
INTRAMUSCULAR | Status: DC | PRN
  Administered 2015-08-24 (×2): 50 ug via INTRAVENOUS

## 2015-08-24 MED ORDER — PROPOFOL 10 MG/ML IV BOLUS
INTRAVENOUS | Status: DC | PRN
  Administered 2015-08-24: 50 mg via INTRAVENOUS
  Administered 2015-08-24: 200 mg via INTRAVENOUS

## 2015-08-24 MED ORDER — SUCCINYLCHOLINE CHLORIDE 20 MG/ML IJ SOLN
INTRAMUSCULAR | Status: AC
  Filled 2015-08-24: qty 1

## 2015-08-24 MED ORDER — ONDANSETRON HCL 4 MG/2ML IJ SOLN
INTRAMUSCULAR | Status: AC
  Filled 2015-08-24: qty 2

## 2015-08-24 MED ORDER — MIDAZOLAM HCL 2 MG/2ML IJ SOLN
INTRAMUSCULAR | Status: AC
  Filled 2015-08-24: qty 4

## 2015-08-24 MED ORDER — GLYCOPYRROLATE 0.2 MG/ML IJ SOLN
INTRAMUSCULAR | Status: AC
  Filled 2015-08-24: qty 3

## 2015-08-24 MED ORDER — SODIUM CHLORIDE 0.9 % IR SOLN
Status: DC | PRN
  Administered 2015-08-24 (×2): 3000 mL

## 2015-08-24 MED ORDER — EPINEPHRINE HCL 1 MG/ML IJ SOLN
INTRAMUSCULAR | Status: DC | PRN
  Administered 2015-08-24: .1 mg via INTRAVENOUS

## 2015-08-24 MED ORDER — NEOSTIGMINE METHYLSULFATE 10 MG/10ML IV SOLN
INTRAVENOUS | Status: DC | PRN
  Administered 2015-08-24: 3 mg via INTRAVENOUS

## 2015-08-24 MED ORDER — FENTANYL CITRATE (PF) 250 MCG/5ML IJ SOLN
INTRAMUSCULAR | Status: AC
  Filled 2015-08-24: qty 5

## 2015-08-24 MED ORDER — BUPIVACAINE HCL (PF) 0.25 % IJ SOLN
INTRAMUSCULAR | Status: AC
  Filled 2015-08-24: qty 30

## 2015-08-24 MED ORDER — OXYCODONE-ACETAMINOPHEN 5-325 MG PO TABS
1.0000 | ORAL_TABLET | Freq: Four times a day (QID) | ORAL | Status: DC | PRN
  Administered 2015-08-24: 1 via ORAL

## 2015-08-24 MED ORDER — METHOCARBAMOL 500 MG PO TABS
500.0000 mg | ORAL_TABLET | Freq: Three times a day (TID) | ORAL | Status: DC
  Administered 2015-08-24: 500 mg via ORAL

## 2015-08-24 MED ORDER — CHLORHEXIDINE GLUCONATE 4 % EX LIQD: 60.0000 mL | Freq: Once | CUTANEOUS | Status: DC

## 2015-08-24 MED ORDER — LIDOCAINE HCL (CARDIAC) 20 MG/ML IV SOLN
INTRAVENOUS | Status: DC | PRN
  Administered 2015-08-24: 60 mg via INTRAVENOUS

## 2015-08-24 MED ORDER — SUCCINYLCHOLINE CHLORIDE 20 MG/ML IJ SOLN
INTRAMUSCULAR | Status: DC | PRN
  Administered 2015-08-24: 120 mg via INTRAVENOUS

## 2015-08-24 MED ORDER — OXYCODONE-ACETAMINOPHEN 5-325 MG PO TABS: 1.0000 | ORAL_TABLET | Freq: Four times a day (QID) | ORAL | Status: DC | PRN

## 2015-08-24 MED ORDER — PHENYLEPHRINE HCL 10 MG/ML IJ SOLN
INTRAMUSCULAR | Status: DC | PRN
  Administered 2015-08-24: 80 ug via INTRAVENOUS

## 2015-08-24 MED ORDER — SODIUM CHLORIDE 0.9 % IJ SOLN
INTRAMUSCULAR | Status: DC | PRN
  Administered 2015-08-24: 30 mL

## 2015-08-24 MED ORDER — OXYCODONE-ACETAMINOPHEN 5-325 MG PO TABS
ORAL_TABLET | ORAL | Status: AC
  Filled 2015-08-24: qty 1

## 2015-08-24 MED ORDER — GLYCOPYRROLATE 0.2 MG/ML IJ SOLN
INTRAMUSCULAR | Status: DC | PRN
  Administered 2015-08-24: 0.4 mg via INTRAVENOUS

## 2015-08-24 MED ORDER — ROCURONIUM BROMIDE 100 MG/10ML IV SOLN
INTRAVENOUS | Status: DC | PRN
  Administered 2015-08-24: 30 mg via INTRAVENOUS

## 2015-08-24 MED ORDER — ONDANSETRON HCL 4 MG/2ML IJ SOLN
INTRAMUSCULAR | Status: DC | PRN
  Administered 2015-08-24: 4 mg via INTRAVENOUS

## 2015-08-24 MED ORDER — MIDAZOLAM HCL 5 MG/5ML IJ SOLN
INTRAMUSCULAR | Status: DC | PRN
  Administered 2015-08-24 (×2): 1 mg via INTRAVENOUS

## 2015-08-24 MED ORDER — METHOCARBAMOL 500 MG PO TABS: 500.0000 mg | ORAL_TABLET | Freq: Four times a day (QID) | ORAL | Status: DC

## 2015-08-24 MED ORDER — PHENYLEPHRINE 40 MCG/ML (10ML) SYRINGE FOR IV PUSH (FOR BLOOD PRESSURE SUPPORT)
PREFILLED_SYRINGE | INTRAVENOUS | Status: AC
  Filled 2015-08-24: qty 10

## 2015-08-24 SURGICAL SUPPLY — 71 items
ANCHOR CORKSCREW BIO 5.5 FT (Anchor) ×6 IMPLANT
BENZOIN TINCTURE PRP APPL 2/3 (GAUZE/BANDAGES/DRESSINGS) ×3 IMPLANT
BLADE CUTTER GATOR 3.5 (BLADE) ×3 IMPLANT
BLADE GREAT WHITE 4.2 (BLADE) IMPLANT
BLADE GREAT WHITE 4.2MM (BLADE)
BLADE SURG 11 STRL SS (BLADE) ×3 IMPLANT
BUR GATOR 2.9 (BURR) IMPLANT
BUR GATOR 2.9MM (BURR)
BUR OVAL 6.0 (BURR) ×3 IMPLANT
CANNULA SHOULDER 7CM (CANNULA) IMPLANT
CARTRIDGE CURVETEK MED (MISCELLANEOUS) IMPLANT
CARTRIDGE CURVETEK XLRG (MISCELLANEOUS) IMPLANT
CLOSURE WOUND 1/2 X4 (GAUZE/BANDAGES/DRESSINGS) ×1
COVER SURGICAL LIGHT HANDLE (MISCELLANEOUS) ×3 IMPLANT
DRAPE INCISE IOBAN 66X45 STRL (DRAPES) ×6 IMPLANT
DRAPE STERI 35X30 U-POUCH (DRAPES) ×3 IMPLANT
DRAPE U-SHAPE 47X51 STRL (DRAPES) ×6 IMPLANT
DRSG MEPILEX BORDER 4X4 (GAUZE/BANDAGES/DRESSINGS) ×6 IMPLANT
DRSG MEPILEX BORDER 4X8 (GAUZE/BANDAGES/DRESSINGS) ×3 IMPLANT
DRSG PAD ABDOMINAL 8X10 ST (GAUZE/BANDAGES/DRESSINGS) ×9 IMPLANT
DURAPREP 26ML APPLICATOR (WOUND CARE) ×3 IMPLANT
ELECT REM PT RETURN 9FT ADLT (ELECTROSURGICAL) ×3
ELECTRODE REM PT RTRN 9FT ADLT (ELECTROSURGICAL) ×1 IMPLANT
FILTER STRAW FLUID ASPIR (MISCELLANEOUS) ×3 IMPLANT
GAUZE SPONGE 4X4 12PLY STRL (GAUZE/BANDAGES/DRESSINGS) ×3 IMPLANT
GAUZE XEROFORM 1X8 LF (GAUZE/BANDAGES/DRESSINGS) ×3 IMPLANT
GLOVE BIO SURGEON ST LM GN SZ9 (GLOVE) ×3 IMPLANT
GLOVE BIOGEL PI IND STRL 8 (GLOVE) ×1 IMPLANT
GLOVE BIOGEL PI INDICATOR 8 (GLOVE) ×2
GLOVE SURG ORTHO 8.0 STRL STRW (GLOVE) ×3 IMPLANT
GOWN STRL REUS W/ TWL LRG LVL3 (GOWN DISPOSABLE) ×3 IMPLANT
GOWN STRL REUS W/TWL LRG LVL3 (GOWN DISPOSABLE) ×6
KIT BASIN OR (CUSTOM PROCEDURE TRAY) ×3 IMPLANT
KIT BIO-TENODESIS 3X8 DISP (MISCELLANEOUS) ×2
KIT INSRT BABSR STRL DISP BTN (MISCELLANEOUS) ×1 IMPLANT
KIT ROOM TURNOVER OR (KITS) ×3 IMPLANT
MANIFOLD NEPTUNE II (INSTRUMENTS) ×3 IMPLANT
NDL SUT 6 .5 CRC .975X.05 MAYO (NEEDLE) ×1 IMPLANT
NEEDLE HYPO 25X1 1.5 SAFETY (NEEDLE) ×3 IMPLANT
NEEDLE MAYO TAPER (NEEDLE) ×2
NEEDLE SPNL 18GX3.5 QUINCKE PK (NEEDLE) ×3 IMPLANT
NS IRRIG 1000ML POUR BTL (IV SOLUTION) ×18 IMPLANT
PACK SHOULDER (CUSTOM PROCEDURE TRAY) ×3 IMPLANT
PAD ARMBOARD 7.5X6 YLW CONV (MISCELLANEOUS) ×6 IMPLANT
PUSHLOCK PEEK 4.5X24 (Orthopedic Implant) ×6 IMPLANT
SCREW TENODESIS BIOCOMP 7MM (Screw) ×3 IMPLANT
SET ARTHROSCOPY TUBING (MISCELLANEOUS) ×2
SET ARTHROSCOPY TUBING LN (MISCELLANEOUS) ×1 IMPLANT
SLING ARM IMMOBILIZER LRG (SOFTGOODS) ×3 IMPLANT
SPONGE LAP 4X18 X RAY DECT (DISPOSABLE) ×6 IMPLANT
STRIP CLOSURE SKIN 1/2X4 (GAUZE/BANDAGES/DRESSINGS) ×2 IMPLANT
SUCTION FRAZIER TIP 10 FR DISP (SUCTIONS) ×3 IMPLANT
SUT ETHILON 3 0 PS 1 (SUTURE) ×6 IMPLANT
SUT FIBERWIRE 2-0 18 17.9 3/8 (SUTURE) ×6
SUT PROLENE 3 0 PS 2 (SUTURE) ×3 IMPLANT
SUT VIC AB 0 CT1 27 (SUTURE) ×4
SUT VIC AB 0 CT1 27XBRD ANBCTR (SUTURE) ×2 IMPLANT
SUT VIC AB 1 CT1 27 (SUTURE) ×2
SUT VIC AB 1 CT1 27XBRD ANBCTR (SUTURE) ×1 IMPLANT
SUT VIC AB 2-0 CT1 27 (SUTURE) ×6
SUT VIC AB 2-0 CT1 TAPERPNT 27 (SUTURE) ×3 IMPLANT
SUT VICRYL 0 UR6 27IN ABS (SUTURE) IMPLANT
SUTURE FIBERWR 2-0 18 17.9 3/8 (SUTURE) ×2 IMPLANT
SYR 20CC LL (SYRINGE) ×6 IMPLANT
SYR 3ML LL SCALE MARK (SYRINGE) ×3 IMPLANT
SYR TB 1ML LUER SLIP (SYRINGE) ×3 IMPLANT
TOWEL OR 17X24 6PK STRL BLUE (TOWEL DISPOSABLE) ×3 IMPLANT
TOWEL OR 17X26 10 PK STRL BLUE (TOWEL DISPOSABLE) ×3 IMPLANT
TOWEL OR 17X26 4PK STRL BLUE (TOWEL DISPOSABLE) ×3 IMPLANT
WAND HAND CNTRL MULTIVAC 90 (MISCELLANEOUS) ×3 IMPLANT
WATER STERILE IRR 1000ML POUR (IV SOLUTION) ×3 IMPLANT

## 2015-08-24 NOTE — Transfer of Care (Signed)
Immediate Anesthesia Transfer of Care Note  Patient: Paul Paul  Procedure(s) Performed: Procedure(s) with comments: RIGHT SHOULDER DIAGNOSTIC OPERATIVE ARTHROSCOPY WITH DEBRIDEMENT, SUBACROMIAL DECOMPRESSION, MIMI-OPEN ROTATOR CUFF TEAR REPAIR AND POSSIBLE  BICEPS TENODESIS.   (Right) - RIGHT SHOULDER DIAGNOSTIC OPERATIVE ARTHROSCOPY, DEBRIDEMENT, SUBACROMIAL DECOMPRESSION, MINI-OPEN ROTATOR CUFF TEAR REPAIR, POSSIBLE BICEPS TENODESIS.  Patient Location: PACU  Anesthesia Type:General  Level of Consciousness: awake, alert  and oriented  Airway & Oxygen Therapy: Patient Spontanous Breathing and Patient connected to nasal cannula oxygen  Post-op Assessment: Report given to RN and Post -op Vital signs reviewed and stable  Post vital signs: Reviewed and stable  Last Vitals:  Filed Vitals:   08/24/15 0606  BP: 126/73  Pulse: 60  Temp: 36.8 C  Resp: 20    Complications: No apparent anesthesia complications

## 2015-08-24 NOTE — Brief Op Note (Signed)
08/24/2015  10:39 AM  PATIENT:  Paul Paul  59 y.o. male  PRE-OPERATIVE DIAGNOSIS:  RIGHT SHOULDER BURSITIS, ROTATOR CUFF TEAR, BICEPS TENDONITIS  POST-OPERATIVE DIAGNOSIS:  RIGHT SHOULDER BURSITIS, ROTATOR CUFF TEAR, BICEPS TENDONITIS  PROCEDURE:  Procedure(s): RIGHT SHOULDER DIAGNOSTIC OPERATIVE ARTHROSCOPY WITH DEBRIDEMENT, SUBACROMIAL DECOMPRESSION, MIMI-OPEN ROTATOR CUFF TEAR REPAIR AND   BICEPS TENODESIS.    SURGEON:  Surgeon(s): Cammy Copa, MD  ASSISTANT: carla bethune rnfa  ANESTHESIA:   general  EBL: 50 ml    Total I/O In: 700 [I.V.:700] Out: 100 [Blood:100]  BLOOD ADMINISTERED: none  DRAINS: none   LOCAL MEDICATIONS USED:  none  SPECIMEN:  No Specimen  COUNTS:  YES  TOURNIQUET:  * No tourniquets in log *  DICTATION: .Other Dictation: Dictation Number [id number]  PLAN OF CARE: Discharge to home after PACU  PATIENT DISPOSITION:  PACU - hemodynamically stable

## 2015-08-24 NOTE — Anesthesia Procedure Notes (Addendum)
Procedure Name: Intubation Date/Time: 08/24/2015 7:54 AM Performed by: Leonel Ramsay Pre-anesthesia Checklist: Patient identified, Timeout performed, Emergency Drugs available, Suction available and Patient being monitored Patient Re-evaluated:Patient Re-evaluated prior to inductionOxygen Delivery Method: Circle system utilized Preoxygenation: Pre-oxygenation with 100% oxygen Intubation Type: IV induction Ventilation: Mask ventilation without difficulty and Oral airway inserted - appropriate to patient size Laryngoscope Size: Mac and 4 Grade View: Grade I Tube type: Oral Tube size: 7.5 mm Number of attempts: 1 Airway Equipment and Method: Stylet and Oral airway Placement Confirmation: ETT inserted through vocal cords under direct vision,  positive ETCO2 and breath sounds checked- equal and bilateral Secured at: 24 cm Tube secured with: Tape Dental Injury: Teeth and Oropharynx as per pre-operative assessment     Anesthesia Regional Block:  Interscalene brachial plexus block  Pre-Anesthetic Checklist: ,, timeout performed, Correct Patient, Correct Site, Correct Laterality, Correct Procedure, Correct Position, site marked, Risks and benefits discussed,  Surgical consent,  Pre-op evaluation,  At surgeon's request and post-op pain management  Laterality: Upper and Right  Prep: chloraprep and alcohol swabs       Needles:  Injection technique: Single-shot  Needle Type: Echogenic Stimulator Needle     Needle Length: 9cm 9 cm Needle Gauge: 22 and 22 G  Needle insertion depth: 4 cm   Additional Needles:  Procedures: ultrasound guided (picture in chart) and nerve stimulator Interscalene brachial plexus block  Nerve Stimulator or Paresthesia:  Response: Twitch elicited, 0.5 mA, 0.3 ms,   Additional Responses:   Narrative:  Start time: 08/24/2015 7:15 AM End time: 08/24/2015 7:30 AM Injection made incrementally with aspirations every 5 mL.  Performed by: Personally   Anesthesiologist: MASSAGEE, TERRY  Additional Notes: Block assessed prior to start of surgery

## 2015-08-24 NOTE — H&P (Signed)
Paul Paul is an 59 y.o. male.   Chief Complaint: Right shoulder pain HPI: Paul Paul is a 59-year-old patient with right shoulder pain of several months duration. Reports pain and inability to use this arm for functional activity. Has had a left shoulder large rotator cuff tear repaired and did well with that several years ago. The patient is failed conservative management including injections, access program activity modification. CT scan consistent with 2 cm rotator cuff tear patient presents now for operative management no family history of DVT or pulmonary embolism  Past Medical History  Diagnosis Date  . HYPERTENSION 12/10/2007  . Diabetes mellitus     now under control  . Pacemaker   . Bradycardia   . Arthritis   . Shoulder pain     L SHOULDER. RECENT FALL  . Anxiety   . Bell palsy     > 30 years. Left side of face droops a little  . Constipation   . Cerebral aneurysm   . Complication of anesthesia 2012    after surgery for cliping of cerebral anerysum- In the room, patient was shaking, and patient would awaken and then "go back out" shaking continued after he "went back out:  . Dysrhythmia     BRADYCARDIA REQUIRING PACEMAKER .FOLLOWED BY GREGG TAYLOR  . Sleep apnea     CPAP.NOT WEARING NOW.NEEDS REPLACEMENT PIECE  . Cerebral aneurysm without rupture 06/2011    Middle of Cerebral Artery    Past Surgical History  Procedure Laterality Date  . Septal deviation    . Knee surgery Left     left; arthoscopic  . Insert / replace / remove pacemaker  2012  . Rotator cuff surgery Left 02/2012  . Cardiac catheterization  07/06/11    normal  . Fracture surgery Left     foot  . Cerebral aneurysm repair  06/2011    "stenting and coiling" per patient    Family History  Problem Relation Age of Onset  . Heart attack Mother     died 54, smoker  . Heart disease Mother   . Diabetes Mother   . Heart attack Father     died 54, smoker  . Heart disease Father    Social History:  reports  that he quit smoking about 4 years ago. His smoking use included Cigarettes. He has a 20 pack-year smoking history. He has never used smokeless tobacco. He reports that he does not drink alcohol or use illicit drugs.  Allergies:  Allergies  Allergen Reactions  . Losartan Potassium-Hctz Other (See Comments)     dizziness, fatigue  . Prednisone Other (See Comments)    Raises blood sugar  . Doxycycline Rash  . Lisinopril Rash    Medications Prior to Admission  Medication Sig Dispense Refill  . aspirin EC 81 MG tablet Take 81 mg by mouth daily.    . metoprolol succinate (TOPROL-XL) 100 MG 24 hr tablet Take 1 tablet (100 mg total) by mouth every morning. 90 tablet 2  . penicillin v potassium (VEETID) 500 MG tablet Take 500 mg by mouth 3 (three) times daily.      Results for orders placed or performed during the hospital encounter of 08/24/15 (from the past 48 hour(s))  CBC     Status: None   Collection Time: 08/24/15  6:33 AM  Result Value Ref Range   WBC 6.2 4.0 - 10.5 K/uL   RBC 5.30 4.22 - 5.81 MIL/uL   Hemoglobin 16.3 13.0 - 17.0 g/dL     HCT 47.8 39.0 - 52.0 %   MCV 90.2 78.0 - 100.0 fL   MCH 30.8 26.0 - 34.0 pg   MCHC 34.1 30.0 - 36.0 g/dL   RDW 13.1 11.5 - 15.5 %   Platelets 241 150 - 400 K/uL  Basic metabolic panel     Status: Abnormal   Collection Time: 08/24/15  6:33 AM  Result Value Ref Range   Sodium 137 135 - 145 mmol/L   Potassium 4.6 3.5 - 5.1 mmol/L   Chloride 103 101 - 111 mmol/L   CO2 27 22 - 32 mmol/L   Glucose, Bld 137 (H) 65 - 99 mg/dL   BUN 11 6 - 20 mg/dL   Creatinine, Ser 1.06 0.61 - 1.24 mg/dL   Calcium 9.3 8.9 - 10.3 mg/dL   GFR calc non Af Amer >60 >60 mL/min   GFR calc Af Amer >60 >60 mL/min    Comment: (NOTE) The eGFR has been calculated using the CKD EPI equation. This calculation has not been validated in all clinical situations. eGFR's persistently <60 mL/min signify possible Chronic Kidney Disease.    Anion gap 7 5 - 15   No results  found.  Review of Systems  Constitutional: Negative.   HENT: Negative.   Eyes: Negative.   Respiratory: Negative.   Cardiovascular: Negative.   Gastrointestinal: Negative.   Genitourinary: Negative.   Musculoskeletal: Positive for joint pain.  Skin: Negative.   Neurological: Negative.   Endo/Heme/Allergies: Negative.   Psychiatric/Behavioral: Negative.     Blood pressure 126/73, pulse 60, temperature 98.2 F (36.8 C), temperature source Oral, resp. rate 20, height 6' 3" (1.905 m), weight 117.935 kg (260 lb), SpO2 96 %. Physical Exam  Constitutional: He appears well-developed.  HENT:  Head: Normocephalic.  Eyes: Pupils are equal, round, and reactive to light.  Neck: Normal range of motion.  Cardiovascular: Normal rate.   Respiratory: Effort normal.  Neurological: He is alert.  Skin: Skin is warm.  Psychiatric: He has a normal mood and affect.   examination the right shoulder demonstrates coarseness with passive range of motion weakness to supraspinous testing no before meals joint tenderness direct palpation impingement signs positive passive range of motion maintained with external rotation fowardr flexion abduction no other masses lymphadenopathy or skin changes noted in the right shoulder girdle region  Assessment/Plan Impression is right shoulder rotator cuff tear possible biceps tendon partial tearing plan shoulder arthroscopy possible biceps tendon release and debridement subacromial decompression mini open rotator cuff tear repair. Risk and benefits discussed with the patient including but limited to infection nerve vessel damage shoulder stiffness. Patient's left-sided rotator cuff tear was actually larger than this one. Patient understands risk and benefits of surgery and wished proceed with intervention all questions answered plan for outpatient surgery discharge home with block and postop pain medicine. Need to use CPAP also discussed.  DEAN,[name] 08/24/2015, 7:15  AM

## 2015-08-24 NOTE — Anesthesia Postprocedure Evaluation (Signed)
  Anesthesia Post-op Note  Patient: Paul Paul  Procedure(s) Performed: Procedure(s) with comments: RIGHT SHOULDER DIAGNOSTIC OPERATIVE ARTHROSCOPY WITH DEBRIDEMENT, SUBACROMIAL DECOMPRESSION, MIMI-OPEN ROTATOR CUFF TEAR REPAIR AND POSSIBLE  BICEPS TENODESIS.   (Right) - RIGHT SHOULDER DIAGNOSTIC OPERATIVE ARTHROSCOPY, DEBRIDEMENT, SUBACROMIAL DECOMPRESSION, MINI-OPEN ROTATOR CUFF TEAR REPAIR, POSSIBLE BICEPS TENODESIS.  Patient Location: PACU  Anesthesia Type:GA combined with regional for post-op pain  Level of Consciousness: awake and alert   Airway and Oxygen Therapy: Patient Spontanous Breathing  Post-op Pain: mild  Post-op Assessment: Post-op Vital signs reviewed and Pain level controlled              Post-op Vital Signs: stable  Last Vitals:  Filed Vitals:   08/24/15 1217  BP: 123/85  Pulse: 59  Temp:   Resp: 16    Complications: No apparent anesthesia complications

## 2015-08-24 NOTE — Anesthesia Preprocedure Evaluation (Signed)
Anesthesia Evaluation  Patient identified by MRN, date of birth, ID band Patient awake    Reviewed: Allergy & Precautions, NPO status , Patient's Chart, lab work & pertinent test results  History of Anesthesia Complications (+) history of anesthetic complications  Airway Mallampati: II       Dental  (+) Teeth Intact   Pulmonary shortness of breath, sleep apnea , former smoker,    breath sounds clear to auscultation       Cardiovascular hypertension, + dysrhythmias + pacemaker  Rhythm:Regular Rate:Normal     Neuro/Psych    GI/Hepatic negative GI ROS, Neg liver ROS,   Endo/Other  diabetesMorbid obesity  Renal/GU      Musculoskeletal  (+) Arthritis ,   Abdominal   Peds  Hematology   Anesthesia Other Findings   Reproductive/Obstetrics                             Anesthesia Physical Anesthesia Plan  ASA: II  Anesthesia Plan: General   Post-op Pain Management: GA combined w/ Regional for post-op pain   Induction: Intravenous  Airway Management Planned: Oral ETT  Additional Equipment:   Intra-op Plan:   Post-operative Plan: Extubation in OR  Informed Consent: I have reviewed the patients History and Physical, chart, labs and discussed the procedure including the risks, benefits and alternatives for the proposed anesthesia with the patient or authorized representative who has indicated his/her understanding and acceptance.   Dental advisory given  Plan Discussed with: CRNA and Surgeon  Anesthesia Plan Comments:         Anesthesia Quick Evaluation

## 2015-08-25 ENCOUNTER — Encounter (HOSPITAL_COMMUNITY): Payer: Self-pay | Admitting: Orthopedic Surgery

## 2015-08-25 NOTE — Op Note (Signed)
NAME:  Paul Paul, Paul Paul NO.:  [id number]  MEDICAL RECORD NO.:  [id number]  LOCATION:  [facility]                         FACILITY:  [facility]  PHYSICIAN:  Burnard Bunting, M.D.    DATE OF BIRTH:  1956-03-26  DATE OF PROCEDURE:  08/24/2015 DATE OF DISCHARGE:  08/24/2015                              OPERATIVE REPORT   PREOPERATIVE DIAGNOSIS:  Right shoulder rotator cuff tear and bursitis.  POSTOPERATIVE DIAGNOSES:  Right shoulder rotator cuff tear, supraspinatus, part of the infraspinatus, measuring about 2 x 2 cm with a type 2 SLAP tear, degeneration of the biceps tendon attachment, bursitis, and inferior clavicular spurring.  PROCEDURE:  Right shoulder arthroscopy, labral debridement, biceps tendon release, subacromial decompression, mini open rotator cuff repair, open biceps tenodesis.  SURGEON:  Burnard Bunting, M.D.  ASSISTANT:  Patrick Jupiter, RNFA.  ANESTHESIA:  General.  INDICATIONS:  Paul Paul is a 59 year old patient with right shoulder rotator cuff tear, presents for operative management after explanation of risks and benefits.  OPERATIVE FINDINGS: 1. Examination under anesthesia, range of motion, full forward flexion     180, external rotation 15 degrees, adduction is about 95. 2. Diagnostic arthroscopy.     a.     Type 2 SLAP tear primarily anterior with degeneration,      fraying, and tearing of the biceps anchor.     b.     Intact glenohumeral articular surfaces.     c.     Intact anterior-inferior, posterior-inferior glenohumeral      ligaments.     d.     Tear of the rotator cuff supraspinatus as well as part of      the infraspinatus measuring about 2 x 2 cm which was more of a      degenerative type tear and did not appear traumatic.     e.     Inferior clavicular spurring and bursitis.  DESCRIPTION OF PROCEDURE:  The patient was brought to operating room where general endotracheal anesthesia induced.  Preoperative antibiotics were administered.   Time-out was called.  The patient was examined under anesthesia, placed in a beach-chair position with the head in neutral position.  Right arm was prescrubbed with alcohol and Betadine and allowed to air dry, prepped with DuraPrep solution, draped in sterile manner including the hand.  [name] was used to cover the axilla and then for the open part used to cover the operative field.  Solution of saline with epinephrine injected in subacromial space.  Solution of saline then injected into the glenohumeral joint.  The posterior portal was created 2 cm medial and inferior to the posterolateral margin of the acromion. Diagnostic arthroscopy was performed.  Anterior portal created under direct visualization.  The patient did have significant degeneration and type 2 SLAP tear affecting the biceps anchor.  Decision was made for biceps tenodesis.  Biceps tendon was released.  Debridement was performed in the superior labrum.  Rotator cuff tear was identified. Glenohumeral articular surfaces were generally intact.  At this time, scope placed into the subacromial space through a lateral portal. Bursectomy and subacromial decompression were performed.  Instruments were removed after acromioplasty.  Portals were  closed using 3-0 nylon. Incision then made on the anterolateral aspect of the acromion.  Skin and subcu tissues were sharply divided.  Deltoid was split and measured at distance 4 cm from the anterolateral margin of the acromion, marked with #1 Vicryl suture.  The rotator cuff tear was identified, rotator cuff tendon was cut back to freshen the edges.  The footprint was prepared with a curette.  Biceps tenodesis performed by opening the transverse humeral ligament on its medial side.  The tendon was tenodesed with a 7 x 23 mm Bio-Tenodesis Screw and had appropriate tension.  At this time, the rotator cuff tear was repaired using 2 corkscrews, 2 PushLocks, as well as after also closing the gap  and closing the U-shaped tear using 2-0 FiberWire suture.  Watertight repair was achieved.  The PushLocks utilized as well after the corkscrews.  Arm taken through range of motion, found to have no impingement. Subacromial decompression was found to be adequate.  Thorough irrigation was performed.  Deltoid split and closed using 0 Vicryl followed by interrupted, inverted 2-0 Vicryl and 3-0 Monocryl.  Impervious dressing applied.  The patient tolerated the procedure without immediate complication, transferred to recovery room in stable condition.     Burnard Bunting, M.D.     GSD/MEDQ  D:  08/24/2015  T:  08/24/2015  Job:  [id number]

## 2015-08-26 DIAGNOSIS — M75101 Unspecified rotator cuff tear or rupture of right shoulder, not specified as traumatic: Secondary | ICD-10-CM | POA: Diagnosis not present

## 2015-08-26 DIAGNOSIS — M25611 Stiffness of right shoulder, not elsewhere classified: Secondary | ICD-10-CM | POA: Diagnosis not present

## 2015-08-26 DIAGNOSIS — M25511 Pain in right shoulder: Secondary | ICD-10-CM | POA: Diagnosis not present

## 2015-08-26 DIAGNOSIS — M6281 Muscle weakness (generalized): Secondary | ICD-10-CM | POA: Diagnosis not present

## 2015-08-30 DIAGNOSIS — M25511 Pain in right shoulder: Secondary | ICD-10-CM | POA: Diagnosis not present

## 2015-08-30 DIAGNOSIS — M6281 Muscle weakness (generalized): Secondary | ICD-10-CM | POA: Diagnosis not present

## 2015-08-30 DIAGNOSIS — M25611 Stiffness of right shoulder, not elsewhere classified: Secondary | ICD-10-CM | POA: Diagnosis not present

## 2015-08-30 DIAGNOSIS — M75101 Unspecified rotator cuff tear or rupture of right shoulder, not specified as traumatic: Secondary | ICD-10-CM | POA: Diagnosis not present

## 2015-09-01 DIAGNOSIS — M25611 Stiffness of right shoulder, not elsewhere classified: Secondary | ICD-10-CM | POA: Diagnosis not present

## 2015-09-01 DIAGNOSIS — M25511 Pain in right shoulder: Secondary | ICD-10-CM | POA: Diagnosis not present

## 2015-09-01 DIAGNOSIS — M75101 Unspecified rotator cuff tear or rupture of right shoulder, not specified as traumatic: Secondary | ICD-10-CM | POA: Diagnosis not present

## 2015-09-01 DIAGNOSIS — M6281 Muscle weakness (generalized): Secondary | ICD-10-CM | POA: Diagnosis not present

## 2015-09-03 DIAGNOSIS — M25511 Pain in right shoulder: Secondary | ICD-10-CM | POA: Diagnosis not present

## 2015-09-03 DIAGNOSIS — M25611 Stiffness of right shoulder, not elsewhere classified: Secondary | ICD-10-CM | POA: Diagnosis not present

## 2015-09-03 DIAGNOSIS — M75101 Unspecified rotator cuff tear or rupture of right shoulder, not specified as traumatic: Secondary | ICD-10-CM | POA: Diagnosis not present

## 2015-09-03 DIAGNOSIS — M6281 Muscle weakness (generalized): Secondary | ICD-10-CM | POA: Diagnosis not present

## 2015-09-06 DIAGNOSIS — M25611 Stiffness of right shoulder, not elsewhere classified: Secondary | ICD-10-CM | POA: Diagnosis not present

## 2015-09-06 DIAGNOSIS — M6281 Muscle weakness (generalized): Secondary | ICD-10-CM | POA: Diagnosis not present

## 2015-09-06 DIAGNOSIS — M75101 Unspecified rotator cuff tear or rupture of right shoulder, not specified as traumatic: Secondary | ICD-10-CM | POA: Diagnosis not present

## 2015-09-06 DIAGNOSIS — M25511 Pain in right shoulder: Secondary | ICD-10-CM | POA: Diagnosis not present

## 2015-09-08 DIAGNOSIS — M75101 Unspecified rotator cuff tear or rupture of right shoulder, not specified as traumatic: Secondary | ICD-10-CM | POA: Diagnosis not present

## 2015-09-08 DIAGNOSIS — M25511 Pain in right shoulder: Secondary | ICD-10-CM | POA: Diagnosis not present

## 2015-09-08 DIAGNOSIS — M25611 Stiffness of right shoulder, not elsewhere classified: Secondary | ICD-10-CM | POA: Diagnosis not present

## 2015-09-08 DIAGNOSIS — M6281 Muscle weakness (generalized): Secondary | ICD-10-CM | POA: Diagnosis not present

## 2015-09-13 DIAGNOSIS — M25511 Pain in right shoulder: Secondary | ICD-10-CM | POA: Diagnosis not present

## 2015-09-13 DIAGNOSIS — M75101 Unspecified rotator cuff tear or rupture of right shoulder, not specified as traumatic: Secondary | ICD-10-CM | POA: Diagnosis not present

## 2015-09-13 DIAGNOSIS — M6281 Muscle weakness (generalized): Secondary | ICD-10-CM | POA: Diagnosis not present

## 2015-09-13 DIAGNOSIS — M25611 Stiffness of right shoulder, not elsewhere classified: Secondary | ICD-10-CM | POA: Diagnosis not present

## 2015-09-15 DIAGNOSIS — M25611 Stiffness of right shoulder, not elsewhere classified: Secondary | ICD-10-CM | POA: Diagnosis not present

## 2015-09-15 DIAGNOSIS — M6281 Muscle weakness (generalized): Secondary | ICD-10-CM | POA: Diagnosis not present

## 2015-09-15 DIAGNOSIS — M25511 Pain in right shoulder: Secondary | ICD-10-CM | POA: Diagnosis not present

## 2015-09-15 DIAGNOSIS — M75101 Unspecified rotator cuff tear or rupture of right shoulder, not specified as traumatic: Secondary | ICD-10-CM | POA: Diagnosis not present

## 2015-09-17 DIAGNOSIS — M25611 Stiffness of right shoulder, not elsewhere classified: Secondary | ICD-10-CM | POA: Diagnosis not present

## 2015-09-17 DIAGNOSIS — M75101 Unspecified rotator cuff tear or rupture of right shoulder, not specified as traumatic: Secondary | ICD-10-CM | POA: Diagnosis not present

## 2015-09-17 DIAGNOSIS — M25511 Pain in right shoulder: Secondary | ICD-10-CM | POA: Diagnosis not present

## 2015-09-17 DIAGNOSIS — M6281 Muscle weakness (generalized): Secondary | ICD-10-CM | POA: Diagnosis not present

## 2015-09-20 DIAGNOSIS — M25611 Stiffness of right shoulder, not elsewhere classified: Secondary | ICD-10-CM | POA: Diagnosis not present

## 2015-09-20 DIAGNOSIS — M25511 Pain in right shoulder: Secondary | ICD-10-CM | POA: Diagnosis not present

## 2015-09-20 DIAGNOSIS — M6281 Muscle weakness (generalized): Secondary | ICD-10-CM | POA: Diagnosis not present

## 2015-09-20 DIAGNOSIS — M75101 Unspecified rotator cuff tear or rupture of right shoulder, not specified as traumatic: Secondary | ICD-10-CM | POA: Diagnosis not present

## 2015-09-23 DIAGNOSIS — M25511 Pain in right shoulder: Secondary | ICD-10-CM | POA: Diagnosis not present

## 2015-09-23 DIAGNOSIS — M6281 Muscle weakness (generalized): Secondary | ICD-10-CM | POA: Diagnosis not present

## 2015-09-23 DIAGNOSIS — M75101 Unspecified rotator cuff tear or rupture of right shoulder, not specified as traumatic: Secondary | ICD-10-CM | POA: Diagnosis not present

## 2015-09-23 DIAGNOSIS — M25611 Stiffness of right shoulder, not elsewhere classified: Secondary | ICD-10-CM | POA: Diagnosis not present

## 2015-09-27 DIAGNOSIS — M25511 Pain in right shoulder: Secondary | ICD-10-CM | POA: Diagnosis not present

## 2015-09-27 DIAGNOSIS — M75101 Unspecified rotator cuff tear or rupture of right shoulder, not specified as traumatic: Secondary | ICD-10-CM | POA: Diagnosis not present

## 2015-09-27 DIAGNOSIS — M6281 Muscle weakness (generalized): Secondary | ICD-10-CM | POA: Diagnosis not present

## 2015-09-27 DIAGNOSIS — M25611 Stiffness of right shoulder, not elsewhere classified: Secondary | ICD-10-CM | POA: Diagnosis not present

## 2015-09-30 ENCOUNTER — Other Ambulatory Visit: Payer: Commercial Managed Care - HMO

## 2015-09-30 DIAGNOSIS — M25511 Pain in right shoulder: Secondary | ICD-10-CM | POA: Diagnosis not present

## 2015-09-30 DIAGNOSIS — M6281 Muscle weakness (generalized): Secondary | ICD-10-CM | POA: Diagnosis not present

## 2015-09-30 DIAGNOSIS — M75101 Unspecified rotator cuff tear or rupture of right shoulder, not specified as traumatic: Secondary | ICD-10-CM | POA: Diagnosis not present

## 2015-09-30 DIAGNOSIS — M25611 Stiffness of right shoulder, not elsewhere classified: Secondary | ICD-10-CM | POA: Diagnosis not present

## 2015-10-05 ENCOUNTER — Ambulatory Visit: Payer: Commercial Managed Care - HMO | Admitting: Family Medicine

## 2015-10-05 DIAGNOSIS — M6281 Muscle weakness (generalized): Secondary | ICD-10-CM | POA: Diagnosis not present

## 2015-10-05 DIAGNOSIS — M25511 Pain in right shoulder: Secondary | ICD-10-CM | POA: Diagnosis not present

## 2015-10-05 DIAGNOSIS — M25611 Stiffness of right shoulder, not elsewhere classified: Secondary | ICD-10-CM | POA: Diagnosis not present

## 2015-10-05 DIAGNOSIS — M75101 Unspecified rotator cuff tear or rupture of right shoulder, not specified as traumatic: Secondary | ICD-10-CM | POA: Diagnosis not present

## 2015-10-07 DIAGNOSIS — M75101 Unspecified rotator cuff tear or rupture of right shoulder, not specified as traumatic: Secondary | ICD-10-CM | POA: Diagnosis not present

## 2015-10-07 DIAGNOSIS — M25511 Pain in right shoulder: Secondary | ICD-10-CM | POA: Diagnosis not present

## 2015-10-07 DIAGNOSIS — M25611 Stiffness of right shoulder, not elsewhere classified: Secondary | ICD-10-CM | POA: Diagnosis not present

## 2015-10-07 DIAGNOSIS — M6281 Muscle weakness (generalized): Secondary | ICD-10-CM | POA: Diagnosis not present

## 2015-10-11 DIAGNOSIS — M6281 Muscle weakness (generalized): Secondary | ICD-10-CM | POA: Diagnosis not present

## 2015-10-11 DIAGNOSIS — M25611 Stiffness of right shoulder, not elsewhere classified: Secondary | ICD-10-CM | POA: Diagnosis not present

## 2015-10-11 DIAGNOSIS — M25511 Pain in right shoulder: Secondary | ICD-10-CM | POA: Diagnosis not present

## 2015-10-11 DIAGNOSIS — M75101 Unspecified rotator cuff tear or rupture of right shoulder, not specified as traumatic: Secondary | ICD-10-CM | POA: Diagnosis not present

## 2015-10-14 DIAGNOSIS — M75101 Unspecified rotator cuff tear or rupture of right shoulder, not specified as traumatic: Secondary | ICD-10-CM | POA: Diagnosis not present

## 2015-10-14 DIAGNOSIS — M25511 Pain in right shoulder: Secondary | ICD-10-CM | POA: Diagnosis not present

## 2015-10-14 DIAGNOSIS — M25611 Stiffness of right shoulder, not elsewhere classified: Secondary | ICD-10-CM | POA: Diagnosis not present

## 2015-10-14 DIAGNOSIS — M6281 Muscle weakness (generalized): Secondary | ICD-10-CM | POA: Diagnosis not present

## 2015-10-18 DIAGNOSIS — M25611 Stiffness of right shoulder, not elsewhere classified: Secondary | ICD-10-CM | POA: Diagnosis not present

## 2015-10-18 DIAGNOSIS — M25511 Pain in right shoulder: Secondary | ICD-10-CM | POA: Diagnosis not present

## 2015-10-18 DIAGNOSIS — M6281 Muscle weakness (generalized): Secondary | ICD-10-CM | POA: Diagnosis not present

## 2015-10-18 DIAGNOSIS — M75101 Unspecified rotator cuff tear or rupture of right shoulder, not specified as traumatic: Secondary | ICD-10-CM | POA: Diagnosis not present

## 2015-10-21 DIAGNOSIS — M75101 Unspecified rotator cuff tear or rupture of right shoulder, not specified as traumatic: Secondary | ICD-10-CM | POA: Diagnosis not present

## 2015-10-21 DIAGNOSIS — M6281 Muscle weakness (generalized): Secondary | ICD-10-CM | POA: Diagnosis not present

## 2015-10-21 DIAGNOSIS — M25511 Pain in right shoulder: Secondary | ICD-10-CM | POA: Diagnosis not present

## 2015-10-21 DIAGNOSIS — M25611 Stiffness of right shoulder, not elsewhere classified: Secondary | ICD-10-CM | POA: Diagnosis not present

## 2015-10-26 DIAGNOSIS — M75101 Unspecified rotator cuff tear or rupture of right shoulder, not specified as traumatic: Secondary | ICD-10-CM | POA: Diagnosis not present

## 2015-10-26 DIAGNOSIS — M6281 Muscle weakness (generalized): Secondary | ICD-10-CM | POA: Diagnosis not present

## 2015-10-26 DIAGNOSIS — M25611 Stiffness of right shoulder, not elsewhere classified: Secondary | ICD-10-CM | POA: Diagnosis not present

## 2015-10-26 DIAGNOSIS — M25511 Pain in right shoulder: Secondary | ICD-10-CM | POA: Diagnosis not present

## 2015-11-02 DIAGNOSIS — M25611 Stiffness of right shoulder, not elsewhere classified: Secondary | ICD-10-CM | POA: Diagnosis not present

## 2015-11-02 DIAGNOSIS — M75101 Unspecified rotator cuff tear or rupture of right shoulder, not specified as traumatic: Secondary | ICD-10-CM | POA: Diagnosis not present

## 2015-11-02 DIAGNOSIS — M25511 Pain in right shoulder: Secondary | ICD-10-CM | POA: Diagnosis not present

## 2015-11-02 DIAGNOSIS — M6281 Muscle weakness (generalized): Secondary | ICD-10-CM | POA: Diagnosis not present

## 2015-11-04 DIAGNOSIS — M6281 Muscle weakness (generalized): Secondary | ICD-10-CM | POA: Diagnosis not present

## 2015-11-04 DIAGNOSIS — M75101 Unspecified rotator cuff tear or rupture of right shoulder, not specified as traumatic: Secondary | ICD-10-CM | POA: Diagnosis not present

## 2015-11-04 DIAGNOSIS — M25611 Stiffness of right shoulder, not elsewhere classified: Secondary | ICD-10-CM | POA: Diagnosis not present

## 2015-11-04 DIAGNOSIS — M25511 Pain in right shoulder: Secondary | ICD-10-CM | POA: Diagnosis not present

## 2015-11-09 DIAGNOSIS — M6281 Muscle weakness (generalized): Secondary | ICD-10-CM | POA: Diagnosis not present

## 2015-11-09 DIAGNOSIS — M25511 Pain in right shoulder: Secondary | ICD-10-CM | POA: Diagnosis not present

## 2015-11-09 DIAGNOSIS — M75101 Unspecified rotator cuff tear or rupture of right shoulder, not specified as traumatic: Secondary | ICD-10-CM | POA: Diagnosis not present

## 2015-11-09 DIAGNOSIS — M25611 Stiffness of right shoulder, not elsewhere classified: Secondary | ICD-10-CM | POA: Diagnosis not present

## 2015-11-11 DIAGNOSIS — M75101 Unspecified rotator cuff tear or rupture of right shoulder, not specified as traumatic: Secondary | ICD-10-CM | POA: Diagnosis not present

## 2015-11-11 DIAGNOSIS — M25611 Stiffness of right shoulder, not elsewhere classified: Secondary | ICD-10-CM | POA: Diagnosis not present

## 2015-11-11 DIAGNOSIS — M6281 Muscle weakness (generalized): Secondary | ICD-10-CM | POA: Diagnosis not present

## 2015-11-11 DIAGNOSIS — M25511 Pain in right shoulder: Secondary | ICD-10-CM | POA: Diagnosis not present

## 2015-11-16 DIAGNOSIS — M6281 Muscle weakness (generalized): Secondary | ICD-10-CM | POA: Diagnosis not present

## 2015-11-16 DIAGNOSIS — M75101 Unspecified rotator cuff tear or rupture of right shoulder, not specified as traumatic: Secondary | ICD-10-CM | POA: Diagnosis not present

## 2015-11-16 DIAGNOSIS — M25611 Stiffness of right shoulder, not elsewhere classified: Secondary | ICD-10-CM | POA: Diagnosis not present

## 2015-11-16 DIAGNOSIS — M25511 Pain in right shoulder: Secondary | ICD-10-CM | POA: Diagnosis not present

## 2015-12-08 ENCOUNTER — Other Ambulatory Visit: Payer: Commercial Managed Care - HMO

## 2015-12-10 ENCOUNTER — Other Ambulatory Visit: Payer: Self-pay | Admitting: Family Medicine

## 2015-12-13 ENCOUNTER — Ambulatory Visit: Payer: Commercial Managed Care - HMO | Admitting: Family Medicine

## 2016-01-25 ENCOUNTER — Ambulatory Visit (INDEPENDENT_AMBULATORY_CARE_PROVIDER_SITE_OTHER): Payer: Commercial Managed Care - HMO | Admitting: Internal Medicine

## 2016-01-25 ENCOUNTER — Encounter: Payer: Self-pay | Admitting: Internal Medicine

## 2016-01-25 VITALS — BP 104/60 | HR 60 | Ht 75.0 in | Wt 279.0 lb

## 2016-01-25 DIAGNOSIS — R001 Bradycardia, unspecified: Secondary | ICD-10-CM

## 2016-01-25 LAB — CUP PACEART INCLINIC DEVICE CHECK
Brady Statistic RA Percent Paced: 88 %
Brady Statistic RV Percent Paced: 0.06 %
Implantable Lead Implant Date: 20120801
Implantable Lead Implant Date: 20120801
Implantable Lead Location: 753860
Lead Channel Impedance Value: 437.5 Ohm
Lead Channel Impedance Value: 450 Ohm
Lead Channel Pacing Threshold Amplitude: 0.75 V
Lead Channel Pacing Threshold Amplitude: 1.125 V
Lead Channel Pacing Threshold Pulse Width: 0.4 ms
Lead Channel Sensing Intrinsic Amplitude: 11.3 mV
Lead Channel Setting Pacing Amplitude: 1.375
Lead Channel Setting Pacing Amplitude: 2 V
Lead Channel Setting Pacing Pulse Width: 0.4 ms
Lead Channel Setting Sensing Sensitivity: 2 mV
MDC IDC LEAD LOCATION: 753859
MDC IDC MSMT BATTERY REMAINING LONGEVITY: 115.2
MDC IDC MSMT BATTERY VOLTAGE: 2.95 V
MDC IDC MSMT LEADCHNL RA PACING THRESHOLD AMPLITUDE: 0.75 V
MDC IDC MSMT LEADCHNL RA PACING THRESHOLD PULSEWIDTH: 0.4 ms
MDC IDC MSMT LEADCHNL RA SENSING INTR AMPL: 5 mV
MDC IDC MSMT LEADCHNL RV PACING THRESHOLD PULSEWIDTH: 0.4 ms
MDC IDC SESS DTM: 20170221094152
Pulse Gen Serial Number: 7250770

## 2016-01-25 NOTE — Patient Instructions (Signed)
Medication Instructions:  Your physician recommends that you continue on your current medications as directed. Please refer to the Current Medication list given to you today.   Labwork: None ordered   Testing/Procedures: None ordered   Follow-Up: Your physician wants you to follow-up in: 6 months in the device clinic and 12 months with Dr Taylor You will receive a reminder letter in the mail two months in advance. If you don't receive a letter, please call our office to schedule the follow-up appointment.   Any Other Special Instructions Will Be Listed Below (If Applicable).     If you need a refill on your cardiac medications before your next appointment, please call your pharmacy.   

## 2016-01-25 NOTE — Progress Notes (Signed)
HPI [name] returns today for followup. He is a pleasant 60 yo man with symptomatic bradycardia, s/p PPM, HTN, obesity and a cerebral aneurysm, status post treatment.  He denies chest pain or sob. He has started back exercising, and lost 60 pounds, and his diabetes has resolved. No syncope. No new problems since his last visit. He has undergone an extensive shoulder surgery and had a prolonged rehab. Allergies  Allergen Reactions  . Losartan Potassium-Hctz Other (See Comments)     dizziness, fatigue  . Prednisone Other (See Comments)    Raises blood sugar  . Doxycycline Rash  . Lisinopril Rash     Current Outpatient Prescriptions  Medication Sig Dispense Refill  . aspirin EC 81 MG tablet Take 81 mg by mouth daily.    . metoprolol succinate (TOPROL-XL) 100 MG 24 hr tablet TAKE 1 TABLET EVERY MORNING 90 tablet 2   No current facility-administered medications for this visit.     Past Medical History  Diagnosis Date  . HYPERTENSION 12/10/2007  . Diabetes mellitus     now under control  . Pacemaker   . Bradycardia   . Arthritis   . Shoulder pain     L SHOULDER. RECENT FALL  . Anxiety   . Bell palsy     > 30 years. Left side of face droops a little  . Constipation   . Cerebral aneurysm   . Complication of anesthesia 2012    after surgery for cliping of cerebral anerysum- In the room, patient was shaking, and patient would awaken and then "go back out" shaking continued after he "went back out:  . Dysrhythmia     BRADYCARDIA REQUIRING PACEMAKER .FOLLOWED BY GREGG TAYLOR  . Sleep apnea     CPAP.NOT WEARING NOW.NEEDS REPLACEMENT PIECE  . Cerebral aneurysm without rupture 06/2011    Middle of Cerebral Artery    ROS:   All systems reviewed and negative except as noted in the HPI.   Past Surgical History  Procedure Laterality Date  . Septal deviation    . Knee surgery Left     left; arthoscopic  . Insert / replace / remove pacemaker  2012  . Rotator cuff surgery Left  02/2012  . Cardiac catheterization  07/06/11    normal  . Fracture surgery Left     foot  . Cerebral aneurysm repair  06/2011    "stenting and coiling" per patient  . Shoulder arthroscopy with subacromial decompression, rotator cuff repair and bicep tendon repair Right 08/24/2015    Procedure: RIGHT SHOULDER DIAGNOSTIC OPERATIVE ARTHROSCOPY WITH DEBRIDEMENT, SUBACROMIAL DECOMPRESSION, MIMI-OPEN ROTATOR CUFF TEAR REPAIR AND POSSIBLE  BICEPS TENODESIS.  ;  Surgeon: Cammy Copa, MD;  Location: [facility] OR;  Service: Orthopedics;  Laterality: Right;  RIGHT SHOULDER DIAGNOSTIC OPERATIVE ARTHROSCOPY, DEBRIDEMENT, SUBACROMIAL DECOMPRESSION, MINI-OPEN ROTATOR CUFF TEAR REPAIR, POSSIB     Family History  Problem Relation Age of Onset  . Heart attack Mother     died 26, smoker  . Heart disease Mother   . Diabetes Mother   . Heart attack Father     died 9, smoker  . Heart disease Father      Social History   Social History  . Marital Status: Married    Spouse Name: N/A  . Number of Children: N/A  . Years of Education: N/A   Occupational History  . Not on file.   Social History Main Topics  . Smoking status: Former Smoker -- 1.00 packs/day for 20  years    Types: Cigarettes    Quit date: 07/05/2011  . Smokeless tobacco: Never Used  . Alcohol Use: No  . Drug Use: No  . Sexual Activity: Not on file   Other Topics Concern  . Not on file   Social History Narrative   Married over 30 years in 2015. 1 step son. 1 grandson.       Disabled from brain aneurysm, pacemaker. Retired from [employer] years.       Hobbies: grandson, exercise at [facility]     BP 104/60 mmHg  Pulse 60  Ht  (1.905 m)  Wt 279 lb (126.554 kg)  BMI 34.87 kg/m2  SpO2 94%  Physical Exam:  stable appearing middle aged man, NAD HEENT: Unremarkable Neck:  6 cm JVD, no thyromegally Lungs:  Clear with no wheezes, rales, or rhonchi. Well healed PPM incision. HEART:  Regular rate rhythm, no murmurs, no  rubs, no clicks Abd:  soft, positive bowel sounds, no organomegally, no rebound, no guarding Ext:  2 plus pulses, no edema, no cyanosis, no clubbing Skin:  No rashes no nodules Neuro:  CN II through XII intact, motor grossly intact  DEVICE  Normal device function.  See PaceArt for details.   Assess/Plan:  1. Sinus node dysfunction - he is stable, s/p PPM 2. PPM - his [employer] device is working normally.  3. Obesity - he is s/p weight loss. He is still overweight but less so. 4. HTN - his blood pressure is well controlled. No change in meds.  Leonia Reeves.D.

## 2016-02-01 ENCOUNTER — Encounter: Payer: Self-pay | Admitting: Internal Medicine

## 2016-03-07 ENCOUNTER — Other Ambulatory Visit (INDEPENDENT_AMBULATORY_CARE_PROVIDER_SITE_OTHER): Payer: Commercial Managed Care - HMO

## 2016-03-07 DIAGNOSIS — E119 Type 2 diabetes mellitus without complications: Secondary | ICD-10-CM | POA: Diagnosis not present

## 2016-03-07 LAB — CHOLESTEROL, TOTAL: CHOLESTEROL: 183 mg/dL (ref 0–200)

## 2016-03-07 LAB — LDL CHOLESTEROL, DIRECT: Direct LDL: 120 mg/dL

## 2016-03-07 LAB — HEMOGLOBIN A1C: Hgb A1c MFr Bld: 8 % — ABNORMAL HIGH (ref 4.6–6.5)

## 2016-03-07 LAB — MICROALBUMIN / CREATININE URINE RATIO
CREATININE, U: 62 mg/dL
Microalb Creat Ratio: 1.1 mg/g (ref 0.0–30.0)

## 2016-03-07 LAB — HDL CHOLESTEROL: HDL: 34.9 mg/dL — AB (ref >39.00)

## 2016-03-14 ENCOUNTER — Encounter: Payer: Self-pay | Admitting: Family Medicine

## 2016-03-14 ENCOUNTER — Ambulatory Visit (INDEPENDENT_AMBULATORY_CARE_PROVIDER_SITE_OTHER): Payer: Commercial Managed Care - HMO | Admitting: Family Medicine

## 2016-03-14 VITALS — BP 140/80 | HR 92 | Temp 98.7°F | Ht 75.0 in | Wt 284.0 lb

## 2016-03-14 DIAGNOSIS — E119 Type 2 diabetes mellitus without complications: Secondary | ICD-10-CM | POA: Diagnosis not present

## 2016-03-14 DIAGNOSIS — E785 Hyperlipidemia, unspecified: Secondary | ICD-10-CM

## 2016-03-14 DIAGNOSIS — I10 Essential (primary) hypertension: Secondary | ICD-10-CM | POA: Diagnosis not present

## 2016-03-14 NOTE — Patient Instructions (Addendum)
Get eye exam scheduled and have results faxed to us at [phone].  Blood pressure, cholesterol, diabetes all up. Work toward your goal of 235-240. Within 3 months goal at least 10-15 lbs off.   No change in medicine for now- you opted to work on lifestyle changes

## 2016-03-14 NOTE — Assessment & Plan Note (Signed)
S: poorly controlled with weight up 24 lbs over 6 months. On metoprolol  100mg  BP Readings from Last 3 Encounters:  03/14/16 140/80  01/25/16 104/60  08/24/15 123/85  A/P:Continue current meds:  We opted to monitor and work on weight loss

## 2016-03-14 NOTE — Assessment & Plan Note (Signed)
S: poorly controlled on no medicine. No myalgias.  Lab Results  Component Value Date   CHOL 183 03/07/2016   HDL 34.90* 03/07/2016   LDLCALC 120* 11/10/2014   LDLDIRECT 120.0 03/07/2016   TRIG 133.0 11/10/2014   CHOLHDL 5 11/10/2014   A/P: we discussed statin with 10 year risk being above 20%. He declines- he wants to work on exercise/weight loss/healthy eating. We will readdress in 6-12 month time frame. His weight goal is 235-240

## 2016-03-14 NOTE — Progress Notes (Signed)
Tana ConchStephen Hunter, MD  Subjective:  Paul Paul is a 60 y.o. year old very pleasant male patient who presents for/with See problem oriented charting ROS- No chest pain or shortness of breath. No headache or blurry vision.   Past Medical History-  Patient Active Problem List   Diagnosis Date Noted  . Cerebral aneurysm without rupture 01/01/2012    Priority: High  . Pacemaker 10/07/2011    Priority: High  . Well controlled type 2 diabetes mellitus (HCC) 11/09/2008    Priority: High  . OSA on CPAP 09/24/2014    Priority: Medium  . Hyperlipidemia 09/24/2014    Priority: Medium  . Other specified cardiac dysrhythmias(427.89) 07/09/2012    Priority: Medium  . Essential hypertension 12/10/2007    Priority: Medium  . Obesity 01/01/2014    Priority: Low  . Aneurysm (HCC)     Medications- reviewed and updated Current Outpatient Prescriptions  Medication Sig Dispense Refill  . aspirin EC 81 MG tablet Take 81 mg by mouth daily.    . metoprolol succinate (TOPROL-XL) 100 MG 24 hr tablet TAKE 1 TABLET EVERY MORNING 90 tablet 2   No current facility-administered medications for this visit.    Objective: BP 140/80 mmHg  Pulse 92  Temp(Src) 98.7 F (37.1 C)  Ht 6\' 3"  (1.905 m)  Wt 284 lb (128.822 kg)  BMI 35.50 kg/m2 Gen: NAD, resting comfortably CV: RRR no murmurs rubs or gallops Lungs: CTAB no crackles, wheeze, rhonchi Abdomen: soft/nontender/nondistended/normal bowel sounds. obese Ext: no edema Skin: warm, dry Neuro: grossly normal, moves all extremities  Diabetic Foot Exam - Simple   Simple Foot Form  Diabetic Foot exam was performed with the following findings:  Yes 03/14/2016 10:00 AM  Visual Inspection  No deformities, no ulcerations, no other skin breakdown bilaterally:  Yes  Sensation Testing  Intact to touch and monofilament testing bilaterally:  Yes  Pulse Check  Posterior Tibialis and Dorsalis pulse intact bilaterally:  Yes  Comments        Assessment/Plan:  Well controlled type 2 diabetes mellitus S: poorly controlled. On no medicine CBGs- 140 this Am Exercise and diet- exercise down after shoulder surgery and really down in last few weeks as several family members with flu and he is caring for them. Weight up 24 lbs in 6 months.   Lab Results  Component Value Date   HGBA1C 8.0* 03/07/2016   HGBA1C 6.9* 11/10/2014   HGBA1C 6.3 12/08/2013   A/P: patient declines metformin option- wants to work on weight loss. Goal 10-15 lbs within 3 months   Essential hypertension S: poorly controlled with weight up 24 lbs over 6 months. On metoprolol  100mg  BP Readings from Last 3 Encounters:  03/14/16 140/80  01/25/16 104/60  08/24/15 123/85  A/P:Continue current meds:  We opted to monitor and work on weight loss  Hyperlipidemia S: poorly controlled on no medicine. No myalgias.  Lab Results  Component Value Date   CHOL 183 03/07/2016   HDL 34.90* 03/07/2016   LDLCALC 120* 11/10/2014   LDLDIRECT 120.0 03/07/2016   TRIG 133.0 11/10/2014   CHOLHDL 5 11/10/2014   A/P: we discussed statin with 10 year risk being above 20%. He declines- he wants to work on exercise/weight loss/healthy eating. We will readdress in 6-12 month time frame. His weight goal is 235-240    Return in about 3 months (around 06/13/2016) for follow up- or sooner if needed. will do a1c in office with fingerprick. . Return precautions advised.

## 2016-03-14 NOTE — Assessment & Plan Note (Addendum)
S: poorly controlled. On no medicine CBGs- 140 this Am Exercise and diet- exercise down after shoulder surgery and really down in last few weeks as several family members with flu and he is caring for them. Weight up 24 lbs in 6 months.   Lab Results  Component Value Date   HGBA1C 8.0* 03/07/2016   HGBA1C 6.9* 11/10/2014   HGBA1C 6.3 12/08/2013   A/P: patient declines metformin option- wants to work on weight loss. Goal 10-15 lbs within 3 months

## 2016-03-16 ENCOUNTER — Telehealth: Payer: Self-pay | Admitting: Family Medicine

## 2016-03-16 NOTE — Telephone Encounter (Signed)
Pt needs OV for antibiotic.

## 2016-03-16 NOTE — Telephone Encounter (Signed)
Pt need new Rx for Tamiflu and Zpac.  Pharm [facility] on [address]

## 2016-03-20 NOTE — Telephone Encounter (Signed)
Pt decline to make appt

## 2016-04-13 ENCOUNTER — Telehealth: Payer: Self-pay | Admitting: Internal Medicine

## 2016-04-13 NOTE — Telephone Encounter (Signed)
Spoke to patient about his upcoming eye surgery. I explained to him that it is ok for him to be given anesthesia even though he has a ppm. Patient voiced understanding.

## 2016-04-13 NOTE — Telephone Encounter (Signed)
Pt calling c/o dental work and needs to know if he can have any kind of anesthesia with his Pacemaker? pls call

## 2016-04-14 ENCOUNTER — Telehealth: Payer: Self-pay | Admitting: Family Medicine

## 2016-04-14 NOTE — Telephone Encounter (Signed)
Error/ltd ° °

## 2016-05-17 ENCOUNTER — Emergency Department (HOSPITAL_COMMUNITY)
Admission: EM | Admit: 2016-05-17 | Discharge: 2016-05-17 | Disposition: A | Discharge status: Home / Self Care | Payer: Commercial Managed Care - HMO | Attending: Dermatology | Admitting: Dermatology

## 2016-05-17 ENCOUNTER — Encounter (HOSPITAL_COMMUNITY): Payer: Self-pay | Admitting: Nurse Practitioner

## 2016-05-17 DIAGNOSIS — I10 Essential (primary) hypertension: Secondary | ICD-10-CM | POA: Diagnosis not present

## 2016-05-17 DIAGNOSIS — E119 Type 2 diabetes mellitus without complications: Secondary | ICD-10-CM | POA: Insufficient documentation

## 2016-05-17 DIAGNOSIS — Z5321 Procedure and treatment not carried out due to patient leaving prior to being seen by health care provider: Secondary | ICD-10-CM | POA: Diagnosis not present

## 2016-05-17 DIAGNOSIS — Z87891 Personal history of nicotine dependence: Secondary | ICD-10-CM | POA: Insufficient documentation

## 2016-05-17 DIAGNOSIS — Z950 Presence of cardiac pacemaker: Secondary | ICD-10-CM | POA: Insufficient documentation

## 2016-05-17 DIAGNOSIS — R109 Unspecified abdominal pain: Secondary | ICD-10-CM | POA: Diagnosis not present

## 2016-05-17 NOTE — ED Notes (Signed)
Pt c/o several week history of constipation. He c/o abd, rectal pain. He denies n/v. He is able to eat. He reports passing some small hard stools but no full bowel movement. He has tried multiple OTC remedies with no relief. He is alert and breathing easily

## 2016-05-22 DIAGNOSIS — H524 Presbyopia: Secondary | ICD-10-CM | POA: Diagnosis not present

## 2016-05-22 DIAGNOSIS — H2513 Age-related nuclear cataract, bilateral: Secondary | ICD-10-CM | POA: Diagnosis not present

## 2016-05-22 DIAGNOSIS — H5203 Hypermetropia, bilateral: Secondary | ICD-10-CM | POA: Diagnosis not present

## 2016-05-22 DIAGNOSIS — E119 Type 2 diabetes mellitus without complications: Secondary | ICD-10-CM | POA: Diagnosis not present

## 2016-05-22 DIAGNOSIS — H25013 Cortical age-related cataract, bilateral: Secondary | ICD-10-CM | POA: Diagnosis not present

## 2016-05-22 LAB — HM DIABETES EYE EXAM

## 2016-05-24 ENCOUNTER — Encounter: Payer: Self-pay | Admitting: Family Medicine

## 2016-06-07 ENCOUNTER — Telehealth: Payer: Self-pay | Admitting: Family Medicine

## 2016-06-07 DIAGNOSIS — H2512 Age-related nuclear cataract, left eye: Secondary | ICD-10-CM | POA: Diagnosis not present

## 2016-06-07 NOTE — Telephone Encounter (Signed)
Error/ltd ° °

## 2016-06-21 DIAGNOSIS — H2512 Age-related nuclear cataract, left eye: Secondary | ICD-10-CM | POA: Diagnosis not present

## 2016-06-28 DIAGNOSIS — H2511 Age-related nuclear cataract, right eye: Secondary | ICD-10-CM | POA: Diagnosis not present

## 2016-07-24 ENCOUNTER — Encounter (INDEPENDENT_AMBULATORY_CARE_PROVIDER_SITE_OTHER): Payer: Self-pay

## 2016-07-24 ENCOUNTER — Ambulatory Visit (INDEPENDENT_AMBULATORY_CARE_PROVIDER_SITE_OTHER): Payer: Commercial Managed Care - HMO | Admitting: *Deleted

## 2016-07-24 ENCOUNTER — Encounter: Payer: Self-pay | Admitting: Internal Medicine

## 2016-07-24 DIAGNOSIS — R001 Bradycardia, unspecified: Secondary | ICD-10-CM

## 2016-07-24 DIAGNOSIS — Z950 Presence of cardiac pacemaker: Secondary | ICD-10-CM

## 2016-07-24 LAB — CUP PACEART INCLINIC DEVICE CHECK
Battery Remaining Longevity: 116.4
Date Time Interrogation Session: 20170821120452
Implantable Lead Implant Date: 20120801
Implantable Lead Location: 753860
Lead Channel Pacing Threshold Amplitude: 1 V
Lead Channel Pacing Threshold Pulse Width: 0.4 ms
Lead Channel Pacing Threshold Pulse Width: 0.4 ms
Lead Channel Setting Pacing Amplitude: 1.25 V
Lead Channel Setting Pacing Amplitude: 2 V
Lead Channel Setting Pacing Pulse Width: 0.4 ms
Lead Channel Setting Sensing Sensitivity: 2 mV
MDC IDC LEAD IMPLANT DT: 20120801
MDC IDC LEAD LOCATION: 753859
MDC IDC MSMT BATTERY VOLTAGE: 2.95 V
MDC IDC MSMT LEADCHNL RA IMPEDANCE VALUE: 450 Ohm
MDC IDC MSMT LEADCHNL RA PACING THRESHOLD AMPLITUDE: 0.75 V
MDC IDC MSMT LEADCHNL RA SENSING INTR AMPL: 5 mV
MDC IDC MSMT LEADCHNL RV IMPEDANCE VALUE: 562.5 Ohm
MDC IDC MSMT LEADCHNL RV SENSING INTR AMPL: 11.7 mV
MDC IDC PG SERIAL: 7250770
MDC IDC STAT BRADY RA PERCENT PACED: 86 %
MDC IDC STAT BRADY RV PERCENT PACED: 0.06 %
Pulse Gen Model: 2210

## 2016-07-24 NOTE — Progress Notes (Signed)
Pacemaker check in clinic. Normal device function. Thresholds, sensing, impedances consistent with previous measurements. Device programmed to maximize longevity. No mode switches or high ventricular rates noted. Device programmed at appropriate safety margins. Histogram distribution appropriate for patient activity level. Device programmed to optimize intrinsic conduction. Estimated longevity 9.4-9.7 years. Patient education completed. ROV with GT in 6 months.

## 2016-09-24 ENCOUNTER — Other Ambulatory Visit: Payer: Self-pay | Admitting: Family Medicine

## 2016-10-02 ENCOUNTER — Encounter: Payer: Self-pay | Admitting: Family Medicine

## 2016-10-02 ENCOUNTER — Ambulatory Visit (INDEPENDENT_AMBULATORY_CARE_PROVIDER_SITE_OTHER): Payer: Commercial Managed Care - HMO | Admitting: Family Medicine

## 2016-10-02 VITALS — BP 136/76 | HR 75 | Temp 98.2°F | Ht 75.0 in | Wt 276.9 lb

## 2016-10-02 DIAGNOSIS — R21 Rash and other nonspecific skin eruption: Secondary | ICD-10-CM

## 2016-10-02 DIAGNOSIS — J069 Acute upper respiratory infection, unspecified: Secondary | ICD-10-CM | POA: Diagnosis not present

## 2016-10-02 MED ORDER — TRIAMCINOLONE ACETONIDE 0.1 % EX CREA: 1.0000 "application " | TOPICAL_CREAM | Freq: Two times a day (BID) | CUTANEOUS | 0 refills | Status: DC

## 2016-10-02 MED ORDER — CEPHALEXIN 500 MG PO CAPS: 500.0000 mg | ORAL_CAPSULE | Freq: Four times a day (QID) | ORAL | 0 refills | Status: DC

## 2016-10-02 MED ORDER — BENZONATATE 100 MG PO CAPS: 100.0000 mg | ORAL_CAPSULE | Freq: Two times a day (BID) | ORAL | 0 refills | Status: DC | PRN

## 2016-10-02 NOTE — Progress Notes (Signed)
Pre visit review using our clinic review tool, if applicable. No additional management support is needed unless otherwise documented below in the visit note. 

## 2016-10-02 NOTE — Progress Notes (Signed)
HPI:  Two concerns:  URI: -started: 3 days ago -symptoms:nasal congestion, sore throat, cough, PND -denies:fever, SOB, NVD, tooth pain -has tried: nothing, going on vacation and wanted treatment before leaves -sick contacts/travel/risks: no reported flu, strep or tick exposure  Skin Rash: -x2 weeks -itchy bumps on R arm and bilat upper legs, burning feeling on the legs -denies fevers, malaise, inset bites -denies swimming, hot tubs, new lotions, new exposures, new soaps  ROS: See pertinent positives and negatives per HPI.  Past Medical History:  Diagnosis Date  . Anxiety   . Arthritis   . Bell palsy    > 30 years. Left side of face droops a little  . Bradycardia   . Cerebral aneurysm   . Cerebral aneurysm without rupture 06/2011   Middle of Cerebral Artery  . Complication of anesthesia 2012   after surgery for cliping of cerebral anerysum- In the room, patient was shaking, and patient would awaken and then "go back out" shaking continued after he "went back out:  . Constipation   . Diabetes mellitus    now under control  . Dysrhythmia    BRADYCARDIA REQUIRING PACEMAKER .FOLLOWED BY GREGG TAYLOR  . HYPERTENSION 12/10/2007  . Pacemaker   . Shoulder pain    L SHOULDER. RECENT FALL  . Sleep apnea    CPAP.NOT WEARING NOW.NEEDS REPLACEMENT PIECE    Past Surgical History:  Procedure Laterality Date  . CARDIAC CATHETERIZATION  07/06/11   normal  . CEREBRAL ANEURYSM REPAIR  06/2011   "stenting and coiling" per patient  . FRACTURE SURGERY Left    foot  . INSERT / REPLACE / REMOVE PACEMAKER  2012  . KNEE SURGERY Left    left; arthoscopic  . rotator cuff surgery Left 02/2012  . septal deviation    . SHOULDER ARTHROSCOPY WITH SUBACROMIAL DECOMPRESSION, ROTATOR CUFF REPAIR AND BICEP TENDON REPAIR Right 08/24/2015   Procedure: RIGHT SHOULDER DIAGNOSTIC OPERATIVE ARTHROSCOPY WITH DEBRIDEMENT, SUBACROMIAL DECOMPRESSION, MIMI-OPEN ROTATOR CUFF TEAR REPAIR AND POSSIBLE  BICEPS  TENODESIS.  ;  Surgeon: Cammy CopaScott Gregory Dean, MD;  Location: [facility] OR;  Service: Orthopedics;  Laterality: Right;  RIGHT SHOULDER DIAGNOSTIC OPERATIVE ARTHROSCOPY, DEBRIDEMENT, SUBACROMIAL DECOMPRESSION, MINI-OPEN ROTATOR CUFF TEAR REPAIR, POSSIB    Family History  Problem Relation Age of Onset  . Heart attack Mother     died 3854, smoker  . Heart disease Mother   . Diabetes Mother   . Heart attack Father     died 1454, smoker  . Heart disease Father     Social History   Social History  . Marital status: Married    Spouse name: N/A  . Number of children: N/A  . Years of education: N/A   Social History Main Topics  . Smoking status: Former Smoker    Packs/day: 1.00    Years: 20.00    Types: Cigarettes    Quit date: 07/05/2011  . Smokeless tobacco: Never Used  . Alcohol use No  . Drug use: No  . Sexual activity: Not Asked   Other Topics Concern  . None   Social History Narrative   Married over 30 years in 2015. 1 step son. 1 grandson.       Disabled from brain aneurysm, pacemaker. Retired from [employer]-37 years.       Hobbies: grandson, exercise at [facility] [employer]     Current Outpatient Prescriptions:  .  aspirin EC 81 MG tablet, Take 81 mg by mouth daily., Disp: , Rfl:  .  metoprolol succinate (TOPROL-XL) 100 MG 24 hr tablet, TAKE 1 TABLET EVERY MORNING, Disp: 90 tablet, Rfl: 2 .  benzonatate (TESSALON) 100 MG capsule, Take 1 capsule (100 mg total) by mouth 2 (two) times daily as needed for cough., Disp: 20 capsule, Rfl: 0 .  cephALEXin (KEFLEX) 500 MG capsule, Take 1 capsule (500 mg total) by mouth 4 (four) times daily., Disp: 15 capsule, Rfl: 0 .  triamcinolone cream (KENALOG) 0.1 %, Apply 1 application topically 2 (two) times daily. To affected area for 5-7 days, Disp: 30 g, Rfl: 0  EXAM:  Vitals:   10/02/16 1300  BP: 136/76  Pulse: 75  Temp: 98.2 F (36.8 C)    Body mass index is 34.61 kg/m.  GENERAL: vitals reviewed and listed above, alert, oriented, appears well  hydrated and in no acute distress  HEENT: atraumatic, conjunttiva clear, no obvious abnormalities on inspection of external nose and ears, normal appearance of ear canals and TMs, clear nasal congestion, mild post oropharyngeal erythema with PND, no tonsillar edema or exudate, no sinus TTP  NECK: no obvious masses on inspection  LUNGS: clear to auscultation bilaterally, no wheezes, rales or rhonchi, good air movement  CV: HRRR, no peripheral edema  MS: moves all extremities without noticeable abnormality  SKIN: few small erythematous papules R ant forearm, erythematous papules mainly at hair follicles on bilat upper thighs with some surround erythema with some of these lesions  PSYCH: pleasant and cooperative, no obvious depression or anxiety  ASSESSMENT AND PLAN:  Discussed the following assessment and plan:  Acute upper respiratory infection -given HPI and exam findings today, a serious infection or illness is unlikely. We discussed potential etiologies, with VURI being most likely, and advised supportive care and monitoring. We discussed treatment side effects, likely course, antibiotic misuse, transmission, and signs of developing a serious illness.  Rash and nonspecific skin eruption -keflex and triam cream  -follow up next week if any rash persists -of course, we advised to return or notify a doctor immediately if symptoms worsen or persist or new concerns arise.    There are no Patient Instructions on file for this visit.  Kriste BasqueKIM, HANNAH R., DO

## 2016-10-02 NOTE — Patient Instructions (Signed)
BEFORE YOU LEAVE: -follow up: 1 week  For the rash: -take the keflex as prescribed -topical triamcinilone cream 1-2 times daily -good moisturizer such as cerave cream or aquaphor   INSTRUCTIONS FOR UPPER RESPIRATORY INFECTION:   -nasal saline wash 2-3 times daily (use prepackaged nasal saline or bottled/distilled water if making your own)   -can use AFRIN nasal spray for drainage and nasal congestion - but do NOT use longer then 3-4 days  -can use tylenol (in no history of liver disease) or ibuprofen (if no history of kidney disease, bowel bleeding or significant heart disease) as directed for aches and sorethroat  -in the winter time, using a humidifier at night is helpful (please follow cleaning instructions)  -if you are taking a cough medication - use only as directed, may also try a teaspoon of honey to coat the throat and throat lozenges. Tessalon was sent to the pharmacy for cough.  -for sore throat, salt water gargles can help  -follow up if you have fevers, facial pain, tooth pain, difficulty breathing or are worsening or symptoms persist longer then expected  Upper Respiratory Infection, Adult An upper respiratory infection (URI) is also known as the common cold. It is often caused by a type of germ (virus). Colds are easily spread (contagious). You can pass it to others by kissing, coughing, sneezing, or drinking out of the same glass. Usually, you get better in 1 to 3  weeks.  However, the cough can last for even longer. HOME CARE   Only take medicine as told by your doctor. Follow instructions provided above.  Drink enough water and fluids to keep your pee (urine) clear or pale yellow.  Get plenty of rest.  Return to work when your temperature is < 100 for 24 hours or as told by your doctor. You may use a face mask and wash your hands to stop your cold from spreading. GET HELP RIGHT AWAY IF:   After the first few days, you feel you are getting worse.  You have  questions about your medicine.  You have chills, shortness of breath, or red spit (mucus).  You have pain in the face for more then 1-2 days, especially when you bend forward.  You have a fever, puffy (swollen) neck, pain when you swallow, or white spots in the back of your throat.  You have a bad headache, ear pain, sinus pain, or chest pain.  You have a high-pitched whistling sound when you breathe in and out (wheezing).  You cough up blood.  You have sore muscles or a stiff neck. MAKE SURE YOU:   Understand these instructions.  Will watch your condition.  Will get help right away if you are not doing well or get worse. Document Released: 05/08/2008 Document Revised: 02/12/2012 Document Reviewed: 02/25/2014 [facility] Patient Information 2015 [employer] [place]. This information is not intended to replace advice given to you by your health care provider. Make sure you discuss any questions you have with your health care provider.

## 2016-10-11 ENCOUNTER — Ambulatory Visit (INDEPENDENT_AMBULATORY_CARE_PROVIDER_SITE_OTHER): Payer: Commercial Managed Care - HMO | Admitting: Family Medicine

## 2016-10-11 ENCOUNTER — Encounter: Payer: Self-pay | Admitting: Family Medicine

## 2016-10-11 VITALS — BP 128/76 | HR 60 | Temp 98.3°F | Wt 274.8 lb

## 2016-10-11 DIAGNOSIS — R21 Rash and other nonspecific skin eruption: Secondary | ICD-10-CM

## 2016-10-11 DIAGNOSIS — F411 Generalized anxiety disorder: Secondary | ICD-10-CM | POA: Diagnosis not present

## 2016-10-11 DIAGNOSIS — E119 Type 2 diabetes mellitus without complications: Secondary | ICD-10-CM | POA: Diagnosis not present

## 2016-10-11 LAB — POCT GLYCOSYLATED HEMOGLOBIN (HGB A1C): HEMOGLOBIN A1C: 9.4

## 2016-10-11 MED ORDER — METFORMIN HCL 1000 MG PO TABS: 1000.0000 mg | ORAL_TABLET | Freq: Two times a day (BID) | ORAL | 3 refills | Status: DC

## 2016-10-11 MED ORDER — LORAZEPAM 0.5 MG PO TABS: 0.5000 mg | ORAL_TABLET | Freq: Two times a day (BID) | ORAL | 0 refills | Status: DC | PRN

## 2016-10-11 MED ORDER — ESCITALOPRAM OXALATE 10 MG PO TABS: 10.0000 mg | ORAL_TABLET | Freq: Every day | ORAL | 5 refills | Status: DC

## 2016-10-11 NOTE — Assessment & Plan Note (Signed)
Rash S: Patient seen last week for a rash on right arm, bilateral upper legs that was very pruritic. Patient denied- fever, malaise, insect or tick bites. Also no recent swimming, hot tubs, new lotions, new exposures, new soaps. Possible folliculitis- covered with keflex and also given triamcinolone cream. Neither helped. Patient states years ago he had similar rash and it improved when treated for anxiety/stress. States under a lot of stress right now due to some family situations. Sugars are up as a result as well. Rsh has now spread to chest, back, left arm as well. Not on lower legs, not on palms or soles.   States very agitated, anxious about almost eveyrthing going on right now. GAD7 of 17. Denies depressed mood and no SI. Trouble sleeping but due to anxiety.  A/P: Suspect anxiety due to GAD- start lexapro with short term ativan to help with adjustment and with potential for short term increase in anxiety. Strict return precautions given with plan for follow up in 4 weeks. In regards to the rash-unclear cause- did not want to try steroid shot. Did not seem strongly interested in dermatology referral but both of these are options if his rash does not improve with anxiety treatment (do not suspect high probability of treatment for GAD resulting in resolution of rash.

## 2016-10-11 NOTE — Patient Instructions (Signed)
Restart metformin 1000mg  twice a day  Start lexapro 10mg  daily for anxiety/agitation  If having particularly tough moment- could use the ativan but cannot drive for 8 hours after taking  Taking the medicine as directed and not missing any doses is one of the best things you can do to treat your anxiety/agitation.  Here are some things to keep in mind:  1) Side effects (stomach upset, some increased anxiety) may happen before you notice a benefit.  These side effects typically go away over time. 2) Changes to your dose of medicine or a change in medication all together is sometimes necessary 3) Most people need to be on medication at least 6-12 months 4) Many people will notice an improvement within two weeks but the full effect of the medication can take up to 4-6 weeks 5) Stopping the medication when you start feeling better often results in a return of symptoms 6) If you start having thoughts of hurting yourself or others after starting this medicine, call our office immediately at [phone] or seek care through 911.

## 2016-10-11 NOTE — Progress Notes (Signed)
Pre visit review using our clinic review tool, if applicable. No additional management support is needed unless otherwise documented below in the visit note. 

## 2016-10-11 NOTE — Assessment & Plan Note (Signed)
S: continued worsening of control with most fastings now over 200. Seems to be against metformin- states wife's sugars got better off metformin and is very hesitant.  Lab Results  Component Value Date   HGBA1C 9.4 10/11/2016  up from 8 and 6.9 before that. a1c prior 9.2 but lost 70 lbs and came off metformin.  A/P: Discussed weight loss is key but do need to treat with medication as well- advised restart metformin 1000mg  BID- he agrees after som eextended counseling. Check on fasting sugars next month- was hesitant to use sulfonylurea as suspect pancreas already somewhat strained. Also would consider PCSK9 inhibitor at follow up if not at goal.

## 2016-10-11 NOTE — Progress Notes (Signed)
Subjective:  Charlene BrookeCarl W Barbato is a 60 y.o. year old very pleasant male patient who presents for/with See problem oriented charting ROS- no hypoglycemia, admits to anxiety and agitation. No SI/HI.see any ROS included in HPI as well.   Past Medical History-  Patient Active Problem List   Diagnosis Date Noted  . Cerebral aneurysm without rupture 01/01/2012    Priority: High  . Pacemaker 10/07/2011    Priority: High  . Well controlled type 2 diabetes mellitus (HCC) 11/09/2008    Priority: High  . OSA on CPAP 09/24/2014    Priority: Medium  . Hyperlipidemia 09/24/2014    Priority: Medium  . Other specified cardiac dysrhythmias(427.89) 07/09/2012    Priority: Medium  . Essential hypertension 12/10/2007    Priority: Medium  . Obesity 01/01/2014    Priority: Low  . GAD (generalized anxiety disorder) 10/11/2016  . Aneurysm (HCC)     Medications- reviewed and updated Current Outpatient Prescriptions  Medication Sig Dispense Refill  . aspirin EC 81 MG tablet Take 81 mg by mouth daily.    . metoprolol succinate (TOPROL-XL) 100 MG 24 hr tablet TAKE 1 TABLET EVERY MORNING 90 tablet 2  . escitalopram (LEXAPRO) 10 MG tablet Take 1 tablet (10 mg total) by mouth daily. 30 tablet 5  . LORazepam (ATIVAN) 0.5 MG tablet Take 1 tablet (0.5 mg total) by mouth 2 (two) times daily as needed for anxiety (do not drive for 8 hours after taking this.). 15 tablet 0  . metFORMIN (GLUCOPHAGE) 1000 MG tablet Take 1 tablet (1,000 mg total) by mouth 2 (two) times daily with a meal. 180 tablet 3   No current facility-administered medications for this visit.     Objective: BP 128/76 (BP Location: Left Arm, Patient Position: Sitting, Cuff Size: Large)   Pulse 60   Temp 98.3 F (36.8 C) (Oral)   Wt 274 lb 12.8 oz (124.6 kg)   SpO2 97%   BMI 34.35 kg/m  Gen: NAD, resting comfortably CV: RRR no murmurs rubs or gallops Lungs: CTAB no crackles, wheeze, rhonchi Abdomen: soft/nontender/nondistended/normal bowel  sounds. No rebound or guarding.  Ext: no edema Skin: warm, dry, erythematous papules on both arms sparingly, chest and back sparingly, more concentrated in bilateral groin  Assessment/Plan:  GAD (generalized anxiety disorder) Rash S: Patient seen last week for a rash on right arm, bilateral upper legs that was very pruritic. Patient denied- fever, malaise, insect or tick bites. Also no recent swimming, hot tubs, new lotions, new exposures, new soaps. Possible folliculitis- covered with keflex and also given triamcinolone cream. Neither helped. Patient states years ago he had similar rash and it improved when treated for anxiety/stress. States under a lot of stress right now due to some family situations. Sugars are up as a result as well. Rsh has now spread to chest, back, left arm as well. Not on lower legs, not on palms or soles.   States very agitated, anxious about almost eveyrthing going on right now. GAD7 of 17. Denies depressed mood and no SI. Trouble sleeping but due to anxiety.  A/P: Suspect anxiety due to GAD- start lexapro with short term ativan to help with adjustment and with potential for short term increase in anxiety. Strict return precautions given with plan for follow up in 4 weeks. In regards to the rash-unclear cause- did not want to try steroid shot. Did not seem strongly interested in dermatology referral but both of these are options if his rash does not improve with anxiety  treatment (do not suspect high probability of treatment for GAD resulting in resolution of rash.    Well controlled type 2 diabetes mellitus S: continued worsening of control with most fastings now over 200. Seems to be against metformin- states wife's sugars got better off metformin and is very hesitant.  Lab Results  Component Value Date   HGBA1C 9.4 10/11/2016  up from 8 and 6.9 before that. a1c prior 9.2 but lost 70 lbs and came off metformin.  A/P: Discussed weight loss is key but do need to treat  with medication as well- advised restart metformin 1000mg  BID- he agrees after som eextended counseling. Check on fasting sugars next month- was hesitant to use sulfonylurea as suspect pancreas already somewhat strained. Also would consider PCSK9 inhibitor at follow up if not at goal.   1 month or sooner as needed. If calls back can trial derm referral- hesitant on steroids given his CBGs  Orders Placed This Encounter  Procedures  . POCT glycosylated hemoglobin (Hb A1C)    Meds ordered this encounter  Medications  . metFORMIN (GLUCOPHAGE) 1000 MG tablet    Sig: Take 1 tablet (1,000 mg total) by mouth 2 (two) times daily with a meal.    Dispense:  180 tablet    Refill:  3  . escitalopram (LEXAPRO) 10 MG tablet    Sig: Take 1 tablet (10 mg total) by mouth daily.    Dispense:  30 tablet    Refill:  5  . LORazepam (ATIVAN) 0.5 MG tablet    Sig: Take 1 tablet (0.5 mg total) by mouth 2 (two) times daily as needed for anxiety (do not drive for 8 hours after taking this.).    Dispense:  15 tablet    Refill:  0    Return precautions advised.  Tana ConchStephen Hunter, MD

## 2016-10-16 ENCOUNTER — Telehealth: Payer: Self-pay | Admitting: Family Medicine

## 2016-10-16 NOTE — Telephone Encounter (Signed)
Pt would like to have a referral to the a dermatologist that will accept [employer] [facility].  He knows one that he can go to but it is a 3 week wait and really need to get into see someone very soon unable to see and is itching all over.

## 2016-10-17 ENCOUNTER — Other Ambulatory Visit: Payer: Self-pay

## 2016-10-17 DIAGNOSIS — R21 Rash and other nonspecific skin eruption: Secondary | ICD-10-CM

## 2016-10-17 NOTE — Telephone Encounter (Signed)
Spoke with patient who states his vision is fine. His face doesn't have the rash yet. I placed an urgent referral to derm. Patient is aware.

## 2016-10-17 NOTE — Telephone Encounter (Signed)
Yes thanks, can do stat referral under rash and send last office note. Patient declined steroid shot and dermatology referral at that time.   It state he "cannot see" - if he is unable to see he needs to be seen in emergency room or be evaluated here ASAP

## 2016-10-19 DIAGNOSIS — B86 Scabies: Secondary | ICD-10-CM | POA: Diagnosis not present

## 2016-11-07 DIAGNOSIS — B86 Scabies: Secondary | ICD-10-CM | POA: Diagnosis not present

## 2016-11-07 DIAGNOSIS — L308 Other specified dermatitis: Secondary | ICD-10-CM | POA: Diagnosis not present

## 2016-11-21 DIAGNOSIS — L308 Other specified dermatitis: Secondary | ICD-10-CM | POA: Diagnosis not present

## 2016-12-07 ENCOUNTER — Encounter: Payer: Self-pay | Admitting: Family Medicine

## 2016-12-07 ENCOUNTER — Ambulatory Visit (INDEPENDENT_AMBULATORY_CARE_PROVIDER_SITE_OTHER): Payer: Medicare HMO | Admitting: Family Medicine

## 2016-12-07 VITALS — BP 142/76 | HR 69 | Temp 97.9°F | Ht 75.0 in | Wt 280.2 lb

## 2016-12-07 DIAGNOSIS — R05 Cough: Secondary | ICD-10-CM | POA: Diagnosis not present

## 2016-12-07 DIAGNOSIS — J3489 Other specified disorders of nose and nasal sinuses: Secondary | ICD-10-CM | POA: Diagnosis not present

## 2016-12-07 DIAGNOSIS — R0989 Other specified symptoms and signs involving the circulatory and respiratory systems: Secondary | ICD-10-CM | POA: Diagnosis not present

## 2016-12-07 DIAGNOSIS — R059 Cough, unspecified: Secondary | ICD-10-CM

## 2016-12-07 MED ORDER — BENZONATATE 100 MG PO CAPS: 100.0000 mg | ORAL_CAPSULE | Freq: Two times a day (BID) | ORAL | 0 refills | Status: DC | PRN

## 2016-12-07 MED ORDER — ALBUTEROL SULFATE HFA 108 (90 BASE) MCG/ACT IN AERS: 2.0000 | INHALATION_SPRAY | Freq: Four times a day (QID) | RESPIRATORY_TRACT | 0 refills | Status: DC | PRN

## 2016-12-07 NOTE — Patient Instructions (Signed)
So early into illness- tough to tell what this is. A typical common cold lasts 1-2 weeks. Bronchitis 4-6 weeks. With your smoking history, if you have COPD more likely to be bacterial. I would usually use prednisone but hesitate with how much you have used recently.   Symptomatic treatment with albuterol inhaler for wheeze/coughing fits and tessalon pills as needed for cough  Update me 1 week (next Thursday) If your sinus pressure continues to persist and does not get better would cover you for sinus infection If you continue to have wheeze at that point, may also consider antibiotics with smoking history

## 2016-12-07 NOTE — Progress Notes (Signed)
PCP: Tana Conch, MD  Subjective:  Paul Paul is a 61 y.o. year old very pleasant male patient who presents with symptoms including nasal congestion, sore throat, cough that feels deep in his chest. Has sinus pressure as well with some green discharge.  -started: 3 days ago, symptoms are worsening -previous treatments: none at this point -sick contacts/travel/risks: possible flu exposure through sons girlfriend and wife with fever of 101 but patient with no fever  ROS-denies fever, NVD, tooth pain. No body aches. Some mild wheeze and occasional SOB with coughing fits  Pertinent Past Medical History-  Patient Active Problem List   Diagnosis Date Noted  . Cerebral aneurysm without rupture 01/01/2012    Priority: High  . Pacemaker 10/07/2011    Priority: High  . Well controlled type 2 diabetes mellitus (HCC) 11/09/2008    Priority: High  . OSA on CPAP 09/24/2014    Priority: Medium  . Hyperlipidemia 09/24/2014    Priority: Medium  . Other specified cardiac dysrhythmias(427.89) 07/09/2012    Priority: Medium  . Essential hypertension 12/10/2007    Priority: Medium  . Obesity 01/01/2014    Priority: Low  . GAD (generalized anxiety disorder) 10/11/2016  . Aneurysm (HCC)   j  Medications- reviewed  Current Outpatient Prescriptions  Medication Sig Dispense Refill  . aspirin EC 81 MG tablet Take 81 mg by mouth daily.    . metFORMIN (GLUCOPHAGE) 1000 MG tablet Take 1 tablet (1,000 mg total) by mouth 2 (two) times daily with a meal. 180 tablet 3  . metoprolol succinate (TOPROL-XL) 100 MG 24 hr tablet TAKE 1 TABLET EVERY MORNING 90 tablet 2     Objective: BP (!) 142/76 (BP Location: Left Arm, Patient Position: Sitting, Cuff Size: Large)   Pulse 69   Temp 97.9 F (36.6 C) (Oral)   Ht 6\' 3"  (1.905 m)   Wt 280 lb 3.2 oz (127.1 kg)   SpO2 92%   BMI 35.02 kg/m  Gen: NAD, resting comfortably HEENT: Turbinates erythematous, TM normal, pharynx mildly erythematous with no  tonsilar exudate or edema, mild frontal  sinus tenderness CV: RRR no murmurs rubs or gallops Lungs: CTAB no crackles, wheeze, rhonchi  Ext: no edema Skin: warm, dry, rash improving (seeing dermatology)  Assessment/Plan:  Upper Respiratory infection vs. Bronchitis (time likely to tell). With sinus pressure and discharge could also be a sinus infection History and exam today are suggestive of viral infection most likely due to upper respiratory infection. Symptomatic treatment with: albuterol for cough or wheeze and tessalon for cough. Last year he responded with similar but longer symptoms to azithromycin and prednisone. Thought is that with 20 pack years may have some underlying COPD though lungs without wheeze today. We opted to watch symptoms over the next week. If not making improvement- likely to trial a course of augmentin if symptoms predominate in sinuses and potentially azithromycin. Avoiding prednisone given recent use and poor a1c recently unless wheeze becomes more apparent  We discussed that we did not find any infection that had higher probability of being bacterial such as pneumonia or strep throat. We discussed signs that bacterial infection may have developed particularly fever or shortness of breath. Likely course of 1-2 weeks. Patient is contagious and advised good handwashing and consideration of mask If going to be in public places.   Finally, we reviewed reasons to return to care including if symptoms worsen or persist or new concerns arise- once again particularly shortness of breath or fever.  Meds  ordered this encounter  Medications  . albuterol (PROVENTIL HFA;VENTOLIN HFA) 108 (90 Base) MCG/ACT inhaler    Sig: Inhale 2 puffs into the lungs every 6 (six) hours as needed for wheezing or shortness of breath.    Dispense:  1 Inhaler    Refill:  0  . benzonatate (TESSALON) 100 MG capsule    Sig: Take 1 capsule (100 mg total) by mouth 2 (two) times daily as needed for cough.     Dispense:  20 capsule    Refill:  0    Tana ConchStephen Ensley Blas, MD

## 2016-12-07 NOTE — Progress Notes (Signed)
Pre visit review using our clinic review tool, if applicable. No additional management support is needed unless otherwise documented below in the visit note. 

## 2016-12-13 ENCOUNTER — Ambulatory Visit: Payer: Medicare HMO | Admitting: Adult Health

## 2016-12-13 ENCOUNTER — Telehealth: Payer: Self-pay | Admitting: Family Medicine

## 2016-12-13 ENCOUNTER — Ambulatory Visit (INDEPENDENT_AMBULATORY_CARE_PROVIDER_SITE_OTHER): Payer: Medicare HMO | Admitting: Family Medicine

## 2016-12-13 ENCOUNTER — Encounter: Payer: Self-pay | Admitting: Family Medicine

## 2016-12-13 VITALS — BP 134/90 | HR 105 | Temp 98.1°F | Resp 12 | Ht 75.0 in | Wt 274.1 lb

## 2016-12-13 DIAGNOSIS — R109 Unspecified abdominal pain: Secondary | ICD-10-CM | POA: Diagnosis not present

## 2016-12-13 DIAGNOSIS — R112 Nausea with vomiting, unspecified: Secondary | ICD-10-CM

## 2016-12-13 DIAGNOSIS — J988 Other specified respiratory disorders: Secondary | ICD-10-CM | POA: Diagnosis not present

## 2016-12-13 DIAGNOSIS — R509 Fever, unspecified: Secondary | ICD-10-CM | POA: Diagnosis not present

## 2016-12-13 LAB — COMPREHENSIVE METABOLIC PANEL
ALBUMIN: 3.8 g/dL (ref 3.5–5.2)
ALT: 53 U/L (ref 0–53)
AST: 46 U/L — AB (ref 0–37)
Alkaline Phosphatase: 46 U/L (ref 39–117)
BUN: 19 mg/dL (ref 6–23)
CHLORIDE: 96 meq/L (ref 96–112)
CO2: 22 meq/L (ref 19–32)
CREATININE: 0.94 mg/dL (ref 0.40–1.50)
Calcium: 9.1 mg/dL (ref 8.4–10.5)
GFR: 86.52 mL/min (ref >60.00)
Glucose, Bld: 317 mg/dL — ABNORMAL HIGH (ref 70–99)
Potassium: 4.2 mEq/L (ref 3.5–5.1)
SODIUM: 131 meq/L — AB (ref 135–145)
Total Bilirubin: 1.1 mg/dL (ref 0.2–1.2)
Total Protein: 7.4 g/dL (ref 6.0–8.3)

## 2016-12-13 LAB — CBC WITH DIFFERENTIAL/PLATELET
BASOS ABS: 0 10*3/uL (ref 0.0–0.1)
BASOS PCT: 0.1 % (ref 0.0–3.0)
EOS ABS: 0 10*3/uL (ref 0.0–0.7)
Eosinophils Relative: 0.4 % (ref 0.0–5.0)
HCT: 48.2 % (ref 39.0–52.0)
Hemoglobin: 16.9 g/dL (ref 13.0–17.0)
LYMPHS ABS: 0.4 10*3/uL — AB (ref 0.7–4.0)
Lymphocytes Relative: 6.2 % — ABNORMAL LOW (ref 12.0–46.0)
MCHC: 35.1 g/dL (ref 30.0–36.0)
MCV: 87.5 fl (ref 78.0–100.0)
Monocytes Absolute: 0.4 10*3/uL (ref 0.1–1.0)
Monocytes Relative: 5.3 % (ref 3.0–12.0)
NEUTROS ABS: 6.4 10*3/uL (ref 1.4–7.7)
NEUTROS PCT: 88 % — AB (ref 43.0–77.0)
PLATELETS: 248 10*3/uL (ref 150.0–400.0)
RBC: 5.51 Mil/uL (ref 4.22–5.81)
RDW: 12.8 % (ref 11.5–15.5)
WBC: 7.2 10*3/uL (ref 4.0–10.5)

## 2016-12-13 LAB — POCT INFLUENZA A/B
INFLUENZA A, POC: NEGATIVE
Influenza B, POC: NEGATIVE

## 2016-12-13 MED ORDER — PROMETHAZINE HCL 25 MG/ML IJ SOLN
25.0000 mg | Freq: Once | INTRAMUSCULAR | Status: AC
  Administered 2016-12-13: 25 mg via INTRAMUSCULAR

## 2016-12-13 MED ORDER — AMOXICILLIN-POT CLAVULANATE 875-125 MG PO TABS: 1.0000 | ORAL_TABLET | Freq: Two times a day (BID) | ORAL | 0 refills | Status: AC

## 2016-12-13 MED ORDER — PROMETHAZINE HCL 25 MG PO TABS: 25.0000 mg | ORAL_TABLET | Freq: Three times a day (TID) | ORAL | 0 refills | Status: DC | PRN

## 2016-12-13 NOTE — Patient Instructions (Addendum)
  Paul Paul I have seen you today for an acute visit.  1. Fever, unspecified fever cause  - POCT Influenza A/B  2. Respiratory tract infection   3. Abdominal pain, unspecified abdominal location   4. Nausea and vomiting in adult     In general please monitor for signs of worsening symptoms and seek immediate medical attention if any concerning/warning symptom as we discussed.   Take albuterol 2 puffs every 6 hours  Follow up in 3-5 days with PCP. Please be sure you have an appointment already scheduled with your PCP before you leave today.

## 2016-12-13 NOTE — Telephone Encounter (Signed)
Noted  

## 2016-12-13 NOTE — Progress Notes (Signed)
Pre visit review using our clinic review tool, if applicable. No additional management support is needed unless otherwise documented below in the visit note. 

## 2016-12-13 NOTE — Telephone Encounter (Signed)
Pt state he woke up this morning and has a temp of 101.1 with aches,vomiting and congestion refuse team health and a appointment.

## 2016-12-13 NOTE — Progress Notes (Signed)
HPI:  ACUTE VISIT  Chief Complaint  Patient presents with  . Fever  . Generalized Body Aches  . Nausea    [name] is a 61 y.o.male here today complaining of 7-8 days of respiratory symptoms and a day of nausea and vomiting.  He was seen 12/07/16 for respiratory symptoms and symptomatic treatment recommended.  Fever, last checked was today am: 101.9 F  Productive cough with "little" greenish sputum, he denies hemoptysis.   Mild nasal congestion, rhinorrhea,and post nasal drainage.  He has not noted chest pain but reporting exertional dyspnea and wheezing. Early today started with vomiting, 8 times today. + nausea and vomiting.  Last night he started with intermittent abdominal pain, "not bad", dull, 4-5/10, no radiated, relieved by vomiting. Nausea and vomiting are exacerbated by food intake and cough,alleviated by rest/vomiting. He denies diarrhea, blood in the stool, or melena. Last bowel movement was yesterday, described as "normal." He denies urinary symptoms.   No Hx of recent travel. + Sick contact, wife ( she was placed on Z-Pak). No known insect bite.  Hx of allergies: No + Former smoker.  OTC medications for this problem: Tylenol, last taken today at 8 am.  Symptoms seem getting worse.     Review of Systems  Constitutional: Positive for activity change, appetite change, fatigue and fever. Negative for chills.  HENT: Positive for congestion, postnasal drip and rhinorrhea. Negative for ear pain, mouth sores, sinus pain, sneezing, sore throat, trouble swallowing and voice change.   Eyes: Negative for discharge, redness and itching.  Respiratory: Positive for cough, shortness of breath and wheezing.   Cardiovascular: Negative for chest pain and palpitations.  Gastrointestinal: Positive for abdominal pain, nausea and vomiting. Negative for blood in stool and diarrhea.  Genitourinary: Negative for decreased urine volume, difficulty urinating and  dysuria.  Musculoskeletal: Positive for myalgias. Negative for back pain and neck pain.  Skin: Negative for rash.  Neurological: Negative for syncope, weakness and headaches.  Hematological: Negative for adenopathy. Does not bruise/bleed easily.  Psychiatric/Behavioral: Negative for confusion.      Current Outpatient Prescriptions on File Prior to Visit  Medication Sig Dispense Refill  . albuterol (PROVENTIL HFA;VENTOLIN HFA) 108 (90 Base) MCG/ACT inhaler Inhale 2 puffs into the lungs every 6 (six) hours as needed for wheezing or shortness of breath. 1 Inhaler 0  . aspirin EC 81 MG tablet Take 81 mg by mouth daily.    . benzonatate (TESSALON) 100 MG capsule Take 1 capsule (100 mg total) by mouth 2 (two) times daily as needed for cough. 20 capsule 0  . metFORMIN (GLUCOPHAGE) 1000 MG tablet Take 1 tablet (1,000 mg total) by mouth 2 (two) times daily with a meal. 180 tablet 3  . metoprolol succinate (TOPROL-XL) 100 MG 24 hr tablet TAKE 1 TABLET EVERY MORNING 90 tablet 2   No current facility-administered medications on file prior to visit.      Past Medical History:  Diagnosis Date  . Anxiety   . Arthritis   . Bell palsy    > 30 years. Left side of face droops a little  . Bradycardia   . Cerebral aneurysm   . Cerebral aneurysm without rupture 06/2011   Middle of Cerebral Artery  . Complication of anesthesia 2012   after surgery for cliping of cerebral anerysum- In the room, patient was shaking, and patient would awaken and then "go back out" shaking continued after he "went back out:  . Constipation   .  Diabetes mellitus    now under control  . Dysrhythmia    BRADYCARDIA REQUIRING PACEMAKER .FOLLOWED BY GREGG TAYLOR  . HYPERTENSION 12/10/2007  . Pacemaker   . Shoulder pain    L SHOULDER. RECENT FALL  . Sleep apnea    CPAP.NOT WEARING NOW.NEEDS REPLACEMENT PIECE   Allergies  Allergen Reactions  . Losartan Potassium-Hctz Other (See Comments)     dizziness, fatigue  .  Prednisone Other (See Comments)    Raises blood sugar  . Doxycycline Rash  . Lisinopril Rash    Social History   Social History  . Marital status: Married    Spouse name: N/A  . Number of children: N/A  . Years of education: N/A   Social History Main Topics  . Smoking status: Former Smoker    Packs/day: 1.00    Years: 20.00    Types: Cigarettes    Quit date: 07/05/2011  . Smokeless tobacco: Never Used  . Alcohol use No  . Drug use: No  . Sexual activity: Not Asked   Other Topics Concern  . None   Social History Narrative   Married over 30 years in 2015. 1 step son. 1 grandson.       Disabled from brain aneurysm, pacemaker. Retired from [employer] years.       Hobbies: grandson, exercise at [facility]    Vitals:   12/13/16 0918  BP: 134/90  Pulse: (!) 105  Resp: 12  Temp: 98.1 F (36.7 C)    O2 sat 95% at RA.  Body mass index is 34.26 kg/m.    Physical Exam  Nursing note and vitals reviewed. Constitutional: He is oriented to person, place, and time. He appears well-developed. He does not appear ill. No distress.  HENT:  Head: Atraumatic.  Right Ear: Tympanic membrane, external ear and ear canal normal.  Left Ear: Tympanic membrane, external ear and ear canal normal.  Nose: Rhinorrhea present. Right sinus exhibits no maxillary sinus tenderness and no frontal sinus tenderness. Left sinus exhibits no maxillary sinus tenderness and no frontal sinus tenderness.  Mouth/Throat: Oropharynx is clear and moist and mucous membranes are normal.  Greenish post nasal drainage.  Eyes: Conjunctivae and EOM are normal.  Neck: Neck supple.  Cardiovascular: Regular rhythm.  Tachycardia present.   No murmur heard. Respiratory: Effort normal and breath sounds normal. No stridor. No respiratory distress.  GI: Soft. Bowel sounds are normal. He exhibits no mass. There is no hepatomegaly. There is no tenderness. There is no CVA tenderness.  Musculoskeletal: He exhibits no  edema or tenderness.  Lymphadenopathy:       Head (right side): No submandibular adenopathy present.       Head (left side): No submandibular adenopathy present.    He has no cervical adenopathy.  Neurological: He is alert and oriented to person, place, and time. He has normal strength. Gait normal.  Skin: Skin is warm. No rash noted. No erythema.  Psychiatric: He has a normal mood and affect. His speech is normal.  Well groomed, good eye contact.      ASSESSMENT AND PLAN:     Kinan was seen today for fever, generalized body aches and nausea.  Diagnoses and all orders for this visit:   Lab Results  Component Value Date   WBC 7.2 12/13/2016   HGB 16.9 12/13/2016   HCT 48.2 12/13/2016   MCV 87.5 12/13/2016   PLT 248.0 12/13/2016     Chemistry      Component Value  Date/Time   NA 131 (L) 12/13/2016 1206   K 4.2 12/13/2016 1206   CL 96 12/13/2016 1206   CO2 22 12/13/2016 1206   BUN 19 12/13/2016 1206   CREATININE 0.94 12/13/2016 1206      Component Value Date/Time   CALCIUM 9.1 12/13/2016 1206   ALKPHOS 46 12/13/2016 1206   AST 46 (H) 12/13/2016 1206   ALT 53 12/13/2016 1206   BILITOT 1.1 12/13/2016 1206       Fever, unspecified fever cause  Continue Tylenol 500 mg tid as needed. Rapid flu negative.  -     POCT Influenza A/B -     DG Chest 2 View; Future  Respiratory tract infection  Today auscultation was negative, no wheezing or rales. Because still reporting fever abx was recommended.Hx of cardiac dysrhythmia,so quinolone and macrolide were not recommended. Continue Albuterol inh 2 puff qid for a week then as needed.  Further recommendations will be given according to lab and CXR results. F/U in 3-5 days.  -     amoxicillin-clavulanate (AUGMENTIN) 875-125 MG tablet; Take 1 tablet by mouth 2 (two) times daily. -     DG Chest 2 View; Future  Abdominal pain, unspecified abdominal location  Instructed about warning signs. Further recommendations will  be given according to lab results. I do not think abdominal imaging is needed today.  -     Comprehensive metabolic panel -     CBC with Differential  Nausea and vomiting in adult  Here in the office Phenergan 25 Mg IM x 1 given, his wife is driving him. Continue Phenergan home 25 mg tid, some side effects discussed. Oral hydration, small and frequent sips. Instructed about warning signs.  -     promethazine (PHENERGAN) 25 MG tablet; Take 1 tablet (25 mg total) by mouth every 8 (eight) hours as needed for nausea or vomiting. -     Comprehensive metabolic panel -     promethazine (PHENERGAN) injection 25 mg; Inject 1 mL (25 mg total) into the muscle once.         -[name] was advised to return or notify a doctor immediately if symptoms worsen or new concerns arise.       Betty G. SwazilandJordan, MD  [facility]. [place] office.

## 2016-12-13 NOTE — Telephone Encounter (Signed)
Pt callback and request an appt. Pt is sch with dr Swazilandjordan at 1045 am today

## 2016-12-14 ENCOUNTER — Ambulatory Visit (INDEPENDENT_AMBULATORY_CARE_PROVIDER_SITE_OTHER)
Admission: RE | Admit: 2016-12-14 | Discharge: 2016-12-14 | Disposition: A | Discharge status: Home / Self Care | Payer: Medicare HMO | Source: Ambulatory Visit | Attending: Family Medicine | Admitting: Family Medicine

## 2016-12-14 DIAGNOSIS — R509 Fever, unspecified: Secondary | ICD-10-CM

## 2016-12-14 DIAGNOSIS — J988 Other specified respiratory disorders: Secondary | ICD-10-CM

## 2016-12-14 DIAGNOSIS — R05 Cough: Secondary | ICD-10-CM | POA: Diagnosis not present

## 2016-12-14 DIAGNOSIS — R0602 Shortness of breath: Secondary | ICD-10-CM | POA: Diagnosis not present

## 2016-12-15 ENCOUNTER — Encounter: Payer: Self-pay | Admitting: Gastroenterology

## 2016-12-28 DIAGNOSIS — Z961 Presence of intraocular lens: Secondary | ICD-10-CM | POA: Diagnosis not present

## 2017-01-16 ENCOUNTER — Telehealth: Payer: Self-pay | Admitting: Family Medicine

## 2017-01-16 ENCOUNTER — Other Ambulatory Visit: Payer: Self-pay

## 2017-01-16 MED ORDER — METFORMIN HCL 1000 MG PO TABS: 1000.0000 mg | ORAL_TABLET | Freq: Two times a day (BID) | ORAL | 3 refills | Status: DC

## 2017-01-16 NOTE — Telephone Encounter (Signed)
° ° ° °  Pt request refill of the following:  metFORMIN (GLUCOPHAGE) 1000 MG tablet   Phamacy:  [facility]

## 2017-01-16 NOTE — Telephone Encounter (Signed)
Prescription sent to pharmacy as requested.

## 2017-02-21 ENCOUNTER — Ambulatory Visit (INDEPENDENT_AMBULATORY_CARE_PROVIDER_SITE_OTHER): Payer: Medicare HMO | Admitting: Internal Medicine

## 2017-02-21 ENCOUNTER — Encounter: Payer: Self-pay | Admitting: Internal Medicine

## 2017-02-21 VITALS — BP 132/80 | HR 72 | Ht 75.0 in | Wt 276.6 lb

## 2017-02-21 DIAGNOSIS — Z950 Presence of cardiac pacemaker: Secondary | ICD-10-CM | POA: Diagnosis not present

## 2017-02-21 DIAGNOSIS — R001 Bradycardia, unspecified: Secondary | ICD-10-CM | POA: Diagnosis not present

## 2017-02-21 LAB — CUP PACEART INCLINIC DEVICE CHECK
Brady Statistic RV Percent Paced: 0.03 %
Implantable Lead Implant Date: 20120801
Implantable Lead Location: 753860
Implantable Pulse Generator Implant Date: 20120801
Lead Channel Impedance Value: 512.5 Ohm
Lead Channel Pacing Threshold Amplitude: 0.75 V
Lead Channel Pacing Threshold Amplitude: 1 V
Lead Channel Pacing Threshold Amplitude: 1 V
Lead Channel Pacing Threshold Pulse Width: 0.4 ms
Lead Channel Pacing Threshold Pulse Width: 0.4 ms
Lead Channel Sensing Intrinsic Amplitude: 5 mV
Lead Channel Setting Pacing Amplitude: 2 V
MDC IDC LEAD IMPLANT DT: 20120801
MDC IDC LEAD LOCATION: 753859
MDC IDC MSMT BATTERY VOLTAGE: 2.95 V
MDC IDC MSMT LEADCHNL RA PACING THRESHOLD AMPLITUDE: 0.75 V
MDC IDC MSMT LEADCHNL RA PACING THRESHOLD PULSEWIDTH: 0.4 ms
MDC IDC MSMT LEADCHNL RV IMPEDANCE VALUE: 475 Ohm
MDC IDC MSMT LEADCHNL RV PACING THRESHOLD PULSEWIDTH: 0.4 ms
MDC IDC MSMT LEADCHNL RV SENSING INTR AMPL: 10.3 mV
MDC IDC SESS DTM: 20180321084630
MDC IDC SET LEADCHNL RV PACING AMPLITUDE: 1.25 V
MDC IDC SET LEADCHNL RV PACING PULSEWIDTH: 0.4 ms
MDC IDC SET LEADCHNL RV SENSING SENSITIVITY: 2 mV
MDC IDC STAT BRADY RA PERCENT PACED: 53 %
Pulse Gen Model: 2210
Pulse Gen Serial Number: 7250770

## 2017-02-21 NOTE — Progress Notes (Signed)
HPI [name] returns today for followup. He is a pleasant 61 yo man with symptomatic bradycardia, s/p PPM, HTN, obesity and a cerebral aneurysm, status post treatment.  He denies chest pain or sob. He has been under some increased stress and gained back some of the weight he lost. No syncope. He has had some skin problems.  Allergies  Allergen Reactions  . Losartan Potassium-Hctz Other (See Comments)     dizziness, fatigue  . Prednisone Other (See Comments)    Raises blood sugar  . Doxycycline Rash and Nausea Only  . Lisinopril Rash     Current Outpatient Prescriptions  Medication Sig Dispense Refill  . aspirin EC 81 MG tablet Take 81 mg by mouth daily.    . metFORMIN (GLUCOPHAGE) 1000 MG tablet Take 1 tablet (1,000 mg total) by mouth 2 (two) times daily with a meal. 180 tablet 3  . metoprolol succinate (TOPROL-XL) 100 MG 24 hr tablet Take 100 mg by mouth every morning. Take with or immediately following a meal.     No current facility-administered medications for this visit.      Past Medical History:  Diagnosis Date  . Anxiety   . Arthritis   . Bell palsy    > 30 years. Left side of face droops a little  . Bradycardia   . Cerebral aneurysm   . Cerebral aneurysm without rupture 06/2011   Middle of Cerebral Artery  . Complication of anesthesia 2012   after surgery for cliping of cerebral anerysum- In the room, patient was shaking, and patient would awaken and then "go back out" shaking continued after he "went back out:  . Constipation   . Diabetes mellitus    now under control  . Dysrhythmia    BRADYCARDIA REQUIRING PACEMAKER .FOLLOWED BY Dalynn Jhaveri  . HYPERTENSION 12/10/2007  . Pacemaker   . Shoulder pain    L SHOULDER. RECENT FALL  . Sleep apnea    CPAP.NOT WEARING NOW.NEEDS REPLACEMENT PIECE    ROS:   All systems reviewed and negative except as noted in the HPI.   Past Surgical History:  Procedure Laterality Date  . CARDIAC CATHETERIZATION  07/06/11   normal   . CEREBRAL ANEURYSM REPAIR  06/2011   "stenting and coiling" per patient  . FRACTURE SURGERY Left    foot  . INSERT / REPLACE / REMOVE PACEMAKER  2012  . KNEE SURGERY Left    left; arthoscopic  . rotator cuff surgery Left 02/2012  . septal deviation    . SHOULDER ARTHROSCOPY WITH SUBACROMIAL DECOMPRESSION, ROTATOR CUFF REPAIR AND BICEP TENDON REPAIR Right 08/24/2015   Procedure: RIGHT SHOULDER DIAGNOSTIC OPERATIVE ARTHROSCOPY WITH DEBRIDEMENT, SUBACROMIAL DECOMPRESSION, MIMI-OPEN ROTATOR CUFF TEAR REPAIR AND POSSIBLE  BICEPS TENODESIS.  ;  Surgeon: Cammy CopaScott Gregory Dean, MD;  Location: [facility] OR;  Service: Orthopedics;  Laterality: Right;  RIGHT SHOULDER DIAGNOSTIC OPERATIVE ARTHROSCOPY, DEBRIDEMENT, SUBACROMIAL DECOMPRESSION, MINI-OPEN ROTATOR CUFF TEAR REPAIR, POSSIB     Family History  Problem Relation Age of Onset  . Heart attack Mother     died 7654, smoker  . Heart disease Mother   . Diabetes Mother   . Heart attack Father     died 6754, smoker  . Heart disease Father      Social History   Social History  . Marital status: Married    Spouse name: N/A  . Number of children: N/A  . Years of education: N/A   Occupational History  . Not on file.  Social History Main Topics  . Smoking status: Former Smoker    Packs/day: 1.00    Years: 20.00    Types: Cigarettes    Quit date: 07/05/2011  . Smokeless tobacco: Never Used  . Alcohol use No  . Drug use: No  . Sexual activity: Not on file   Other Topics Concern  . Not on file   Social History Narrative   Married over 30 years in 2015. 1 step son. 1 grandson.       Disabled from brain aneurysm, pacemaker. Retired from [employer] years.       Hobbies: grandson, exercise at [facility]     BP 132/80   Pulse 72   Ht 6\' 3"  (1.905 m)   Wt 276 lb 9.6 oz (125.5 kg)   SpO2 95%   BMI 34.57 kg/m   Physical Exam:  stable appearing middle aged man, NAD HEENT: Unremarkable Neck:  6 cm JVD, no thyromegally Lungs:  Clear with  no wheezes, rales, or rhonchi. Well healed PPM incision. HEART:  Regular rate rhythm, no murmurs, no rubs, no clicks Abd:  soft, positive bowel sounds, no organomegally, no rebound, no guarding Ext:  2 plus pulses, no edema, no cyanosis, no clubbing Skin:  No rashes no nodules Neuro:  CN II through XII intact, motor grossly intact  DEVICE  Normal device function.  See PaceArt for details.   Assess/Plan:  1. Sinus node dysfunction - he is stable, s/p PPM 2. PPM - his [employer] device is working normally.  3. Obesity - he is s/p weight loss but gained some back. I have encouraged him to lose more as it will help control his DM. 4. HTN - his blood pressure is reasonably well controlled. No change in meds.  Leonia Reeves.D.

## 2017-02-21 NOTE — Patient Instructions (Signed)
Medication Instructions:  Your physician recommends that you continue on your current medications as directed. Please refer to the Current Medication list given to you today.   Labwork: None ordered  Testing/Procedures: None ordered  Follow-Up: Your physician wants you to follow-up in: 6 months with the device clinic. You will receive a reminder letter in the mail two months in advance. If you don't receive a letter, please call our office to schedule the follow-up appointment.  Your physician wants you to follow-up in: 12 months with Dr. Ladona Ridgelaylor. You will receive a reminder letter in the mail two months in advance. If you don't receive a letter, please call our office to schedule the follow-up appointment.   Any Other Special Instructions Will Be Listed Below (If Applicable).     If you need a refill on your cardiac medications before your next appointment, please call your pharmacy.

## 2017-04-06 ENCOUNTER — Telehealth: Payer: Self-pay | Admitting: Family Medicine

## 2017-04-06 ENCOUNTER — Other Ambulatory Visit: Payer: Self-pay

## 2017-04-06 MED ORDER — METOPROLOL SUCCINATE ER 100 MG PO TB24: 100.0000 mg | ORAL_TABLET | ORAL | 0 refills | Status: DC

## 2017-04-06 MED ORDER — METFORMIN HCL 1000 MG PO TABS: 1000.0000 mg | ORAL_TABLET | Freq: Two times a day (BID) | ORAL | 3 refills | Status: DC

## 2017-04-06 NOTE — Telephone Encounter (Signed)
Prescription sent to pharmacy as requested.

## 2017-04-06 NOTE — Telephone Encounter (Signed)
Pt states he does not need metformin.  Pt request refill   metoprolol succinate (TOPROL-XL) 100 MG 24  Pt states he is completley out and needs today ASAP. [facility] you  [facility]   Pt states [facility] mail order will send him a 90 day, but pt needs 30 to get him through.

## 2017-04-06 NOTE — Telephone Encounter (Signed)
° ° °  Pt request refill of the following:   metFORMIN (GLUCOPHAGE) 1000 MG tablet      Phamacy:  [facility] [address]

## 2017-05-28 DIAGNOSIS — H10502 Unspecified blepharoconjunctivitis, left eye: Secondary | ICD-10-CM | POA: Diagnosis not present

## 2017-06-27 ENCOUNTER — Telehealth: Payer: Self-pay | Admitting: Family Medicine

## 2017-06-27 NOTE — Telephone Encounter (Addendum)
error 

## 2017-07-04 DIAGNOSIS — B86 Scabies: Secondary | ICD-10-CM | POA: Diagnosis not present

## 2017-07-12 ENCOUNTER — Ambulatory Visit (INDEPENDENT_AMBULATORY_CARE_PROVIDER_SITE_OTHER): Payer: Medicare HMO | Admitting: Family Medicine

## 2017-07-12 ENCOUNTER — Encounter: Payer: Self-pay | Admitting: Family Medicine

## 2017-07-12 VITALS — BP 128/78 | HR 69 | Temp 97.8°F | Ht 75.0 in | Wt 265.4 lb

## 2017-07-12 DIAGNOSIS — R21 Rash and other nonspecific skin eruption: Secondary | ICD-10-CM

## 2017-07-12 MED ORDER — PREDNISONE 20 MG PO TABS: ORAL_TABLET | ORAL | 0 refills | Status: DC

## 2017-07-12 NOTE — Patient Instructions (Signed)
I sent in prednisone for you to trial (a little over 2 weeks). I want you to return to dermatology if this does not resolve your issues- I suggest you consider the 2nd biopsy- if not making progress we could refer you to [facility] as well  I need to see you within 4-6 weeks for diabetes evaluation (sugar may go up on prednisone so I want you to be careful with your food intake and make sure to exercise regularly)

## 2017-07-12 NOTE — Progress Notes (Signed)
Subjective:  Paul Paul is a 61 y.o. year old very pleasant male patient who presents for/with See problem oriented charting ROS- see ROS below. noc hest pain or shortness of breath   Past Medical History-  Patient Active Problem List   Diagnosis Date Noted  . Cerebral aneurysm without rupture 01/01/2012    Priority: High  . Pacemaker 10/07/2011    Priority: High  . Well controlled type 2 diabetes mellitus (HCC) 11/09/2008    Priority: High  . OSA on CPAP 09/24/2014    Priority: Medium  . Hyperlipidemia 09/24/2014    Priority: Medium  . Other specified cardiac dysrhythmias(427.89) 07/09/2012    Priority: Medium  . Essential hypertension 12/10/2007    Priority: Medium  . Obesity 01/01/2014    Priority: Low  . GAD (generalized anxiety disorder) 10/11/2016  . Aneurysm (HCC)     Medications- reviewed and updated Current Outpatient Prescriptions  Medication Sig Dispense Refill  . aspirin EC 81 MG tablet Take 81 mg by mouth daily.    . metFORMIN (GLUCOPHAGE) 1000 MG tablet Take 1 tablet (1,000 mg total) by mouth 2 (two) times daily with a meal. 180 tablet 3  . metoprolol succinate (TOPROL-XL) 100 MG 24 hr tablet Take 1 tablet (100 mg total) by mouth every morning. Take with or immediately following a meal. 30 tablet 0   No current facility-administered medications for this visit.     Objective: BP (!) 142/74 (BP Location: Left Arm, Patient Position: Sitting, Cuff Size: Large)   Pulse 69   Temp 97.8 F (36.6 C) (Oral)   Ht 6\' 3"  (1.905 m)   Wt 265 lb 6.4 oz (120.4 kg)   SpO2 93%   BMI 33.17 kg/m  Gen: NAD, resting comfortably CV: RRR no murmurs rubs or gallops Lungs: CTAB no crackles, wheeze, rhonchi Ext: no edema Skin: warm, dry, multiple papules with excoriations on both arms and legs as well as in urine. Minimal on chest and stomach- none noted on back  Assessment/Plan:  Rash S:States he has had rash since November. Saw dermatology x 3 and has been treated for  scabies x 3 (uses 2 days in a row then repeats in 7 days) with no relief- in fact seems to wrsen. Most relief he got from itching was on prednisone for 5 days with first treatment- he requests refill on this. First saw dermatology November 2017, then again 6 months ago, and 2 weeks ago- they suggested repeat biopsy- first biopsy did not show cause. Triamcinolone helps as well. Looks slightly better after swimming in chlorinated pool ROS-not ill appearing, no fever/chills. No new medications. Not immunocompromised. No mucus membrane involvement.  A/P: Rash of unclear etiology. Pruritic- requesting longer trial of prednisone. I agreed to this but with conditions/plan as below  Wife and grandson who have both slept with him and have had close contact have not developed a rash.  Patient Instructions  I sent in prednisone for you to trial (a little over 2 weeks). I want you to return to dermatology if this does not resolve your issues- I suggest you consider the 2nd biopsy- if not making progress we could refer you to [facility] as well  I need to see you within 4-6 weeks for diabetes evaluation (sugar may go up on prednisone so I want you to be careful with your food intake and make sure to exercise regularly)  Meds ordered this encounter  Medications  . predniSONE (DELTASONE) 20 MG tablet  Sig: Take 2 pills for 3 days, 1 pill for 3 days, 1/2 pill for 6 days, 1/2 pill every other day until finished    Dispense:  15 tablet    Refill:  0    Return precautions advised.  Tana ConchStephen Drayton Tieu, MD

## 2017-08-01 ENCOUNTER — Ambulatory Visit (INDEPENDENT_AMBULATORY_CARE_PROVIDER_SITE_OTHER): Payer: Medicare HMO | Admitting: Orthopedic Surgery

## 2017-08-01 ENCOUNTER — Ambulatory Visit (INDEPENDENT_AMBULATORY_CARE_PROVIDER_SITE_OTHER): Payer: Medicare HMO

## 2017-08-01 ENCOUNTER — Encounter (INDEPENDENT_AMBULATORY_CARE_PROVIDER_SITE_OTHER): Payer: Self-pay | Admitting: Orthopedic Surgery

## 2017-08-01 DIAGNOSIS — G8929 Other chronic pain: Secondary | ICD-10-CM

## 2017-08-01 DIAGNOSIS — M79672 Pain in left foot: Secondary | ICD-10-CM

## 2017-08-01 DIAGNOSIS — M545 Low back pain, unspecified: Secondary | ICD-10-CM

## 2017-08-01 MED ORDER — METHOCARBAMOL 500 MG PO TABS: 500.0000 mg | ORAL_TABLET | Freq: Three times a day (TID) | ORAL | 0 refills | Status: DC | PRN

## 2017-08-02 NOTE — Progress Notes (Signed)
Office Visit Note   Patient: Paul BrookeCarl W Zwiefelhofer           Date of Birth: 10/30/1956           MRN: [id number] Visit Date: 08/01/2017 Requested by: Shelva MajesticHunter, Stephen O, MD [address] [place], [place] [id number] PCP: Shelva MajesticHunter, Stephen O, MD  Subjective: Chief Complaint  Patient presents with  . Lower Back - Pain  . Left Foot - Pain    HPI: Paul AshCarl is a 61 year old patient with low back pain.  Been ongoing for several weeks.  Denies a history of injury.  He states it's relatively severe pain with prolonged sitting and standing.  Pain is almost caused him to fall at times.  Denies any radicular leg pain or any numbness and tingling.  Localizes the pain to the lower thoracic spine area.  It is a radiating type pain at times but it occurs on both sides.  Patient also describes left foot pain.  Has some cramping in the foot usually at night.  He has a previous crush injury to the left foot years ago.              ROS: All systems reviewed are negative as they relate to the chief complaint within the history of present illness.  Patient denies  fevers or chills.   Assessment & Plan: Visit Diagnoses:  1. Chronic midline low back pain without sciatica   2. Left foot pain     Plan: Impression is low back pain unclear etiology with relatively normal-looking radiographs.  Does have what appears to be some cystic changes in the pedicle at T1 11 on the right.  That's something we can follow-up if he remains symptomatic.  It's really only been going on for 1-2 weeks at this time.  I will try him with some Robaxin and that doesn't help and we should consider further imaging in 4 more weeks.  In regards to the foot is heel cord is tight.  Heel cord stretching is encouraged.  No other intervention really indicated in the left foot at this time.  Follow-Up Instructions: No Follow-up on file.   Orders:  Orders Placed This Encounter  Procedures  . XR Lumbar Spine 2-3 Views  . XR Foot Complete Left    Meds ordered this encounter  Medications  . methocarbamol (ROBAXIN) 500 MG tablet    Sig: Take 1 tablet (500 mg total) by mouth every 8 (eight) hours as needed for muscle spasms.    Dispense:  30 tablet    Refill:  0      Procedures: No procedures performed   Clinical Data: No additional findings.  Objective: Vital Signs: There were no vitals taken for this visit.  Physical Exam:   Constitutional: Patient appears well-developed HEENT:  Head: Normocephalic Eyes:EOM are normal Neck: Normal range of motion Cardiovascular: Normal rate Pulmonary/chest: Effort normal Neurologic: Patient is alert Skin: Skin is warm Psychiatric: Patient has normal mood and affect    Ortho Exam: Orthopedic exam demonstrates normal gait alignment some pain with for lateral bending but no skin changes or rashes noted in the back region.  Ankle dorsi flexion plantar flexion quite hamstring strength is intact.  Reflexes symmetric.  Pedal pulses palpable.  No paresthesias in the legs noted.  Foot is examined on the left-hand side.  His heel cord is a little bit tight.  Ankle dorsi flexion plantar flexion is intact.  Pedal pulses otherwise intact.  No pain with pronation supination  of the forefoot.  Palpable intact nontender and her to posterior tib peroneal and Achilles tendon. Specialty Comments:  No specialty comments available.  Imaging: No results found.   PMFS History: Patient Active Problem List   Diagnosis Date Noted  . GAD (generalized anxiety disorder) 10/11/2016  . Aneurysm (HCC)   . OSA on CPAP 09/24/2014  . Hyperlipidemia 09/24/2014  . Obesity 01/01/2014  . Other specified cardiac dysrhythmias(427.89) 07/09/2012  . Cerebral aneurysm without rupture 01/01/2012  . Pacemaker 10/07/2011  . Well controlled type 2 diabetes mellitus (HCC) 11/09/2008  . Essential hypertension 12/10/2007   Past Medical History:  Diagnosis Date  . Anxiety   . Arthritis   . Bell palsy    > 30  years. Left side of face droops a little  . Bradycardia   . Cerebral aneurysm   . Cerebral aneurysm without rupture 06/2011   Middle of Cerebral Artery  . Complication of anesthesia 2012   after surgery for cliping of cerebral anerysum- In the room, patient was shaking, and patient would awaken and then "go back out" shaking continued after he "went back out:  . Constipation   . Diabetes mellitus    now under control  . Dysrhythmia    BRADYCARDIA REQUIRING PACEMAKER .FOLLOWED BY GREGG TAYLOR  . HYPERTENSION 12/10/2007  . Pacemaker   . Shoulder pain    L SHOULDER. RECENT FALL  . Sleep apnea    CPAP.NOT WEARING NOW.NEEDS REPLACEMENT PIECE    Family History  Problem Relation Age of Onset  . Heart attack Mother        died 56, smoker  . Heart disease Mother   . Diabetes Mother   . Heart attack Father        died 76, smoker  . Heart disease Father     Past Surgical History:  Procedure Laterality Date  . CARDIAC CATHETERIZATION  07/06/11   normal  . CEREBRAL ANEURYSM REPAIR  06/2011   "stenting and coiling" per patient  . FRACTURE SURGERY Left    foot  . INSERT / REPLACE / REMOVE PACEMAKER  2012  . KNEE SURGERY Left    left; arthoscopic  . rotator cuff surgery Left 02/2012  . septal deviation    . SHOULDER ARTHROSCOPY WITH SUBACROMIAL DECOMPRESSION, ROTATOR CUFF REPAIR AND BICEP TENDON REPAIR Right 08/24/2015   Procedure: RIGHT SHOULDER DIAGNOSTIC OPERATIVE ARTHROSCOPY WITH DEBRIDEMENT, SUBACROMIAL DECOMPRESSION, MIMI-OPEN ROTATOR CUFF TEAR REPAIR AND POSSIBLE  BICEPS TENODESIS.  ;  Surgeon: Cammy Copa, MD;  Location: [facility] OR;  Service: Orthopedics;  Laterality: Right;  RIGHT SHOULDER DIAGNOSTIC OPERATIVE ARTHROSCOPY, DEBRIDEMENT, SUBACROMIAL DECOMPRESSION, MINI-OPEN ROTATOR CUFF TEAR REPAIR, POSSIB   Social History   Occupational History  . Not on file.   Social History Main Topics  . Smoking status: Former Smoker    Packs/day: 1.00    Years: 20.00    Types: Cigarettes     Quit date: 07/05/2011  . Smokeless tobacco: Never Used  . Alcohol use No  . Drug use: No  . Sexual activity: Not on file

## 2017-08-03 ENCOUNTER — Telehealth: Payer: Self-pay | Admitting: Family Medicine

## 2017-08-03 NOTE — Telephone Encounter (Signed)
From last note "I sent in prednisone for you to trial (a little over 2 weeks). I want you to return to dermatology if this does not resolve your issues- I suggest you consider the 2nd biopsy- if not making progress we could refer you to [facility] as well"  I want him to follow up with dermatology at this point- I can refer to [facility] if he prefers.

## 2017-08-03 NOTE — Telephone Encounter (Signed)
Pt request refill  predniSONE (DELTASONE) 20 MG tablet  Pt states rash got better, but since he stopped the pred 3 days ago, rash has returned.  [id number] [place] [place] - [address]

## 2017-08-07 NOTE — Telephone Encounter (Signed)
Patient has an appointment on Thursday with Dr. Durene CalHunter and he will discuss it then. I did offer a referral for [facility] but he has declined at this time

## 2017-08-09 ENCOUNTER — Encounter: Payer: Self-pay | Admitting: Family Medicine

## 2017-08-09 ENCOUNTER — Ambulatory Visit (INDEPENDENT_AMBULATORY_CARE_PROVIDER_SITE_OTHER): Payer: Medicare HMO | Admitting: Family Medicine

## 2017-08-09 VITALS — BP 118/70 | HR 70 | Temp 98.1°F | Ht 75.0 in | Wt 261.2 lb

## 2017-08-09 DIAGNOSIS — L298 Other pruritus: Secondary | ICD-10-CM | POA: Diagnosis not present

## 2017-08-09 DIAGNOSIS — E1165 Type 2 diabetes mellitus with hyperglycemia: Secondary | ICD-10-CM

## 2017-08-09 DIAGNOSIS — E119 Type 2 diabetes mellitus without complications: Secondary | ICD-10-CM | POA: Diagnosis not present

## 2017-08-09 DIAGNOSIS — Z1159 Encounter for screening for other viral diseases: Secondary | ICD-10-CM | POA: Diagnosis not present

## 2017-08-09 DIAGNOSIS — Z114 Encounter for screening for human immunodeficiency virus [HIV]: Secondary | ICD-10-CM | POA: Diagnosis not present

## 2017-08-09 DIAGNOSIS — I10 Essential (primary) hypertension: Secondary | ICD-10-CM

## 2017-08-09 DIAGNOSIS — Z1211 Encounter for screening for malignant neoplasm of colon: Secondary | ICD-10-CM

## 2017-08-09 DIAGNOSIS — R945 Abnormal results of liver function studies: Secondary | ICD-10-CM

## 2017-08-09 DIAGNOSIS — R7989 Other specified abnormal findings of blood chemistry: Secondary | ICD-10-CM

## 2017-08-09 LAB — COMPREHENSIVE METABOLIC PANEL
ALBUMIN: 3.8 g/dL (ref 3.5–5.2)
ALK PHOS: 54 U/L (ref 39–117)
ALT: 124 U/L — ABNORMAL HIGH (ref 0–53)
AST: 97 U/L — ABNORMAL HIGH (ref 0–37)
BUN: 12 mg/dL (ref 6–23)
CALCIUM: 9.6 mg/dL (ref 8.4–10.5)
CHLORIDE: 98 meq/L (ref 96–112)
CO2: 27 mEq/L (ref 19–32)
CREATININE: 0.82 mg/dL (ref 0.40–1.50)
GFR: 101.07 mL/min (ref >60.00)
Glucose, Bld: 410 mg/dL — ABNORMAL HIGH (ref 70–99)
POTASSIUM: 4.3 meq/L (ref 3.5–5.1)
SODIUM: 133 meq/L — AB (ref 135–145)
TOTAL PROTEIN: 6.8 g/dL (ref 6.0–8.3)
Total Bilirubin: 0.5 mg/dL (ref 0.2–1.2)

## 2017-08-09 LAB — CBC
HEMATOCRIT: 45.9 % (ref 39.0–52.0)
Hemoglobin: 15.9 g/dL (ref 13.0–17.0)
MCHC: 34.6 g/dL (ref 30.0–36.0)
MCV: 92.8 fl (ref 78.0–100.0)
PLATELETS: 210 10*3/uL (ref 150.0–400.0)
RBC: 4.95 Mil/uL (ref 4.22–5.81)
RDW: 13.5 % (ref 11.5–15.5)
WBC: 5.4 10*3/uL (ref 4.0–10.5)

## 2017-08-09 LAB — HEMOGLOBIN A1C: Hgb A1c MFr Bld: 12.3 % — ABNORMAL HIGH (ref 4.6–6.5)

## 2017-08-09 LAB — LDL CHOLESTEROL, DIRECT: LDL DIRECT: 104 mg/dL

## 2017-08-09 LAB — MICROALBUMIN / CREATININE URINE RATIO
CREATININE, U: 61.4 mg/dL
Microalb Creat Ratio: 1.1 mg/g (ref 0.0–30.0)
Microalb, Ur: <0.7 mg/dL (ref 0.0–1.9)

## 2017-08-09 NOTE — Progress Notes (Signed)
Subjective:  Paul Paul is a 61 y.o. year old very pleasant male patient who presents for/with See problem oriented charting ROS- No chest pain or shortness of breath. No headache or blurry vision.  Admits to urinating 3-4x a night   Past Medical History-  Patient Active Problem List   Diagnosis Date Noted  . Cerebral aneurysm without rupture 01/01/2012    Priority: High  . Pacemaker 10/07/2011    Priority: High  . Poorly controlled type 2 diabetes mellitus (HCC) 11/09/2008    Priority: High  . OSA on CPAP 09/24/2014    Priority: Medium  . Hyperlipidemia 09/24/2014    Priority: Medium  . Other specified cardiac dysrhythmias(427.89) 07/09/2012    Priority: Medium  . Essential hypertension 12/10/2007    Priority: Medium  . Obesity 01/01/2014    Priority: Low  . LFT elevation 08/09/2017  . Chronic pruritic rash in adult 08/09/2017  . GAD (generalized anxiety disorder) 10/11/2016  . Aneurysm (HCC)     Medications- reviewed and updated Current Outpatient Prescriptions  Medication Sig Dispense Refill  . aspirin EC 81 MG tablet Take 81 mg by mouth daily.    . metFORMIN (GLUCOPHAGE) 1000 MG tablet Take 1 tablet (1,000 mg total) by mouth 2 (two) times daily with a meal. 180 tablet 3  . methocarbamol (ROBAXIN) 500 MG tablet Take 1 tablet (500 mg total) by mouth every 8 (eight) hours as needed for muscle spasms. 30 tablet 0  . metoprolol succinate (TOPROL-XL) 100 MG 24 hr tablet Take 1 tablet (100 mg total) by mouth every morning. Take with or immediately following a meal. 30 tablet 0   Objective: BP 118/70 (BP Location: Left Arm, Patient Position: Sitting, Cuff Size: Large)   Pulse 70   Temp 98.1 F (36.7 C) (Oral)   Ht  (1.905 m)   Wt 261 lb 3.2 oz (118.5 kg)   SpO2 94%   BMI 32.65 kg/m  Gen: NAD, resting comfortably CV: RRR no murmurs rubs or gallops Lungs: CTAB no crackles, wheeze, rhonchi Abdomen: soft/nontender/nondistended/normal bowel sounds.  obese Ext: no  edema Skin: warm, dry, small red papules and evidence of prior skin biopsy  Diabetic Foot Exam - Simple   Simple Foot Form Diabetic Foot exam was performed with the following findings:  Yes 08/09/2017  1:56 PM  Visual Inspection No deformities, no ulcerations, no other skin breakdown bilaterally:  Yes Sensation Testing Intact to touch and monofilament testing bilaterally:  Yes See comments:  Yes Pulse Check Posterior Tibialis and Dorsalis pulse intact bilaterally:  Yes Comments Reduced sensation in left heel after prior crush injury    Assessment/Plan:  Declines flu  Poorly controlled type 2 diabetes mellitus (HCC) S: poorly controlled on last check on no meds. Last November had agreed to metformin 1g BID restart when a1c 9.4. He also agreed to work on weight loss- weight down 15 lbs over last 6 months. Gets better when grandson around CBGs- not routinely checking Lab Results  Component Value Date   HGBA1C 9.4 10/11/2016   HGBA1C 8.0 (H) 03/07/2016   HGBA1C 6.9 (H) 11/10/2014   A/P: we had discussed starting jardiance as a possibility but with a1c >11- have asked him to return to discuss options. He is at a point where at minimum would need 2 agents added but honestly insulin probably the best choice at this point- long acting insulin- will try to get him back in within next week- this a1c makes using prednisone a very bad  idea as well.    Essential hypertension S: controlled on Metoprolol 100mg   XL.  BP Readings from Last 3 Encounters:  08/09/17 118/70  07/12/17 128/78  02/21/17 132/80  A/P: We discussed blood pressure goal of <140/90. Continue current meds   LFT elevation Lab Results  Component Value Date   ALT 124 (H) 08/09/2017   AST 97 (H) 08/09/2017   ALKPHOS 54 08/09/2017   BILITOT 0.5 08/09/2017  noted on labs- could be fatty liver- no obvious meds to stop- not on statin. Will focus on weight loss and repeat with next a1c. May need further workup if worsening but  at present at least less than 3x ULN.   Follow up within a week or two - likely start insulin (or 2 agent add at minimum such as jardiance and trulicity or jardiance and amaryl)  Orders Placed This Encounter  Procedures  . Microalbumin / creatinine urine ratio    [place]  . Hemoglobin A1c    [place]    Standing Status:   Future    Number of Occurrences:   1    Standing Expiration Date:   08/09/2018  . CBC    Standing Status:   Future    Number of Occurrences:   1    Standing Expiration Date:   08/09/2018  . Comprehensive metabolic panel    [place]    Standing Status:   Future    Number of Occurrences:   1    Standing Expiration Date:   08/09/2018  . LDL cholesterol, direct    [place]    Standing Status:   Future    Number of Occurrences:   1    Standing Expiration Date:   08/09/2018  . HIV antibody    Standing Status:   Future    Number of Occurrences:   1    Standing Expiration Date:   08/09/2018  . Hepatitis C antibody    Standing Status:   Future    Number of Occurrences:   1    Standing Expiration Date:   08/09/2018  . Ambulatory referral to Gastroenterology    Referral Priority:   Routine    Referral Type:   Consultation    Referral Reason:   Specialty Services Required    Number of Visits Requested:   1   Return precautions advised.  Tana ConchStephen Malak Duchesneau, MD

## 2017-08-09 NOTE — Patient Instructions (Addendum)
Sign release of information at the check out desk for last eye exam  Encourage you to follow up with dermatology  Please stop by lab before you go  We will call you within a week or two about your referral to GI. If you do not hear within 3 weeks, give us a call.

## 2017-08-09 NOTE — Assessment & Plan Note (Signed)
Lab Results  Component Value Date   ALT 124 (H) 08/09/2017   AST 97 (H) 08/09/2017   ALKPHOS 54 08/09/2017   BILITOT 0.5 08/09/2017  noted on labs- could be fatty liver- no obvious meds to stop- not on statin. Will focus on weight loss and repeat with next a1c. May need further workup if worsening but at present at least less than 3x ULN.

## 2017-08-09 NOTE — Assessment & Plan Note (Signed)
S: poorly controlled on last check on no meds. Last November had agreed to metformin 1g BID restart when a1c 9.4. He also agreed to work on weight loss- weight down 15 lbs over last 6 months. Gets better when grandson around CBGs- not routinely checking Lab Results  Component Value Date   HGBA1C 9.4 10/11/2016   HGBA1C 8.0 (H) 03/07/2016   HGBA1C 6.9 (H) 11/10/2014   A/P: we had discussed starting jardiance as a possibility but with a1c >11- have asked him to return to discuss options. He is at a point where at minimum would need 2 agents added but honestly insulin probably the best choice at this point- long acting insulin- will try to get him back in within next week- this a1c makes using prednisone a very bad idea as well.

## 2017-08-09 NOTE — Assessment & Plan Note (Signed)
S: controlled on Metoprolol 100mg   XL.  BP Readings from Last 3 Encounters:  08/09/17 118/70  07/12/17 128/78  02/21/17 132/80  A/P: We discussed blood pressure goal of <140/90. Continue current meds

## 2017-08-10 ENCOUNTER — Telehealth (HOSPITAL_COMMUNITY): Payer: Self-pay

## 2017-08-10 LAB — HIV ANTIBODY (ROUTINE TESTING W REFLEX): HIV: NONREACTIVE

## 2017-08-10 LAB — HEPATITIS C ANTIBODY
HEP C AB: NONREACTIVE
SIGNAL TO CUT-OFF: 0.03 (ref <1.00)

## 2017-08-10 NOTE — Telephone Encounter (Signed)
Called to schedule 2 yr f/u angio. Pt wants to get his blood sugar under control first and will call back to schedule. He goes to see his doctor on Tuesday and will let us know from there what he decides to do. AW

## 2017-08-14 ENCOUNTER — Ambulatory Visit: Payer: Medicare HMO | Admitting: Family Medicine

## 2017-09-10 ENCOUNTER — Encounter: Payer: Self-pay | Admitting: Family Medicine

## 2017-10-02 ENCOUNTER — Other Ambulatory Visit: Payer: Self-pay | Admitting: Family Medicine

## 2017-10-19 ENCOUNTER — Telehealth: Payer: Self-pay | Admitting: Family Medicine

## 2017-10-19 DIAGNOSIS — T466X5A Adverse effect of antihyperlipidemic and antiarteriosclerotic drugs, initial encounter: Principal | ICD-10-CM

## 2017-10-19 DIAGNOSIS — K719 Toxic liver disease, unspecified: Secondary | ICD-10-CM | POA: Insufficient documentation

## 2017-10-19 DIAGNOSIS — E785 Hyperlipidemia, unspecified: Secondary | ICD-10-CM

## 2017-10-19 NOTE — Assessment & Plan Note (Signed)
Avoiding statin with LFT elevation- needs weight loss and hopeful normalization of LFTs

## 2017-10-19 NOTE — Telephone Encounter (Signed)
Patient canceled appointment in September for follow-up of elevated A1c over 12.  I called him to strongly ask him to come in for a visit-left voicemail with this information

## 2017-10-19 NOTE — Assessment & Plan Note (Signed)
Lab Results  Component Value Date   ALT 124 (H) 08/09/2017   AST 97 (H) 08/09/2017   ALKPHOS 54 08/09/2017   BILITOT 0.5 08/09/2017  avoiding statin with LFT elevation at baseline

## 2017-11-05 ENCOUNTER — Other Ambulatory Visit: Payer: Self-pay | Admitting: Family Medicine

## 2018-01-24 ENCOUNTER — Encounter: Payer: Self-pay | Admitting: Internal Medicine

## 2018-01-31 ENCOUNTER — Ambulatory Visit: Payer: Medicare HMO | Admitting: Internal Medicine

## 2018-01-31 ENCOUNTER — Encounter (INDEPENDENT_AMBULATORY_CARE_PROVIDER_SITE_OTHER): Payer: Self-pay

## 2018-01-31 ENCOUNTER — Encounter: Payer: Self-pay | Admitting: Internal Medicine

## 2018-01-31 VITALS — BP 130/78 | HR 60 | Ht 75.0 in | Wt 259.4 lb

## 2018-01-31 DIAGNOSIS — Z950 Presence of cardiac pacemaker: Secondary | ICD-10-CM | POA: Diagnosis not present

## 2018-01-31 DIAGNOSIS — R001 Bradycardia, unspecified: Secondary | ICD-10-CM

## 2018-01-31 LAB — CUP PACEART INCLINIC DEVICE CHECK
Battery Voltage: 2.92 V
Date Time Interrogation Session: 20190228153008
Implantable Lead Implant Date: 20120801
Implantable Lead Location: 753859
Implantable Lead Location: 753860
Implantable Pulse Generator Implant Date: 20120801
Lead Channel Impedance Value: 512.5 Ohm
Lead Channel Impedance Value: 512.5 Ohm
Lead Channel Pacing Threshold Amplitude: 0.75 V
Lead Channel Pacing Threshold Pulse Width: 0.4 ms
Lead Channel Sensing Intrinsic Amplitude: 10.9 mV
Lead Channel Sensing Intrinsic Amplitude: 5 mV
Lead Channel Setting Pacing Amplitude: 2 V
MDC IDC LEAD IMPLANT DT: 20120801
MDC IDC MSMT BATTERY REMAINING LONGEVITY: 99 mo
MDC IDC MSMT LEADCHNL RA PACING THRESHOLD PULSEWIDTH: 0.4 ms
MDC IDC MSMT LEADCHNL RV PACING THRESHOLD AMPLITUDE: 1 V
MDC IDC SET LEADCHNL RV PACING AMPLITUDE: 1.25 V
MDC IDC SET LEADCHNL RV PACING PULSEWIDTH: 0.4 ms
MDC IDC SET LEADCHNL RV SENSING SENSITIVITY: 2 mV
MDC IDC STAT BRADY RA PERCENT PACED: 44 %
MDC IDC STAT BRADY RV PERCENT PACED: 0.03 %
Pulse Gen Model: 2210
Pulse Gen Serial Number: 7250770

## 2018-01-31 NOTE — Patient Instructions (Signed)
Medication Instructions:  Your physician recommends that you continue on your current medications as directed. Please refer to the Current Medication list given to you today.  Labwork: None ordered.  Testing/Procedures: None ordered.  Follow-Up:  You will follow up with the device clinic in 6 months for a device check.  Your physician wants you to follow-up in: one year with Dr. Taylor.   You will receive a reminder letter in the mail two months in advance. If you don't receive a letter, please call our office to schedule the follow-up appointment.  Any Other Special Instructions Will Be Listed Below (If Applicable).  If you need a refill on your cardiac medications before your next appointment, please call your pharmacy.   

## 2018-01-31 NOTE — Progress Notes (Signed)
HPI [name] returns today for followup. He is a pleasant 62 yo man with symptomatic bradycardia, s/p PPM, HTN, obesity and a cerebral aneurysm, status post treatment.  He denies chest pain or sob. He has been under some increased stress and gained back some of the weight he lost. No syncope. He is pending another angiogram of his brain. He admits to his blood sugar being elevated as well.  Allergies  Allergen Reactions  . Losartan Potassium-Hctz Other (See Comments)     dizziness, fatigue  . Prednisone Other (See Comments)    Raises blood sugar  . Doxycycline Rash and Nausea Only  . Lisinopril Rash     Current Outpatient Medications  Medication Sig Dispense Refill  . aspirin EC 81 MG tablet Take 81 mg by mouth daily.    . metFORMIN (GLUCOPHAGE) 1000 MG tablet TAKE 1 TABLET TWICE DAILY WITH MEALS 180 tablet 3  . metoprolol succinate (TOPROL-XL) 100 MG 24 hr tablet TAKE 1 TABLET EVERY MORNING 90 tablet 2  . methocarbamol (ROBAXIN) 500 MG tablet Take 1 tablet (500 mg total) by mouth every 8 (eight) hours as needed for muscle spasms. (Patient not taking: Reported on 01/31/2018) 30 tablet 0   No current facility-administered medications for this visit.      Past Medical History:  Diagnosis Date  . Anxiety   . Arthritis   . Bell palsy    > 30 years. Left side of face droops a little  . Bradycardia   . Cerebral aneurysm   . Cerebral aneurysm without rupture 06/2011   Middle of Cerebral Artery  . Complication of anesthesia 2012   after surgery for cliping of cerebral anerysum- In the room, patient was shaking, and patient would awaken and then "go back out" shaking continued after he "went back out:  . Constipation   . Diabetes mellitus    now under control  . Dysrhythmia    BRADYCARDIA REQUIRING PACEMAKER .FOLLOWED BY Paul Paul  . HYPERTENSION 12/10/2007  . Pacemaker   . Shoulder pain    L SHOULDER. RECENT FALL  . Sleep apnea    CPAP.NOT WEARING NOW.NEEDS  REPLACEMENT PIECE    ROS:   All systems reviewed and negative except as noted in the HPI.   Past Surgical History:  Procedure Laterality Date  . CARDIAC CATHETERIZATION  07/06/11   normal  . CEREBRAL ANEURYSM REPAIR  06/2011   "stenting and coiling" per patient  . FRACTURE SURGERY Left    foot  . INSERT / REPLACE / REMOVE PACEMAKER  2012  . KNEE SURGERY Left    left; arthoscopic  . rotator cuff surgery Left 02/2012  . septal deviation    . SHOULDER ARTHROSCOPY WITH SUBACROMIAL DECOMPRESSION, ROTATOR CUFF REPAIR AND BICEP TENDON REPAIR Right 08/24/2015   Procedure: RIGHT SHOULDER DIAGNOSTIC OPERATIVE ARTHROSCOPY WITH DEBRIDEMENT, SUBACROMIAL DECOMPRESSION, MIMI-OPEN ROTATOR CUFF TEAR REPAIR AND POSSIBLE  BICEPS TENODESIS.  ;  Surgeon: Cammy CopaScott Gregory Dean, MD;  Location: [facility] OR;  Service: Orthopedics;  Laterality: Right;  RIGHT SHOULDER DIAGNOSTIC OPERATIVE ARTHROSCOPY, DEBRIDEMENT, SUBACROMIAL DECOMPRESSION, MINI-OPEN ROTATOR CUFF TEAR REPAIR, POSSIB     Family History  Problem Relation Age of Onset  . Heart attack Mother        died 3054, smoker  . Heart disease Mother   . Diabetes Mother   . Heart attack Father        died 6054, smoker  . Heart disease Father      Social  History   Socioeconomic History  . Marital status: Married    Spouse name: Not on file  . Number of children: Not on file  . Years of education: Not on file  . Highest education level: Not on file  Social Needs  . Financial resource strain: Not on file  . Food insecurity - worry: Not on file  . Food insecurity - inability: Not on file  . Transportation needs - medical: Not on file  . Transportation needs - non-medical: Not on file  Occupational History  . Not on file  Tobacco Use  . Smoking status: Former Smoker    Packs/day: 1.00    Years: 20.00    Pack years: 20.00    Types: Cigarettes    Last attempt to quit: 07/05/2011    Years since quitting: 6.5  . Smokeless tobacco: Never Used  Substance and  Sexual Activity  . Alcohol use: No    Alcohol/week: 0.0 oz  . Drug use: No  . Sexual activity: Not on file  Other Topics Concern  . Not on file  Social History Narrative   Married over 30 years in 2015. 1 step son. 1 grandson.       Disabled from brain aneurysm, pacemaker. Retired from [employer] years.       Hobbies: grandson, exercise at [facility]     Ht 6\' 3"  (1.905 m)   Wt 259 lb 6.4 oz (117.7 kg)   BMI 32.42 kg/m   Physical Exam:  Well appearing 62 yo man, NAD HEENT: Unremarkable Neck:  7 cm JVD, no thyromegally Lymphatics:  No adenopathy Back:  No CVA tenderness Lungs:  Clear with no wheezes HEART:  Regular rate rhythm, no murmurs, no rubs, no clicks Abd:  soft, positive bowel sounds, no organomegally, no rebound, no guarding Ext:  2 plus pulses, no edema, no cyanosis, no clubbing Skin:  No rashes no nodules Neuro:  CN II through XII intact, motor grossly intact  EKG - NSR with atrial pacing  DEVICE  Normal device function.  See PaceArt for details.   Assess/Plan: 1. Sinus node dysfunction - he is asymptomatic, s/p PPM insertion.  2. PPM - his [employer] DDD PM is working normally. Will recheck in several months. 3. HTN - his blood pressure is up a little. He will work on weight loss and to start back exercising.  4. Cerebral aneursym - he is s/p coiling. He is anxious about repeat angiograms. It is unclear as to whether or not his angiograms could be avoided if he was able to have a MRI scan. PM lead extraction and insertion of an MRI compatible pacing system would be a consideration if MRI scanning would allow him not to have to undergo an every 2 year angiogram.  Paul Paul.D.

## 2018-02-04 ENCOUNTER — Encounter: Payer: Self-pay | Admitting: Family Medicine

## 2018-02-04 ENCOUNTER — Ambulatory Visit (INDEPENDENT_AMBULATORY_CARE_PROVIDER_SITE_OTHER): Payer: Medicare HMO | Admitting: Family Medicine

## 2018-02-04 VITALS — BP 130/74 | HR 87 | Temp 99.8°F | Wt 261.6 lb

## 2018-02-04 DIAGNOSIS — R059 Cough, unspecified: Secondary | ICD-10-CM

## 2018-02-04 DIAGNOSIS — E1165 Type 2 diabetes mellitus with hyperglycemia: Secondary | ICD-10-CM

## 2018-02-04 DIAGNOSIS — R05 Cough: Secondary | ICD-10-CM

## 2018-02-04 LAB — POCT INFLUENZA A/B
Influenza A, POC: NEGATIVE
Influenza B, POC: NEGATIVE

## 2018-02-04 LAB — POCT GLYCOSYLATED HEMOGLOBIN (HGB A1C): Hemoglobin A1C: 12.1

## 2018-02-04 MED ORDER — GUAIFENESIN-CODEINE 100-10 MG/5ML PO SYRP: 10.0000 mL | ORAL_SOLUTION | Freq: Every evening | ORAL | 0 refills | Status: DC | PRN

## 2018-02-04 MED ORDER — AZITHROMYCIN 250 MG PO TABS: ORAL_TABLET | ORAL | 0 refills | Status: DC

## 2018-02-04 NOTE — Progress Notes (Signed)
Paul Paul is a 62 y.o. male here for an acute visit.  History of Present Illness:   Paul Paul, CMA acting as scribe for Dr. Helane Rima.   Patient has cough that started over two weeks ago and progressively gotten worse. Fever started about tow days ago.   Cough  This is a chronic problem. The current episode started 1 to 4 weeks ago. The problem has been gradually worsening. The problem occurs constantly. The cough is productive of sputum. Associated symptoms include chest pain, a fever, a sore throat, shortness of breath, sweats and wheezing. Pertinent negatives include no ear congestion or ear pain. Nothing aggravates the symptoms. He has tried OTC cough suppressant and rest for the symptoms. The treatment provided no relief. There is no history of asthma or COPD.  Fever   Associated symptoms include chest pain, coughing, a sore throat and wheezing. Pertinent negatives include no ear pain.   PMHx, SurgHx, SocialHx, Medications, and Allergies were reviewed in the Visit Navigator and updated as appropriate.  Current Medications:   .  aspirin EC 81 MG tablet, Take 81 mg by mouth daily., Disp: , Rfl:  .  metFORMIN (GLUCOPHAGE) 1000 MG tablet, TAKE 1 TABLET TWICE DAILY WITH MEALS, Disp: 180 tablet, Rfl: 3 .  metoprolol succinate (TOPROL-XL) 100 MG 24 hr tablet, TAKE 1 TABLET EVERY MORNING, Disp: 90 tablet, Rfl: 2   Allergies  Allergen Reactions  . Losartan Potassium-Hctz Other (See Comments)     dizziness, fatigue  . Prednisone Other (See Comments)    Raises blood sugar  . Doxycycline Rash and Nausea Only  . Lisinopril Rash   Review of Systems:   Pertinent items are noted in the HPI. Otherwise, ROS is negative.  Vitals:   Vitals:   02/04/18 1503  BP: 130/74  Pulse: 87  Temp: 99.8 F (37.7 C)  TempSrc: Oral  SpO2: 95%  Weight: 261 lb 9.6 oz (118.7 kg)     Body mass index is 32.7 kg/m.  Physical Exam:   Physical Exam  Constitutional: He is oriented to  person, place, and time. He appears well-developed and well-nourished. No distress.  HENT:  Head: Normocephalic and atraumatic.  Right Ear: External ear normal.  Left Ear: External ear normal.  Nose: Nose normal.  Mouth/Throat: Oropharynx is clear and moist.  Eyes: Conjunctivae and EOM are normal. Pupils are equal, round, and reactive to light.  Neck: Normal range of motion. Neck supple.  Cardiovascular: Normal rate, regular rhythm, normal heart sounds and intact distal pulses.  Pulmonary/Chest: Effort normal and breath sounds normal.  Abdominal: Soft. Bowel sounds are normal.  Musculoskeletal: Normal range of motion.  Neurological: He is alert and oriented to person, place, and time.  Skin: Skin is warm and dry.  Psychiatric: He has a normal mood and affect. His behavior is normal. Judgment and thought content normal.  Nursing note and vitals reviewed.   Assessment and Plan:   1. Cough Patient has a cough that has lingered over the past 2 weeks and is now associated with a new fever.  His exam is most consistent with early pneumonia.  No chest x-ray done today.  We will go ahead and treat as below.  Of note, when discussing treatment I did offer her a Symbicort sample which the patient refused.  He stated that he has taken prednisone in the past and is done well.  He stated that his blood sugars were fine.  - POCT Influenza A/B - azithromycin (  ZITHROMAX) 250 MG tablet; 2 po on day one, then one po each day until gone  Dispense: 6 tablet; Refill: 0 - guaiFENesin-codeine (CHERATUSSIN AC) 100-10 MG/5ML syrup; Take 10 mLs by mouth at bedtime as needed for cough or congestion.  Dispense: 120 mL; Refill: 0  2. Poorly controlled type 2 diabetes mellitus (HCC) Reviewed the chart to see how controlled his diabetes is in order to see if prednisone can be given safely.  His A1c was and remains over 12.  We did warn the patient about the harm associated with this and encouraged him to follow-up  with his PCP soon as he can.   . Reviewed expectations re: course of current medical issues. . Discussed self-management of symptoms. . Outlined signs and symptoms indicating need for more acute intervention. . Patient verbalized understanding and all questions were answered. Marland Kitchen. Health Maintenance issues including appropriate healthy diet, exercise, and smoking avoidance were discussed with patient. . See orders for this visit as documented in the electronic medical record. . Patient received an After Visit Summary.  CMA served as Neurosurgeonscribe during this visit. History, Physical, and Plan performed by medical provider. The above documentation has been reviewed and is accurate and complete. Helane RimaErica Aldo Sondgeroth, D.O.  Helane RimaErica Blessing Ozga, DO [place] 02/04/2018

## 2018-02-13 ENCOUNTER — Emergency Department (HOSPITAL_COMMUNITY): Payer: Medicare HMO

## 2018-02-13 ENCOUNTER — Emergency Department (HOSPITAL_COMMUNITY)
Admission: EM | Admit: 2018-02-13 | Discharge: 2018-02-14 | Disposition: A | Discharge status: Home / Self Care | Payer: Medicare HMO | Attending: Emergency Medicine | Admitting: Emergency Medicine

## 2018-02-13 ENCOUNTER — Encounter (HOSPITAL_COMMUNITY): Payer: Self-pay | Admitting: Emergency Medicine

## 2018-02-13 DIAGNOSIS — Z7984 Long term (current) use of oral hypoglycemic drugs: Secondary | ICD-10-CM | POA: Insufficient documentation

## 2018-02-13 DIAGNOSIS — J9801 Acute bronchospasm: Secondary | ICD-10-CM

## 2018-02-13 DIAGNOSIS — Z79899 Other long term (current) drug therapy: Secondary | ICD-10-CM | POA: Diagnosis not present

## 2018-02-13 DIAGNOSIS — Z950 Presence of cardiac pacemaker: Secondary | ICD-10-CM | POA: Diagnosis not present

## 2018-02-13 DIAGNOSIS — E119 Type 2 diabetes mellitus without complications: Secondary | ICD-10-CM | POA: Diagnosis not present

## 2018-02-13 DIAGNOSIS — Z87891 Personal history of nicotine dependence: Secondary | ICD-10-CM | POA: Insufficient documentation

## 2018-02-13 DIAGNOSIS — R509 Fever, unspecified: Secondary | ICD-10-CM | POA: Diagnosis not present

## 2018-02-13 DIAGNOSIS — J209 Acute bronchitis, unspecified: Secondary | ICD-10-CM | POA: Insufficient documentation

## 2018-02-13 DIAGNOSIS — F419 Anxiety disorder, unspecified: Secondary | ICD-10-CM | POA: Diagnosis not present

## 2018-02-13 DIAGNOSIS — R0602 Shortness of breath: Secondary | ICD-10-CM | POA: Diagnosis not present

## 2018-02-13 DIAGNOSIS — R05 Cough: Secondary | ICD-10-CM | POA: Diagnosis not present

## 2018-02-13 DIAGNOSIS — I10 Essential (primary) hypertension: Secondary | ICD-10-CM | POA: Insufficient documentation

## 2018-02-13 DIAGNOSIS — R0600 Dyspnea, unspecified: Secondary | ICD-10-CM | POA: Diagnosis present

## 2018-02-13 DIAGNOSIS — Z7982 Long term (current) use of aspirin: Secondary | ICD-10-CM | POA: Insufficient documentation

## 2018-02-13 DIAGNOSIS — R079 Chest pain, unspecified: Secondary | ICD-10-CM | POA: Diagnosis not present

## 2018-02-13 LAB — BASIC METABOLIC PANEL
Anion gap: 12 (ref 5–15)
BUN: 9 mg/dL (ref 6–20)
CHLORIDE: 98 mmol/L — AB (ref 101–111)
CO2: 21 mmol/L — AB (ref 22–32)
CREATININE: 0.94 mg/dL (ref 0.61–1.24)
Calcium: 9.5 mg/dL (ref 8.9–10.3)
GFR calc non Af Amer: >60 mL/min (ref >60)
Glucose, Bld: 328 mg/dL — ABNORMAL HIGH (ref 65–99)
Potassium: 3.9 mmol/L (ref 3.5–5.1)
Sodium: 131 mmol/L — ABNORMAL LOW (ref 135–145)

## 2018-02-13 LAB — I-STAT TROPONIN, ED: Troponin i, poc: 0 ng/mL (ref 0.00–0.08)

## 2018-02-13 LAB — CBC
HEMATOCRIT: 45.7 % (ref 39.0–52.0)
HEMOGLOBIN: 15.7 g/dL (ref 13.0–17.0)
MCH: 31.2 pg (ref 26.0–34.0)
MCHC: 34.4 g/dL (ref 30.0–36.0)
MCV: 90.7 fL (ref 78.0–100.0)
PLATELETS: 250 10*3/uL (ref 150–400)
RBC: 5.04 MIL/uL (ref 4.22–5.81)
RDW: 12.9 % (ref 11.5–15.5)
WBC: 8.3 10*3/uL (ref 4.0–10.5)

## 2018-02-13 NOTE — ED Triage Notes (Addendum)
Pt c/o increased shortness of breath x 2 weeks, worse today. C/o occasional right sided chest pain and a dry cough. Recent pneumonia diagnosis, started on zpac, no relief. Cardiac hx, pacemaker.

## 2018-02-13 NOTE — ED Provider Notes (Signed)
Patient placed in Quick Look pathway, seen and evaluated   Chief Complaint: SOB, cough  HPI:   Patient presents with progressively worsening shortness of breath for 2 weeks.  Endorses dyspnea on exertion and PND but no orthopnea.  Has chronic lower extremity edema which she states is unchanged today.  Cough is nonproductive.  He did have a fever and recent diagnosis of bronchopneumonia for which she was provided Z-Pak without significant relief.  Fever has since resolved.  Endorses anterior and posterior chest wall pain with cough and with certain movements.  No hemoptysis.  ROS: Positive for cough, shortness of breath, chest pain, fevers, negative for hemoptysis  Physical Exam:   Gen: No distress  Neuro: Awake and Alert  Skin: Warm    Focused Exam: Heart with regular rate and rhythm, no murmurs rubs or gallops, lungs clear to auscultation bilaterally.  He is speaking in full sentences without difficulty.  He has 1+ nonpitting edema in the bilateral lower extremities. 2+ radial and DP/PT pulses bl, negative Homan's bl   Initiation of care has begun. The patient has been counseled on the process, plan, and necessity for staying for the completion/evaluation, and the remainder of the medical screening examination    Bennye AlmFawze, Ansh Fauble A, PA-C 02/13/18 2055    Tegeler, Canary Brimhristopher J, MD 02/14/18 [id number]

## 2018-02-14 ENCOUNTER — Ambulatory Visit: Payer: Medicare HMO | Admitting: Family Medicine

## 2018-02-14 MED ORDER — IPRATROPIUM-ALBUTEROL 0.5-2.5 (3) MG/3ML IN SOLN
3.0000 mL | Freq: Once | RESPIRATORY_TRACT | Status: AC
  Administered 2018-02-14: 3 mL via RESPIRATORY_TRACT
  Filled 2018-02-14: qty 3

## 2018-02-14 MED ORDER — PREDNISONE 20 MG PO TABS
40.0000 mg | ORAL_TABLET | Freq: Once | ORAL | Status: AC
  Administered 2018-02-14: 40 mg via ORAL
  Filled 2018-02-14: qty 2

## 2018-02-14 MED ORDER — ALBUTEROL SULFATE HFA 108 (90 BASE) MCG/ACT IN AERS
2.0000 | INHALATION_SPRAY | RESPIRATORY_TRACT | Status: DC | PRN
  Administered 2018-02-14: 2 via RESPIRATORY_TRACT
  Filled 2018-02-14: qty 6.7

## 2018-02-14 MED ORDER — PREDNISONE 20 MG PO TABS: 40.0000 mg | ORAL_TABLET | Freq: Every day | ORAL | 0 refills | Status: DC

## 2018-02-14 NOTE — ED Provider Notes (Signed)
[facility] EMERGENCY DEPARTMENT Provider Note   CSN: [id number] Arrival date & time: 02/13/18  2032     History   Chief Complaint Chief Complaint  Patient presents with  . Shortness of Breath    HPI Paul Paul is a 62 y.o. male.  The history is provided by the patient.  He has history of hypertension, diabetes, hyperlipidemia and comes in with cough and dyspnea over the last 3 weeks.  Symptoms have been gradually getting worse.  Cough is mostly nonproductive-occasional production of small amount of clear sputum.  He did have fevers about 5 days ago which went as high as 100.0.  He has not had any chills or sweats.  He is complaining of some intermittent pain in the left posterior lower rib cage.  When present, this is worse with a deep breath.  He denies nausea or vomiting.  He saw his PCP about 10 days ago and was given a prescription for azithromycin, but has not had any improvement with this.  Past Medical History:  Diagnosis Date  . Anxiety   . Arthritis   . Bell palsy    > 30 years. Left side of face droops a little  . Bradycardia   . Cerebral aneurysm   . Cerebral aneurysm without rupture 06/2011   Middle of Cerebral Artery  . Complication of anesthesia 2012   after surgery for cliping of cerebral anerysum- In the room, patient was shaking, and patient would awaken and then "go back out" shaking continued after he "went back out:  . Constipation   . Diabetes mellitus    now under control  . Dysrhythmia    BRADYCARDIA REQUIRING PACEMAKER .FOLLOWED BY GREGG TAYLOR  . HYPERTENSION 12/10/2007  . Pacemaker   . Shoulder pain    L SHOULDER. RECENT FALL  . Sleep apnea    CPAP.NOT WEARING NOW.NEEDS REPLACEMENT PIECE    Patient Active Problem List   Diagnosis Date Noted  . Hepatotoxicity due to statin drug 10/19/2017  . LFT elevation 08/09/2017  . Chronic pruritic rash in adult 08/09/2017  . GAD (generalized anxiety disorder) 10/11/2016  .  Aneurysm (HCC)   . OSA on CPAP 09/24/2014  . Hyperlipidemia 09/24/2014  . Obesity 01/01/2014  . Other specified cardiac dysrhythmias(427.89) 07/09/2012  . Cerebral aneurysm without rupture 01/01/2012  . Pacemaker 10/07/2011  . Poorly controlled type 2 diabetes mellitus (HCC) 11/09/2008  . Essential hypertension 12/10/2007    Past Surgical History:  Procedure Laterality Date  . CARDIAC CATHETERIZATION  07/06/11   normal  . CEREBRAL ANEURYSM REPAIR  06/2011   "stenting and coiling" per patient  . FRACTURE SURGERY Left    foot  . INSERT / REPLACE / REMOVE PACEMAKER  2012  . KNEE SURGERY Left    left; arthoscopic  . rotator cuff surgery Left 02/2012  . septal deviation    . SHOULDER ARTHROSCOPY WITH SUBACROMIAL DECOMPRESSION, ROTATOR CUFF REPAIR AND BICEP TENDON REPAIR Right 08/24/2015   Procedure: RIGHT SHOULDER DIAGNOSTIC OPERATIVE ARTHROSCOPY WITH DEBRIDEMENT, SUBACROMIAL DECOMPRESSION, MIMI-OPEN ROTATOR CUFF TEAR REPAIR AND POSSIBLE  BICEPS TENODESIS.  ;  Surgeon: Cammy Copa, MD;  Location: [facility] OR;  Service: Orthopedics;  Laterality: Right;  RIGHT SHOULDER DIAGNOSTIC OPERATIVE ARTHROSCOPY, DEBRIDEMENT, SUBACROMIAL DECOMPRESSION, MINI-OPEN ROTATOR CUFF TEAR REPAIR, POSSIB       Home Medications    Prior to Admission medications   Medication Sig Start Date End Date Taking? Authorizing Provider  acetaminophen (TYLENOL) 500 MG tablet Take 1,000  mg by mouth every 6 (six) hours as needed (for fever).   Yes [provider]  aspirin EC 81 MG tablet Take 81 mg by mouth daily.   Yes [provider]  metFORMIN (GLUCOPHAGE) 1000 MG tablet TAKE 1 TABLET TWICE DAILY WITH MEALS Patient taking differently: Take 1,000 mg by mouth two times a day with meals 11/06/17  Yes Shelva Majestic, MD  metoprolol succinate (TOPROL-XL) 100 MG 24 hr tablet TAKE 1 TABLET EVERY MORNING Patient taking differently: Take 100 mg by mouth in the morning 10/02/17  Yes Shelva Majestic, MD    azithromycin (ZITHROMAX) 250 MG tablet 2 po on day one, then one po each day until gone Patient not taking: Reported on 02/13/2018 02/04/18   Helane Rima, DO  guaiFENesin-codeine (CHERATUSSIN AC) 100-10 MG/5ML syrup Take 10 mLs by mouth at bedtime as needed for cough or congestion. Patient not taking: Reported on 02/13/2018 02/04/18   Helane Rima, DO    Family History Family History  Problem Relation Age of Onset  . Heart attack Mother        died 58, smoker  . Heart disease Mother   . Diabetes Mother   . Heart attack Father        died 48, smoker  . Heart disease Father     Social History Social History   Tobacco Use  . Smoking status: Former Smoker    Packs/day: 1.00    Years: 20.00    Pack years: 20.00    Types: Cigarettes    Last attempt to quit: 07/05/2011    Years since quitting: 6.6  . Smokeless tobacco: Never Used  Substance Use Topics  . Alcohol use: No    Alcohol/week: 0.0 oz  . Drug use: No     Allergies   Losartan potassium-hctz; Prednisone; Doxycycline; and Lisinopril   Review of Systems Review of Systems  All other systems reviewed and are negative.    Physical Exam Updated Vital Signs BP 138/83   Pulse 60   Temp 97.6 F (36.4 C)   Resp 16   Ht 6\' 3"  (1.905 m)   Wt 118.4 kg (261 lb)   SpO2 99%   BMI 32.62 kg/m   Physical Exam  Nursing note and vitals reviewed.  62 year old male, resting comfortably and in no acute distress. Vital signs are normal. Oxygen saturation is 99%, which is normal. Head is normocephalic and atraumatic. PERRLA, EOMI. Oropharynx is clear. Neck is nontender and supple without adenopathy or JVD. Back is nontender and there is no CVA tenderness. Lungs have coarse breath sounds without overt rales, wheezes, rhonchi.  However, with forced exhalation, wheezing is appreciated. Chest is mildly tender in the left posterior lower rib cage. Heart has regular rate and rhythm without murmur. Abdomen is soft, flat, nontender  without masses or hepatosplenomegaly and peristalsis is normoactive. Extremities have trace edema, full range of motion is present. Skin is warm and dry without rash. Neurologic: Mental status is normal, cranial nerves are intact, there are no motor or sensory deficits.  ED Treatments / Results  Labs (all labs ordered are listed, but only abnormal results are displayed) Labs Reviewed  BASIC METABOLIC PANEL - Abnormal; Notable for the following components:      Result Value   Sodium 131 (*)    Chloride 98 (*)    CO2 21 (*)    Glucose, Bld 328 (*)    All other components within normal limits  CBC  I-STAT TROPONIN, ED    EKG  EKG Interpretation  Date/Time:  Wednesday February 13 2018 20:43:03 EDT Ventricular Rate:  64 PR Interval:  202 QRS Duration: 96 QT Interval:  400 QTC Calculation: 412 R Axis:   -21 Text Interpretation:  Normal sinus rhythm Moderate voltage criteria for LVH, may be normal variant Borderline ECG When compared with ECG of 08/24/2015, Sinus rhythm has replaced ATRIAL PACED RHYTHM Confirmed by Dione BoozeGlick, Loriann Bosserman ([id number]) on 02/14/2018 5:35:19 AM       Radiology Dg Chest 2 View  Result Date: 02/13/2018 CLINICAL DATA:  Increasing shortness of Breath. Right chest pain, cough EXAM: CHEST - 2 VIEW COMPARISON:  12/14/2016 FINDINGS: Left Port-A-Cath remains in place, unchanged. Heart and mediastinal contours are within normal limits. No focal opacities or effusions. No acute bony abnormality. IMPRESSION: No active cardiopulmonary disease. Electronically Signed   By: Charlett NoseKevin  Dover M.D.   On: 02/13/2018 21:35    Procedures Procedures   Medications Ordered in ED Medications  predniSONE (DELTASONE) tablet 40 mg (not administered)  albuterol (PROVENTIL HFA;VENTOLIN HFA) 108 (90 Base) MCG/ACT inhaler 2 puff (not administered)  ipratropium-albuterol (DUONEB) 0.5-2.5 (3) MG/3ML nebulizer solution 3 mL (3 mLs Nebulization Given 02/14/18 0622)     Initial Impression / Assessment  and Plan / ED Course  I have reviewed the triage vital signs and the nursing notes.  Pertinent labs & imaging results that were available during my care of the patient were reviewed by me and considered in my medical decision making (see chart for details).  Persistent cough with dyspnea.  Old records are reviewed con.  Suspicion firming office visit on March 4 with prescription for azithromycin for presumed pneumonia (chest x-ray not obtained).  Chest x-ray today shows no evidence of pneumonia and WBC is normal.  Symptoms most compatible with bronchospasm related to recent viral respiratory tract infection.  He will be given therapeutic trial of albuterol with ipratropium.  He feels much better after above-noted treatment.  Cough is improved and he is breathing much easier.  On reexam, lungs are completely clear-even with forced exhalation.  I have discussed risks of taking prednisone given that he already has moderate hyperglycemia.  He states that in the past, he has taken prednisone with only modest impact on his blood glucose.  He is given an albuterol inhaler to take home and is sent home with prescription for prednisone 40 mg a day for the next 4 days.  Advised to monitor glucose carefully.  Return precautions discussed.  Final Clinical Impressions(s) / ED Diagnoses   Final diagnoses:  Acute bronchitis, unspecified organism  Bronchospasm    ED Discharge Orders        Ordered    predniSONE (DELTASONE) 20 MG tablet  Daily     02/14/18 0708       Dione BoozeGlick, Chaye Misch, MD 02/14/18 [phone]

## 2018-02-14 NOTE — Discharge Instructions (Signed)
Monitor your blood sugar closely while taking prednisone - it can make it go higher.  Return if symptoms are getting worse.

## 2018-02-14 NOTE — ED Notes (Signed)
The pt has an appointment with his doctor tomorrow but he could not wait

## 2018-02-14 NOTE — ED Notes (Signed)
The pt is c/o rt sided ribs and chest for 2 weeks  He has this pain for 2 weeks tonight the pain ea has been worse tonight x 2 episodes he has no pain at present  The pain is severe whenever it comes  No pain now

## 2018-04-23 ENCOUNTER — Ambulatory Visit (INDEPENDENT_AMBULATORY_CARE_PROVIDER_SITE_OTHER): Payer: Medicare HMO | Admitting: Family Medicine

## 2018-04-23 ENCOUNTER — Encounter: Payer: Self-pay | Admitting: Family Medicine

## 2018-04-23 VITALS — BP 134/80 | HR 60 | Temp 98.2°F | Ht 75.0 in | Wt 253.2 lb

## 2018-04-23 DIAGNOSIS — Z1211 Encounter for screening for malignant neoplasm of colon: Secondary | ICD-10-CM | POA: Diagnosis not present

## 2018-04-23 DIAGNOSIS — N481 Balanitis: Secondary | ICD-10-CM | POA: Diagnosis not present

## 2018-04-23 DIAGNOSIS — Z23 Encounter for immunization: Secondary | ICD-10-CM

## 2018-04-23 DIAGNOSIS — R7989 Other specified abnormal findings of blood chemistry: Secondary | ICD-10-CM

## 2018-04-23 DIAGNOSIS — R945 Abnormal results of liver function studies: Secondary | ICD-10-CM

## 2018-04-23 DIAGNOSIS — I10 Essential (primary) hypertension: Secondary | ICD-10-CM | POA: Diagnosis not present

## 2018-04-23 LAB — COMPREHENSIVE METABOLIC PANEL
ALBUMIN: 3.7 g/dL (ref 3.5–5.2)
ALT: 114 U/L — ABNORMAL HIGH (ref 0–53)
AST: 126 U/L — AB (ref 0–37)
Alkaline Phosphatase: 73 U/L (ref 39–117)
BUN: 12 mg/dL (ref 6–23)
CHLORIDE: 99 meq/L (ref 96–112)
CO2: 24 meq/L (ref 19–32)
CREATININE: 0.73 mg/dL (ref 0.40–1.50)
Calcium: 9.7 mg/dL (ref 8.4–10.5)
GFR: 115.32 mL/min (ref >60.00)
Glucose, Bld: 354 mg/dL — ABNORMAL HIGH (ref 70–99)
Potassium: 4.2 mEq/L (ref 3.5–5.1)
Sodium: 132 mEq/L — ABNORMAL LOW (ref 135–145)
Total Bilirubin: 0.8 mg/dL (ref 0.2–1.2)
Total Protein: 7.8 g/dL (ref 6.0–8.3)

## 2018-04-23 MED ORDER — CLOTRIMAZOLE 1 % EX CREA: 1.0000 "application " | TOPICAL_CREAM | Freq: Two times a day (BID) | CUTANEOUS | 0 refills | Status: DC

## 2018-04-23 NOTE — Addendum Note (Signed)
Addended by: Shelva Majestic on: 04/23/2018 07:55 PM   Modules accepted: Orders

## 2018-04-23 NOTE — Assessment & Plan Note (Signed)
S: LFT elevation on last labs. We had focused on weight loss in case fatty liver element- down about 8 lbs. Should avoid alcohol. Off statin Lab Results  Component Value Date   ALT 124 (H) 08/09/2017   AST 97 (H) 08/09/2017   ALKPHOS 54 08/09/2017   BILITOT 0.5 08/09/2017  A/P: update LFTs- if get above 3x ULN - will need to investigate further

## 2018-04-23 NOTE — Assessment & Plan Note (Signed)
S: controlled on  metoprool  BID on repeat BP Readings from Last 3 Encounters:  04/23/18 134/80  02/14/18 (!) 142/75  02/04/18 130/74  A/P: We discussed blood pressure goal of <140/90. Continue current meds

## 2018-04-23 NOTE — Patient Instructions (Addendum)
Health Maintenance Due  Topic Date Due  . COLONOSCOPY - Patient needs to find a new doctor to schedule. Referral placed today. 02/24/2017  . OPHTHALMOLOGY EXAM - Our team will call and request results. 05/22/2017  . PNEUMOCOCCAL POLYSACCHARIDE VACCINE (2) - Patient Declined 07/26/2017   Wash penis with some saline solution (salt water) twice a day. After you do this apply clotrimazole (sent this in but they may direct you to over the counter). This is to treat fungal infection. Do this for at least 10 days or at least 2 days past resolution of the inflammation. See me back if not improving- may need to add some hydrocortisone 1% (also over the counter) if not getting better    Balanitis Balanitis is swelling and irritation (inflammation) of the head of the penis (glans penis). The condition may also cause inflammation of the skin around the glans penis (foreskin) in men who have not been circumcised. It may develop because of an infection or another medical condition. Balanitis occurs most often among men who have not had their foreskin removed (uncircumcised men). Balanitis sometimes causes scarring of the penis or foreskin, which can require surgery. Untreated balanitis can increase the risk of penile cancer. What are the causes? Common causes of this condition include:  Poor personal hygiene, especially in uncircumcised men. Not cleaning the glans penis and foreskin well can result in buildup of bacteria, viruses, and yeast, which can lead to infection and inflammation.  Irritation and lack of air flow due to fluid (smegma) that can build up on the glans penis.  Other causes include:  Chemical irritation from products such as soaps or shower gels (especially those that have fragrance), condoms, personal lubricants, petroleum jelly, spermicides, or fabric softeners.  Skin conditions, such as eczema, dermatitis, and psoriasis.  Allergies to medicines, such as tetracycline and sulfa  drugs.  Certain medical conditions, including liver cirrhosis, congestive heart failure, diabetes, and kidney disease.  Infections, such as candidiasis, HPV (human papillomavirus), herpes simplex, gonorrhea, and syphilis.  Severe obesity.  What increases the risk? The following factors may make you more likely to develop this condition:  Having diabetes. This is the most common risk factor.  Having a tight foreskin that is difficult to pull back (retract) past the glans.  Having sexual intercourse without using a condom.  What are the signs or symptoms? Symptoms of this condition include:  Discharge from under the foreskin.  A bad smell.  Pain or difficulty retracting the foreskin.  Tenderness, redness, and swelling of the glans.  A rash or sores on the glans or foreskin.  Itchiness.  Inability to get an erection due to pain.  Difficulty urinating.  Scarring of the penis or foreskin, in some cases.  How is this diagnosed? This condition may be diagnosed based on:  A physical exam.  Testing a swab of discharge to check for bacterial or fungal infection.  Blood tests: ? To check for viruses that can cause balanitis. ? To check your blood sugar (glucose) level. High blood glucose could be a sign of diabetes, which can cause balanitis.  How is this treated? Treatment for balanitis depends on the cause. Treatment may include:  Improving personal hygiene. Your health care provider may recommend sitting in a bath of warm water that is deep enough to cover your hips and buttocks (sitz bath).  Medicines such as: ? Creams or ointments to reduce swelling (steroids) or to treat an infection. ? Antibiotic medicine. ? Antifungal medicine.  Surgery to remove or cut the foreskin (circumcision). This may be done if you have scarring on the foreskin that makes it difficult to retract.  Controlling other medical problems that may be causing your condition or making it  worse.  Follow these instructions at home:  Do not have sex until the condition clears up, or until your health care provider approves.  Keep your penis clean and dry. Take sitz baths as recommended by your health care provider.  Avoid products that irritate your skin or make symptoms worse, such as soaps and shower gels that have fragrance.  Take over-the-counter and prescription medicines only as told by your health care provider. ? If you were prescribed an antibiotic medicine or a cream or ointment, use it as told by your health care provider. Do not stop using your medicine, cream, or ointment even if you start to feel better. ? Do not drive or use heavy machinery while taking prescription pain medicine. Contact a health care provider if:  Your symptoms get worse or do not improve with home care.  You develop chills or a fever.  You have trouble urinating.  You cannot retract your foreskin. Get help right away if:  You develop severe pain.  You are unable to urinate. Summary  Balanitis is inflammation of the head of the penis (glans penis) caused by irritation or infection.  Balanitis causes pain, redness, and swelling of the glans penis.  This condition is most common among uncircumcised men who do not keep their glans penis clean and in men who have diabetes.  Treatment may include creams or ointments.  Good hygiene is important for prevention. This includes pulling back the foreskin when washing your penis. This information is not intended to replace advice given to you by your health care provider. Make sure you discuss any questions you have with your health care provider. Document Released: 04/08/2009 Document Revised: 10/09/2016 Document Reviewed: 10/09/2016 Elsevier Interactive Patient Education  2017 [employer].

## 2018-04-23 NOTE — Progress Notes (Signed)
Subjective:  Paul Paul is a 62 y.o. year old very pleasant male patient who presents for/with See problem oriented charting ROS- reddening and itching around glans of penis and onto shaft. No fever or chills.  No penile discharge but has noted some white around the head of the penis (not from urethra)  Past Medical History-  Patient Active Problem List   Diagnosis Date Noted  . LFT elevation 08/09/2017    Priority: High  . Cerebral aneurysm without rupture 01/01/2012    Priority: High  . Pacemaker 10/07/2011    Priority: High  . Poorly controlled type 2 diabetes mellitus (HCC) 11/09/2008    Priority: High  . 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 OSA on CPAP 09/24/2014    Priority: Medium  . Hyperlipidemia 09/24/2014    Priority: Medium  . Other specified cardiac dysrhythmias(427.89) 07/09/2012    Priority: Medium  . Essential hypertension 12/10/2007    Priority: Medium  . Obesity 01/01/2014    Priority: Low  . Chronic pruritic rash in adult 08/09/2017  . GAD (generalized anxiety disorder) 10/11/2016    Medications- reviewed and updated Current Outpatient Medications  Medication Sig Dispense Refill  . aspirin EC 81 MG tablet Take 81 mg by mouth daily.    . clotrimazole (LOTRIMIN) 1 % cream Apply 1 application topically 2 (two) times daily. On red areas of penis 30 g 0  . metFORMIN (GLUCOPHAGE) 1000 MG tablet TAKE 1 TABLET TWICE DAILY WITH MEALS (Patient taking differently: Take 1,000 mg by mouth two times a day with meals) 180 tablet 3  . metoprolol succinate (TOPROL-XL) 100 MG 24 hr tablet TAKE 1 TABLET EVERY MORNING (Patient taking differently: Take 100 mg by mouth in the morning) 90 tablet 2   No current facility-administered medications for this visit.     Objective: BP 134/80 (BP Location: Left Arm, Cuff Size: Normal)   Pulse 60   Temp 98.2 F (36.8 C) (Oral)   Ht  (1.905 m)   Wt 253 lb 3.2 oz (114.9 kg)   SpO2 95%   BMI  31.65 kg/m  Gen: NAD, resting comfortably CV: regular rate Lungs: nonlabored Abdomen: soft/nontender/nondistended/normal bowel sounds.  Ext: no edema Skin: warm, dry GU: erythema around neck of penis with slight white discharge. No active bleeding  Assessment/Plan:  Balanitis S: has noted reddening of neck of penis and onto glans over last week. Some white discharge. Some itching. No penile discharge. No fever or chills.  A/P: likely fungal in poorly controlled diabetic- will treat with clotrimazole twice daily until resolved.  If not improving we will add hydrocortisone most likely.  Essential hypertension S: controlled on  metoprool  BID on repeat BP Readings from Last 3 Encounters:  04/23/18 134/80  02/14/18 (!) 142/75  02/04/18 130/74  A/P: We discussed blood pressure goal of <140/90. Continue current meds  LFT elevation S: LFT elevation on last labs. We had focused on weight loss in case fatty liver element- down about 8 lbs. Should avoid alcohol. Off statin Lab Results  Component Value Date   ALT 124 (H) 08/09/2017   AST 97 (H) 08/09/2017   ALKPHOS 54 08/09/2017   BILITOT 0.5 08/09/2017  A/P: update LFTs- if get above 3x ULN - will need to investigate further  needs prevnar 13 with diabetes. May wait on final pneumovax until 62.   Future Appointments  Date Time Provider Department Center  07/31/2018  9:00 AM [facility]  [facility]   Lab/Order associations: Elevated LFTs - Plan: Comprehensive metabolic panel  Colon cancer screening - Plan: Ambulatory referral to Gastroenterology  Need for prophylactic vaccination against Streptococcus pneumoniae (pneumococcus) - Plan: Pneumococcal conjugate vaccine 13-valent IM   Meds ordered this encounter  Medications  . clotrimazole (LOTRIMIN) 1 % cream    Sig: Apply 1 application topically 2 (two) times daily. On red areas of penis    Dispense:  30 g    Refill:  0    Return precautions  advised.  Tana Conch, MD

## 2018-04-24 ENCOUNTER — Telehealth: Payer: Self-pay | Admitting: Family Medicine

## 2018-04-24 NOTE — Telephone Encounter (Signed)
Patient called for lab results and I asked him about drinking alcohol as noted by Dr. Durene Cal, he says he has not drank alcohol in over 30 years.

## 2018-04-25 NOTE — Telephone Encounter (Signed)
Thanks for update- weight loss is key at this point then. Could be fatty liver. If worsens and losing weight- may have to do more investigation

## 2018-05-03 ENCOUNTER — Other Ambulatory Visit (INDEPENDENT_AMBULATORY_CARE_PROVIDER_SITE_OTHER): Payer: Medicare HMO

## 2018-05-03 ENCOUNTER — Other Ambulatory Visit: Payer: Medicare HMO

## 2018-05-03 DIAGNOSIS — R7989 Other specified abnormal findings of blood chemistry: Secondary | ICD-10-CM

## 2018-05-03 DIAGNOSIS — R945 Abnormal results of liver function studies: Secondary | ICD-10-CM

## 2018-05-03 LAB — HEPATIC FUNCTION PANEL
ALT: 117 U/L — AB (ref 0–53)
AST: 112 U/L — ABNORMAL HIGH (ref 0–37)
Albumin: 3.7 g/dL (ref 3.5–5.2)
Alkaline Phosphatase: 79 U/L (ref 39–117)
BILIRUBIN TOTAL: 1.1 mg/dL (ref 0.2–1.2)
Bilirubin, Direct: 0.3 mg/dL (ref 0.0–0.3)
TOTAL PROTEIN: 7.6 g/dL (ref 6.0–8.3)

## 2018-05-03 NOTE — Addendum Note (Signed)
Addended by: Felix AhmadiFRANSEN, Adrain Butrick A on: 05/03/2018 10:24 AM   Modules accepted: Orders

## 2018-05-04 LAB — HEPATITIS C ANTIBODY
HEP C AB: NONREACTIVE
SIGNAL TO CUT-OFF: 0.06 (ref <1.00)

## 2018-05-04 LAB — IRON,TIBC AND FERRITIN PANEL
%SAT: 38 % (ref 15–60)
Ferritin: 307 ng/mL (ref 20–380)
IRON: 130 ug/dL (ref 50–180)
TIBC: 342 ug/dL (ref 250–425)

## 2018-05-04 LAB — HEPATITIS B SURFACE ANTIBODY,QUALITATIVE: HEP B S AB: NONREACTIVE

## 2018-05-04 LAB — HEPATITIS B CORE ANTIBODY, TOTAL: Hep B Core Total Ab: NONREACTIVE

## 2018-05-04 LAB — HEPATITIS B SURFACE ANTIGEN: Hepatitis B Surface Ag: NONREACTIVE

## 2018-05-06 ENCOUNTER — Other Ambulatory Visit: Payer: Self-pay | Admitting: Family Medicine

## 2018-05-07 ENCOUNTER — Ambulatory Visit: Payer: Medicare HMO | Admitting: Family Medicine

## 2018-05-07 ENCOUNTER — Ambulatory Visit (INDEPENDENT_AMBULATORY_CARE_PROVIDER_SITE_OTHER): Payer: Medicare HMO | Admitting: Family Medicine

## 2018-05-07 ENCOUNTER — Encounter: Payer: Self-pay | Admitting: Family Medicine

## 2018-05-07 VITALS — BP 138/68 | HR 61 | Temp 97.7°F | Ht 75.0 in | Wt 254.4 lb

## 2018-05-07 DIAGNOSIS — R945 Abnormal results of liver function studies: Secondary | ICD-10-CM

## 2018-05-07 DIAGNOSIS — R7989 Other specified abnormal findings of blood chemistry: Secondary | ICD-10-CM

## 2018-05-07 DIAGNOSIS — I10 Essential (primary) hypertension: Secondary | ICD-10-CM

## 2018-05-07 DIAGNOSIS — E1165 Type 2 diabetes mellitus with hyperglycemia: Secondary | ICD-10-CM | POA: Diagnosis not present

## 2018-05-07 MED ORDER — METFORMIN HCL 1000 MG PO TABS: ORAL_TABLET | ORAL | 3 refills | Status: DC

## 2018-05-07 MED ORDER — GLIMEPIRIDE 4 MG PO TABS: 4.0000 mg | ORAL_TABLET | Freq: Every day | ORAL | 3 refills | Status: DC

## 2018-05-07 NOTE — Progress Notes (Signed)
Subjective:  Paul Paul is a 62 y.o. year old very pleasant male patient who presents for/with See problem oriented charting ROS- denies hypoglycemia- has had hyperglycemia. No chest pain or shortness of breath. no headache or blurry vision.    Past Medical History-  Patient Active Problem List   Diagnosis Date Noted  . Elevated LFTs 08/09/2017    Priority: High  . Cerebral aneurysm without rupture 01/01/2012    Priority: High  . Pacemaker 10/07/2011    Priority: High  . Poorly controlled type 2 diabetes mellitus (HCC) 11/09/2008    Priority: High  . 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 OSA on CPAP 09/24/2014    Priority: Medium  . Hyperlipidemia 09/24/2014    Priority: Medium  . Other specified cardiac dysrhythmias(427.89) 07/09/2012    Priority: Medium  . Essential hypertension 12/10/2007    Priority: Medium  . Obesity 01/01/2014    Priority: Low  . Chronic pruritic rash in adult 08/09/2017  . GAD (generalized anxiety disorder) 10/11/2016    Medications- reviewed and updated Current Outpatient Medications  Medication Sig Dispense Refill  . aspirin EC 81 MG tablet Take 81 mg by mouth daily.    . clotrimazole (LOTRIMIN) 1 % cream Apply 1 application topically 2 (two) times daily. On red areas of penis 30 g 0  . metFORMIN (GLUCOPHAGE) 1000 MG tablet TAKE 1 TABLET TWICE DAILY WITH MEALS (Patient taking differently: Take 1,000 mg by mouth two times a day with meals) 180 tablet 3  . metoprolol succinate (TOPROL-XL) 100 MG 24 hr tablet TAKE 1 TABLET EVERY MORNING 90 tablet 2   No current facility-administered medications for this visit.     Objective: BP 138/68 (BP Location: Left Arm, Patient Position: Sitting, Cuff Size: Large)   Pulse 61   Temp 97.7 F (36.5 C) (Oral)   Ht 6\' 3"  (1.905 m)   Wt 254 lb 6.4 oz (115.4 kg)   SpO2 95%   BMI 31.80 kg/m  Gen: NAD, resting comfortably CV: RRR no murmurs rubs or gallops Lungs: CTAB no  crackles, wheeze, rhonchi Abdomen: soft/nontender/nondistended/obese Ext: no edema Skin: warm, dry  Assessment/Plan:  Elevated LFTs S: Patient was seen 2 weeks ago. AST and ALT were over 100. Not immune to Hepatitis B. Ferritin levels were normal. He was down 8 lbs at last visit- had wanted to focus on this in case fatty liver. Hepatitis C nonreactive. Ordered US abdomen on 04/23/18-   He is no longer on statins Lab Results  Component Value Date   ALT 117 (H) 05/03/2018   AST 112 (H) 05/03/2018   ALKPHOS 79 05/03/2018   BILITOT 1.1 05/03/2018  A/P: we will await ultrasound- suspect fatty liver. Could possibly delve more into hemochromatosis workup (though doubt) with possibly elevated ferritin/tibc ratio. We discussed step wise approach per up to date- would move onto step 2 potentially next visit.   Poorly controlled type 2 diabetes mellitus (HCC) S: poorly controlled on metformin 1000mg  BID CBGs-  250 this AM Lab Results  Component Value Date   HGBA1C 12.1 02/04/2018   HGBA1C 12.3 (H) 08/09/2017   HGBA1C 9.4 10/11/2016   A/P: he and his wife state no way he can afford insulin or injectables or jardiance type medicines- unfortunately. We will add amaryl and he will work on weight loss- I told them I thought this was beyond a long shot for this to work- may end up needing to use insulin from  [facility]- 1 month recheck and update a1c with at least 1 month on the amaryl  Essential hypertension S: controlled on Metoprolol 100mg   XL though high normal systolic BP Readings from Last 3 Encounters:  05/07/18 138/68  04/23/18 134/80  02/14/18 (!) 142/75  A/P: We discussed blood pressure goal of <140/90. Continue current meds     Future Appointments  Date Time Provider Department Center  05/08/2018 11:30 AM [facility]  06/10/2018 11:15 AM Shelva MajesticHunter, Paul O, MD [facility]  07/31/2018  9:00 AM [facility]   1 month advised  Meds  ordered this encounter  Medications  . metFORMIN (GLUCOPHAGE) 1000 MG tablet    Sig: Take 1,000 mg by mouth two times a day with meals    Dispense:  180 tablet    Refill:  3  . glimepiride (AMARYL) 4 MG tablet    Sig: Take 1 tablet (4 mg total) by mouth daily before breakfast.    Dispense:  90 tablet    Refill:  3    Return precautions advised.  Tana ConchStephen Hunter, MD

## 2018-05-07 NOTE — Assessment & Plan Note (Signed)
S: controlled on Metoprolol 100mg   XL though high normal systolic BP Readings from Last 3 Encounters:  05/07/18 138/68  04/23/18 134/80  02/14/18 (!) 142/75  A/P: We discussed blood pressure goal of <140/90. Continue current meds

## 2018-05-07 NOTE — Assessment & Plan Note (Signed)
S: poorly controlled on metformin 1000mg  BID CBGs-  250 this AM Lab Results  Component Value Date   HGBA1C 12.1 02/04/2018   HGBA1C 12.3 (H) 08/09/2017   HGBA1C 9.4 10/11/2016   A/P: he and his wife state no way he can afford insulin or injectables or jardiance type medicines- unfortunately. We will add amaryl and he will work on weight loss- I told them I thought this was beyond a long shot for this to work- may end up needing to use insulin from [employer]- 1 month recheck and update a1c with at least 1 month on the [employer]

## 2018-05-07 NOTE — Patient Instructions (Addendum)
Call [facility]- need to get ultrasound. Suspect fatty liver.   You declined hep A and Hep B immunziations  Schedule a visit with me in 1 month so we can check on your sugars with metformin twice a day and glimepiride 4mg  one a day. Watch out for low blood sugars.   Cut out the cookies/candies/sweets/carbs/any sugar sweetened beverages!!!!  [employer] exercise 150 minutes a week

## 2018-05-07 NOTE — Assessment & Plan Note (Addendum)
S: Patient was seen 2 weeks ago. AST and ALT were over 100. Not immune to Hepatitis B. Ferritin levels were normal. He was down 8 lbs at last visit- had wanted to focus on this in case fatty liver. Hepatitis C nonreactive. Ordered us abdomen on 04/23/18-   He is no longer on statins Lab Results  Component Value Date   ALT 117 (H) 05/03/2018   AST 112 (H) 05/03/2018   ALKPHOS 79 05/03/2018   BILITOT 1.1 05/03/2018  A/P: we will await ultrasound- suspect fatty liver. Could possibly delve more into hemochromatosis workup (though doubt) with possibly elevated ferritin/tibc ratio. We discussed step wise approach per up to date- would move onto step 2 potentially next visit.   - he declined hep A and hep B immunizations

## 2018-05-08 ENCOUNTER — Other Ambulatory Visit: Payer: Medicare HMO

## 2018-05-15 ENCOUNTER — Other Ambulatory Visit: Payer: Medicare HMO

## 2018-05-20 ENCOUNTER — Ambulatory Visit
Admission: RE | Admit: 2018-05-20 | Discharge: 2018-05-20 | Disposition: A | Discharge status: Home / Self Care | Payer: Medicare HMO | Source: Ambulatory Visit | Attending: Family Medicine | Admitting: Family Medicine

## 2018-05-20 DIAGNOSIS — R945 Abnormal results of liver function studies: Secondary | ICD-10-CM | POA: Diagnosis not present

## 2018-05-20 DIAGNOSIS — K760 Fatty (change of) liver, not elsewhere classified: Secondary | ICD-10-CM | POA: Diagnosis not present

## 2018-05-20 DIAGNOSIS — R7989 Other specified abnormal findings of blood chemistry: Secondary | ICD-10-CM

## 2018-05-24 ENCOUNTER — Encounter: Payer: Self-pay | Admitting: Gastroenterology

## 2018-06-10 ENCOUNTER — Ambulatory Visit: Payer: Medicare HMO | Admitting: Family Medicine

## 2018-06-17 NOTE — Progress Notes (Signed)
Subjective:  Paul Paul is a 62 y.o. year old very pleasant male patient who presents for/with See problem oriented charting ROS- No chest pain or shortness of breath. No headache or blurry vision.  No hypoglycemia   Past Medical History-  Patient Active Problem List   Diagnosis Date Noted  . Elevated LFTs 08/09/2017    Priority: High  . Cerebral aneurysm without rupture 01/01/2012    Priority: High  . Pacemaker 10/07/2011    Priority: High  . Poorly controlled type 2 diabetes mellitus ([place]) 11/09/2008    Priority: High  . 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 OSA on CPAP 09/24/2014    Priority: Medium  . Hyperlipidemia 09/24/2014    Priority: Medium  . Other specified cardiac dysrhythmias(427.89) 07/09/2012    Priority: Medium  . Essential hypertension 12/10/2007    Priority: Medium  . Obesity 01/01/2014    Priority: Low  . Fatty liver 06/18/2018  . Chronic pruritic rash in adult 08/09/2017  . GAD (generalized anxiety disorder) 10/11/2016    Medications- reviewed and updated Current Outpatient Medications  Medication Sig Dispense Refill  . aspirin EC 81 MG tablet Take 81 mg by mouth daily.    Marland Kitchen glimepiride (AMARYL) 4 MG tablet Take 1 tablet (4 mg total) by mouth daily before breakfast. 90 tablet 3  . metoprolol succinate (TOPROL-XL) 100 MG 24 hr tablet TAKE 1 TABLET EVERY MORNING 90 tablet 2   No current facility-administered medications for this visit.     Objective: BP 126/82 (BP Location: Left Arm, Patient Position: Sitting, Cuff Size: Normal)   Pulse 60   Temp 98.4 F (36.9 C) (Oral)   Ht '6\' 3"'  (1.905 m)   Wt 256 lb (116.1 kg)   SpO2 94%   BMI 32.00 kg/m  Gen: NAD, resting comfortably CV: RRR no murmurs rubs or gallops Lungs: CTAB no crackles, wheeze, rhonchi Abdomen: soft/nontender/nondistended/normal bowel sounds.  Ext: no edema Skin: warm, dry  Assessment/Plan:  Poorly controlled type 2 diabetes mellitus ([place]) S:  poorly controlled on last check. Patient states cannot afford insulin or newer medicines. We opted to max out metformin at 1058m BID (on at last visit) and add amaryl at 4 mg. He has improved a1c before with diet/exercise. We had discussed briefly relion insulin.   Unfortunately he has stopped the metformin due to worry that sugars would get too low. Fortunately CBGs- runnign 250 last AM of visit. Has been running 90s to 120s in the morning Exercise and diet- exercising 5 days a week. Eating more veggies, chicken, little bread Lab Results  Component Value Date   HGBA1C 12.1 02/04/2018   HGBA1C 12.3 (H) 08/09/2017   HGBA1C 9.4 10/11/2016   A/P: will get a1c- hopeful drastic improvement but if not will add metformin back in and check in 14 weeks  Addendum: a1c under 10 but still not at goal- add metformin 1 g BID and recheck 14 weeks  Essential hypertension S: controlled on  metoprolol 1038mXL BP Readings from Last 3 Encounters:  06/18/18 126/82  05/07/18 138/68  04/23/18 134/80  A/P: We discussed blood pressure goal of <140/90. Continue current meds  Fatty liver S: LFTs have been elevated. Not immune to hep Bbut HCV nonreactive. Ferritin was normal though did have possibly elevated ferritin/tibc ratio. Down 8 lbs at last visit- was to focus on continued weight loss with potential fatty liver.   Liver ultrasound showed likely fatty liver. We have discussed  importance of weight loss.  Lab Results  Component Value Date   ALT 117 (H) 05/03/2018   AST 112 (H) 05/03/2018   ALKPHOS 79 05/03/2018   BILITOT 1.1 05/03/2018  A/P: update LFTs today. Update ferritin/tibc again. Depending on findings may move onto step 2 of up to date algorithm- though this is most likely fatty liver- if #s stable will focus on weight loss. Not drinking any alcohol  Addendum- improvement on labs- suspect strongly fatty liver with improved diet Lab Results  Component Value Date   ALT 68 (H) 06/18/2018   AST 80  (H) 06/18/2018   ALKPHOS 56 06/18/2018   BILITOT 0.9 06/18/2018    Future Appointments  Date Time Provider [place]  07/31/2018  9:00 AM [facility]  09/18/2018  9:00 AM Marin Olp, MD [facility]   Return in about 3 months (around 09/24/2018).  Lab/Order associations: Elevated LFTs - Plan: Iron, TIBC and Ferritin Panel, Comp Met (CMET)  Poorly controlled type 2 diabetes mellitus ([place]) - Plan: Hemoglobin A1c  Essential hypertension  Fatty liver  Return precautions advised.  Garret Reddish, MD

## 2018-06-18 ENCOUNTER — Ambulatory Visit (INDEPENDENT_AMBULATORY_CARE_PROVIDER_SITE_OTHER): Payer: Medicare HMO | Admitting: Family Medicine

## 2018-06-18 ENCOUNTER — Encounter: Payer: Self-pay | Admitting: Family Medicine

## 2018-06-18 VITALS — BP 126/82 | HR 60 | Temp 98.4°F | Ht 75.0 in | Wt 256.0 lb

## 2018-06-18 DIAGNOSIS — K760 Fatty (change of) liver, not elsewhere classified: Secondary | ICD-10-CM

## 2018-06-18 DIAGNOSIS — R945 Abnormal results of liver function studies: Secondary | ICD-10-CM | POA: Diagnosis not present

## 2018-06-18 DIAGNOSIS — I10 Essential (primary) hypertension: Secondary | ICD-10-CM

## 2018-06-18 DIAGNOSIS — R740 Nonspecific elevation of levels of transaminase and lactic acid dehydrogenase [LDH]: Secondary | ICD-10-CM | POA: Diagnosis not present

## 2018-06-18 DIAGNOSIS — E1165 Type 2 diabetes mellitus with hyperglycemia: Secondary | ICD-10-CM

## 2018-06-18 DIAGNOSIS — R7989 Other specified abnormal findings of blood chemistry: Secondary | ICD-10-CM

## 2018-06-18 LAB — COMPREHENSIVE METABOLIC PANEL
ALT: 68 U/L — ABNORMAL HIGH (ref 0–53)
AST: 80 U/L — AB (ref 0–37)
Albumin: 3.6 g/dL (ref 3.5–5.2)
Alkaline Phosphatase: 56 U/L (ref 39–117)
BUN: 8 mg/dL (ref 6–23)
CALCIUM: 9.5 mg/dL (ref 8.4–10.5)
CHLORIDE: 103 meq/L (ref 96–112)
CO2: 25 meq/L (ref 19–32)
CREATININE: 0.83 mg/dL (ref 0.40–1.50)
GFR: 99.39 mL/min (ref >60.00)
GLUCOSE: 231 mg/dL — AB (ref 70–99)
Potassium: 4 mEq/L (ref 3.5–5.1)
SODIUM: 136 meq/L (ref 135–145)
Total Bilirubin: 0.9 mg/dL (ref 0.2–1.2)
Total Protein: 7.5 g/dL (ref 6.0–8.3)

## 2018-06-18 LAB — HEMOGLOBIN A1C: Hgb A1c MFr Bld: 9.8 % — ABNORMAL HIGH (ref 4.6–6.5)

## 2018-06-18 NOTE — Assessment & Plan Note (Signed)
S: poorly controlled on last check. Patient states cannot afford insulin or newer medicines. We opted to max out metformin at 1000mg  BID (on at last visit) and add amaryl at 4 mg. He has improved a1c before with diet/exercise. We had discussed briefly relion insulin.   Unfortunately he has stopped the metformin due to worry that sugars would get too low. Fortunately CBGs- runnign 250 last AM of visit. Has been running 90s to 120s in the morning Exercise and diet- exercising 5 days a week. Eating more veggies, chicken, little bread Lab Results  Component Value Date   HGBA1C 12.1 02/04/2018   HGBA1C 12.3 (H) 08/09/2017   HGBA1C 9.4 10/11/2016   A/P: will get a1c- hopeful drastic improvement but if not will add metformin back in and check in 14 weeks  Addendum: a1c under 10 but still not at goal- add metformin 1 g BID and recheck 14 weeks

## 2018-06-18 NOTE — Patient Instructions (Addendum)
Health Maintenance Due  Topic Date Due  . COLONOSCOPY - Patient will reschedule it in August. Please go ahead and call them to reschedule.  02/24/2017  . OPHTHALMOLOGY EXAM - Patient will schedule an appointment 05/22/2017  . I would advise you to get Hepatitis B immunizations at your pharmacy given elevated liver tests and known fatty liver    Continue to focus on healthy eating, regular exercise. Want to see you in 14 weeks and want you to trim 5-10 lbs off in that time frame.   If a1c under 10 but over 8- likely add metformin back in. If above 10 may need metformin and insulin- we are going to hope for good #s since your morning sugars are doing so much better  I would also like for you to sign up for an annual wellness visit with one of our nurses, Cassie or Darl PikesSusan, who both specialize in the annual wellness visit. This is a free benefit under medicare that may help us find additional ways to help you. Some highlights are reviewing medications, lifestyle, and doing a dementia screen.   Please stop by the lab before you go.

## 2018-06-18 NOTE — Assessment & Plan Note (Signed)
S: LFTs have been elevated. Not immune to hep Bbut HCV nonreactive. Ferritin was normal though did have possibly elevated ferritin/tibc ratio. Down 8 lbs at last visit- was to focus on continued weight loss with potential fatty liver.   Liver ultrasound showed likely fatty liver. We have discussed importance of weight loss.  Lab Results  Component Value Date   ALT 117 (H) 05/03/2018   AST 112 (H) 05/03/2018   ALKPHOS 79 05/03/2018   BILITOT 1.1 05/03/2018  A/P: update LFTs today. Update ferritin/tibc again. Depending on findings may move onto step 2 of up to date algorithm- though this is most likely fatty liver- if #s stable will focus on weight loss. Not drinking any alcohol  Addendum- improvement on labs- suspect strongly fatty liver with improved diet Lab Results  Component Value Date   ALT 68 (H) 06/18/2018   AST 80 (H) 06/18/2018   ALKPHOS 56 06/18/2018   BILITOT 0.9 06/18/2018

## 2018-06-18 NOTE — Assessment & Plan Note (Signed)
S: controlled on  metoprolol 100mg  XL BP Readings from Last 3 Encounters:  06/18/18 126/82  05/07/18 138/68  04/23/18 134/80  A/P: We discussed blood pressure goal of <140/90. Continue current meds

## 2018-06-19 ENCOUNTER — Other Ambulatory Visit: Payer: Self-pay

## 2018-06-19 LAB — IRON,TIBC AND FERRITIN PANEL
%SAT: 43 % (ref 20–48)
Ferritin: 215 ng/mL (ref 24–380)
IRON: 154 ug/dL (ref 50–180)
TIBC: 361 ug/dL (ref 250–425)

## 2018-06-19 MED ORDER — METFORMIN HCL 1000 MG PO TABS: 1000.0000 mg | ORAL_TABLET | Freq: Two times a day (BID) | ORAL | 3 refills | Status: DC

## 2018-07-30 ENCOUNTER — Encounter: Payer: Medicare HMO | Admitting: Gastroenterology

## 2018-09-18 ENCOUNTER — Encounter: Payer: Self-pay | Admitting: Family Medicine

## 2018-09-18 ENCOUNTER — Ambulatory Visit (INDEPENDENT_AMBULATORY_CARE_PROVIDER_SITE_OTHER): Payer: Medicare HMO | Admitting: Family Medicine

## 2018-09-18 VITALS — BP 134/80 | HR 70 | Temp 97.9°F | Ht 75.0 in | Wt 274.8 lb

## 2018-09-18 DIAGNOSIS — K760 Fatty (change of) liver, not elsewhere classified: Secondary | ICD-10-CM | POA: Diagnosis not present

## 2018-09-18 DIAGNOSIS — I10 Essential (primary) hypertension: Secondary | ICD-10-CM | POA: Diagnosis not present

## 2018-09-18 DIAGNOSIS — E1165 Type 2 diabetes mellitus with hyperglycemia: Secondary | ICD-10-CM

## 2018-09-18 DIAGNOSIS — R945 Abnormal results of liver function studies: Secondary | ICD-10-CM

## 2018-09-18 DIAGNOSIS — Z23 Encounter for immunization: Secondary | ICD-10-CM

## 2018-09-18 DIAGNOSIS — R7989 Other specified abnormal findings of blood chemistry: Secondary | ICD-10-CM

## 2018-09-18 LAB — MICROALBUMIN / CREATININE URINE RATIO
CREATININE, U: 148.6 mg/dL
Microalb Creat Ratio: 0.5 mg/g (ref 0.0–30.0)
Microalb, Ur: <0.7 mg/dL (ref 0.0–1.9)

## 2018-09-18 NOTE — Assessment & Plan Note (Signed)
S: Poorly controlled on last check but had significantly improved.  We added metformin 1 g twice a day. He continued glimepiride 4 mg cbg- he has noted morning sugars mainly in 120s. No low blood sugars Exercise and diet- unfortunately his weight has bounced back up 18 pounds. He states he stopped walking 5 days a week- wants to restart. Plans to go to gym with grandson at least 3 days a week Lab Results  Component Value Date   HGBA1C 9.8 (H) 06/18/2018   HGBA1C 12.1 02/04/2018   HGBA1C 12.3 (H) 08/09/2017   A/P:  We will have him back later this week for a1c  From avs "I like your idea of getting into gym with grandson. I like your plan to get your diet back to where it was- hoping we can get a1c down without adding more medicine. If your #s are above 8.5, lets see what Alma Friendly would cost- this can cause modest improvements in a1c and generally doesn't promote weight gain. If expensive we could use glimepiride instead at higher dose. "

## 2018-09-18 NOTE — Addendum Note (Signed)
Addended by: Felix Ahmadi A on: 09/18/2018 09:29 AM   Modules accepted: Orders

## 2018-09-18 NOTE — Patient Instructions (Addendum)
Health Maintenance Due  Topic Date Due  . COLONOSCOPY -please call and reschedule- [facility] [phone] 02/24/2017  . OPHTHALMOLOGY EXAM -please call and schedule 05/22/2017  . URINE MICROALBUMIN -ordered today 08/09/2018   Please stop by lab before you go JUST FOR URINE  Schedule lab visit within a week for blood draw   I like your idea of getting into gym with grandson. I like your plan to get your diet back to where it was- hoping we can get a1c down without adding more medicine. If your #s are above 8.5, lets see what Alma Friendly would cost- this can cause modest improvements in a1c and generally doesn't promote weight gain. If expensive we could use glimepiride instead at higher dose.

## 2018-09-18 NOTE — Assessment & Plan Note (Signed)
See "fatty liver"

## 2018-09-18 NOTE — Progress Notes (Signed)
Subjective:  Paul Paul is a 62 y.o. year old very pleasant male patient who presents for/with See problem oriented charting ROS- No chest pain or shortness of breath. No headache or blurry vision.    Past Medical History-  Patient Active Problem List   Diagnosis Date Noted  . Cerebral aneurysm without rupture 01/01/2012    Priority: High  . Pacemaker 10/07/2011    Priority: High  . Poorly controlled type 2 diabetes mellitus (HCC) 11/09/2008    Priority: High  . Fatty liver 06/18/2018    Priority: Medium  . 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 Elevated LFTs 08/09/2017    Priority: Medium  . OSA on CPAP 09/24/2014    Priority: Medium  . Hyperlipidemia 09/24/2014    Priority: Medium  . Other specified cardiac dysrhythmias(427.89) 07/09/2012    Priority: Medium  . Essential hypertension 12/10/2007    Priority: Medium  . Obesity 01/01/2014    Priority: Low  . Chronic pruritic rash in adult 08/09/2017  . GAD (generalized anxiety disorder) 10/11/2016    Medications- reviewed and updated Current Outpatient Medications  Medication Sig Dispense Refill  . aspirin EC 81 MG tablet Take 81 mg by mouth daily.    Marland Kitchen glimepiride (AMARYL) 4 MG tablet Take 1 tablet (4 mg total) by mouth daily before breakfast. 90 tablet 3  . metFORMIN (GLUCOPHAGE) 1000 MG tablet Take 1 tablet (1,000 mg total) by mouth 2 (two) times daily with a meal. 180 tablet 3  . metoprolol succinate (TOPROL-XL) 100 MG 24 hr tablet TAKE 1 TABLET EVERY MORNING 90 tablet 2   No current facility-administered medications for this visit.     Objective: BP 134/80 (BP Location: Left Arm, Patient Position: Sitting, Cuff Size: Large)   Pulse 70   Temp 97.9 F (36.6 C) (Oral)   Ht 6\' 3"  (1.905 m)   Wt 274 lb 12.8 oz (124.6 kg)   SpO2 94%   BMI 34.35 kg/m  Gen: NAD, resting comfortably CV: RRR no murmurs rubs or gallops Lungs: CTAB no crackles, wheeze, rhonchi Abdomen:  nondistended/normal bowel sounds Ext: no edema Skin: warm, dry Neuro: speech normal, moves all extremities  Assessment/Plan:   Hypertension  s: controlled on metoprolol 100 mg extended release BP Readings from Last 3 Encounters:  09/18/18 134/80  06/18/18 126/82  05/07/18 138/68  A/P: We discussed blood pressure goal of <140/90. Continue current meds   Elevated LFTs See fatty liver  Fatty liver S: Fortunately patient's AST and ALT trended back down under 100 last check.  Liver ultrasound has showed fatty liver.  We have discussed importance of weight loss-unfortunately has gained weight.  Have encouraged him to get hepatitis B vaccination at pharmacy due to being on Medicare-he has not done this yet Lab Results  Component Value Date   ALT 68 (H) 06/18/2018   AST 80 (H) 06/18/2018   ALKPHOS 56 06/18/2018   BILITOT 0.9 06/18/2018  A/P: get repeat LFTs- suspect worsening with recent weight gain- encouraged reversal of course   Poorly controlled type 2 diabetes mellitus (HCC) S: Poorly controlled on last check but had significantly improved.  We added metformin 1 g twice a day. He continued glimepiride 4 mg cbg- he has noted morning sugars mainly in 120s. No low blood sugars Exercise and diet- unfortunately his weight has bounced back up 18 pounds. He states he stopped walking 5 days a week- wants to restart. Plans to go to gym with  grandson at least 3 days a week Lab Results  Component Value Date   HGBA1C 9.8 (H) 06/18/2018   HGBA1C 12.1 02/04/2018   HGBA1C 12.3 (H) 08/09/2017   A/P:  We will have him back later this week for a1c  From avs "I like your idea of getting into gym with grandson. I like your plan to get your diet back to where it was- hoping we can get a1c down without adding more medicine. If your #s are above 8.5, lets see what Alma Friendly would cost- this can cause modest improvements in a1c and generally doesn't promote weight gain. If expensive we could use  glimepiride instead at higher dose. "   Future Appointments  Date Time Provider Department Center  10/21/2018  9:00 AM [facility]   Return in about 4 months (around 01/19/2019) for follow up- or sooner if needed.  Lab/Order associations: Poorly controlled type 2 diabetes mellitus (HCC) - Plan: Microalbumin / creatinine urine ratio, Hemoglobin A1c, Comprehensive metabolic panel  Need for prophylactic vaccination and inoculation against influenza - Plan: Flu Vaccine QUAD 36+ mos IM  Elevated LFTs  Fatty liver  Essential hypertension  Return precautions advised.  Tana Conch, MD

## 2018-09-18 NOTE — Assessment & Plan Note (Signed)
S: Fortunately patient's AST and ALT trended back down under 100 last check.  Liver ultrasound has showed fatty liver.  We have discussed importance of weight loss-unfortunately has gained weight.  Have encouraged him to get hepatitis B vaccination at pharmacy due to being on Medicare-he has not done this yet Lab Results  Component Value Date   ALT 68 (H) 06/18/2018   AST 80 (H) 06/18/2018   ALKPHOS 56 06/18/2018   BILITOT 0.9 06/18/2018  A/P: get repeat LFTs- suspect worsening with recent weight gain- encouraged reversal of course

## 2018-09-20 ENCOUNTER — Other Ambulatory Visit (INDEPENDENT_AMBULATORY_CARE_PROVIDER_SITE_OTHER): Payer: Medicare HMO

## 2018-09-20 DIAGNOSIS — E1165 Type 2 diabetes mellitus with hyperglycemia: Secondary | ICD-10-CM | POA: Diagnosis not present

## 2018-09-20 LAB — COMPREHENSIVE METABOLIC PANEL
ALK PHOS: 54 U/L (ref 39–117)
ALT: 48 U/L (ref 0–53)
AST: 52 U/L — ABNORMAL HIGH (ref 0–37)
Albumin: 3.5 g/dL (ref 3.5–5.2)
BUN: 9 mg/dL (ref 6–23)
CO2: 25 mEq/L (ref 19–32)
Calcium: 9.4 mg/dL (ref 8.4–10.5)
Chloride: 102 mEq/L (ref 96–112)
Creatinine, Ser: 0.88 mg/dL (ref 0.40–1.50)
GFR: 92.83 mL/min (ref >60.00)
Glucose, Bld: 220 mg/dL — ABNORMAL HIGH (ref 70–99)
POTASSIUM: 4.1 meq/L (ref 3.5–5.1)
Sodium: 135 mEq/L (ref 135–145)
TOTAL PROTEIN: 8 g/dL (ref 6.0–8.3)
Total Bilirubin: 1.1 mg/dL (ref 0.2–1.2)

## 2018-09-20 LAB — HEMOGLOBIN A1C: Hgb A1c MFr Bld: 9.4 % — ABNORMAL HIGH (ref 4.6–6.5)

## 2018-09-24 ENCOUNTER — Other Ambulatory Visit: Payer: Self-pay

## 2018-09-24 MED ORDER — SITAGLIPTIN PHOSPHATE 100 MG PO TABS: 100.0000 mg | ORAL_TABLET | Freq: Every day | ORAL | 5 refills | Status: DC

## 2018-10-21 ENCOUNTER — Ambulatory Visit (INDEPENDENT_AMBULATORY_CARE_PROVIDER_SITE_OTHER): Payer: Medicare HMO | Admitting: *Deleted

## 2018-10-21 DIAGNOSIS — R001 Bradycardia, unspecified: Secondary | ICD-10-CM

## 2018-10-21 LAB — CUP PACEART INCLINIC DEVICE CHECK
Implantable Lead Location: 753859
Implantable Lead Location: 753860
Implantable Pulse Generator Implant Date: 20120801
Lead Channel Pacing Threshold Amplitude: 0.75 V
Lead Channel Pacing Threshold Amplitude: 1.25 V
Lead Channel Pacing Threshold Amplitude: 1.25 V
Lead Channel Pacing Threshold Pulse Width: 0.4 ms
Lead Channel Pacing Threshold Pulse Width: 0.4 ms
Lead Channel Pacing Threshold Pulse Width: 0.4 ms
Lead Channel Pacing Threshold Pulse Width: 0.4 ms
Lead Channel Sensing Intrinsic Amplitude: 5 mV
Lead Channel Setting Pacing Amplitude: 1.375
Lead Channel Setting Pacing Pulse Width: 0.4 ms
MDC IDC LEAD IMPLANT DT: 20120801
MDC IDC LEAD IMPLANT DT: 20120801
MDC IDC MSMT BATTERY REMAINING LONGEVITY: 86 mo
MDC IDC MSMT BATTERY VOLTAGE: 2.9 V
MDC IDC MSMT LEADCHNL RA IMPEDANCE VALUE: 512.5 Ohm
MDC IDC MSMT LEADCHNL RA PACING THRESHOLD AMPLITUDE: 0.75 V
MDC IDC MSMT LEADCHNL RV IMPEDANCE VALUE: 425 Ohm
MDC IDC MSMT LEADCHNL RV SENSING INTR AMPL: 9.1 mV
MDC IDC PG SERIAL: 7250770
MDC IDC SESS DTM: 20191118093630
MDC IDC SET LEADCHNL RA PACING AMPLITUDE: 2 V
MDC IDC SET LEADCHNL RV SENSING SENSITIVITY: 2 mV
MDC IDC STAT BRADY RA PERCENT PACED: 64 %
MDC IDC STAT BRADY RV PERCENT PACED: 0.04 %

## 2018-10-21 NOTE — Progress Notes (Signed)
Pacemaker check in clinic. Normal device function. Thresholds, sensing, impedances consistent with previous measurements. Device programmed to maximize longevity. No mode switch or high ventricular rates noted. Device programmed at appropriate safety margins. Histogram distribution appropriate for patient activity level. Device programmed to optimize intrinsic conduction. Estimated longevity  6.7 years. ROV w/ GT 01/2019

## 2018-12-09 ENCOUNTER — Encounter: Payer: Self-pay | Admitting: Family Medicine

## 2018-12-09 ENCOUNTER — Ambulatory Visit (INDEPENDENT_AMBULATORY_CARE_PROVIDER_SITE_OTHER): Payer: Medicare HMO | Admitting: Family Medicine

## 2018-12-09 VITALS — BP 136/68 | HR 95 | Temp 98.1°F | Ht 75.0 in | Wt 278.8 lb

## 2018-12-09 DIAGNOSIS — E1165 Type 2 diabetes mellitus with hyperglycemia: Secondary | ICD-10-CM | POA: Diagnosis not present

## 2018-12-09 DIAGNOSIS — J329 Chronic sinusitis, unspecified: Secondary | ICD-10-CM

## 2018-12-09 DIAGNOSIS — B9689 Other specified bacterial agents as the cause of diseases classified elsewhere: Secondary | ICD-10-CM

## 2018-12-09 MED ORDER — METFORMIN HCL 1000 MG PO TABS: 1000.0000 mg | ORAL_TABLET | Freq: Two times a day (BID) | ORAL | 3 refills | Status: DC

## 2018-12-09 MED ORDER — AMOXICILLIN-POT CLAVULANATE 875-125 MG PO TABS: 1.0000 | ORAL_TABLET | Freq: Two times a day (BID) | ORAL | 0 refills | Status: DC

## 2018-12-09 MED ORDER — AMOXICILLIN-POT CLAVULANATE 875-125 MG PO TABS: 1.0000 | ORAL_TABLET | Freq: Two times a day (BID) | ORAL | 0 refills | Status: AC

## 2018-12-09 NOTE — Patient Instructions (Signed)
Sinus infection/Sinsusitis Bacterial based on: Symptoms >10 days, double sickening  Treatment: -considered steroid: we opted out for now due to poor control of diabetes -other symptomatic care with mucinex, tylenol  -Antibiotic indicated: yes  Finally, we reviewed reasons to return to care including if symptoms worsen or persist beyond antibiotics or new concerns arise (particularly fever or shortness of breath)  Meds ordered this encounter  Medications  . metFORMIN (GLUCOPHAGE) 1000 MG tablet    Sig: Take 1 tablet (1,000 mg total) by mouth 2 (two) times daily with a meal.    Dispense:  180 tablet    Refill:  3  . amoxicillin-clavulanate (AUGMENTIN) 875-125 MG tablet    Sig: Take 1 tablet by mouth 2 (two) times daily for 7 days.    Dispense:  14 tablet    Refill:  0   Please restart your metformin and continue the glimepiride- you need both because your a1c is far too high. Please make sure to see me within 3 months so we can recheck a1c.

## 2018-12-09 NOTE — Progress Notes (Signed)
PCP: Shelva MajesticHunter, Paul Fuerte O, MD  Subjective:  Paul Paul is a 63 y.Paul. year old very pleasant male patient who presents with sinusitis symptoms including nasal congestion, sinus tenderness -other symptoms include: Deep cough Phlegm- green/yellow mix Sinus pressure and drainage Sore from coughing so much, coughing fits that make it hard to breath -day of illness:10 days of symptoms.  -Symptoms-Seemed much better for 2 days then worsened again -previous treatments: Tylenol and Mucinex -sick contacts/travel/risks: denies flu exposure.   ROS-denies fever, SOB-other than with coughing fits-does feel fatigued with activity though, NVD, tooth pain  Pertinent Past Medical History-  Patient Active Problem List   Diagnosis Date Noted  . Cerebral aneurysm without rupture 01/01/2012    Priority: High  . Pacemaker 10/07/2011    Priority: High  . Poorly controlled type 2 diabetes mellitus (HCC) 11/09/2008    Priority: High  . Fatty liver 06/18/2018    Priority: Medium  . 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 Elevated LFTs 08/09/2017    Priority: Medium  . OSA on CPAP 09/24/2014    Priority: Medium  . Hyperlipidemia 09/24/2014    Priority: Medium  . Other specified cardiac dysrhythmias(427.89) 07/09/2012    Priority: Medium  . Essential hypertension 12/10/2007    Priority: Medium  . Obesity 01/01/2014    Priority: Low  . Chronic pruritic rash in adult 08/09/2017  . GAD (generalized anxiety disorder) 10/11/2016    Medications- reviewed  Current Outpatient Medications  Medication Sig Dispense Refill  . aspirin EC 81 MG tablet Take 81 mg by mouth daily.    Marland Kitchen. glimepiride (AMARYL) 4 MG tablet Take 1 tablet (4 mg total) by mouth daily before breakfast. 90 tablet 3  . metoprolol succinate (TOPROL-XL) 100 MG 24 hr tablet TAKE 1 TABLET EVERY MORNING 90 tablet 2   No current facility-administered medications for this visit.     Objective: BP 136/68 (BP  Location: Left Arm, Patient Position: Sitting, Cuff Size: Large)   Pulse 95   Temp 98.1 F (36.7 C) (Oral)   Ht 6\' 3"  (1.905 m)   Wt 278 lb 12.8 oz (126.5 kg)   SpO2 97%   BMI 34.85 kg/m  Gen: NAD, resting comfortably HEENT: Turbinates erythematous with yellow drainage, TM normal, pharynx mildly erythematous with no tonsilar exudate or edema CV: RRR no murmurs rubs or gallops Lungs: CTAB no crackles, wheeze, rhonchi Abdomen: soft/nontender/nondistended/ Ext: no edema Skin: warm, dry, no rash Neuro: grossly normal, moves all extremities  Assessment/Plan:  Sinus infection/Sinsusitis Bacterial based on: Symptoms >10 days, double sickening  Treatment: -considered steroid: we opted out for now due to poor control of diabetes -other symptomatic care with mucinex, tylenol  -Antibiotic indicated: yes   Poorly controlled type 2 diabetes mellitus (HCC) S: Unfortunately patient was not clear on instructions when we added the glimepiride.  He stopped taking his metformin.  He does state most of his blood sugars are under 200 at least A/P: Suspect worsening controlled diabetes-encouraged him to restart his metformin alongside his glimepiride.  Advised 8157-month follow-up   Finally, we reviewed reasons to return to care including if symptoms worsen or persist beyond antibiotics or new concerns arise (particularly fever or shortness of breath)  Meds ordered this encounter  Medications  . metFORMIN (GLUCOPHAGE) 1000 MG tablet    Sig: Take 1 tablet (1,000 mg total) by mouth 2 (two) times daily with a meal.    Dispense:  180 tablet  Refill:  3  . amoxicillin-clavulanate (AUGMENTIN) 875-125 MG tablet    Sig: Take 1 tablet by mouth 2 (two) times daily for 7 days.    Dispense:  14 tablet    Refill:  0    Tana ConchStephen Corazon Nickolas, MD

## 2018-12-09 NOTE — Assessment & Plan Note (Signed)
S: Unfortunately patient was not clear on instructions when we added the glimepiride.  He stopped taking his metformin.  He does state most of his blood sugars are under 200 at least A/P: Suspect worsening controlled diabetes-encouraged him to restart his metformin alongside his glimepiride.  Advised 69-month follow-up

## 2019-01-02 ENCOUNTER — Emergency Department (HOSPITAL_COMMUNITY): Payer: Medicare HMO

## 2019-01-02 ENCOUNTER — Emergency Department (HOSPITAL_COMMUNITY)
Admission: EM | Admit: 2019-01-02 | Discharge: 2019-01-02 | Disposition: A | Discharge status: Home / Self Care | Payer: Medicare HMO | Attending: Emergency Medicine | Admitting: Emergency Medicine

## 2019-01-02 ENCOUNTER — Encounter (HOSPITAL_COMMUNITY): Payer: Self-pay | Admitting: Emergency Medicine

## 2019-01-02 ENCOUNTER — Other Ambulatory Visit: Payer: Self-pay

## 2019-01-02 DIAGNOSIS — Z7984 Long term (current) use of oral hypoglycemic drugs: Secondary | ICD-10-CM | POA: Diagnosis not present

## 2019-01-02 DIAGNOSIS — Z87891 Personal history of nicotine dependence: Secondary | ICD-10-CM | POA: Insufficient documentation

## 2019-01-02 DIAGNOSIS — E1165 Type 2 diabetes mellitus with hyperglycemia: Secondary | ICD-10-CM | POA: Diagnosis not present

## 2019-01-02 DIAGNOSIS — R739 Hyperglycemia, unspecified: Secondary | ICD-10-CM

## 2019-01-02 DIAGNOSIS — I10 Essential (primary) hypertension: Secondary | ICD-10-CM | POA: Diagnosis not present

## 2019-01-02 DIAGNOSIS — Z7982 Long term (current) use of aspirin: Secondary | ICD-10-CM | POA: Diagnosis not present

## 2019-01-02 DIAGNOSIS — R072 Precordial pain: Secondary | ICD-10-CM | POA: Insufficient documentation

## 2019-01-02 DIAGNOSIS — R079 Chest pain, unspecified: Secondary | ICD-10-CM | POA: Diagnosis not present

## 2019-01-02 DIAGNOSIS — Z79899 Other long term (current) drug therapy: Secondary | ICD-10-CM | POA: Insufficient documentation

## 2019-01-02 LAB — BASIC METABOLIC PANEL
Anion gap: 10 (ref 5–15)
BUN: 5 mg/dL — ABNORMAL LOW (ref 8–23)
CHLORIDE: 103 mmol/L (ref 98–111)
CO2: 23 mmol/L (ref 22–32)
Calcium: 8.9 mg/dL (ref 8.9–10.3)
Creatinine, Ser: 0.8 mg/dL (ref 0.61–1.24)
GFR calc Af Amer: >60 mL/min (ref >60)
GFR calc non Af Amer: >60 mL/min (ref >60)
Glucose, Bld: 333 mg/dL — ABNORMAL HIGH (ref 70–99)
Potassium: 3.9 mmol/L (ref 3.5–5.1)
Sodium: 136 mmol/L (ref 135–145)

## 2019-01-02 LAB — CBC
HEMATOCRIT: 48 % (ref 39.0–52.0)
Hemoglobin: 16.3 g/dL (ref 13.0–17.0)
MCH: 31 pg (ref 26.0–34.0)
MCHC: 34 g/dL (ref 30.0–36.0)
MCV: 91.3 fL (ref 80.0–100.0)
Platelets: 179 10*3/uL (ref 150–400)
RBC: 5.26 MIL/uL (ref 4.22–5.81)
RDW: 12.7 % (ref 11.5–15.5)
WBC: 6 10*3/uL (ref 4.0–10.5)
nRBC: 0 % (ref 0.0–0.2)

## 2019-01-02 LAB — I-STAT TROPONIN, ED
Troponin i, poc: 0.01 ng/mL (ref 0.00–0.08)
Troponin i, poc: 0.01 ng/mL (ref 0.00–0.08)

## 2019-01-02 MED ORDER — SODIUM CHLORIDE 0.9% FLUSH: 3.0000 mL | Freq: Once | INTRAVENOUS | Status: DC

## 2019-01-02 NOTE — ED Notes (Signed)
Patient denies pain and is resting comfortably.  

## 2019-01-02 NOTE — ED Notes (Signed)
Patient transported to X-ray 

## 2019-01-02 NOTE — ED Notes (Signed)
D/c reviewed with patient and spouse 

## 2019-01-02 NOTE — Discharge Instructions (Signed)
Please read and follow all provided instructions.  Your diagnoses today include:  1. Precordial pain   2. Hyperglycemia     Tests performed today include:  An EKG of your heart  A chest x-ray  Cardiac enzymes - a blood test for heart muscle damage  Blood counts and electrolytes  Vital signs. See below for your results today.   Medications prescribed:   None  Take any prescribed medications only as directed.  Follow-up instructions: Please follow-up with your primary care provider as soon as you can for further evaluation of your symptoms.   Return instructions:  SEEK IMMEDIATE MEDICAL ATTENTION IF:  You have severe chest pain, especially if the pain is crushing or pressure-like and spreads to the arms, back, neck, or jaw, or if you have sweating, nausea (feeling sick to your stomach), or shortness of breath. THIS IS AN EMERGENCY. Don't wait to see if the pain will go away. Get medical help at once. Call 911 or 0 (operator). DO NOT drive yourself to the hospital.   Your chest pain gets worse and does not go away with rest.   You have an attack of chest pain lasting longer than usual, despite rest and treatment with the medications your caregiver has prescribed.   You wake from sleep with chest pain or shortness of breath.  You feel dizzy or faint.  You have chest pain not typical of your usual pain for which you originally saw your caregiver.   You have any other emergent concerns regarding your health.  Additional Information: Chest pain comes from many different causes. Your caregiver has diagnosed you as having chest pain that is not specific for one problem, but does not require admission.  You are at low risk for an acute heart condition or other serious illness.   Your vital signs today were: BP 133/74    Pulse 60    Temp 98 F (36.7 C) (Oral)    Resp 15    Ht 6\' 3"  (1.905 m)    Wt 124.7 kg    SpO2 97%    BMI 34.37 kg/m  If your blood pressure (BP) was  elevated above 135/85 this visit, please have this repeated by your doctor within one month. --------------

## 2019-01-02 NOTE — ED Provider Notes (Signed)
[facility]ENC[facility] Provider Note   CSN: [id number] Arrival date & time: 01/02/19  [id number]     History   Chief Complaint Chief Complaint  Patient presents with  . Chest Pain    HPI Charlene BrookeCarl W Ching is a 63 y.o. male.  Patient with history of hypertension, diabetes, former smoker, pacemaker placement due to symptomatic bradycardia, otherwise no history of coronary artery disease -- presents with complaint of chest pain. Pain started acutely at rest this morning around 6am.  Patient was already awake.  He describes pain in his left upper chest with radiation to his left arm.  He had associated shortness of breath.  Symptoms lasted for about 30 seconds and then resolved.  He had a total of 3 episodes in 30 minutes.  He states that he felt better when he got up and went up and down some stairs.  Activity does not seem to make his symptoms worse or initiate symptoms.  No associated diaphoresis, vomiting.  Patient denies abdominal pain or urinary symptoms.  He was recently treated for sinusitis and has a residual nonproductive cough.  Patient denies risk factors for pulmonary embolism including: unilateral leg swelling, history of DVT/PE/other blood clots, use of exogenous hormones, recent immobilizations, recent surgery, recent travel (>4hr segment), malignancy, hemoptysis.       Past Medical History:  Diagnosis Date  . Anxiety   . Arthritis   . Bell palsy    > 30 years. Left side of face droops a little  . Bradycardia   . Cerebral aneurysm   . Cerebral aneurysm without rupture 06/2011   Middle of Cerebral Artery  . Complication of anesthesia 2012   after surgery for cliping of cerebral anerysum- In the room, patient was shaking, and patient would awaken and then "go back out" shaking continued after he "went back out:  . Constipation   . Diabetes mellitus    now under control  . Dysrhythmia    BRADYCARDIA REQUIRING PACEMAKER .FOLLOWED BY GREGG TAYLOR  .  HYPERTENSION 12/10/2007  . Pacemaker   . Shoulder pain    L SHOULDER. RECENT FALL  . Sleep apnea    CPAP.NOT WEARING NOW.NEEDS REPLACEMENT PIECE    Patient Active Problem List   Diagnosis Date Noted  . Fatty liver 06/18/2018  . Hepatotoxicity due to statin drug 10/19/2017  . Elevated LFTs 08/09/2017  . Chronic pruritic rash in adult 08/09/2017  . GAD (generalized anxiety disorder) 10/11/2016  . OSA on CPAP 09/24/2014  . Hyperlipidemia 09/24/2014  . Obesity 01/01/2014  . Other specified cardiac dysrhythmias(427.89) 07/09/2012  . Cerebral aneurysm without rupture 01/01/2012  . Pacemaker 10/07/2011  . Poorly controlled type 2 diabetes mellitus (HCC) 11/09/2008  . Essential hypertension 12/10/2007    Past Surgical History:  Procedure Laterality Date  . CARDIAC CATHETERIZATION  07/06/11   normal  . CEREBRAL ANEURYSM REPAIR  06/2011   "stenting and coiling" per patient  . FRACTURE SURGERY Left    foot  . INSERT / REPLACE / REMOVE PACEMAKER  2012  . KNEE SURGERY Left    left; arthoscopic  . rotator cuff surgery Left 02/2012  . septal deviation    . SHOULDER ARTHROSCOPY WITH SUBACROMIAL DECOMPRESSION, ROTATOR CUFF REPAIR AND BICEP TENDON REPAIR Right 08/24/2015   Procedure: RIGHT SHOULDER DIAGNOSTIC OPERATIVE ARTHROSCOPY WITH DEBRIDEMENT, SUBACROMIAL DECOMPRESSION, MIMI-OPEN ROTATOR CUFF TEAR REPAIR AND POSSIBLE  BICEPS TENODESIS.  ;  Surgeon: Cammy CopaScott Gregory Dean, MD;  Location: [facility] OR;  Service: Orthopedics;  Laterality: Right;  RIGHT SHOULDER DIAGNOSTIC OPERATIVE ARTHROSCOPY, DEBRIDEMENT, SUBACROMIAL DECOMPRESSION, MINI-OPEN ROTATOR CUFF TEAR REPAIR, POSSIB        Home Medications    Prior to Admission medications   Medication Sig Start Date End Date Taking? Authorizing Provider  aspirin EC 81 MG tablet Take 81 mg by mouth daily.    [provider]  glimepiride (AMARYL) 4 MG tablet Take 1 tablet (4 mg total) by mouth daily before breakfast. 05/07/18   Shelva Majestic, MD    metFORMIN (GLUCOPHAGE) 1000 MG tablet Take 1 tablet (1,000 mg total) by mouth 2 (two) times daily with a meal. 12/09/18   Shelva Majestic, MD  metoprolol succinate (TOPROL-XL) 100 MG 24 hr tablet TAKE 1 TABLET EVERY MORNING 05/06/18   Shelva Majestic, MD    Family History Family History  Problem Relation Age of Onset  . Heart attack Mother        died 72, smoker  . Heart disease Mother   . Diabetes Mother   . Heart attack Father        died 37, smoker  . Heart disease Father     Social History Social History   Tobacco Use  . Smoking status: Former Smoker    Packs/day: 1.00    Years: 20.00    Pack years: 20.00    Types: Cigarettes    Last attempt to quit: 07/05/2011    Years since quitting: 7.5  . Smokeless tobacco: Never Used  Substance Use Topics  . Alcohol use: No    Alcohol/week: 0.0 standard drinks  . Drug use: No     Allergies   Losartan potassium-hctz; Prednisone; Doxycycline; and Lisinopril   Review of Systems Review of Systems  Constitutional: Negative for diaphoresis and fever.  Eyes: Negative for redness.  Respiratory: Negative for cough and shortness of breath.   Cardiovascular: Positive for chest pain. Negative for palpitations and leg swelling.  Gastrointestinal: Negative for abdominal pain, nausea and vomiting.  Genitourinary: Negative for dysuria.  Musculoskeletal: Negative for back pain and neck pain.  Skin: Negative for rash.  Neurological: Negative for syncope and light-headedness.  Psychiatric/Behavioral: The patient is not nervous/anxious.      Physical Exam Updated Vital Signs BP (!) 170/83 (BP Location: Left Arm)   Pulse 66   Temp 98 F (36.7 C) (Oral)   Resp 17   Ht 6\' 3"  (1.905 m)   Wt 124.7 kg   SpO2 94%   BMI 34.37 kg/m   Physical Exam Vitals signs and nursing note reviewed.  Constitutional:      Appearance: He is well-developed.  HENT:     Head: Normocephalic and atraumatic.  Eyes:     General:        Right eye: No  discharge.        Left eye: No discharge.     Conjunctiva/sclera: Conjunctivae normal.  Neck:     Musculoskeletal: Normal range of motion and neck supple.  Cardiovascular:     Rate and Rhythm: Normal rate and regular rhythm.     Heart sounds: Normal heart sounds.  Pulmonary:     Effort: Pulmonary effort is normal.     Breath sounds: Normal breath sounds.  Abdominal:     Palpations: Abdomen is soft.     Tenderness: There is no abdominal tenderness.  Skin:    General: Skin is warm and dry.  Neurological:     Mental Status: He is alert.  ED Treatments / Results  Labs (all labs ordered are listed, but only abnormal results are displayed) Labs Reviewed  BASIC METABOLIC PANEL - Abnormal; Notable for the following components:      Result Value   Glucose, Bld 333 (*)    BUN 5 (*)    All other components within normal limits  CBC  I-STAT TROPONIN, ED    EKG EKG Interpretation  Date/Time:  Thursday January 02 2019 06:50:16 EST Ventricular Rate:  66 PR Interval:  208 QRS Duration: 102 QT Interval:  416 QTC Calculation: 436 R Axis:   -32 Text Interpretation:  Normal sinus rhythm Left axis deviation Abnormal ECG Confirmed by Virgina NorfolkAdam, [place] [phone]([id number]) on 01/02/2019 7:47:16 AM   Radiology Dg Chest 2 View  Result Date: 01/02/2019 CLINICAL DATA:  Chest pain EXAM: CHEST - 2 VIEW COMPARISON:  02/13/2018 FINDINGS: Dual-chamber pacer leads from the left in unremarkable position. There is no edema, consolidation, effusion, or pneumothorax. Normal heart size and mediastinal contours. Spondylosis. IMPRESSION: No evidence of active disease. Electronically Signed   By: Marnee SpringJonathon  Watts M.D.   On: 01/02/2019 07:17    Procedures Procedures (including critical care time)  Medications Ordered in ED Medications  sodium chloride flush (NS) 0.9 % injection 3 mL (has no administration in time range)     Initial Impression / Assessment and Plan / ED Course  I have reviewed the triage  vital signs and the nursing notes.  Pertinent labs & imaging results that were available during my care of the patient were reviewed by me and considered in my medical decision making (see chart for details).     Patient seen and examined. Work-up initiated. Medications ordered.   Vital signs reviewed and are as follows: BP (!) 170/83 (BP Location: Left Arm)   Pulse 66   Temp 98 F (36.7 C) (Oral)   Resp 17   Ht 6\' 3"  (1.905 m)   Wt 124.7 kg   SpO2 94%   BMI 34.37 kg/m   Initial EKG, troponin, chest x-ray are reassuring.  Will check delta troponin at 10 AM.  9:57 AM Repeat marker/EKG ordered. Pt stable during ED stay without new symptoms.   10:49 AM repeat EKG unchanged, second troponin unchanged.  Patient and wife updated on results.  Comfortable with discharge to home at this time.  Patient was counseled to return with severe chest pain, especially if the pain is crushing or pressure-like and spreads to the arms, back, neck, or jaw, or if they have sweating, nausea, or shortness of breath with the pain. They were encouraged to call 911 with these symptoms.   They were also told to return if their chest pain gets worse and does not go away with rest, they have an attack of chest pain lasting longer than usual despite rest and treatment with the medications their caregiver has prescribed, if they wake from sleep with chest pain or shortness of breath, if they feel dizzy or faint, if they have chest pain not typical of their usual pain, or if they have any other emergent concerns regarding their health.  The patient verbalized understanding and agreed.    Final Clinical Impressions(s) / ED Diagnoses   Final diagnoses:  Precordial pain  Hyperglycemia   CP: Patient with short-lived atypical left chest pain episodes this morning.  Patient was evaluated with troponin x2, EKG x2.  No concerning cardiac findings.  Chest x-ray is clear without signs of pneumonia or pneumothorax.  He  does not  have other signs and symptoms or history consistent with pulmonary embolism or DVT.  Doubt dissection given that chest pain is resolved and without other symptoms.  No indications for admission to the hospital at this time.  Strongly encouraged PCP follow-up for further evaluation of chest pain and for management of his blood sugar.  Hyperglycemia: No ketosis.  Patient will resume his home medications later today.  ED Discharge Orders    None       Renne Crigler, PA-C 01/02/19 1051    Virgina Norfolk, DO 01/02/19 1725

## 2019-01-02 NOTE — ED Triage Notes (Signed)
C/o sharp pain to L chest that radiates to L arm with SOB x 30 minutes.   Denies nausea and vomiting.  Feels lightheaded.

## 2019-01-13 ENCOUNTER — Ambulatory Visit: Payer: Self-pay | Admitting: *Deleted

## 2019-01-13 ENCOUNTER — Ambulatory Visit (INDEPENDENT_AMBULATORY_CARE_PROVIDER_SITE_OTHER): Payer: Medicare HMO | Admitting: Family Medicine

## 2019-01-13 ENCOUNTER — Encounter: Payer: Self-pay | Admitting: Family Medicine

## 2019-01-13 VITALS — BP 142/70 | HR 61 | Temp 97.8°F | Ht 75.0 in | Wt 279.4 lb

## 2019-01-13 DIAGNOSIS — R6889 Other general symptoms and signs: Secondary | ICD-10-CM

## 2019-01-13 LAB — POCT INFLUENZA A/B
Influenza A, POC: NEGATIVE
Influenza B, POC: NEGATIVE

## 2019-01-13 MED ORDER — GUAIFENESIN-CODEINE 100-10 MG/5ML PO SOLN: 10.0000 mL | Freq: Three times a day (TID) | ORAL | 0 refills | Status: DC | PRN

## 2019-01-13 NOTE — Telephone Encounter (Signed)
Patient is calling with fever since Friday- he also has sore throat and cough. Patient is requesting appointment- patient has not traveled.  Reason for Disposition . [1] Fever > 101 F (38.3 C) AND [2] age > 3760  Answer Assessment - Initial Assessment Questions 1. TEMPERATURE: "What is the most recent temperature?"  "How was it measured?"      101, temperal 2. ONSET: "When did the fever start?"      Fever started Friday night 3. SYMPTOMS: "Do you have any other symptoms besides the fever?"  (e.g., colds, headache, sore throat, earache, cough, rash, diarrhea, vomiting, abdominal pain)     Cough, sore throat 4. CAUSE: If there are no symptoms, ask: "What do you think is causing the fever?"      n/a 5. CONTACTS: "Does anyone else in the family have an infection?"     No- hospital the other week 6. TREATMENT: "What have you done so far to treat this fever?" (e.g., medications)     Tylenol, hydrated, Advil 7. IMMUNOCOMPROMISE: "Do you have of the following: diabetes, HIV positive, splenectomy, cancer chemotherapy, chronic steroid treatment, transplant patient, etc."      diabetes 8. PREGNANCY: "Is there any chance you are pregnant?" "When was your last menstrual period?"     n/a 9. TRAVEL: "Have you traveled out of the country in the last month?" (e.g., travel history, exposures)     No travel  Protocols used: FEVER-A-AH

## 2019-01-13 NOTE — Progress Notes (Signed)
Patient: Charlene BrookeCarl W Geister MRN: [id number] DOB: 06/08/1956 PCP: Shelva MajesticHunter, Stephen O, MD     Subjective:  Chief Complaint  Patient presents with  . Fever  . Cough  . Generalized Body Aches    HPI: The patient is a 63 y.o. male who presents today for fever, cough and body aches x 3 days. He started feeling bad Friday night with fever (102), body aches, cough. His fever went away today and this is the first time it has gone away. They have been giving him tylenol and advil. Last time was 10 after 7am this morning. Cough is dry in nature he doesn't really have sinus pain or pressure. He has no shortness of breath or wheezing. Denies any congestion, rhinorrhea or ear pain. No abdominal pain, N/V/D. His stomach and back hurt from coughing so bad. No sick contacts that he is aware of. He was in ER on 01/02/19 for atypical chest pain with negative work up. Has not followed up with pcp for this. He has not taken anything over the counter for cough. He has no hx of asthma or copd. Is not a smoker, remote past history. Poorly controlled diabetic.   No travel, recent treatment 4 weeks ago for sinus infection with augmentin.   Review of Systems  Constitutional: Positive for chills, fatigue and fever.  Eyes: Positive for pain.  Respiratory: Positive for cough and chest tightness. Negative for shortness of breath.   Cardiovascular: Negative for chest pain.  Gastrointestinal: Negative for abdominal pain, diarrhea, nausea and vomiting.  Musculoskeletal: Positive for back pain and myalgias. Negative for neck pain.       C/o generalized body aches  Feels sore in chest/back from excessive coughing  Neurological: Negative for dizziness and headaches.    Allergies Patient is allergic to losartan potassium-hctz; prednisone; doxycycline; and lisinopril.  Past Medical History Patient  has a past medical history of Anxiety, Arthritis, Bell palsy, Bradycardia, Cerebral aneurysm, Cerebral aneurysm without rupture  (06/2011), Complication of anesthesia (2012), Constipation, Diabetes mellitus, Dysrhythmia, HYPERTENSION (12/10/2007), Pacemaker, Shoulder pain, and Sleep apnea.  Surgical History Patient  has a past surgical history that includes septal deviation; Knee surgery (Left); Insert / replace / remove pacemaker (2012); rotator cuff surgery (Left, 02/2012); Cardiac catheterization (07/06/11); Fracture surgery (Left); Cerebral aneurysm repair (06/2011); and Shoulder arthroscopy with subacromial decompression, rotator cuff repair and bicep tendon repair (Right, 08/24/2015).  Family History Pateint's family history includes Diabetes in his mother; Heart attack in his father and mother; Heart disease in his father and mother.  Social History Patient  reports that he quit smoking about 7 years ago. His smoking use included cigarettes. He has a 20.00 pack-year smoking history. He has never used smokeless tobacco. He reports that he does not drink alcohol or use drugs.    Objective: Vitals:   01/13/19 1353  BP: (!) 142/70  Pulse: 61  Temp: 97.8 F (36.6 C)  TempSrc: Oral  SpO2: 97%  Weight: 279 lb 6.4 oz (126.7 kg)  Height: 6\' 3"  (1.905 m)    Body mass index is 34.92 kg/m.  Physical Exam Vitals signs reviewed.  Constitutional:      Appearance: Normal appearance.  HENT:     Right Ear: There is impacted cerumen.     Left Ear: There is impacted cerumen.     Nose: Nose normal.     Comments: No TTP over sinuses     Mouth/Throat:     Mouth: Mucous membranes are moist.  Pharynx: No oropharyngeal exudate.  Neck:     Musculoskeletal: Normal range of motion and neck supple.  Cardiovascular:     Rate and Rhythm: Normal rate and regular rhythm.     Heart sounds: Normal heart sounds.  Pulmonary:     Effort: Pulmonary effort is normal. No respiratory distress.     Breath sounds: Normal breath sounds. No wheezing or rales.  Abdominal:     General: Abdomen is flat. Bowel sounds are normal.      Palpations: Abdomen is soft.  Lymphadenopathy:     Cervical: No cervical adenopathy.  Skin:    General: Skin is warm.     Capillary Refill: Capillary refill takes less than 2 seconds.  Neurological:     General: No focal deficit present.     Mental Status: He is alert and oriented to person, place, and time.    Flu: negative     Assessment/plan: 1. Flu-like symptoms Discussed this appears to be viral in nature and flu like despite negative test. They declined tamiflu due to expense. Discussed conservative therapy with antipyretics, cool mist humidifier, honey (moderation with diabetes) and otc cough medication. Could do coricidine with blood pressure or robitussin would be safe for heart. Giving him codeine cough syrup at night to hopefully help with rest from his cough. They decline tessalon pearls/inhaler. The wife became very irritated with me that I was not giving an antibiotic or doing more. I discussed there is no indication of a bacterial infection at this point and antibiotics are not warranted at this time. She then stated she knows that there is an infection with a fever and I tried to explain that you will see high fever with viruses. This is very common and typical for a viral illness. Again, tried to explain that antibiotics do not treat viruses and I could cause more harm than help. She again said I was going to let him suffer with fever. I tried to again discuss supportive therapy at this time and he has no fever.   Again stressed if fever continues past the next 48 hours or symptoms worsen then he needs to come back in for CXR or further eval, but right now exam very reassuring. Drowsy precautions given for codeine. Does have OSA, but no known lung disease.  - POCT Influenza A/B    Return if symptoms worsen or fail to improve.   Orland Mustard, MD [facility]   01/13/2019

## 2019-01-13 NOTE — Telephone Encounter (Signed)
Noted  

## 2019-01-13 NOTE — Telephone Encounter (Signed)
See note

## 2019-01-13 NOTE — Patient Instructions (Addendum)
coricedin cough syrup for the day Codeine cough syrup at night  Cool mist humidifier at night Honey is good for cough. Would put in hot tea  Likely has the flu! Stay home. Wash hands.    Influenza, Adult Influenza is also called "the flu." It is an infection in the lungs, nose, and throat (respiratory tract). It is caused by a virus. The flu causes symptoms that are similar to symptoms of a cold. It also causes a high fever and body aches. The flu spreads easily from person to person (is contagious). Getting a flu shot (influenza vaccination) every year is the best way to prevent the flu. What are the causes? This condition is caused by the influenza virus. You can get the virus by:  Breathing in droplets that are in the air from the cough or sneeze of a person who has the virus.  Touching something that has the virus on it (is contaminated) and then touching your mouth, nose, or eyes. What increases the risk? Certain things may make you more likely to get the flu. These include:  Not washing your hands often.  Having close contact with many people during cold and flu season.  Touching your mouth, eyes, or nose without first washing your hands.  Not getting a flu shot every year. You may have a higher risk for the flu, along with serious problems such as a lung infection (pneumonia), if you:  Are older than 65.  Are pregnant.  Have a weakened disease-fighting system (immune system) because of a disease or taking certain medicines.  Have a long-term (chronic) illness, such as: ? Heart, kidney, or lung disease. ? Diabetes. ? Asthma.  Have a liver disorder.  Are very overweight (morbidly obese).  Have anemia. This is a condition that affects your red blood cells. What are the signs or symptoms? Symptoms usually begin suddenly and last 4-14 days. They may include:  Fever and chills.  Headaches, body aches, or muscle aches.  Sore throat.  Cough.  Runny or stuffy  (congested) nose.  Chest discomfort.  Not wanting to eat as much as normal (poor appetite).  Weakness or feeling tired (fatigue).  Dizziness.  Feeling sick to your stomach (nauseous) or throwing up (vomiting). How is this treated? If the flu is found early, you can be treated with medicine that can help reduce how bad the illness is and how long it lasts (antiviral medicine). This may be given by mouth (orally) or through an IV tube. Taking care of yourself at home can help your symptoms get better. Your doctor may suggest:  Taking over-the-counter medicines.  Drinking plenty of fluids. The flu often goes away on its own. If you have very bad symptoms or other problems, you may be treated in a hospital. Follow these instructions at home:     Activity  Rest as needed. Get plenty of sleep.  Stay home from work or school as told by your doctor. ? Do not leave home until you do not have a fever for 24 hours without taking medicine. ? Leave home only to visit your doctor. Eating and drinking  Take an ORS (oral rehydration solution). This is a drink that is sold at pharmacies and stores.  Drink enough fluid to keep your pee (urine) pale yellow.  Drink clear fluids in small amounts as you are able. Clear fluids include: ? Water. ? Ice chips. ? Fruit juice that has water added (diluted fruit juice). ? Low-calorie sports drinks.  Eat bland, easy-to-digest foods in small amounts as you are able. These foods include: ? Bananas. ? Applesauce. ? Rice. ? Lean meats. ? Toast. ? Crackers.  Do not eat or drink: ? Fluids that have a lot of sugar or caffeine. ? Alcohol. ? Spicy or fatty foods. General instructions  Take over-the-counter and prescription medicines only as told by your doctor.  Use a cool mist humidifier to add moisture to the air in your home. This can make it easier for you to breathe.  Cover your mouth and nose when you cough or sneeze.  Wash your hands  with soap and water often, especially after you cough or sneeze. If you cannot use soap and water, use alcohol-based hand sanitizer.  Keep all follow-up visits as told by your doctor. This is important. How is this prevented?   Get a flu shot every year. You may get the flu shot in late summer, fall, or winter. Ask your doctor when you should get your flu shot.  Avoid contact with people who are sick during fall and winter (cold and flu season). Contact a doctor if:  You get new symptoms.  You have: ? Chest pain. ? Watery poop (diarrhea). ? A fever.  Your cough gets worse.  You start to have more mucus.  You feel sick to your stomach.  You throw up. Get help right away if you:  Have shortness of breath.  Have trouble breathing.  Have skin or nails that turn a bluish color.  Have very bad pain or stiffness in your neck.  Get a sudden headache.  Get sudden pain in your face or ear.  Cannot eat or drink without throwing up. Summary  Influenza ("the flu") is an infection in the lungs, nose, and throat. It is caused by a virus.  Take over-the-counter and prescription medicines only as told by your doctor.  Getting a flu shot every year is the best way to avoid getting the flu. This information is not intended to replace advice given to you by your health care provider. Make sure you discuss any questions you have with your health care provider. Document Released: 08/29/2008 Document Revised: 05/08/2018 Document Reviewed: 05/08/2018 Elsevier Interactive Patient Education  2019 [employer].

## 2019-01-20 ENCOUNTER — Encounter: Payer: Self-pay | Admitting: Family Medicine

## 2019-01-20 ENCOUNTER — Ambulatory Visit (INDEPENDENT_AMBULATORY_CARE_PROVIDER_SITE_OTHER): Payer: Medicare HMO

## 2019-01-20 ENCOUNTER — Ambulatory Visit (INDEPENDENT_AMBULATORY_CARE_PROVIDER_SITE_OTHER): Payer: Medicare HMO | Admitting: Family Medicine

## 2019-01-20 VITALS — BP 110/68 | HR 60 | Temp 98.1°F | Ht 75.0 in | Wt 276.2 lb

## 2019-01-20 DIAGNOSIS — E1165 Type 2 diabetes mellitus with hyperglycemia: Secondary | ICD-10-CM

## 2019-01-20 DIAGNOSIS — I10 Essential (primary) hypertension: Secondary | ICD-10-CM | POA: Diagnosis not present

## 2019-01-20 DIAGNOSIS — R0602 Shortness of breath: Secondary | ICD-10-CM | POA: Diagnosis not present

## 2019-01-20 DIAGNOSIS — R059 Cough, unspecified: Secondary | ICD-10-CM

## 2019-01-20 DIAGNOSIS — Z6834 Body mass index (BMI) 34.0-34.9, adult: Secondary | ICD-10-CM | POA: Diagnosis not present

## 2019-01-20 DIAGNOSIS — R05 Cough: Secondary | ICD-10-CM

## 2019-01-20 DIAGNOSIS — E785 Hyperlipidemia, unspecified: Secondary | ICD-10-CM | POA: Diagnosis not present

## 2019-01-20 DIAGNOSIS — E1169 Type 2 diabetes mellitus with other specified complication: Secondary | ICD-10-CM | POA: Diagnosis not present

## 2019-01-20 LAB — POCT GLYCOSYLATED HEMOGLOBIN (HGB A1C): Hemoglobin A1C: 11.4 % — AB (ref 4.0–5.6)

## 2019-01-20 MED ORDER — "INSULIN SYRINGE 29G X 1/2"" 0.5 ML MISC": 11 refills | Status: DC

## 2019-01-20 MED ORDER — INSULIN NPH ISOPHANE & REGULAR (70-30) 100 UNIT/ML ~~LOC~~ SUSP: SUBCUTANEOUS | 11 refills | Status: DC

## 2019-01-20 NOTE — Assessment & Plan Note (Signed)
#  elevated LFTs S: Patient has been off of statin due to elevated LFTs.  History of hepatotoxicity due to statins. Lab Results  Component Value Date   ALT 48 09/20/2018   AST 52 (H) 09/20/2018   ALKPHOS 54 09/20/2018   BILITOT 1.1 09/20/2018  A/P: likely poor control. Continue very low dose statin like lovastatin 10mg  once a week at follow up - would need close LFT follow up

## 2019-01-20 NOTE — Progress Notes (Signed)
Phone [phone]   Subjective:  Paul Paul is a 63 y.o. year old very pleasant male patient who presents for/with See problem oriented charting ROS-continued cough and congestion.  No fever or chills.  Does feel mildly short of breath-particularly with coughing fits  BMI monitoring- elevated BMI noted: Body mass index is 34.52 kg/m. Encouraged need for healthy eating, regular exercise, weight loss.     BMI Metric Follow Up - 01/20/19 0946      BMI Metric Follow Up-Please document annually   BMI Metric Follow Up  Education provided       Past Medical History-  Patient Active Problem List   Diagnosis Date Noted  . Cerebral aneurysm without rupture 01/01/2012    Priority: High  . Pacemaker 10/07/2011    Priority: High  . Poorly controlled type 2 diabetes mellitus (HCC) 11/09/2008    Priority: High  . Fatty liver 06/18/2018    Priority: Medium  . 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 Elevated LFTs 08/09/2017    Priority: Medium  . GAD (generalized anxiety disorder) 10/11/2016    Priority: Medium  . OSA on CPAP 09/24/2014    Priority: Medium  . Hyperlipidemia associated with type 2 diabetes mellitus (HCC) 09/24/2014    Priority: Medium  . Other specified cardiac dysrhythmias(427.89) 07/09/2012    Priority: Medium  . Essential hypertension 12/10/2007    Priority: Medium  . Obesity 01/01/2014    Priority: Low  . Chronic pruritic rash in adult 08/09/2017    Medications- reviewed and updated Current Outpatient Medications  Medication Sig Dispense Refill  . aspirin EC 81 MG tablet Take 81 mg by mouth daily.    Marland Kitchen glimepiride (AMARYL) 4 MG tablet Take 1 tablet (4 mg total) by mouth daily before breakfast. 90 tablet 3  . ibuprofen (ADVIL,MOTRIN) 400 MG tablet Take 400 mg by mouth every 6 (six) hours as needed.    . metFORMIN (GLUCOPHAGE) 1000 MG tablet Take 1 tablet (1,000 mg total) by mouth 2 (two) times daily with a meal. 180 tablet 3  .  metoprolol succinate (TOPROL-XL) 100 MG 24 hr tablet TAKE 1 TABLET EVERY MORNING 90 tablet 2   No current facility-administered medications for this visit.      Objective:  BP 110/68 (BP Location: Left Arm, Patient Position: Sitting, Cuff Size: Large)   Pulse 60   Temp 98.1 F (36.7 C) (Oral)   Ht 6\' 3"  (1.905 m)   Wt 276 lb 3.2 oz (125.3 kg)   SpO2 97%   BMI 34.52 kg/m  Gen: NAD, resting comfortably Cerumen noted bilateral ears.  Denies hearing issues.  Yellow nasal discharge without sinus tenderness.  Oropharynx-mild pharyngeal erythema only CV: RRR no murmurs rubs or gallops Lungs: CTAB no crackles, wheeze, rhonchi Abdomen: soft/nontender/nondistended/normal bowel sounds. No rebound or guarding.  Ext: no edema Skin: warm, dry    Assessment and Plan   Poorly controlled type 2 diabetes mellitus (HCC) S: 6 weeks ago we restarted metformin 1000 mg twice a day-patient stopped metformin when we added glimepiride 4 mg-plan was to continue both.  There are better options than glimepiride but finances are a major concern  When he eats well and exercises sugars down into 100s- when doesn't gets well over 200- feels lik e llness is holding him back A/P: very poor control. Will continue above meds and add insulin "I want you to start Relion 70/30 insulin but I am referring you to diabetes education  so they can walk you through how to use this. May want to take your insulin with you in a cooler so they can show you how to use this- verify with them before you go that they can help with this on first day of visit.   See me back a week after you start the insulin. I want you to start with just 5 units twice a day. Call me for any blood sugar under 70 immediately after getting " - discussed lifestyle changes- he does not have high confidence at present to make these changes  Hyperlipidemia associated with type 2 diabetes mellitus (HCC) #elevated LFTs S: Patient has been off of statin due  to elevated LFTs.  History of hepatotoxicity due to statins. Lab Results  Component Value Date   ALT 48 09/20/2018   AST 52 (H) 09/20/2018   ALKPHOS 54 09/20/2018   BILITOT 1.1 09/20/2018  A/P: likely poor control. Continue very low dose statin like lovastatin 10mg  once a week at follow up - would need close LFT follow up  %History of cerebral aneurysm without rupture. Follows with [place] [facility] - in 2012 had 2 stents and coils.  - After seeing dr. Corliss Skains with IR will let me know about colonoscopy (declines for now)   #Hypertension S: Controlled on metoprolol 100 mg extended release A/P: Stable. Continue current medications.     #pacemaker due to history symptomatic bradycardia S: Patient follows with Dr. Ladona Ridgel.  He has a pacemaker due to history of symptomatic bradycardia A/P: normal HR today- continue cardiology follo wup  # Respiratory illness.  S: robitussen somewhat helpful.  Now almost 2 weeks fo cough- seen last week and diagnosed with potential influenza. Fever gone.No sinus pressure or congestion. Did have a nosebleed with severity of cough. Can feel winded with this - started sometime after last visit.  A/P: Due to him feeling winded at times will get chest x-ray today. Discussed if reassuring x-ray- this could be up to 4-6 weeks in relation to bronchitis- follow up in 2 weeks if not improving or symptoms worsen  No future appointments. No follow-ups on file.  Lab/Order associations: Poorly controlled type 2 diabetes mellitus (HCC) - Plan: POCT glycosylated hemoglobin (Hb A1C), Amb Referral to Nutrition and Diabetic E  Cough - Plan: DG Chest 2 View  BMI 34.0-34.9,adult  Hyperlipidemia associated with type 2 diabetes mellitus (HCC)  Meds ordered this encounter  Medications  . insulin NPH-regular Human (70-30) 100 UNIT/ML injection    Sig: Take 5-15 units twice a day before breakfast and dinner. Start with just 5 units. Relion brand- told patient about $25 per vial     Dispense:  10 mL    Refill:  11  . INSULIN SYRINGE .5CC/29G (RELION INSULIN SYR 0.5CC/29G) 29G X 1/2" 0.5 ML MISC    Sig: Use to inject insulin twice daily. Relion brand. For pharmacy- may substitute syringe per typical/patient preference    Dispense:  100 each    Refill:  11    Return precautions advised.  Tana Conch, MD

## 2019-01-20 NOTE — Assessment & Plan Note (Signed)
S: 6 weeks ago we restarted metformin 1000 mg twice a day-patient stopped metformin when we added glimepiride 4 mg-plan was to continue both.  There are better options than glimepiride but finances are a major concern  When he eats well and exercises sugars down into 100s- when doesn't gets well over 200- feels lik e llness is holding him back A/P: very poor control. Will continue above meds and add insulin "I want you to start Relion 70/30 insulin but I am referring you to diabetes education so they can walk you through how to use this. May want to take your insulin with you in a cooler so they can show you how to use this- verify with them before you go that they can help with this on first day of visit.   See me back a week after you start the insulin. I want you to start with just 5 units twice a day. Call me for any blood sugar under 70 immediately after getting "

## 2019-01-20 NOTE — Patient Instructions (Addendum)
Health Maintenance Due  Topic Date Due  . COLONOSCOPY Pt declined for now- needs to see Dr. Corliss Skains first 02/24/2017  . OPHTHALMOLOGY EXAM Please stop by the front desk to sign a release of information for [facility].  05/22/2017   I want you to start Relion 70/30 insulin but I am referring you to diabetes education so they can walk you through how to use this. May want to take your insulin with you in a cooler so they can show you how to use this- verify with them before you go that they can help with this on first day of visit.   See me back a week after you start the insulin. I want you to start with just 5 units twice a day. Call me for any blood sugar under 70 immediately after getting   Please stop by x-ray before you go If you do not have mychart- we will call you about results within 5 business days of [place] receiving them.  If you have mychart- we will send your results within 3 business days of [place] receiving them.  If abnormal or we want to clarify a result, we will call or mychart you to make sure you receive the message.  If you have questions or concerns or don't hear within 5-7 days, please send [place] a message or call us.

## 2019-02-18 DIAGNOSIS — B86 Scabies: Secondary | ICD-10-CM | POA: Diagnosis not present

## 2019-02-21 ENCOUNTER — Other Ambulatory Visit: Payer: Self-pay | Admitting: Family Medicine

## 2019-03-03 ENCOUNTER — Telehealth: Payer: Self-pay | Admitting: Family Medicine

## 2019-03-03 NOTE — Telephone Encounter (Signed)
Copied from CRM [id number]. Topic: Quick Communication - Rx Refill/Question >> Mar 03, 2019  3:12 PM Gwenlyn Fudge A wrote: Medication: pt called in wanting a refill for glimepiride (AMARYL) 4 MG tablet [[id number]]. Pt changed pharmacies and would like a request sent to the new pharmacy.   Has the patient contacted their pharmacy? Yes.   (Agent: If no, request that the patient contact the pharmacy for the refill.) (Agent: If yes, when and what did the pharmacy advise?)  Preferred Pharmacy (with phone number or street name): [facility] - [place], [place] - [address] [place] [address] Phone: [phone] Fax: [phone] Not a 24 hour pharmacy; exact hours not known.    Agent: Please be advised that RX refills may take up to 3 business days. We ask that you follow-up with your pharmacy.

## 2019-03-03 NOTE — Telephone Encounter (Signed)
Please call and schedule a web ex.

## 2019-03-03 NOTE — Telephone Encounter (Signed)
Called the patient to see if he would like to set up a [facility] appt and patient told me that he didn't have any technology and said goodbye.

## 2019-03-12 ENCOUNTER — Telehealth: Payer: Self-pay

## 2019-03-12 MED ORDER — GLIMEPIRIDE 4 MG PO TABS: 4.0000 mg | ORAL_TABLET | Freq: Every day | ORAL | 3 refills | Status: DC

## 2019-03-12 NOTE — Telephone Encounter (Signed)
Pharmacy request Glimepiride 4mg  #90/3

## 2019-03-13 ENCOUNTER — Other Ambulatory Visit: Payer: Self-pay

## 2019-03-14 NOTE — Telephone Encounter (Signed)
[employer] calling to check status of medication order request - 90 day supply of glimepiride. Please advise.

## 2019-03-17 NOTE — Telephone Encounter (Signed)
See note

## 2019-03-17 NOTE — Telephone Encounter (Signed)
Rx sent electronically.  Thursday, 04/16//20.

## 2019-04-22 ENCOUNTER — Telehealth: Payer: Self-pay | Admitting: Family Medicine

## 2019-04-22 MED ORDER — GLIMEPIRIDE 4 MG PO TABS: 4.0000 mg | ORAL_TABLET | Freq: Every day | ORAL | 1 refills | Status: DC

## 2019-04-22 NOTE — Telephone Encounter (Signed)
Last OV 01/20/2019  Rx refill sent to [facility]

## 2019-04-22 NOTE — Telephone Encounter (Signed)
Copied from CRM [id number]. Topic: General - Other >> Apr 22, 2019  3:02 PM Jaquita Rector A wrote: Reason for CRM: [facility] called to say that they never received the Rx for glimepiride (AMARYL) 4 MG tablet that was printed on 03/12/2019 asking for a new Rx sent out please patient is requesting a refill.

## 2019-04-22 NOTE — Addendum Note (Signed)
Addended by: Dierdre Searles on: 04/22/2019 03:51 PM   Modules accepted: Orders

## 2019-06-10 ENCOUNTER — Encounter: Payer: Self-pay | Admitting: Family Medicine

## 2019-06-10 ENCOUNTER — Ambulatory Visit (INDEPENDENT_AMBULATORY_CARE_PROVIDER_SITE_OTHER): Payer: Medicare HMO | Admitting: Family Medicine

## 2019-06-10 VITALS — Ht 75.0 in | Wt 270.0 lb

## 2019-06-10 DIAGNOSIS — E1165 Type 2 diabetes mellitus with hyperglycemia: Secondary | ICD-10-CM | POA: Diagnosis not present

## 2019-06-10 DIAGNOSIS — E785 Hyperlipidemia, unspecified: Secondary | ICD-10-CM | POA: Diagnosis not present

## 2019-06-10 DIAGNOSIS — E1169 Type 2 diabetes mellitus with other specified complication: Secondary | ICD-10-CM | POA: Diagnosis not present

## 2019-06-10 DIAGNOSIS — I10 Essential (primary) hypertension: Secondary | ICD-10-CM | POA: Diagnosis not present

## 2019-06-10 DIAGNOSIS — E1159 Type 2 diabetes mellitus with other circulatory complications: Secondary | ICD-10-CM

## 2019-06-10 DIAGNOSIS — T466X5A Adverse effect of antihyperlipidemic and antiarteriosclerotic drugs, initial encounter: Secondary | ICD-10-CM

## 2019-06-10 DIAGNOSIS — K719 Toxic liver disease, unspecified: Secondary | ICD-10-CM | POA: Diagnosis not present

## 2019-06-10 MED ORDER — LOVASTATIN 20 MG PO TABS: 20.0000 mg | ORAL_TABLET | ORAL | 3 refills | Status: DC

## 2019-06-10 MED ORDER — GLIMEPIRIDE 4 MG PO TABS: 8.0000 mg | ORAL_TABLET | Freq: Every day | ORAL | 1 refills | Status: DC

## 2019-06-10 NOTE — Progress Notes (Signed)
Phone [phone]   Subjective:  Virtual visit via Video note. Chief complaint: Chief Complaint  Patient presents with  . Diabetes    Medication refill    This visit type was conducted due to national recommendations for restrictions regarding the COVID-19 Pandemic (e.g. social distancing).  This format is felt to be most appropriate for this patient at this time balancing risks to patient and risks to population by having him in for in person visit.  No physical exam was performed (except for noted visual exam or audio findings with Telehealth visits).    Our team/I connected with Paul Paul at 11:20 AM EDT by a video enabled telemedicine application (doxy.me or caregility through epic) and verified that I am speaking with the correct person using two identifiers.  Location patient: [place] Location provider: [facility], office Persons participating in the virtual visit:  patient  Our team/I discussed the limitations of evaluation and management by telemedicine and the availability of in person appointments. In light of current covid-19 pandemic, patient also understands that we are trying to protect them by minimizing in office contact if at all possible.  The patient expressed consent for telemedicine visit and agreed to proceed. Patient understands insurance will be billed.   ROS-no fever/chills reported.  No chest pain or shortness of breath reported-doing well with his walks.  Has intentional weight loss  Past Medical History-  Patient Active Problem List   Diagnosis Date Noted  . Cerebral aneurysm without rupture 01/01/2012    Priority: High  . Pacemaker 10/07/2011    Priority: High  . Poorly controlled type 2 diabetes mellitus ([facility]) 11/09/2008    Priority: High  . Fatty liver 06/18/2018    Priority: Medium  . 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 Elevated LFTs 08/09/2017    Priority: Medium  . GAD (generalized anxiety disorder)  10/11/2016    Priority: Medium  . OSA on CPAP 09/24/2014    Priority: Medium  . Hyperlipidemia associated with type 2 diabetes mellitus ([place]) 09/24/2014    Priority: Medium  . Other specified cardiac dysrhythmias(427.89) 07/09/2012    Priority: Medium  . Hypertension associated with diabetes ([place]) 12/10/2007    Priority: Medium  . Obesity 01/01/2014    Priority: Low  . Chronic pruritic rash in adult 08/09/2017    Medications- reviewed and updated Current Outpatient Medications  Medication Sig Dispense Refill  . aspirin EC 81 MG tablet Take 81 mg by mouth daily.    Marland Kitchen glimepiride (AMARYL) 4 MG tablet Take 2 tablets (8 mg total) by mouth daily before breakfast. 180 tablet 1  . metoprolol succinate (TOPROL-XL) 100 MG 24 hr tablet TAKE 1 TABLET EVERY MORNING 90 tablet 2  . ibuprofen (ADVIL,MOTRIN) 400 MG tablet Take 400 mg by mouth every 6 (six) hours as needed.    . lovastatin (MEVACOR) 20 MG tablet Take 1 tablet (20 mg total) by mouth once a week. 13 tablet 3   No current facility-administered medications for this visit.      Objective:  Ht 6\' 3"  (1.905 m)   Wt 270 lb (122.5 kg)   BMI 33.75 kg/m  self reported vitals Gen: NAD, resting comfortably Lungs: nonlabored, normal respiratory rate  Skin: appears dry, no obvious rash    Assessment and Plan   # Diabetes S: sounds poorly controlled on amaryl 4 mg. On metformin flatulence would cause him to pass stool. He did not start takign the insulin NPH- R  insulin.  CBGs- sugars around 150 right now Exercise and diet- misses his gym but getting out to the park and walking- is going to try to do some laps at the pool. Weight down a few lbs- he got as low as 260 and sugar got down to 100 but was dieting and exercising regularly.  Lab Results  Component Value Date   HGBA1C 11.4 (A) 01/20/2019   HGBA1C 9.4 (H) 09/20/2018   HGBA1C 9.8 (H) 06/18/2018   A/P: suspect poor control but does sound improved from last visit- we will increase  amaryl to 8 mg - he is opposed to even 250mg  metformin BID due to GI concerns -does not want to take insulin -refuses to come for labs now but will consider In 3 months (hes worried about covid 19)- he will call to set up  #hypertension S: hopefully controlled on  metoprolol 11923m 24 hour. Was controlled last visit BP Readings from Last 3 Encounters:  01/20/19 110/68  01/13/19 (!) 142/70  01/02/19 119/72  A/P: Hopefully controlled- advised purchase new home cuff and send us a reading after completed.   #hyperlipidemia/elevated LFTs S: Poorly controlled on no statin.  takes aspirin 81mg .  Lab Results  Component Value Date   CHOL 183 03/07/2016   HDL 34.90 (L) 03/07/2016   LDLCALC 120 (H) 11/10/2014   LDLDIRECT 104.0 08/09/2017   TRIG 133.0 11/10/2014   CHOLHDL 5 11/10/2014   A/P: History of hepatotoxicity related to statin drugs- continues to have mild elevation in LFTs even off statins-also has fatty liver on ultrasound in 2019 which likely contributes to ongoing issues Lab Results  Component Value Date   ALT 48 09/20/2018   AST 52 (H) 09/20/2018   ALKPHOS 54 09/20/2018   BILITOT 1.1 09/20/2018  We will trial low-dose lovastatin 20 mg once a week and monitor LFTs at follow-up in 3 months  #Health maintenance- declines colonoscopy for now due to COVID-19.  Had eye exam and we did not receive a copy- our team was to reach out and request again- I will set reminder for 2 weeks to see if this was completed  3 months - reach out to him  Future Appointments  Date Time Provider Department Center  08/05/2019  3:00 PM Marinus Mawaylor, Gregg W, MD [facility]   Return in about 3 months (around 09/10/2019) for follow up- or sooner if needed.  Lab/Order associations:   ICD-10-CM   1. Poorly controlled type 2 diabetes mellitus (HCC)  E11.65   2. Hyperlipidemia associated with type 2 diabetes mellitus (HCC)  E11.69    E78.5   3. Hypertension associated with diabetes (HCC)  E11.59     I10   4. Hepatotoxicity due to statin drug  K71.9    T46.6X5A    Meds ordered this encounter  Medications  . lovastatin (MEVACOR) 20 MG tablet    Sig: Take 1 tablet (20 mg total) by mouth once a week.    Dispense:  13 tablet    Refill:  3  . glimepiride (AMARYL) 4 MG tablet    Sig: Take 2 tablets (8 mg total) by mouth daily before breakfast.    Dispense:  180 tablet    Refill:  1   Return precautions advised.  Tana ConchStephen Rithvik Orcutt, MD

## 2019-06-10 NOTE — Patient Instructions (Addendum)
Health Maintenance Due  Topic Date Due  . COLONOSCOPY Pt wants to Postponed due to Covid-19 02/24/2017  . OPHTHALMOLOGY EXAM Pt stated he signed a ROI 3 months ago will check PPW 12/28/2017

## 2019-06-26 NOTE — Progress Notes (Signed)
Called and spoke with patient. No diabetic eye exam since 2018. Pt will contact [facility] to schedule annual eye exam and request that records are faxed to Dr. Yong Channel for review.

## 2019-07-24 ENCOUNTER — Telehealth: Payer: Self-pay

## 2019-07-24 NOTE — Telephone Encounter (Signed)
May write the following letter but also inform patient of- "I have asked patient to try to keep 9-12 feet from other people while exercising and also to wear a mask." I created smart phrase shymca and you and Theadora Rama should be able to use (great idea- thanks!).    To whom it may concern,   Paul Paul is a primary care patient of mine. Due to underlying medical condition, I have recommended regular exercise for patient. Please allow patient to exercise at your facility. I have asked patient to try to keep 9-12 feet from other people while exercising and also to wear a mask.   Thanks,  Garret Reddish

## 2019-07-24 NOTE — Telephone Encounter (Signed)
Patient notified of update  and verbalized understanding. Pt will stopped by today to pick up letter. No Covid Sx or contact. Pt asked to wear a mask.

## 2019-07-24 NOTE — Telephone Encounter (Signed)
Not a .phrase I apologize but a custom letter for the [facility]

## 2019-07-24 NOTE — Telephone Encounter (Signed)
Copied from [place] [id number]. Topic: General - Other >> Jul 24, 2019  9:12 AM Burchel, Abbi R wrote: Pt requesting note detailing medical need for use of [facility] facilities due to COVID restrictions.  Please call pt when completed: [phone]

## 2019-07-24 NOTE — Telephone Encounter (Signed)
Called Paul Paul to inform him that Dr. Yong Channel is out of the office until Monday. Paul Paul stated that he can wait and wanted to know if approved how long it would take. I informed Paul Paul that it could take up to 3 days just to allow [place] time to write letter. Paul Paul stated it was fine he can wait.   Okay for letter  Also noticed we get this question a lot okay to make a .phrase to help?

## 2019-07-24 NOTE — Telephone Encounter (Signed)
Noted Thanks you

## 2019-08-05 ENCOUNTER — Encounter

## 2019-08-05 ENCOUNTER — Ambulatory Visit (INDEPENDENT_AMBULATORY_CARE_PROVIDER_SITE_OTHER): Payer: Medicare HMO | Admitting: Internal Medicine

## 2019-08-05 ENCOUNTER — Encounter: Payer: Self-pay | Admitting: Internal Medicine

## 2019-08-05 ENCOUNTER — Other Ambulatory Visit: Payer: Self-pay

## 2019-08-05 VITALS — BP 152/82 | HR 79 | Ht 75.0 in | Wt 280.0 lb

## 2019-08-05 DIAGNOSIS — I495 Sick sinus syndrome: Secondary | ICD-10-CM | POA: Insufficient documentation

## 2019-08-05 DIAGNOSIS — Z950 Presence of cardiac pacemaker: Secondary | ICD-10-CM | POA: Diagnosis not present

## 2019-08-05 NOTE — Progress Notes (Signed)
HPI [name] returns today for followup. He is a pleasant 63yo man with symptomatic bradycardia, s/p PPM, HTN, obesity and a cerebral aneurysm, status post treatment. He denies chest pain or sob. He has been under some increased stress and gained back some of the weight he lost (20lbs).No syncope. Allergies  Allergen Reactions  . Losartan Potassium-Hctz Other (See Comments)    Caused dizziness & fatigue  . Prednisone Other (See Comments)    Raises blood sugar  . Doxycycline Rash and Nausea Only  . Lisinopril Rash     Current Outpatient Medications  Medication Sig Dispense Refill  . aspirin EC 81 MG tablet Take 81 mg by mouth daily.    Marland Kitchen. glimepiride (AMARYL) 4 MG tablet Take 2 tablets (8 mg total) by mouth daily before breakfast. 180 tablet 1  . ibuprofen (ADVIL,MOTRIN) 400 MG tablet Take 400 mg by mouth every 6 (six) hours as needed.    . lovastatin (MEVACOR) 20 MG tablet Take 1 tablet (20 mg total) by mouth once a week. 13 tablet 3  . metoprolol succinate (TOPROL-XL) 100 MG 24 hr tablet TAKE 1 TABLET EVERY MORNING 90 tablet 2   No current facility-administered medications for this visit.      Past Medical History:  Diagnosis Date  . Anxiety   . Arthritis   . Bell palsy    > 30 years. Left side of face droops a little  . Bradycardia   . Cerebral aneurysm   . Cerebral aneurysm without rupture 06/2011   Middle of Cerebral Artery  . Complication of anesthesia 2012   after surgery for cliping of cerebral anerysum- In the room, patient was shaking, and patient would awaken and then "go back out" shaking continued after he "went back out:  . Constipation   . Diabetes mellitus    now under control  . Dysrhythmia    BRADYCARDIA REQUIRING PACEMAKER .FOLLOWED BY    . HYPERTENSION 12/10/2007  . Pacemaker   . Shoulder pain    L SHOULDER. RECENT FALL  . Sleep apnea    CPAP.NOT WEARING NOW.NEEDS REPLACEMENT PIECE    ROS:   All systems reviewed and negative  except as noted in the HPI.   Past Surgical History:  Procedure Laterality Date  . CARDIAC CATHETERIZATION  07/06/11   normal  . CEREBRAL ANEURYSM REPAIR  06/2011   "stenting and coiling" per patient  . FRACTURE SURGERY Left    foot  . INSERT / REPLACE / REMOVE PACEMAKER  2012  . KNEE SURGERY Left    left; arthoscopic  . rotator cuff surgery Left 02/2012  . septal deviation    . SHOULDER ARTHROSCOPY WITH SUBACROMIAL DECOMPRESSION, ROTATOR CUFF REPAIR AND BICEP TENDON REPAIR Right 08/24/2015   Procedure: RIGHT SHOULDER DIAGNOSTIC OPERATIVE ARTHROSCOPY WITH DEBRIDEMENT, SUBACROMIAL DECOMPRESSION, MIMI-OPEN ROTATOR CUFF TEAR REPAIR AND POSSIBLE  BICEPS TENODESIS.  ;  Surgeon: Cammy CopaScott Gregory Dean, MD;  Location: [facility] OR;  Service: Orthopedics;  Laterality: Right;  RIGHT SHOULDER DIAGNOSTIC OPERATIVE ARTHROSCOPY, DEBRIDEMENT, SUBACROMIAL DECOMPRESSION, MINI-OPEN ROTATOR CUFF TEAR REPAIR, POSSIB     Family History  Problem Relation Age of Onset  . Heart attack Mother        died 6554, smoker  . Heart disease Mother   . Diabetes Mother   . Heart attack Father        died 5854, smoker  . Heart disease Father      Social History   Socioeconomic History  . Marital  status: Married    Spouse name: Not on file  . Number of children: Not on file  . Years of education: Not on file  . Highest education level: Not on file  Occupational History  . Not on file  Social Needs  . Financial resource strain: Not on file  . Food insecurity    Worry: Not on file    Inability: Not on file  . Transportation needs    Medical: Not on file    Non-medical: Not on file  Tobacco Use  . Smoking status: Former Smoker    Packs/day: 1.00    Years: 20.00    Pack years: 20.00    Types: Cigarettes    Quit date: 07/05/2011    Years since quitting: 8.0  . Smokeless tobacco: Never Used  Substance and Sexual Activity  . Alcohol use: No    Alcohol/week: 0.0 standard drinks  . Drug use: No  . Sexual activity: Not  on file  Lifestyle  . Physical activity    Days per week: Not on file    Minutes per session: Not on file  . Stress: Not on file  Relationships  . Social Herbalist on phone: Not on file    Gets together: Not on file    Attends religious service: Not on file    Active member of club or organization: Not on file    Attends meetings of clubs or organizations: Not on file    Relationship status: Not on file  . Intimate partner violence    Fear of current or ex partner: Not on file    Emotionally abused: Not on file    Physically abused: Not on file    Forced sexual activity: Not on file  Other Topics Concern  . Not on file  Social History Narrative   Married over 30 years in 2015. 1 step son. 1 grandson.       Disabled from brain aneurysm, pacemaker. Retired from [employer] years.       Hobbies: grandson, exercise at [facility]     BP (!) 152/82   Pulse 79   Ht 6\' 3"  (1.905 m)   Wt 280 lb (127 kg)   SpO2 96%   BMI 35.00 kg/m   Physical Exam:  Well appearing NAD overweight middle aged man HEENT: Unremarkable Neck:  6 cm JVD, no thyromegally Lymphatics:  No adenopathy Back:  No CVA tenderness Lungs:  Clear with no wheezes HEART:  Regular rate rhythm, no murmurs, no rubs, no clicks Abd:  soft, positive bowel sounds, no organomegally, no rebound, no guarding Ext:  2 plus pulses, no edema, no cyanosis, no clubbing Skin:  No rashes no nodules Neuro:  CN II through XII intact, motor grossly intact  DEVICE  Normal device function.  See PaceArt for details.   Assess/Plan: 1. Sinus node dysfunction - He is asymptomatic, s/p PPM insertion.  2. PPM - his [employer]. Jude DDD PM is working normally. He has about 5 years on the battery.  3. Obesity - he has gained 20 lbs since his last visit. He will start exercising. I asked him to lose 10 lbs.  4. HTN - his SBP is up. He does not check his bp at home. I asked him to start checking his bp at least once a week.  Mikle Bosworth.D.

## 2019-08-05 NOTE — Patient Instructions (Signed)
Medication Instructions:  Your physician recommends that you continue on your current medications as directed. Please refer to the Current Medication list given to you today.  Labwork: None ordered.  Testing/Procedures: None ordered.  Follow-Up:  Your physician wants you to follow-up in: 6 months with device clinic for a device check.  You will receive a reminder letter in the mail two months in advance. If you don't receive a letter, please call our office to schedule the follow-up appointment.  Your physician wants you to follow-up in: one year with Dr. Taylor.   You will receive a reminder letter in the mail two months in advance. If you don't receive a letter, please call our office to schedule the follow-up appointment.   Any Other Special Instructions Will Be Listed Below (If Applicable).  If you need a refill on your cardiac medications before your next appointment, please call your pharmacy.   

## 2019-09-24 ENCOUNTER — Other Ambulatory Visit: Payer: Self-pay | Admitting: Family Medicine

## 2019-10-02 ENCOUNTER — Ambulatory Visit (INDEPENDENT_AMBULATORY_CARE_PROVIDER_SITE_OTHER): Payer: Medicare HMO

## 2019-10-02 ENCOUNTER — Encounter: Payer: Self-pay | Admitting: Family Medicine

## 2019-10-02 ENCOUNTER — Other Ambulatory Visit: Payer: Self-pay

## 2019-10-02 ENCOUNTER — Ambulatory Visit (INDEPENDENT_AMBULATORY_CARE_PROVIDER_SITE_OTHER): Payer: Medicare HMO | Admitting: Family Medicine

## 2019-10-02 VITALS — BP 128/64 | Temp 98.4°F | Ht 75.0 in | Wt 275.2 lb

## 2019-10-02 DIAGNOSIS — E1159 Type 2 diabetes mellitus with other circulatory complications: Secondary | ICD-10-CM | POA: Diagnosis not present

## 2019-10-02 DIAGNOSIS — I10 Essential (primary) hypertension: Secondary | ICD-10-CM | POA: Diagnosis not present

## 2019-10-02 DIAGNOSIS — E1165 Type 2 diabetes mellitus with hyperglycemia: Secondary | ICD-10-CM | POA: Diagnosis not present

## 2019-10-02 DIAGNOSIS — E1169 Type 2 diabetes mellitus with other specified complication: Secondary | ICD-10-CM | POA: Diagnosis not present

## 2019-10-02 DIAGNOSIS — R7989 Other specified abnormal findings of blood chemistry: Secondary | ICD-10-CM | POA: Diagnosis not present

## 2019-10-02 DIAGNOSIS — Z23 Encounter for immunization: Secondary | ICD-10-CM | POA: Diagnosis not present

## 2019-10-02 DIAGNOSIS — Z0000 Encounter for general adult medical examination without abnormal findings: Secondary | ICD-10-CM

## 2019-10-02 DIAGNOSIS — E785 Hyperlipidemia, unspecified: Secondary | ICD-10-CM

## 2019-10-02 LAB — CBC WITH DIFFERENTIAL/PLATELET
Basophils Absolute: 0.1 10*3/uL (ref 0.0–0.1)
Basophils Relative: 1.9 % (ref 0.0–3.0)
Eosinophils Absolute: 0.3 10*3/uL (ref 0.0–0.7)
Eosinophils Relative: 3.9 % (ref 0.0–5.0)
HCT: 46.5 % (ref 39.0–52.0)
Hemoglobin: 16 g/dL (ref 13.0–17.0)
Lymphocytes Relative: 38 % (ref 12.0–46.0)
Lymphs Abs: 2.8 10*3/uL (ref 0.7–4.0)
MCHC: 34.4 g/dL (ref 30.0–36.0)
MCV: 93 fl (ref 78.0–100.0)
Monocytes Absolute: 0.7 10*3/uL (ref 0.1–1.0)
Monocytes Relative: 9.2 % (ref 3.0–12.0)
Neutro Abs: 3.4 10*3/uL (ref 1.4–7.7)
Neutrophils Relative %: 47 % (ref 43.0–77.0)
Platelets: 185 10*3/uL (ref 150.0–400.0)
RBC: 5.01 Mil/uL (ref 4.22–5.81)
RDW: 13.7 % (ref 11.5–15.5)
WBC: 7.3 10*3/uL (ref 4.0–10.5)

## 2019-10-02 LAB — LIPID PANEL
Cholesterol: 177 mg/dL (ref 0–200)
HDL: 38.4 mg/dL — ABNORMAL LOW (ref >39.00)
LDL Cholesterol: 109 mg/dL — ABNORMAL HIGH (ref 0–99)
NonHDL: 138.5
Total CHOL/HDL Ratio: 5
Triglycerides: 149 mg/dL (ref 0.0–149.0)
VLDL: 29.8 mg/dL (ref 0.0–40.0)

## 2019-10-02 LAB — COMPREHENSIVE METABOLIC PANEL
ALT: 43 U/L (ref 0–53)
AST: 47 U/L — ABNORMAL HIGH (ref 0–37)
Albumin: 3.8 g/dL (ref 3.5–5.2)
Alkaline Phosphatase: 62 U/L (ref 39–117)
BUN: 11 mg/dL (ref 6–23)
CO2: 26 mEq/L (ref 19–32)
Calcium: 9.5 mg/dL (ref 8.4–10.5)
Chloride: 102 mEq/L (ref 96–112)
Creatinine, Ser: 0.92 mg/dL (ref 0.40–1.50)
GFR: 82.7 mL/min (ref >60.00)
Glucose, Bld: 153 mg/dL — ABNORMAL HIGH (ref 70–99)
Potassium: 4.4 mEq/L (ref 3.5–5.1)
Sodium: 137 mEq/L (ref 135–145)
Total Bilirubin: 1.3 mg/dL — ABNORMAL HIGH (ref 0.2–1.2)
Total Protein: 7.6 g/dL (ref 6.0–8.3)

## 2019-10-02 LAB — HEMOGLOBIN A1C: Hgb A1c MFr Bld: 9.4 % — ABNORMAL HIGH (ref 4.6–6.5)

## 2019-10-02 MED ORDER — ROSUVASTATIN CALCIUM 5 MG PO TABS: 5.0000 mg | ORAL_TABLET | ORAL | 5 refills | Status: DC

## 2019-10-02 NOTE — Patient Instructions (Signed)
Paul Paul , Thank you for taking time to come for your Medicare Wellness Visit. I appreciate your ongoing commitment to your health goals. Please review the following plan we discussed and let me know if I can assist you in the future.   Screening recommendations/referrals: Colorectal Screening: recommended; please follow up with your GI doctor  Vision and Dental Exams: Recommended annual ophthalmology exams for early detection of glaucoma and other disorders of the eye Recommended annual dental exams for proper oral hygiene  Diabetic Exams: Diabetic Eye Exam: recommended yearly Diabetic Foot Exam: recommended   Vaccinations: Influenza vaccine: today Pneumococcal vaccine: up to date; last 04/23/18  Tdap vaccine: up to date; last 01/01/12  Shingles vaccine: Please call your insurance company to determine your out of pocket expense for the Shingrix vaccine. You may receive this vaccine at your local pharmacy.  Advanced directives: Please bring a copy of your POA (Power of Attorney) and/or Living Will to your next appointment.  Goals: Recommend to drink at least 6-8 8oz glasses of water per day and consume a balanced diet rich in fresh fruits and vegetables.   Next appointment: Please schedule your Annual Wellness Visit with your Nurse Health Advisor in one year.  Preventive Care 40-64 Years, Male Preventive care refers to lifestyle choices and visits with your health care provider that can promote health and wellness. What does preventive care include?  A yearly physical exam. This is also called an annual well check.  Dental exams once or twice a year.  Routine eye exams. Ask your health care provider how often you should have your eyes checked.  Personal lifestyle choices, including:  Daily care of your teeth and gums.  Regular physical activity.  Eating a healthy diet.  Avoiding tobacco and drug use.  Limiting alcohol use.  Practicing safe sex.  Taking low-dose  aspirin every day starting at age 58 if recommended by your health care provider.  Taking vitamin and mineral supplements as recommended by your health care provider. What happens during an annual well check? The services and screenings done by your health care provider during your annual well check will depend on your age, overall health, lifestyle risk factors, and family history of disease. Counseling  Your health care provider may ask you questions about your:  Alcohol use.  Tobacco use.  Drug use.  Emotional well-being.  Home and relationship well-being.  Sexual activity.  Eating habits.  Work and work Astronomer. Screening  You may have the following tests or measurements:  Height, weight, and BMI.  Blood pressure.  Lipid and cholesterol levels. These may be checked every 5 years, or more frequently if you are over 85 years old.  Skin check.  Lung cancer screening. You may have this screening every year starting at age 50 if you have a 30-pack-year history of smoking and currently smoke or have quit within the past 15 years.  Fecal occult blood test (FOBT) of the stool. You may have this test every year starting at age 2.  Flexible sigmoidoscopy or colonoscopy. You may have a sigmoidoscopy every 5 years or a colonoscopy every 10 years starting at age 42.  Prostate cancer screening. Recommendations will vary depending on your family history and other risks.  Hepatitis C blood test.  Hepatitis B blood test.  Sexually transmitted disease (STD) testing.  Diabetes screening. This is done by checking your blood sugar (glucose) after you have not eaten for a while (fasting). You may have this done every 1-3  years. Discuss your test results, treatment options, and if necessary, the need for more tests with your health care provider. Vaccines  Your health care provider may recommend certain vaccines, such as:  Influenza vaccine. This is recommended every year.   Tetanus, diphtheria, and acellular pertussis (Tdap, Td) vaccine. You may need a Td booster every 10 years.  Zoster vaccine. You may need this after age 58.  Pneumococcal 13-valent conjugate (PCV13) vaccine. You may need this if you have certain conditions and have not been vaccinated.  Pneumococcal polysaccharide (PPSV23) vaccine. You may need one or two doses if you smoke cigarettes or if you have certain conditions. Talk to your health care provider about which screenings and vaccines you need and how often you need them. This information is not intended to replace advice given to you by your health care provider. Make sure you discuss any questions you have with your health care provider. Document Released: 12/17/2015 Document Revised: 08/09/2016 Document Reviewed: 09/21/2015 Elsevier Interactive Patient Education  2017 [place] Prevention in the Home Falls can cause injuries. They can happen to people of all ages. There are many things you can do to make your home safe and to help prevent falls. What can I do on the outside of my home?  Regularly fix the edges of walkways and driveways and fix any cracks.  Remove anything that might make you trip as you walk through a door, such as a raised step or threshold.  Trim any bushes or trees on the path to your home.  Use bright outdoor lighting.  Clear any walking paths of anything that might make someone trip, such as rocks or tools.  Regularly check to see if handrails are loose or broken. Make sure that both sides of any steps have handrails.  Any raised decks and porches should have guardrails on the edges.  Have any leaves, snow, or ice cleared regularly.  Use sand or salt on walking paths during winter.  Clean up any spills in your garage right away. This includes oil or grease spills. What can I do in the bathroom?  Use night lights.  Install grab bars by the toilet and in the tub and shower. Do not use towel  bars as grab bars.  Use non-skid mats or decals in the tub or shower.  If you need to sit down in the shower, use a plastic, non-slip stool.  Keep the floor dry. Clean up any water that spills on the floor as soon as it happens.  Remove soap buildup in the tub or shower regularly.  Attach bath mats securely with double-sided non-slip rug tape.  Do not have throw rugs and other things on the floor that can make you trip. What can I do in the bedroom?  Use night lights.  Make sure that you have a light by your bed that is easy to reach.  Do not use any sheets or blankets that are too big for your bed. They should not hang down onto the floor.  Have a firm chair that has side arms. You can use this for support while you get dressed.  Do not have throw rugs and other things on the floor that can make you trip. What can I do in the kitchen?  Clean up any spills right away.  Avoid walking on wet floors.  Keep items that you use a lot in easy-to-reach places.  If you need to reach something above you, use a  strong step stool that has a grab bar.  Keep electrical cords out of the way.  Do not use floor polish or wax that makes floors slippery. If you must use wax, use non-skid floor wax.  Do not have throw rugs and other things on the floor that can make you trip. What can I do with my stairs?  Do not leave any items on the stairs.  Make sure that there are handrails on both sides of the stairs and use them. Fix handrails that are broken or loose. Make sure that handrails are as long as the stairways.  Check any carpeting to make sure that it is firmly attached to the stairs. Fix any carpet that is loose or worn.  Avoid having throw rugs at the top or bottom of the stairs. If you do have throw rugs, attach them to the floor with carpet tape.  Make sure that you have a light switch at the top of the stairs and the bottom of the stairs. If you do not have them, ask someone to  add them for you. What else can I do to help prevent falls?  Wear shoes that:  Do not have high heels.  Have rubber bottoms.  Are comfortable and fit you well.  Are closed at the toe. Do not wear sandals.  If you use a stepladder:  Make sure that it is fully opened. Do not climb a closed stepladder.  Make sure that both sides of the stepladder are locked into place.  Ask someone to hold it for you, if possible.  Clearly mark and make sure that you can see:  Any grab bars or handrails.  First and last steps.  Where the edge of each step is.  Use tools that help you move around (mobility aids) if they are needed. These include:  Canes.  Walkers.  Scooters.  Crutches.  Turn on the lights when you go into a dark area. Replace any light bulbs as soon as they burn out.  Set up your furniture so you have a clear path. Avoid moving your furniture around.  If any of your floors are uneven, fix them.  If there are any pets around you, be aware of where they are.  Review your medicines with your doctor. Some medicines can make you feel dizzy. This can increase your chance of falling. Ask your doctor what other things that you can do to help prevent falls. This information is not intended to replace advice given to you by your health care provider. Make sure you discuss any questions you have with your health care provider. Document Released: 09/16/2009 Document Revised: 04/27/2016 Document Reviewed: 12/25/2014 Elsevier Interactive Patient Education  2017 [employer].

## 2019-10-02 NOTE — Patient Instructions (Addendum)
Health Maintenance Due  Topic Date Due  . COLONOSCOPY does not want to do at this time - until gets cerebral aneurysm checked 02/24/2017  . OPHTHALMOLOGY EXAM will call to have done if eye issues - have them send [place] a copy 12/28/2017  . HEMOGLOBIN A1C ordered today  07/21/2019  . FOOT EXAM done today  09/19/2019  . URINE MICROALBUMIN ordered today  09/19/2019   Diabetes education-Get on youtube and access videos by Dr. Smiley Houseman (an endocrinologist. Listen to diabetes education parts 1-5)  Please stop by lab before you go If you do not have mychart- we will call you about results within 5 business days of [place] receiving them.  If you have mychart- we will send your results within 3 business days of [place] receiving them.  If abnormal or we want to clarify a result, we will call or mychart you to make sure you receive the message.  If you have questions or concerns or don't hear within 5-7 days, please send [place] a message or call us.

## 2019-10-02 NOTE — Progress Notes (Signed)
Phone [phone]  Subjective:  In person visit Paul Paul is a 63 y.o. year old very pleasant male patient who presents for/with See problem oriented charting Chief Complaint  Patient presents with  . Follow-up     ROS- Review of Systems  Constitutional: Negative.   HENT: Negative.   Eyes: Negative.   Respiratory: Negative.   Cardiovascular: Negative.   Gastrointestinal: Negative.   Genitourinary: Negative.   Musculoskeletal: Negative.   Skin: Negative.   Neurological: Negative.   Endo/Heme/Allergies: Negative.   Psychiatric/Behavioral: Negative.      Past Medical History-  Patient Active Problem List   Diagnosis Date Noted  . Cerebral aneurysm without rupture 01/01/2012    Priority: High  . Pacemaker 10/07/2011    Priority: High  . Poorly controlled type 2 diabetes mellitus ([place]) 11/09/2008    Priority: High  . Fatty liver 06/18/2018    Priority: Medium  . 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 Elevated LFTs 08/09/2017    Priority: Medium  . GAD (generalized anxiety disorder) 10/11/2016    Priority: Medium  . OSA on CPAP 09/24/2014    Priority: Medium  . Hyperlipidemia associated with type 2 diabetes mellitus ([place]) 09/24/2014    Priority: Medium  . Other specified cardiac dysrhythmias(427.89) 07/09/2012    Priority: Medium  . Hypertension associated with diabetes ([place]) 12/10/2007    Priority: Medium  . Obesity 01/01/2014    Priority: Low  . Sinus node dysfunction ([place]) 08/05/2019  . Chronic pruritic rash in adult 08/09/2017    Medications- reviewed and updated Current Outpatient Medications  Medication Sig Dispense Refill  . aspirin EC 81 MG tablet Take 81 mg by mouth daily.    Marland Kitchen glimepiride (AMARYL) 4 MG tablet Take 2 tablets (8 mg total) by mouth daily before breakfast. 180 tablet 1  . ibuprofen (ADVIL,MOTRIN) 400 MG tablet Take 400 mg by mouth every 6 (six) hours as needed.    . lovastatin (MEVACOR) 20 MG tablet  Take 1 tablet (20 mg total) by mouth once a week. 13 tablet 3  . metoprolol succinate (TOPROL-XL) 100 MG 24 hr tablet TAKE 1 TABLET EVERY MORNING 90 tablet 2   No current facility-administered medications for this visit.      Objective:  BP 128/64 (BP Location: Left Arm, Patient Position: Sitting, Cuff Size: Large)   Temp 98.4 F (36.9 C) (Temporal)   Ht 6\' 3"  (1.905 m)   Wt 275 lb 3.2 oz (124.8 kg)   SpO2 96%   BMI 34.40 kg/m  Gen: NAD, resting comfortably CV: RRR no murmurs rubs or gallops Lungs: CTAB no crackles, wheeze, rhonchi Abdomen: soft/nontender/nondistended. . No rebound or guarding.  Ext: trace edema on left- prior crush injury.  Skin: warm, dry Neuro: grossly normal, moves all extremities   Diabetic Foot Exam - Simple   Simple Foot Form Diabetic Foot exam was performed with the following findings: Yes 10/02/2019  2:13 PM  Visual Inspection No deformities, no ulcerations, no other skin breakdown bilaterally: Yes Sensation Testing Intact to touch and monofilament testing bilaterally: Yes Pulse Check Posterior Tibialis and Dorsalis pulse intact bilaterally: Yes Comments Did have some numbness on heel of left foot due to injury not new issue.        Assessment and Plan   %History of cerebral aneurysm without rupture. Follows with [facility] - in 2012 had 2 stents and coils.  - After seeing dr. Estanislado Pandy with IR will let me know  about colonoscopy (declines for now)- he states this has been pushed back this year due to covid 19- generally would see twice a year. Encouraged him to call to schedule follow up.   #Diabetes mellitus-poor control. Metformin intolerant- GI. Declines relion 01/20/2019 and 10/02/2019 S: compliant with 8mg  glimepiride daily. He has stopped the metformin and insulin. Does check blood sugars at home and average has been in 120's fasting.   Large swings in sugars in past as far as control with improved diet/exercise. Sugars in 200s today  but many mornings in the 120s and even gotten down to 110. Going to gym 5 days a week- swim 4 days, cardio 2 days, weights 4 days mixed in  Down 1 lb from last in person visit. Trying to do chicken fish. No sugar sweetened beverages including juice. unsweet tea if does tea. Water primary beverage. Declines diabetes educatoin Lab Results  Component Value Date   HGBA1C 11.4 (A) 01/20/2019  A/P: Last a1c was very high. He states meds are costly. He states will continue to work on diet and exercise but declines any medicine that is expensive and does not want to do injections.  - declines formal diabetes educatoin   #Hypertension S: Controlled on metoprolol 100 mg extended release BP Readings from Last 3 Encounters:  10/02/19 128/64  10/02/19 128/64  08/05/19 (!) 152/82  A/P: controlled continue current medicine  #Hyperlipidemia/elevated LFTs S: Patient has been off of statin due to elevated LFTs.  History of hepatotoxicity due to statins.   We tried lovastatin 20mg  once a week but had stomach upset on this.  A/P:  We opted today to try rosuvastatin 5 mg. I am not going to repeat cholesterol levels today as not currently on any medication. Will check liver.    #pacemaker due to history symptomatic bradycardia S: Patient follows with Dr. 10/05/19.  He has a pacemaker due to history of symptomatic bradycardia A/P: states just had checked a few weeks ago and doing well- denies any issues today. Continue cardiology follow up and pacemaker   Recommended follow up: 4 month a1c check . Had AWV today with nursing.   Lab/Order associations:   ICD-10-CM   1. Poorly controlled type 2 diabetes mellitus (HCC)  E11.65 Microalbumin / creatinine urine ratio    Hemoglobin A1c  2. Hypertension associated with diabetes (HCC)  E11.59    I10   3. Elevated LFTs  R79.89 Lipid panel  4. Hyperlipidemia associated with type 2 diabetes mellitus (HCC)  E11.69 Comprehensive metabolic panel   Ladona Ridgel CBC with  Differential/Platelet    No orders of the defined types were placed in this encounter.   Return precautions advised.  05-01-1993, MD

## 2019-10-02 NOTE — Progress Notes (Signed)
Subjective:   Paul BrookeCarl W Paul is a 63 y.o. male who presents for an Initial Medicare Annual Wellness Visit.  Review of Systems   Cardiac Risk Factors include: male gender;hypertension;diabetes mellitus;dyslipidemia;advanced age (>6055men, 45>65 women);obesity (BMI >30kg/m2)    Objective:    Today's Vitals   10/02/19 1359  BP: 128/64  Temp: 98.4 F (36.9 C)  TempSrc: Temporal  Weight: 275 lb 3.2 oz (124.8 kg)  Height: 6\' 3"  (1.905 m)   Body mass index is 34.4 kg/m.  Advanced Directives 10/02/2019 01/02/2019 02/13/2018 07/25/2013 11/29/2012 09/04/2012 08/22/2012  Does Patient Have a Medical Advance Directive? Yes No No Patient does not have advance directive Patient does not have advance directive Patient does not have advance directive Patient does not have advance directive;Patient would not like information  Type of Advance Directive Living will;Healthcare Power of Attorney - - - - - -  Does patient want to make changes to medical advance directive? No - Patient declined - - - - - -  Copy of Healthcare Power of Attorney in Chart? No - copy requested - - - - - -  Would patient like information on creating a medical advance directive? - No - Patient declined No - Patient declined - - - -  Pre-existing out of facility DNR order (yellow form or pink MOST form) - - - - - No -    Current Medications (verified) Outpatient Encounter Medications as of 10/02/2019  Medication Sig  . aspirin EC 81 MG tablet Take 81 mg by mouth daily.  Marland Kitchen. glimepiride (AMARYL) 4 MG tablet Take 2 tablets (8 mg total) by mouth daily before breakfast.  . metoprolol succinate (TOPROL-XL) 100 MG 24 hr tablet TAKE 1 TABLET EVERY MORNING  . ibuprofen (ADVIL,MOTRIN) 400 MG tablet Take 400 mg by mouth every 6 (six) hours as needed.  . lovastatin (MEVACOR) 20 MG tablet Take 1 tablet (20 mg total) by mouth once a week.   No facility-administered encounter medications on file as of 10/02/2019.     Allergies (verified)  Losartan potassium-hctz, Prednisone, Doxycycline, and Lisinopril   History: Past Medical History:  Diagnosis Date  . Anxiety   . Arthritis   . Bell palsy    > 30 years. Left side of face droops a little  . Bradycardia   . Cerebral aneurysm   . Cerebral aneurysm without rupture 06/2011   Middle of Cerebral Artery  . Complication of anesthesia 2012   after surgery for cliping of cerebral anerysum- In the room, patient was shaking, and patient would awaken and then "go back out" shaking continued after he "went back out:  . Constipation   . Diabetes mellitus    now under control  . Dysrhythmia    BRADYCARDIA REQUIRING PACEMAKER .FOLLOWED BY GREGG TAYLOR  . HYPERTENSION 12/10/2007  . Pacemaker   . Shoulder pain    L SHOULDER. RECENT FALL  . Sleep apnea    CPAP.NOT WEARING NOW.NEEDS REPLACEMENT PIECE   Past Surgical History:  Procedure Laterality Date  . CARDIAC CATHETERIZATION  07/06/11   normal  . CEREBRAL ANEURYSM REPAIR  06/2011   "stenting and coiling" per patient  . FRACTURE SURGERY Left    foot  . INSERT / REPLACE / REMOVE PACEMAKER  2012  . KNEE SURGERY Left    left; arthoscopic  . rotator cuff surgery Left 02/2012  . septal deviation    . SHOULDER ARTHROSCOPY WITH SUBACROMIAL DECOMPRESSION, ROTATOR CUFF REPAIR AND BICEP TENDON REPAIR Right 08/24/2015  Procedure: RIGHT SHOULDER DIAGNOSTIC OPERATIVE ARTHROSCOPY WITH DEBRIDEMENT, SUBACROMIAL DECOMPRESSION, MIMI-OPEN ROTATOR CUFF TEAR REPAIR AND POSSIBLE  BICEPS TENODESIS.  ;  Surgeon: Meredith Pel, MD;  Location: [facility];  Service: Orthopedics;  Laterality: Right;  RIGHT SHOULDER DIAGNOSTIC OPERATIVE ARTHROSCOPY, DEBRIDEMENT, SUBACROMIAL DECOMPRESSION, MINI-OPEN ROTATOR CUFF TEAR REPAIR, POSSIB   Family History  Problem Relation Age of Onset  . Heart attack Mother        died 80, smoker  . Heart disease Mother   . Diabetes Mother   . Heart attack Father        died 55, smoker  . Heart disease Father    Social  History   Socioeconomic History  . Marital status: Married    Spouse name: Not on file  . Number of children: Not on file  . Years of education: Not on file  . Highest education level: Not on file  Occupational History  . Not on file  Social Needs  . Financial resource strain: Not on file  . Food insecurity    Worry: Not on file    Inability: Not on file  . Transportation needs    Medical: Not on file    Non-medical: Not on file  Tobacco Use  . Smoking status: Former Smoker    Packs/day: 1.00    Years: 20.00    Pack years: 20.00    Types: Cigarettes    Quit date: 07/05/2011    Years since quitting: 8.2  . Smokeless tobacco: Never Used  Substance and Sexual Activity  . Alcohol use: No    Alcohol/week: 0.0 standard drinks  . Drug use: No  . Sexual activity: Not on file  Lifestyle  . Physical activity    Days per week: Not on file    Minutes per session: Not on file  . Stress: Not on file  Relationships  . Social Herbalist on phone: Not on file    Gets together: Not on file    Attends religious service: Not on file    Active member of club or organization: Not on file    Attends meetings of clubs or organizations: Not on file    Relationship status: Not on file  Other Topics Concern  . Not on file  Social History Narrative   Married over 30 years in 2015. 1 step son. 1 grandson.       Disabled from brain aneurysm, pacemaker. Retired from [employer] years.       Hobbies: grandson, exercise at [place] given: Not Answered   Clinical Intake:  Pre-visit preparation completed: Yes  Pain : No/denies pain  Diabetes: Yes(labs today) CBG done?: No Did pt. bring in CBG monitor from home?: No  How often do you need to have someone help you when you read instructions, pamphlets, or other written materials from your doctor or pharmacy?: 1 - Never  Interpreter Needed?: No  Information entered by :: Denman George LPN   Activities of Daily Living In your present state of health, do you have any difficulty performing the following activities: 10/02/2019 10/02/2019  Hearing? N N  Vision? N N  Difficulty concentrating or making decisions? N N  Walking or climbing stairs? N N  Dressing or bathing? N N  Doing errands, shopping? N N  Preparing Food and eating ? - N  Using the Toilet? - N  In the past six months, have you accidently leaked urine? - N  Do you have problems with loss of bowel control? - N  Managing your Medications? - N  Managing your Finances? - N  Housekeeping or managing your Housekeeping? - N  Some recent data might be hidden     Immunizations and Health Maintenance Immunization History  Administered Date(s) Administered  . Fluad Quad(high Dose 65+) 10/02/2019  . Influenza Split 11/06/2011, 09/05/2012  . Influenza,inj,Quad PF,6+ Mos 09/24/2014, 09/18/2018  . Pneumococcal Conjugate-13 04/23/2018  . Pneumococcal Polysaccharide-23 07/26/2012  . Tdap 01/01/2012   Health Maintenance Due  Topic Date Due  . COLONOSCOPY  02/24/2017  . OPHTHALMOLOGY EXAM  12/28/2017  . HEMOGLOBIN A1C  07/21/2019  . FOOT EXAM  09/19/2019  . URINE MICROALBUMIN  09/19/2019    Patient Care Team: Shelva Majestic, MD as PCP - General (Family Medicine) Jethro Bolus, MD as Consulting Physician (Ophthalmology) Julieanne Cotton, MD as Consulting Physician (Interventional Radiology) Marinus Maw, MD as Consulting Physician (Cardiology)  Indicate any recent Medical Services you may have received from other than [facility] providers in the past year (date may be approximate).    Assessment:   This is a routine wellness examination for Paul Paul.  Hearing/Vision screen No exam data present  Dietary issues and exercise activities discussed: Current Exercise Habits: The patient does not participate in regular exercise at present;Home exercise routine(does complete yardwork), Type of exercise: walking  Goals    None    Depression Screen PHQ 2/9 Scores 10/02/2019 06/10/2019 09/18/2018 07/12/2017  PHQ - 2 Score 0 0 0 0    Fall Risk Fall Risk  10/02/2019 09/18/2018 07/12/2017  Falls in the past year? 0 No No  Injury with Fall? 0 - -  Follow up Falls evaluation completed;Education provided;Falls prevention discussed - -    Is the patient's home free of loose throw rugs in walkways, pet beds, electrical cords, etc?   yes      Grab bars in the bathroom? yes      Handrails on the stairs?   yes      Adequate lighting?   yes  Timed Get Up and Go performed: completed and within normal timeframe; no gait abnormalities noted   Cognitive Function: no cognitive concerns at this time  Cognitive Testing  Alert? Yes         Normal Appearance? Yes  Oriented to person? Yes           Place? Yes  Time? Yes  Recall of three objects? Yes  Can perform simple calculations? Yes  Displays appropriate judgment? Yes  Can read the correct time from a watch face? Yes   Screening Tests Health Maintenance  Topic Date Due  . COLONOSCOPY  02/24/2017  . OPHTHALMOLOGY EXAM  12/28/2017  . HEMOGLOBIN A1C  07/21/2019  . FOOT EXAM  09/19/2019  . URINE MICROALBUMIN  09/19/2019  . TETANUS/TDAP  12/31/2021  . INFLUENZA VACCINE  Completed  . Hepatitis C Screening  Completed  . HIV Screening  Completed    Qualifies for Shingles Vaccine? Discussed and patient will check with pharmacy for coverage.  Patient education handout provided   Cancer Screenings: Lung: Low Dose CT Chest recommended if Age 14-80 years, 30 pack-year currently smoking OR have quit w/in 15years. Patient does not qualify. Colorectal: recommended; patient states that he plans to contact GI doctor     Plan:  I have personally reviewed and addressed the Medicare Annual Wellness questionnaire and have noted the following in the patient's chart:  A. Medical and social history  B. Use of alcohol, tobacco or illicit drugs  C. Current medications and  supplements D. Functional ability and status E.  Nutritional status F.  Physical activity G. Advance directives H. List of other physicians I.  Hospitalizations, surgeries, and ER visits in previous 12 months J.  Vitals K. Screenings such as hearing and vision if needed, cognitive and depression L. Referrals, records requested, and appointments- will request last diabetic eye exam notes   In addition, I have reviewed and discussed with patient certain preventive protocols, quality metrics, and best practice recommendations. A written personalized care plan for preventive services as well as general preventive health recommendations were provided to patient.   Signed,  Kandis Fantasia, LPN  Nurse Health Advisor    Nurse Notes: no additional

## 2019-10-02 NOTE — Progress Notes (Signed)
I have reviewed and agree with note, evaluation, plan.  See my separate note from today  Brelyn Woehl, MD  

## 2019-10-03 LAB — MICROALBUMIN / CREATININE URINE RATIO
Creatinine,U: 129.4 mg/dL
Microalb Creat Ratio: 0.5 mg/g (ref 0.0–30.0)
Microalb, Ur: <0.7 mg/dL (ref 0.0–1.9)

## 2019-12-15 LAB — CUP PACEART INCLINIC DEVICE CHECK
Date Time Interrogation Session: 20200901165311
Implantable Lead Implant Date: 20120801
Implantable Lead Implant Date: 20120801
Implantable Lead Location: 753859
Implantable Lead Location: 753860
Implantable Pulse Generator Implant Date: 20120801
Pulse Gen Model: 2210
Pulse Gen Serial Number: 7250770

## 2019-12-18 ENCOUNTER — Other Ambulatory Visit: Payer: Self-pay | Admitting: Family Medicine

## 2020-02-10 ENCOUNTER — Other Ambulatory Visit: Payer: Self-pay

## 2020-02-10 ENCOUNTER — Ambulatory Visit (INDEPENDENT_AMBULATORY_CARE_PROVIDER_SITE_OTHER): Payer: Medicare HMO | Admitting: *Deleted

## 2020-02-10 DIAGNOSIS — I495 Sick sinus syndrome: Secondary | ICD-10-CM | POA: Diagnosis not present

## 2020-02-10 DIAGNOSIS — Z950 Presence of cardiac pacemaker: Secondary | ICD-10-CM

## 2020-02-10 LAB — CUP PACEART INCLINIC DEVICE CHECK
Battery Remaining Longevity: 52 mo
Battery Voltage: 2.84 V
Brady Statistic RA Percent Paced: 73 %
Brady Statistic RV Percent Paced: 0.06 %
Date Time Interrogation Session: 20210309160500
Implantable Lead Implant Date: 20120801
Implantable Lead Implant Date: 20120801
Implantable Lead Location: 753859
Implantable Lead Location: 753860
Implantable Pulse Generator Implant Date: 20120801
Lead Channel Impedance Value: 525 Ohm
Lead Channel Impedance Value: 550 Ohm
Lead Channel Pacing Threshold Amplitude: 0.75 V
Lead Channel Pacing Threshold Amplitude: 1 V
Lead Channel Pacing Threshold Pulse Width: 0.4 ms
Lead Channel Pacing Threshold Pulse Width: 0.4 ms
Lead Channel Sensing Intrinsic Amplitude: 10.5 mV
Lead Channel Sensing Intrinsic Amplitude: 5 mV
Lead Channel Setting Pacing Amplitude: 1.25 V
Lead Channel Setting Pacing Amplitude: 2 V
Lead Channel Setting Pacing Pulse Width: 0.4 ms
Lead Channel Setting Sensing Sensitivity: 2 mV
Pulse Gen Model: 2210
Pulse Gen Serial Number: 7250770

## 2020-02-10 NOTE — Progress Notes (Signed)
Pacemaker check in clinic. Normal device function. Thresholds, sensing, impedances consistent with previous measurements. Device programmed to maximize longevity. No mode switch or high ventricular rates noted. Device programmed at appropriate safety margins. Histogram distribution appropriate for patient activity level. Device programmed to optimize intrinsic conduction. Estimated longevity 4 yrs  4 months. Patient declines remote follow ups and device will checked in 6 months at his yearly follow up in September , 2021 with Dr Ladona Ridgel. Patient education completed.

## 2020-02-10 NOTE — Patient Instructions (Signed)
Follow up with Dr Ladona Ridgel I  September , 2021. Call to schedule an appointment in August with Dr Ladona Ridgel.

## 2020-02-19 ENCOUNTER — Telehealth: Payer: Self-pay

## 2020-02-19 NOTE — Telephone Encounter (Signed)
Patient would like to know would it be safe to take the covid vaccine with his health condition. Please follow up with patient

## 2020-02-19 NOTE — Telephone Encounter (Signed)
Any reason why not to get?

## 2020-02-20 NOTE — Telephone Encounter (Signed)
Yes I think he is a good candidate to get the vaccination

## 2020-02-20 NOTE — Telephone Encounter (Signed)
Called and let patient know. Will call if any questions.

## 2020-04-14 ENCOUNTER — Other Ambulatory Visit: Payer: Self-pay | Admitting: Family Medicine

## 2020-04-15 ENCOUNTER — Telehealth: Payer: Self-pay

## 2020-04-15 NOTE — Telephone Encounter (Signed)
Last OV 10/02/19 Last refill 09/25/19 #90/2 Next OV not scheduled

## 2020-04-15 NOTE — Telephone Encounter (Signed)
Yes thanks- im accepting family members of current patients  

## 2020-04-15 NOTE — Telephone Encounter (Signed)
Patient would like to know would Dr. Durene Cal accept his son Paul Paul as a new patient. Paul Paul best contact new number is [phone].

## 2020-04-15 NOTE — Telephone Encounter (Signed)
See below

## 2020-05-26 ENCOUNTER — Other Ambulatory Visit: Payer: Self-pay | Admitting: Family Medicine

## 2020-07-20 ENCOUNTER — Other Ambulatory Visit: Payer: Self-pay

## 2020-07-20 ENCOUNTER — Emergency Department (HOSPITAL_COMMUNITY): Payer: Medicare HMO

## 2020-07-20 ENCOUNTER — Encounter (HOSPITAL_COMMUNITY): Payer: Self-pay | Admitting: Emergency Medicine

## 2020-07-20 ENCOUNTER — Telehealth: Payer: Self-pay | Admitting: Internal Medicine

## 2020-07-20 ENCOUNTER — Inpatient Hospital Stay (HOSPITAL_COMMUNITY)
Admission: EM | Admit: 2020-07-20 | Discharge: 2020-07-22 | DRG: 247 | Disposition: A | Discharge status: Home / Self Care | Payer: Medicare HMO | Attending: Internal Medicine | Admitting: Internal Medicine

## 2020-07-20 DIAGNOSIS — R0902 Hypoxemia: Secondary | ICD-10-CM | POA: Diagnosis not present

## 2020-07-20 DIAGNOSIS — G4733 Obstructive sleep apnea (adult) (pediatric): Secondary | ICD-10-CM | POA: Diagnosis not present

## 2020-07-20 DIAGNOSIS — Z950 Presence of cardiac pacemaker: Secondary | ICD-10-CM | POA: Diagnosis not present

## 2020-07-20 DIAGNOSIS — E6609 Other obesity due to excess calories: Secondary | ICD-10-CM | POA: Diagnosis not present

## 2020-07-20 DIAGNOSIS — F419 Anxiety disorder, unspecified: Secondary | ICD-10-CM | POA: Diagnosis present

## 2020-07-20 DIAGNOSIS — I499 Cardiac arrhythmia, unspecified: Secondary | ICD-10-CM | POA: Diagnosis not present

## 2020-07-20 DIAGNOSIS — I25119 Atherosclerotic heart disease of native coronary artery with unspecified angina pectoris: Secondary | ICD-10-CM | POA: Diagnosis present

## 2020-07-20 DIAGNOSIS — I249 Acute ischemic heart disease, unspecified: Secondary | ICD-10-CM | POA: Diagnosis present

## 2020-07-20 DIAGNOSIS — Z87891 Personal history of nicotine dependence: Secondary | ICD-10-CM

## 2020-07-20 DIAGNOSIS — I10 Essential (primary) hypertension: Secondary | ICD-10-CM | POA: Diagnosis not present

## 2020-07-20 DIAGNOSIS — R079 Chest pain, unspecified: Secondary | ICD-10-CM | POA: Diagnosis not present

## 2020-07-20 DIAGNOSIS — G510 Bell's palsy: Secondary | ICD-10-CM | POA: Diagnosis present

## 2020-07-20 DIAGNOSIS — Z79899 Other long term (current) drug therapy: Secondary | ICD-10-CM

## 2020-07-20 DIAGNOSIS — Z833 Family history of diabetes mellitus: Secondary | ICD-10-CM | POA: Diagnosis not present

## 2020-07-20 DIAGNOSIS — I214 Non-ST elevation (NSTEMI) myocardial infarction: Principal | ICD-10-CM | POA: Diagnosis present

## 2020-07-20 DIAGNOSIS — J90 Pleural effusion, not elsewhere classified: Secondary | ICD-10-CM | POA: Diagnosis not present

## 2020-07-20 DIAGNOSIS — I160 Hypertensive urgency: Secondary | ICD-10-CM | POA: Diagnosis present

## 2020-07-20 DIAGNOSIS — I495 Sick sinus syndrome: Secondary | ICD-10-CM | POA: Diagnosis not present

## 2020-07-20 DIAGNOSIS — E119 Type 2 diabetes mellitus without complications: Secondary | ICD-10-CM | POA: Diagnosis present

## 2020-07-20 DIAGNOSIS — E1159 Type 2 diabetes mellitus with other circulatory complications: Secondary | ICD-10-CM

## 2020-07-20 DIAGNOSIS — I493 Ventricular premature depolarization: Secondary | ICD-10-CM | POA: Diagnosis present

## 2020-07-20 DIAGNOSIS — Z8679 Personal history of other diseases of the circulatory system: Secondary | ICD-10-CM

## 2020-07-20 DIAGNOSIS — Z9989 Dependence on other enabling machines and devices: Secondary | ICD-10-CM

## 2020-07-20 DIAGNOSIS — E1165 Type 2 diabetes mellitus with hyperglycemia: Secondary | ICD-10-CM | POA: Diagnosis present

## 2020-07-20 DIAGNOSIS — I2510 Atherosclerotic heart disease of native coronary artery without angina pectoris: Secondary | ICD-10-CM | POA: Diagnosis not present

## 2020-07-20 DIAGNOSIS — E785 Hyperlipidemia, unspecified: Secondary | ICD-10-CM | POA: Diagnosis not present

## 2020-07-20 DIAGNOSIS — R0789 Other chest pain: Secondary | ICD-10-CM | POA: Diagnosis not present

## 2020-07-20 DIAGNOSIS — Z888 Allergy status to other drugs, medicaments and biological substances status: Secondary | ICD-10-CM

## 2020-07-20 DIAGNOSIS — E871 Hypo-osmolality and hyponatremia: Secondary | ICD-10-CM | POA: Diagnosis present

## 2020-07-20 DIAGNOSIS — Z7982 Long term (current) use of aspirin: Secondary | ICD-10-CM

## 2020-07-20 DIAGNOSIS — Z6834 Body mass index (BMI) 34.0-34.9, adult: Secondary | ICD-10-CM | POA: Diagnosis not present

## 2020-07-20 DIAGNOSIS — E1169 Type 2 diabetes mellitus with other specified complication: Secondary | ICD-10-CM | POA: Diagnosis present

## 2020-07-20 DIAGNOSIS — Z7984 Long term (current) use of oral hypoglycemic drugs: Secondary | ICD-10-CM

## 2020-07-20 DIAGNOSIS — I152 Hypertension secondary to endocrine disorders: Secondary | ICD-10-CM | POA: Diagnosis present

## 2020-07-20 DIAGNOSIS — Z20822 Contact with and (suspected) exposure to covid-19: Secondary | ICD-10-CM | POA: Diagnosis present

## 2020-07-20 DIAGNOSIS — M1990 Unspecified osteoarthritis, unspecified site: Secondary | ICD-10-CM | POA: Diagnosis present

## 2020-07-20 DIAGNOSIS — F411 Generalized anxiety disorder: Secondary | ICD-10-CM | POA: Diagnosis present

## 2020-07-20 DIAGNOSIS — Z881 Allergy status to other antibiotic agents status: Secondary | ICD-10-CM

## 2020-07-20 DIAGNOSIS — I440 Atrioventricular block, first degree: Secondary | ICD-10-CM | POA: Diagnosis present

## 2020-07-20 DIAGNOSIS — D696 Thrombocytopenia, unspecified: Secondary | ICD-10-CM | POA: Diagnosis present

## 2020-07-20 DIAGNOSIS — Z8249 Family history of ischemic heart disease and other diseases of the circulatory system: Secondary | ICD-10-CM

## 2020-07-20 DIAGNOSIS — E669 Obesity, unspecified: Secondary | ICD-10-CM | POA: Diagnosis present

## 2020-07-20 LAB — BASIC METABOLIC PANEL
Anion gap: 9 (ref 5–15)
BUN: 11 mg/dL (ref 8–23)
CO2: 23 mmol/L (ref 22–32)
Calcium: 9.6 mg/dL (ref 8.9–10.3)
Chloride: 98 mmol/L (ref 98–111)
Creatinine, Ser: 1.06 mg/dL (ref 0.61–1.24)
GFR calc Af Amer: >60 mL/min (ref >60)
GFR calc non Af Amer: >60 mL/min (ref >60)
Glucose, Bld: 399 mg/dL — ABNORMAL HIGH (ref 70–99)
Potassium: 4.2 mmol/L (ref 3.5–5.1)
Sodium: 130 mmol/L — ABNORMAL LOW (ref 135–145)

## 2020-07-20 LAB — CBC
HCT: 47.5 % (ref 39.0–52.0)
Hemoglobin: 15.9 g/dL (ref 13.0–17.0)
MCH: 30.1 pg (ref 26.0–34.0)
MCHC: 33.5 g/dL (ref 30.0–36.0)
MCV: 89.8 fL (ref 80.0–100.0)
Platelets: 170 10*3/uL (ref 150–400)
RBC: 5.29 MIL/uL (ref 4.22–5.81)
RDW: 12.8 % (ref 11.5–15.5)
WBC: 5.8 10*3/uL (ref 4.0–10.5)
nRBC: 0 % (ref 0.0–0.2)

## 2020-07-20 LAB — TROPONIN I (HIGH SENSITIVITY)
Troponin I (High Sensitivity): 34 ng/L — ABNORMAL HIGH (ref <18)
Troponin I (High Sensitivity): 258 ng/L (ref <18)
Troponin I (High Sensitivity): 359 ng/L (ref <18)

## 2020-07-20 MED ORDER — ATORVASTATIN CALCIUM 80 MG PO TABS
80.0000 mg | ORAL_TABLET | Freq: Every day | ORAL | Status: DC
  Administered 2020-07-21 – 2020-07-22 (×2): 80 mg via ORAL
  Filled 2020-07-20 (×2): qty 1

## 2020-07-20 MED ORDER — ASPIRIN 325 MG PO TABS: 325.0000 mg | ORAL_TABLET | Freq: Once | ORAL | Status: DC

## 2020-07-20 MED ORDER — HEPARIN (PORCINE) 25000 UT/250ML-% IV SOLN
1600.0000 [IU]/h | INTRAVENOUS | Status: DC
  Administered 2020-07-20: 1400 [IU]/h via INTRAVENOUS
  Filled 2020-07-20 (×2): qty 250

## 2020-07-20 MED ORDER — ASPIRIN EC 81 MG PO TBEC
81.0000 mg | DELAYED_RELEASE_TABLET | Freq: Every day | ORAL | Status: DC
  Administered 2020-07-21: 81 mg via ORAL
  Filled 2020-07-20: qty 1

## 2020-07-20 MED ORDER — HEPARIN BOLUS VIA INFUSION
4000.0000 [IU] | Freq: Once | INTRAVENOUS | Status: AC
  Administered 2020-07-20: 4000 [IU] via INTRAVENOUS
  Filled 2020-07-20: qty 4000

## 2020-07-20 NOTE — ED Triage Notes (Signed)
Patient arrives to ED with [facility] to triage with complaints of sudden sharp generalized chest pain that started this morning while doing yoga. Pt took 324 ASA which helped pain decrease to 3/10. Pt states the pain radiated to both arms which caused them to be sore. Pt denies SOB & N/V.

## 2020-07-20 NOTE — Progress Notes (Signed)
ANTICOAGULATION CONSULT NOTE - Initial Consult  Pharmacy Consult for heparin Indication: chest pain/ACS  Allergies  Allergen Reactions  . Losartan Potassium-Hctz Other (See Comments)    Caused dizziness & fatigue  . Prednisone Other (See Comments)    Raises blood sugar  . Doxycycline Rash and Nausea Only  . Lisinopril Rash    Patient Measurements: Height: 6\' 3"  (190.5 cm) Weight: 124.7 kg (275 lb) IBW/kg (Calculated) : 84.5 Heparin Dosing Weight: 111.4kg  Vital Signs: Temp: 98.4 F (36.9 C) (08/17 1402) Temp Source: Oral (08/17 1402) BP: 142/80 (08/17 1402) Pulse Rate: 60 (08/17 1402)  Labs: Recent Labs    07/20/20 1142 07/20/20 1538  HGB 15.9  --   HCT 47.5  --   PLT 170  --   CREATININE 1.06  --   TROPONINIHS 34* 258*    Estimated Creatinine Clearance: 98.9 mL/min (by C-G formula based on SCr of 1.06 mg/dL).   Medical History: Past Medical History:  Diagnosis Date  . Anxiety   . Arthritis   . Bell palsy    > 30 years. Left side of face droops a little  . Bradycardia   . Cerebral aneurysm   . Cerebral aneurysm without rupture 06/2011   Middle of Cerebral Artery  . Complication of anesthesia 2012   after surgery for cliping of cerebral anerysum- In the room, patient was shaking, and patient would awaken and then "go back out" shaking continued after he "went back out:  . Constipation   . Diabetes mellitus    now under control  . Dysrhythmia    BRADYCARDIA REQUIRING PACEMAKER .FOLLOWED BY GREGG TAYLOR  . HYPERTENSION 12/10/2007  . Pacemaker   . Shoulder pain    L SHOULDER. RECENT FALL  . Sleep apnea    CPAP.NOT WEARING NOW.NEEDS REPLACEMENT PIECE    Medications:  Infusions:  . heparin      Assessment: 64 yom presented to the ED with CP. Troponin elevated and now starting IV heparin. Baseline CBC is WNL and he is not on anticoagulation PTA.   Goal of Therapy:  Heparin level 0.3-0.7 units/ml Monitor platelets by anticoagulation protocol: Yes    Plan:  Heparin bolus 4000 units IV x 1 Heparin gtt 1400 units/hr Check a 6 hr heparin Daily heparin level and CBC  Merial Moritz, 02/07/2008 07/20/2020,8:24 PM

## 2020-07-20 NOTE — ED Provider Notes (Signed)
[facility]ENC[facility] Provider Note   CSN: [id number] Arrival date & time: 07/20/20  1132     History Chief Complaint  Patient presents with  . Chest Pain    Paul Paul is a 64 y.o. male with pertinent past medical history of hypertension, obesity, diabetes, pacemaker (Saint Jude DDD status post sinus node dysfunction, cerebral aneurysm without rupture, OSA on CPAP that presents the emergency department today for chest pain.  Patient states that he was in a yoga class this morning at 1030 when he started having extreme chest pain in the center of his chest rates it a 10/10 states that he felt like there was an elephant sitting on his chest.  States that he has never felt anything like this before.  States that he took 324 of aspirin which did help reduce the pain.  States that since he has been here his pain has reduced to a 3/10.  Denies any shortness of breath, nausea, vomiting, diaphoresis.  Denied all of this during the event as well.  Denies any radiation down into his arm, neck, jaw, back.  States that he takes his medications as prescribed. Does have a cardiologist, Dr. Ladona Ridgelaylor who he sees regularly for his pacemaker.  Has had his Covid vaccines.  Denies any URI-like symptoms, fevers, chills.  States that he was generally healthy, in normal health before this.  HPI     Past Medical History:  Diagnosis Date  . Anxiety   . Arthritis   . Bell palsy    > 30 years. Left side of face droops a little  . Bradycardia   . Cerebral aneurysm   . Cerebral aneurysm without rupture 06/2011   Middle of Cerebral Artery  . Complication of anesthesia 2012   after surgery for cliping of cerebral anerysum- In the room, patient was shaking, and patient would awaken and then "go back out" shaking continued after he "went back out:  . Constipation   . Diabetes mellitus    now under control  . Dysrhythmia    BRADYCARDIA REQUIRING PACEMAKER .FOLLOWED BY GREGG TAYLOR  .  HYPERTENSION 12/10/2007  . Pacemaker   . Shoulder pain    L SHOULDER. RECENT FALL  . Sleep apnea    CPAP.NOT WEARING NOW.NEEDS REPLACEMENT PIECE    Patient Active Problem List   Diagnosis Date Noted  . Sinus node dysfunction (HCC) 08/05/2019  . Fatty liver 06/18/2018  . Hepatotoxicity due to statin drug 10/19/2017  . Elevated LFTs 08/09/2017  . Chronic pruritic rash in adult 08/09/2017  . GAD (generalized anxiety disorder) 10/11/2016  . OSA on CPAP 09/24/2014  . Hyperlipidemia associated with type 2 diabetes mellitus (HCC) 09/24/2014  . Obesity 01/01/2014  . Other specified cardiac dysrhythmias(427.89) 07/09/2012  . Cerebral aneurysm without rupture 01/01/2012  . Pacemaker 10/07/2011  . Poorly controlled type 2 diabetes mellitus (HCC) 11/09/2008  . Hypertension associated with diabetes (HCC) 12/10/2007    Past Surgical History:  Procedure Laterality Date  . CARDIAC CATHETERIZATION  07/06/11   normal  . CEREBRAL ANEURYSM REPAIR  06/2011   "stenting and coiling" per patient  . FRACTURE SURGERY Left    foot  . INSERT / REPLACE / REMOVE PACEMAKER  2012  . KNEE SURGERY Left    left; arthoscopic  . rotator cuff surgery Left 02/2012  . septal deviation    . SHOULDER ARTHROSCOPY WITH SUBACROMIAL DECOMPRESSION, ROTATOR CUFF REPAIR AND BICEP TENDON REPAIR Right 08/24/2015   Procedure: RIGHT SHOULDER  DIAGNOSTIC OPERATIVE ARTHROSCOPY WITH DEBRIDEMENT, SUBACROMIAL DECOMPRESSION, MIMI-OPEN ROTATOR CUFF TEAR REPAIR AND POSSIBLE  BICEPS TENODESIS.  ;  Surgeon: Cammy Copa, MD;  Location: [facility] OR;  Service: Orthopedics;  Laterality: Right;  RIGHT SHOULDER DIAGNOSTIC OPERATIVE ARTHROSCOPY, DEBRIDEMENT, SUBACROMIAL DECOMPRESSION, MINI-OPEN ROTATOR CUFF TEAR REPAIR, POSSIB       Family History  Problem Relation Age of Onset  . Heart attack Mother        died 37, smoker  . Heart disease Mother   . Diabetes Mother   . Heart attack Father        died 44, smoker  . Heart disease Father      Social History   Tobacco Use  . Smoking status: Former Smoker    Packs/day: 1.00    Years: 20.00    Pack years: 20.00    Types: Cigarettes    Quit date: 07/05/2011    Years since quitting: 9.0  . Smokeless tobacco: Never Used  Vaping Use  . Vaping Use: Never used  Substance Use Topics  . Alcohol use: No    Alcohol/week: 0.0 standard drinks  . Drug use: No    Home Medications Prior to Admission medications   Medication Sig Start Date End Date Taking? Authorizing Provider  aspirin EC 81 MG tablet Take 81 mg by mouth daily.    [provider]  glimepiride (AMARYL) 4 MG tablet TAKE 2 TABLETS  BY MOUTH DAILY BEFORE BREAKFAST. 05/27/20   Shelva Majestic, MD  ibuprofen (ADVIL,MOTRIN) 400 MG tablet Take 400 mg by mouth every 6 (six) hours as needed.    [provider]  metoprolol succinate (TOPROL-XL) 100 MG 24 hr tablet TAKE 1 TABLET EVERY MORNING 04/15/20   Shelva Majestic, MD  rosuvastatin (CRESTOR) 5 MG tablet Take 1 tablet (5 mg total) by mouth once a week. Patient not taking: Reported on 02/10/2020 10/02/19   Shelva Majestic, MD    Allergies    Losartan potassium-hctz, Prednisone, Doxycycline, and Lisinopril  Review of Systems   Review of Systems  Constitutional: Negative for chills, diaphoresis, fatigue and fever.  HENT: Negative for congestion, sore throat and trouble swallowing.   Eyes: Negative for pain and visual disturbance.  Respiratory: Negative for cough, shortness of breath and wheezing.   Cardiovascular: Positive for chest pain. Negative for palpitations and leg swelling.  Gastrointestinal: Negative for abdominal distention, abdominal pain, diarrhea, nausea and vomiting.  Genitourinary: Negative for difficulty urinating.  Musculoskeletal: Negative for back pain, neck pain and neck stiffness.  Skin: Negative for pallor.  Neurological: Negative for dizziness, speech difficulty, weakness and headaches.  Psychiatric/Behavioral: Negative for  confusion.    Physical Exam Updated Vital Signs BP (!) 142/80 (BP Location: Right Arm)   Pulse 60   Temp 98.4 F (36.9 C) (Oral)   Resp 16   Ht 6\' 3"  (1.905 m)   Wt 124.7 kg   SpO2 98%   BMI 34.37 kg/m   Physical Exam Constitutional:      General: He is not in acute distress.    Appearance: Normal appearance. He is not ill-appearing, toxic-appearing or diaphoretic.  HENT:     Mouth/Throat:     Mouth: Mucous membranes are moist.     Pharynx: Oropharynx is clear.  Eyes:     General: No scleral icterus.    Extraocular Movements: Extraocular movements intact.     Pupils: Pupils are equal, round, and reactive to light.  Cardiovascular:     Rate and Rhythm:  Normal rate and regular rhythm.     Pulses: Normal pulses.     Heart sounds: Normal heart sounds. No murmur heard.  No friction rub. No gallop.   Pulmonary:     Effort: Pulmonary effort is normal. No respiratory distress.     Breath sounds: Normal breath sounds. No stridor. No wheezing, rhonchi or rales.  Chest:     Chest wall: No tenderness.  Abdominal:     General: Abdomen is flat. There is no distension.     Palpations: Abdomen is soft.     Tenderness: There is no abdominal tenderness. There is no guarding or rebound.  Musculoskeletal:        General: No swelling or tenderness. Normal range of motion.     Cervical back: Normal range of motion and neck supple. No rigidity.     Right lower leg: No edema.     Left lower leg: No edema.  Skin:    General: Skin is warm and dry.     Capillary Refill: Capillary refill takes less than 2 seconds.     Coloration: Skin is not pale.  Neurological:     General: No focal deficit present.     Mental Status: He is alert and oriented to person, place, and time.     Cranial Nerves: No cranial nerve deficit.     Sensory: No sensory deficit.     Motor: No weakness.  Psychiatric:        Mood and Affect: Mood normal.        Behavior: Behavior normal.        Thought Content:  Thought content normal.        Judgment: Judgment normal.     ED Results / Procedures / Treatments   Labs (all labs ordered are listed, but only abnormal results are displayed) Labs Reviewed  BASIC METABOLIC PANEL - Abnormal; Notable for the following components:      Result Value   Sodium 130 (*)    Glucose, Bld 399 (*)    All other components within normal limits  TROPONIN I (HIGH SENSITIVITY) - Abnormal; Notable for the following components:   Troponin I (High Sensitivity) 34 (*)    All other components within normal limits  TROPONIN I (HIGH SENSITIVITY) - Abnormal; Notable for the following components:   Troponin I (High Sensitivity) 258 (*)    All other components within normal limits  CBC  HEPARIN LEVEL (UNFRACTIONATED)  CBC  CBG MONITORING, ED  TROPONIN I (HIGH SENSITIVITY)  TROPONIN I (HIGH SENSITIVITY)    EKG EKG Interpretation  Date/Time:  Tuesday July 20 2020 18:51:07 EDT Ventricular Rate:  62 PR Interval:  220 QRS Duration: 107 QT Interval:  399 QTC Calculation: 406 R Axis:   -25 Text Interpretation: Sinus rhythm Short PR interval Probable left atrial enlargement Borderline left axis deviation Abnormal T, consider ischemia, inferior leads No significant change since prior today Confirmed by Meridee Score [phone]) on 07/20/2020 6:57:25 PM   Radiology DG Chest 2 View  Result Date: 07/20/2020 CLINICAL DATA:  Chest pain EXAM: CHEST - 2 VIEW COMPARISON:  01/20/2019 FINDINGS: Stable left-sided implanted cardiac device. The heart size and mediastinal contours are within normal limits. Atherosclerotic calcification of the aortic knob. No focal airspace consolidation, pleural effusion, or pneumothorax. The visualized skeletal structures are unremarkable. IMPRESSION: No active cardiopulmonary disease. Electronically Signed   By: Duanne Guess D.O.   On: 07/20/2020 12:14    Procedures .Critical Care Performed by: Farrel Gordon,  PA-C Authorized by: Farrel Gordon, PA-C   Critical care provider statement:    Critical care time (minutes):  25   Critical care was necessary to treat or prevent imminent or life-threatening deterioration of the following conditions:  Cardiac failure (NSTEMI)   Critical care was time spent personally by me on the following activities:  Discussions with consultants, evaluation of patient's response to treatment, examination of patient, ordering and performing treatments and interventions, ordering and review of laboratory studies, ordering and review of radiographic studies, pulse oximetry, re-evaluation of patient's condition, obtaining history from patient or surrogate and review of old charts   (including critical care time)  Medications Ordered in ED Medications  heparin bolus via infusion 4,000 Units (has no administration in time range)  heparin ADULT infusion 100 units/mL (25000 units/21mL sodium chloride 0.45%) (has no administration in time range)    ED Course  I have reviewed the triage vital signs and the nursing notes.  Pertinent labs & imaging results that were available during my care of the patient were reviewed by me and considered in my medical decision making (see chart for details).  Clinical Course as of Jul 21 2035  Tue Jul 20, 2020  2340 64 year old male history of pacemaker had 10 out of 10 chest pain earlier today..  Initial EKG without acute st/ts. Troponin rising. 2/10 tightness. Repeat ecg without acute changes. Will consult cardiology. Will need admission.    [MB]    Clinical Course User Index [MB] Terrilee Files, MD   MDM Rules/Calculators/A&P                         Paul Paul is a 65 y.o. male with pertinent past medical history of hypertension, obesity, diabetes, pacemaker (Saint Jude DDD status post sinus node dysfunction, cerebral aneurysm without rupture, OSA on CPAP that presents the emergency department today for chest pain.  Presentation concerning for ACS.  EKG  without any signs of ischemia. Increasing troponins, first troponin was 34, second troponin 258.  Chest x-ray without any cardiopulmonary disease, CBC and BMP stable. HEAR SCORE 5.    Chest pain has resolved from a 10 out of 10 to a 3 out of 10.  Repeat EKG without any signs of ischemia.  Will consult cardiology for increasing troponins and concern for NSTEMI.  827 spoke to cardiology who recommended heparin and will come down to see the patient.  States that wants him admitted to hospital service due to elevated sugars.  Will consult hospitalist at this time.  836 spoke to Dr. Mikeal Hawthorne, hospitalist who will accept the patient.  The patient appears reasonably stabilized for admission considering the current resources, flow, and capabilities available in the ED at this time, and I doubt any other [facility] requiring further screening and/or treatment in the ED prior to admission.  I discussed this case with my attending physician who cosigned this note including patient's presenting symptoms, physical exam, and planned diagnostics and interventions. Attending physician stated agreement with plan or made changes to plan which were implemented.   Attending physician assessed patient at bedside.   Final Clinical Impression(s) / ED Diagnoses Final diagnoses:  NSTEMI (non-ST elevated myocardial infarction) [facility])    Rx / DC Orders ED Discharge Orders    None       Farrel Gordon, PA-C 07/20/20 2103    Terrilee Files, MD 07/21/20 [phone]

## 2020-07-20 NOTE — Telephone Encounter (Signed)
Returned call to [employer] per Dr. Ladona Ridgel Pt should stay in ER d/t elevated troponin.

## 2020-07-20 NOTE — Telephone Encounter (Signed)
    Pt's step son is calling, he said pt is in ED been there for hours with other covid patients. He is afraid that pt may catch covid. He would like to know if Dr. Ladona Ridgel or Nurse can view the tests that was done at ED and maybe schedule appt for f/u in the office

## 2020-07-20 NOTE — Consult Note (Signed)
Cardiology Consultation:   Patient ID: Paul Paul MRN: [id number]; DOB: 24-Sep-1956  Admit date: 07/20/2020 Date of Consult: 07/20/2020  Primary Care Provider: Shelva Majestic, MD [facility] Cardiologist: No primary care provider on file.  [facility] Electrophysiologist:  None Ladona Ridgel   Patient Profile:   Paul Paul is a 64 y.o. male with a hx of hypertension, obesity, non insulin diabetes  Symptomatic bradycardia s/p ST. Jude PPM, prior cerbral aneurysm who is being seen today for the evaluation of chest pain at the request of ED Team PA Patel.  History of Present Illness:   Paul Paul is a 64 yo M who presents with typical anginal chest pain.  Patient has had remote history of chest pain (without obstructive CAD in 2012).  Patient notes that for the past month he has had multiple episodes of exertional chest pain, chest pressure, and shortness of breath.  This gets worse with exertion and better with rest.  Does not use NTG.  Notes that his episodes are worsening in pain severity and frequency.  On 8/17, went to a yoga class and had chest pain and chest pressure that radiated to his arm.  ED Team notes that patient took 324 ASA that reduced pain.  At present, no chest pain. No bleeding issues on ASA, no upcoming surgery.  Patient notes his normal BG is ~ 200.  Seen with son at bedside.  Son notes his DM is not well controlled, he has not been taking his statin.  Provider Note:  Son works for [employer]  Past Medical History:  Diagnosis Date  . Anxiety   . Arthritis   . Bell palsy    > 30 years. Left side of face droops a little  . Bradycardia   . Cerebral aneurysm   . Cerebral aneurysm without rupture 06/2011   Middle of Cerebral Artery  . Complication of anesthesia 2012   after surgery for cliping of cerebral anerysum- In the room, patient was shaking, and patient would awaken and then "go back out" shaking continued after he "went back out:  .  Constipation   . Diabetes mellitus    now under control  . Dysrhythmia    BRADYCARDIA REQUIRING PACEMAKER .FOLLOWED BY GREGG TAYLOR  . HYPERTENSION 12/10/2007  . Pacemaker   . Shoulder pain    L SHOULDER. RECENT FALL  . Sleep apnea    CPAP.NOT WEARING NOW.NEEDS REPLACEMENT PIECE    Past Surgical History:  Procedure Laterality Date  . CARDIAC CATHETERIZATION  07/06/11   normal  . CEREBRAL ANEURYSM REPAIR  06/2011   "stenting and coiling" per patient  . FRACTURE SURGERY Left    foot  . INSERT / REPLACE / REMOVE PACEMAKER  2012  . KNEE SURGERY Left    left; arthoscopic  . rotator cuff surgery Left 02/2012  . septal deviation    . SHOULDER ARTHROSCOPY WITH SUBACROMIAL DECOMPRESSION, ROTATOR CUFF REPAIR AND BICEP TENDON REPAIR Right 08/24/2015   Procedure: RIGHT SHOULDER DIAGNOSTIC OPERATIVE ARTHROSCOPY WITH DEBRIDEMENT, SUBACROMIAL DECOMPRESSION, MIMI-OPEN ROTATOR CUFF TEAR REPAIR AND POSSIBLE  BICEPS TENODESIS.  ;  Surgeon: Cammy Copa, MD;  Location: [facility] OR;  Service: Orthopedics;  Laterality: Right;  RIGHT SHOULDER DIAGNOSTIC OPERATIVE ARTHROSCOPY, DEBRIDEMENT, SUBACROMIAL DECOMPRESSION, MINI-OPEN ROTATOR CUFF TEAR REPAIR, POSSIB     Home Medications:  Prior to Admission medications   Medication Sig Start Date End Date Taking? Authorizing Provider  aspirin EC 81 MG tablet Take 81 mg by mouth daily.  Yes [provider]  glimepiride (AMARYL) 4 MG tablet TAKE 2 TABLETS  BY MOUTH DAILY BEFORE BREAKFAST. Patient taking differently: Take 8 mg by mouth daily.  05/27/20  Yes Shelva Majestic, MD  ibuprofen (ADVIL,MOTRIN) 400 MG tablet Take 400 mg by mouth every 6 (six) hours as needed.   Yes [provider]  metoprolol succinate (TOPROL-XL) 100 MG 24 hr tablet TAKE 1 TABLET EVERY MORNING Patient taking differently: Take 100 mg by mouth daily.  04/15/20  Yes Shelva Majestic, MD  rosuvastatin (CRESTOR) 5 MG tablet Take 1 tablet (5 mg total) by mouth once a  week. Patient not taking: Reported on 02/10/2020 10/02/19   Shelva Majestic, MD   Inpatient Medications: Scheduled Meds: . [START ON 07/21/2020] aspirin EC  81 mg Oral Daily  . atorvastatin  80 mg Oral Daily  . heparin  4,000 Units Intravenous Once   Continuous Infusions: . heparin      Allergies:    Allergies  Allergen Reactions  . Losartan Potassium-Hctz Other (See Comments)    Caused dizziness & fatigue  . Prednisone Other (See Comments)    Raises blood sugar  . Doxycycline Rash and Nausea Only  . Lisinopril Rash    Social History:   Social History   Socioeconomic History  . Marital status: Married    Spouse name: Not on file  . Number of children: Not on file  . Years of education: Not on file  . Highest education level: Not on file  Occupational History  . Not on file  Tobacco Use  . Smoking status: Former Smoker    Packs/day: 1.00    Years: 20.00    Pack years: 20.00    Types: Cigarettes    Quit date: 07/05/2011    Years since quitting: 9.0  . Smokeless tobacco: Never Used  Vaping Use  . Vaping Use: Never used  Substance and Sexual Activity  . Alcohol use: No    Alcohol/week: 0.0 standard drinks  . Drug use: No  . Sexual activity: Not on file  Other Topics Concern  . Not on file  Social History Narrative   Married over 30 years in 2015. 1 step son. 1 grandson.       Disabled from brain aneurysm, pacemaker. Retired from [employer] years.       Hobbies: grandson, exercise at [facility]   Social Determinants of Health   Financial Resource Strain:   . Difficulty of Paying Living Expenses:   Food Insecurity:   . Worried About Programme researcher, broadcasting/film/video in the Last Year:   . Barista in the Last Year:   Transportation Needs:   . Freight forwarder (Medical):   Marland Kitchen Lack of Transportation (Non-Medical):   Physical Activity:   . Days of Exercise per Week:   . Minutes of Exercise per Session:   Stress:   . Feeling of Stress :   Social  Connections:   . Frequency of Communication with Friends and Family:   . Frequency of Social Gatherings with Friends and Family:   . Attends Religious Services:   . Active Member of Clubs or Organizations:   . Attends Banker Meetings:   Marland Kitchen Marital Status:   Intimate Partner Violence:   . Fear of Current or Ex-Partner:   . Emotionally Abused:   Marland Kitchen Physically Abused:   . Sexually Abused:     Family History:    Family History  Problem Relation Age  of Onset  . Heart attack Mother        died 71, smoker  . Heart disease Mother   . Diabetes Mother   . Heart attack Father        died 63, smoker  . Heart disease Father      ROS:  Please see the history of present illness.  All other ROS reviewed and negative.     Physical Exam/Data:   Vitals:   07/20/20 1136 07/20/20 1402  BP: (!) 178/85 (!) 142/80  Pulse: 60 60  Resp: 18 16  Temp: 98.8 F (37.1 C) 98.4 F (36.9 C)  TempSrc: Oral Oral  SpO2: 97% 98%  Weight: 124.7 kg   Height: 6\' 3"  (1.905 m)    No intake or output data in the 24 hours ending 07/20/20 2204 Last 3 Weights 07/20/2020 10/02/2019 10/02/2019  Weight (lbs) 275 lb 275 lb 3.2 oz 275 lb 3.2 oz  Weight (kg) 124.739 kg 124.83 kg 124.83 kg     Body mass index is 34.37 kg/m.  General:  Obese male in no apparent distress HEENT: normal Lymph: no adenopathy Neck: no JVD Endocrine:  No thryomegaly Cardiac:  normal S1, S2; RRR; subtle 1/6 systolic murmur Lungs:  clear to auscultation bilaterally, no wheezing, rhonchi or rales  Abd: soft, nontender, no hepatomegaly  Ext: no edema Musculoskeletal:  No deformities, BUE and BLE strength normal and equal Skin: warm and dry  Neuro:  CNs 2-12 intact, no focal abnormalities noted Psych:  Normal affect   EKG:  The EKG was personally reviewed and demonstrates:  Sinus rhythn; AS, VS, rate 64 with inferior TWI similar to 01/02/19 Telemetry:  Telemetry was personally reviewed and demonstrates:  Sinus rhythm,  occasional VP (60) and occasional PVCs  Relevant CV Studies: Unable to pull 2012 LCP films at this time  Laboratory Data:  High Sensitivity Troponin:   Recent Labs  Lab 07/20/20 1142 07/20/20 1538  TROPONINIHS 34* 258*     Chemistry Recent Labs  Lab 07/20/20 1142  NA 130*  K 4.2  CL 98  CO2 23  GLUCOSE 399*  BUN 11  CREATININE 1.06  CALCIUM 9.6  GFRNONAA >60  GFRAA >60  ANIONGAP 9    Hematology Recent Labs  Lab 07/20/20 1142  WBC 5.8  RBC 5.29  HGB 15.9  HCT 47.5  MCV 89.8  MCH 30.1  MCHC 33.5  RDW 12.8  PLT 170   Radiology/Studies:  DG Chest 2 View  Result Date: 07/20/2020 CLINICAL DATA:  Chest pain EXAM: CHEST - 2 VIEW COMPARISON:  01/20/2019 FINDINGS: Stable left-sided implanted cardiac device. The heart size and mediastinal contours are within normal limits. Atherosclerotic calcification of the aortic knob. No focal airspace consolidation, pleural effusion, or pneumothorax. The visualized skeletal structures are unremarkable. IMPRESSION: No active cardiopulmonary disease. Electronically Signed   By: Duanne Guess D.O.   On: 07/20/2020 12:14     TIMI Risk Score for Unstable Angina or Non-ST Elevation MI:   The patient's TIMI risk score is 3, which indicates a 13% risk of all cause mortality, new or recurrent myocardial infarction or need for urgent revascularization in the next 14 days.      Assessment and Plan:   NSTEMI In the setting of Diabetes (Type II, poorly controlled without known evidence of end organ dysfunction) Prior family history of CAD Hypertensive urgency/emergency Hyperlipidemia OSA on CPAP Cerebral Aneurysm (s/p stenting) Obesity Bradycardia s/p PPM - continue ASA; agree with heparin drip - given  high pretest probability, recommended LCP (R radial OK, Contrast limit ~180, DES OK); NPO at midnight (ordered) - starting high intensity statin (ordered) - Echo for 8/18 (ordered) - continue tele; at this time no evidence of PPM  dysfunction (ordered) - can continue metoprolol succinate 100 mg - Unclear ACEi/ARB; can add afterload reducing medication for BP control (will defer to primary at this time but happy to assist) - continue to trend troponin  For questions or updates, please contact [facility] Please consult [email or web address] for contact info under    Signed, Christell Constant, MD  07/20/2020 10:04 PM

## 2020-07-20 NOTE — H&P (Signed)
History and Physical   Paul Paul LNL:[id number] DOB: [birth year] DOA: 07/20/2020  Referring MD/NP/PA: Dr. Charm Barges  PCP: Shelva Majestic, MD   Outpatient Specialists: Dr. Sharrell Ku, cardiology  Patient coming from: Home  Chief Complaint: Chest pain  HPI: Paul Paul is a 64 y.o. male with medical history significant of sick sinus syndrome status post pacemaker placement, anxiety disorder, morbid obesity, diabetes, cerebral aneurysm and obstructive sleep apnea not on CPAP presenting with chest pain.  Chest pain started today rated as 7 out of 10 in the mid chest.  Associated with mild shortness of breath.  No cough no wheeze.  Patient took his medications this only beta-blocker and aspirin.  No nitroglycerin at home.  He continues to have significant chest pain so he came to the ER.  In the ER he was noted to have 10 out of 10 pain in the beginning given nitroglycerin aspirin.  No radiation of his chest pain.  Mild shortness of breath in the beginning which has cleared.  Patient otherwise is now chest pain-free.  He denies significant nausea and vomiting.  Initial enzymes rising from 34-2 58 and then 359 patient therefore appears to have non-ST elevation MI and is being admitted to the hospital for evaluation and treatment.  Cardiology has been consulted..  ED Course: Temperature 98.8 blood pressure 178/85 pulse 60 respiratory of 18 oxygen sat 97% room air.  Sodium is 130 glucose 399 otherwise CBC and chemistry appear to be within normal.  Troponin is elevated from 34-50 and 359.  Chest x-ray showed no acute findings and EKG shows no evidence of ST elevation.  Patient being admitted with non-ST elevation MI.  Review of Systems: As per HPI otherwise 10 point review of systems negative.    Past Medical History:  Diagnosis Date  . Anxiety   . Arthritis   . Bell palsy    > 30 years. Left side of face droops a little  . Bradycardia   . Cerebral aneurysm   . Cerebral aneurysm without  rupture 06/2011   Middle of Cerebral Artery  . Complication of anesthesia 2012   after surgery for cliping of cerebral anerysum- In the room, patient was shaking, and patient would awaken and then "go back out" shaking continued after he "went back out:  . Constipation   . Diabetes mellitus    now under control  . Dysrhythmia    [employer]IA REQUIRING PACEMAKER .FOLLOWED BY GREGG TAYLOR  . HYPERTENSION 12/10/2007  . Pacemaker   . Shoulder pain    L SHOULDER. RECENT FALL  . Sleep apnea    CPAP.NOT WEARING NOW.NEEDS REPLACEMENT PIECE    Past Surgical History:  Procedure Laterality Date  . CARDIAC CATHETERIZATION  07/06/11   normal  . CEREBRAL ANEURYSM REPAIR  06/2011   "stenting and coiling" per patient  . FRACTURE SURGERY Left    foot  . INSERT / REPLACE / REMOVE PACEMAKER  2012  . KNEE SURGERY Left    left; arthoscopic  . rotator cuff surgery Left 02/2012  . septal deviation    . SHOULDER ARTHROSCOPY WITH SUBACROMIAL DECOMPRESSION, ROTATOR CUFF REPAIR AND BICEP TENDON REPAIR Right 08/24/2015   Procedure: RIGHT SHOULDER DIAGNOSTIC OPERATIVE ARTHROSCOPY WITH DEBRIDEMENT, SUBACROMIAL DECOMPRESSION, MIMI-OPEN ROTATOR CUFF TEAR REPAIR AND POSSIBLE  BICEPS TENODESIS.  ;  Surgeon: Cammy Copa, MD;  Location: [facility] OR;  Service: Orthopedics;  Laterality: Right;  RIGHT SHOULDER DIAGNOSTIC OPERATIVE ARTHROSCOPY, DEBRIDEMENT, SUBACROMIAL DECOMPRESSION, MINI-OPEN ROTATOR CUFF TEAR REPAIR, POSSIB  reports that he quit smoking about 9 years ago. His smoking use included cigarettes. He has a 20.00 pack-year smoking history. He has never used smokeless tobacco. He reports that he does not drink alcohol and does not use drugs.  Allergies  Allergen Reactions  . Losartan Potassium-Hctz Other (See Comments)    Caused dizziness & fatigue  . Prednisone Other (See Comments)    Raises blood sugar  . Doxycycline Rash and Nausea Only  . Lisinopril Rash    Family History  Problem Relation Age of  Onset  . Heart attack Mother        died 4454, smoker  . Heart disease Mother   . Diabetes Mother   . Heart attack Father        died 3154, smoker  . Heart disease Father      Prior to Admission medications   Medication Sig Start Date End Date Taking? Authorizing Provider  aspirin EC 81 MG tablet Take 81 mg by mouth daily.    [provider]  glimepiride (AMARYL) 4 MG tablet TAKE 2 TABLETS  BY MOUTH DAILY BEFORE BREAKFAST. 05/27/20   Shelva MajesticHunter, Stephen O, MD  ibuprofen (ADVIL,MOTRIN) 400 MG tablet Take 400 mg by mouth every 6 (six) hours as needed.    [provider]  metoprolol succinate (TOPROL-XL) 100 MG 24 hr tablet TAKE 1 TABLET EVERY MORNING 04/15/20   Shelva MajesticHunter, Stephen O, MD  rosuvastatin (CRESTOR) 5 MG tablet Take 1 tablet (5 mg total) by mouth once a week. Patient not taking: Reported on 02/10/2020 10/02/19   Shelva MajesticHunter, Stephen O, MD    Physical Exam: Vitals:   07/20/20 1136 07/20/20 1402  BP: (!) 178/85 (!) 142/80  Pulse: 60 60  Resp: 18 16  Temp: 98.8 F (37.1 C) 98.4 F (36.9 C)  TempSrc: Oral Oral  SpO2: 97% 98%  Weight: 124.7 kg   Height: 6\' 3"  (1.905 m)       Constitutional: Morbidly obese, pleasant no distress Vitals:   07/20/20 1136 07/20/20 1402  BP: (!) 178/85 (!) 142/80  Pulse: 60 60  Resp: 18 16  Temp: 98.8 F (37.1 C) 98.4 F (36.9 C)  TempSrc: Oral Oral  SpO2: 97% 98%  Weight: 124.7 kg   Height: 6\' 3"  (1.905 m)    Eyes: PERRL, lids and conjunctivae normal ENMT: Mucous membranes are moist. Posterior pharynx clear of any exudate or lesions.Normal dentition.  Neck: normal, supple, no masses, no thyromegaly Respiratory: clear to auscultation bilaterally, no wheezing, no crackles. Normal respiratory effort. No accessory muscle use.  Cardiovascular: Regular rate and rhythm, no murmurs / rubs / gallops. No extremity edema. 2+ pedal pulses. No carotid bruits.  Abdomen: no tenderness, no masses palpated. No hepatosplenomegaly. Bowel sounds  positive.  Musculoskeletal: no clubbing / cyanosis. No joint deformity upper and lower extremities. Good ROM, no contractures. Normal muscle tone.  Skin: no rashes, lesions, ulcers. No induration Neurologic: CN 2-12 grossly intact. Sensation intact, DTR normal. Strength 5/5 in all 4.  Psychiatric: Anxious, fully awake alert and oriented no distress    Labs on Admission: I have personally reviewed following labs and imaging studies  CBC: Recent Labs  Lab 07/20/20 1142  WBC 5.8  HGB 15.9  HCT 47.5  MCV 89.8  PLT 170   Basic Metabolic Panel: Recent Labs  Lab 07/20/20 1142  NA 130*  K 4.2  CL 98  CO2 23  GLUCOSE 399*  BUN 11  CREATININE 1.06  CALCIUM 9.6   GFR:  Estimated Creatinine Clearance: 98.9 mL/min (by C-G formula based on SCr of 1.06 mg/dL). Liver Function Tests: No results for input(s): AST, ALT, ALKPHOS, BILITOT, PROT, ALBUMIN in the last 168 hours. No results for input(s): LIPASE, AMYLASE in the last 168 hours. No results for input(s): AMMONIA in the last 168 hours. Coagulation Profile: No results for input(s): INR, PROTIME in the last 168 hours. Cardiac Enzymes: No results for input(s): CKTOTAL, CKMB, CKMBINDEX, TROPONINI in the last 168 hours. BNP (last 3 results) No results for input(s): PROBNP in the last 8760 hours. HbA1C: No results for input(s): HGBA1C in the last 72 hours. CBG: No results for input(s): GLUCAP in the last 168 hours. Lipid Profile: No results for input(s): CHOL, HDL, LDLCALC, TRIG, CHOLHDL, LDLDIRECT in the last 72 hours. Thyroid Function Tests: No results for input(s): TSH, T4TOTAL, FREET4, T3FREE, THYROIDAB in the last 72 hours. Anemia Panel: No results for input(s): VITAMINB12, FOLATE, FERRITIN, TIBC, IRON, RETICCTPCT in the last 72 hours. Urine analysis:    Component Value Date/Time   COLORURINE YELLOW 07/04/2011 1000   APPEARANCEUR CLEAR 07/04/2011 1000   LABSPEC 1.018 07/04/2011 1000   PHURINE 6.0 07/04/2011 1000    GLUCOSEU NEGATIVE 07/04/2011 1000   HGBUR NEGATIVE 07/04/2011 1000   BILIRUBINUR n 11/01/2012 0829   KETONESUR NEGATIVE 07/04/2011 1000   PROTEINUR n 11/01/2012 0829   PROTEINUR NEGATIVE 07/04/2011 1000   UROBILINOGEN 0.2 11/01/2012 0829   UROBILINOGEN 1.0 07/04/2011 1000   NITRITE n 11/01/2012 0829   NITRITE NEGATIVE 07/04/2011 1000   LEUKOCYTESUR Negative 11/01/2012 0829   Sepsis Labs: @LABRCNTIP (procalcitonin:4,lacticidven:4) )No results found for this or any previous visit (from the past 240 hour(s)).   Radiological Exams on Admission: DG Chest 2 View  Result Date: 07/20/2020 CLINICAL DATA:  Chest pain EXAM: CHEST - 2 VIEW COMPARISON:  01/20/2019 FINDINGS: Stable left-sided implanted cardiac device. The heart size and mediastinal contours are within normal limits. Atherosclerotic calcification of the aortic knob. No focal airspace consolidation, pleural effusion, or pneumothorax. The visualized skeletal structures are unremarkable. IMPRESSION: No active cardiopulmonary disease. Electronically Signed   By: 01/22/2019 D.O.   On: 07/20/2020 12:14    EKG: Independently reviewed.  It shows sinus rhythm with a rate of 62.  Short PR interval.  Nonspecific ST changes.   Assessment/Plan Active Problems:   Poorly controlled type 2 diabetes mellitus (HCC)   Hypertension associated with diabetes (HCC)   Pacemaker   Obesity   OSA on CPAP   Hyperlipidemia associated with type 2 diabetes mellitus (HCC)   GAD (generalized anxiety disorder)   Sinus node dysfunction (HCC)   NSTEMI (non-ST elevated myocardial infarction) (HCC)     #1 no ST elevation MI: Patient will be placed on IV heparin, aspirin and statin.  Beta-blockers.  Cardiology consulted.  Patient to be admitted to the hospital for further evaluation and treatment.  Will follow cardiology recommendations.  #2  Hypertension: Initiated on beta-blocker.  Has been off medications for blood pressure.  #3 uncontrolled diabetes:  initiate sliding scale insulin.  Continue treatment of monitoring.  On glimepiride at home  #4 morbid obesity: Dietary counseling given.  #5 obstructive sleep apnea: Patient is supposed to be on CPAP but he is on broke down and has not gotten a new one.  Currently therefore not using CPAP at home.  #6 history of sick sinus syndrome: Status post pacemaker placement.  Continue close monitoring  #7 generalized anxiety disorder: Counseling provided.  On Ativan.   DVT prophylaxis: Heparin  drip Code Status: Full code Family Communication: Son at bedside Disposition Plan: Home Consults called: [place] cardiology Admission status: Inpatient  Severity of Illness: The appropriate patient status for this patient is INPATIENT. Inpatient status is judged to be reasonable and necessary in order to provide the required intensity of service to ensure the patient's safety. The patient's presenting symptoms, physical exam findings, and initial radiographic and laboratory data in the context of their chronic comorbidities is felt to place them at high risk for further clinical deterioration. Furthermore, it is not anticipated that the patient will be medically stable for discharge from the hospital within 2 midnights of admission. The following factors support the patient status of inpatient.   " The patient's presenting symptoms include chest pain. " The worrisome physical exam findings include morbidly obese. " The initial radiographic and laboratory data are worrisome because of elevated cardiac enzymes. " The chronic co-morbidities include diabetes with hypertension.   * I certify that at the point of admission it is my clinical judgment that the patient will require inpatient hospital care spanning beyond 2 midnights from the point of admission due to high intensity of service, high risk for further deterioration and high frequency of surveillance required.Lonia Blood MD [facility] Pager [phone]  If 7PM-7AM, please contact night-coverage [email or web address] Password [facility]  07/20/2020, 8:45 PM

## 2020-07-20 NOTE — ED Notes (Signed)
Dr Silverio Lay aware delta trop 258

## 2020-07-21 ENCOUNTER — Inpatient Hospital Stay (HOSPITAL_COMMUNITY)
Admission: EM | Disposition: A | Discharge status: Home / Self Care | Payer: Self-pay | Source: Home / Self Care | Attending: Internal Medicine

## 2020-07-21 ENCOUNTER — Inpatient Hospital Stay (HOSPITAL_COMMUNITY): Payer: Medicare HMO

## 2020-07-21 DIAGNOSIS — I2510 Atherosclerotic heart disease of native coronary artery without angina pectoris: Secondary | ICD-10-CM

## 2020-07-21 DIAGNOSIS — I214 Non-ST elevation (NSTEMI) myocardial infarction: Secondary | ICD-10-CM

## 2020-07-21 HISTORY — PX: LEFT HEART CATH AND CORONARY ANGIOGRAPHY: CATH118249

## 2020-07-21 HISTORY — PX: CORONARY STENT INTERVENTION: CATH118234

## 2020-07-21 LAB — CBG MONITORING, ED
Glucose-Capillary: 279 mg/dL — ABNORMAL HIGH (ref 70–99)
Glucose-Capillary: 220 mg/dL — ABNORMAL HIGH (ref 70–99)
Glucose-Capillary: 153 mg/dL — ABNORMAL HIGH (ref 70–99)

## 2020-07-21 LAB — GLUCOSE, CAPILLARY
Glucose-Capillary: 139 mg/dL — ABNORMAL HIGH (ref 70–99)
Glucose-Capillary: 311 mg/dL — ABNORMAL HIGH (ref 70–99)

## 2020-07-21 LAB — LIPID PANEL
Cholesterol: 179 mg/dL (ref 0–200)
HDL: 40 mg/dL — ABNORMAL LOW (ref >40)
LDL Cholesterol: 111 mg/dL — ABNORMAL HIGH (ref 0–99)
Total CHOL/HDL Ratio: 4.5 RATIO
Triglycerides: 140 mg/dL (ref <150)
VLDL: 28 mg/dL (ref 0–40)

## 2020-07-21 LAB — ECHOCARDIOGRAM COMPLETE
Area-P 1/2: 2.69 cm2
Height: 75 in
S' Lateral: 2.8 cm
Weight: 4400 oz

## 2020-07-21 LAB — TROPONIN I (HIGH SENSITIVITY): Troponin I (High Sensitivity): 243 ng/L (ref <18)

## 2020-07-21 LAB — SARS CORONAVIRUS 2 BY RT PCR (HOSPITAL ORDER, PERFORMED IN ~~LOC~~ HOSPITAL LAB): SARS Coronavirus 2: NEGATIVE

## 2020-07-21 LAB — CBC
HCT: 46.9 % (ref 39.0–52.0)
Hemoglobin: 15.7 g/dL (ref 13.0–17.0)
MCH: 30.4 pg (ref 26.0–34.0)
MCHC: 33.5 g/dL (ref 30.0–36.0)
MCV: 90.9 fL (ref 80.0–100.0)
Platelets: 172 10*3/uL (ref 150–400)
RBC: 5.16 MIL/uL (ref 4.22–5.81)
RDW: 13.1 % (ref 11.5–15.5)
WBC: 6.9 10*3/uL (ref 4.0–10.5)
nRBC: 0 % (ref 0.0–0.2)

## 2020-07-21 LAB — HEPARIN LEVEL (UNFRACTIONATED)
Heparin Unfractionated: 0.26 IU/mL — ABNORMAL LOW (ref 0.30–0.70)
Heparin Unfractionated: 0.45 IU/mL (ref 0.30–0.70)

## 2020-07-21 LAB — POCT ACTIVATED CLOTTING TIME: Activated Clotting Time: 544 seconds

## 2020-07-21 LAB — HIV ANTIBODY (ROUTINE TESTING W REFLEX): HIV Screen 4th Generation wRfx: NONREACTIVE

## 2020-07-21 LAB — HEMOGLOBIN A1C
Hgb A1c MFr Bld: 10.4 % — ABNORMAL HIGH (ref 4.8–5.6)
Mean Plasma Glucose: 251.78 mg/dL

## 2020-07-21 SURGERY — LEFT HEART CATH AND CORONARY ANGIOGRAPHY
Anesthesia: LOCAL

## 2020-07-21 MED ORDER — SODIUM CHLORIDE 0.9 % WEIGHT BASED INFUSION: 1.0000 mL/kg/h | INTRAVENOUS | Status: DC

## 2020-07-21 MED ORDER — ROSUVASTATIN CALCIUM 5 MG PO TABS: 10.0000 mg | ORAL_TABLET | Freq: Every day | ORAL | Status: DC

## 2020-07-21 MED ORDER — INSULIN ASPART 100 UNIT/ML ~~LOC~~ SOLN
0.0000 [IU] | Freq: Three times a day (TID) | SUBCUTANEOUS | Status: DC
  Administered 2020-07-21: 7 [IU] via SUBCUTANEOUS
  Administered 2020-07-22: 4 [IU] via SUBCUTANEOUS

## 2020-07-21 MED ORDER — MIDAZOLAM HCL 2 MG/2ML IJ SOLN
INTRAMUSCULAR | Status: DC | PRN
  Administered 2020-07-21 (×3): 1 mg via INTRAVENOUS

## 2020-07-21 MED ORDER — ENOXAPARIN SODIUM 40 MG/0.4ML ~~LOC~~ SOLN
40.0000 mg | SUBCUTANEOUS | Status: DC
  Filled 2020-07-21: qty 0.4

## 2020-07-21 MED ORDER — MIDAZOLAM HCL 2 MG/2ML IJ SOLN
INTRAMUSCULAR | Status: AC
  Filled 2020-07-21: qty 2

## 2020-07-21 MED ORDER — TICAGRELOR 90 MG PO TABS
ORAL_TABLET | ORAL | Status: DC | PRN
  Administered 2020-07-21: 180 mg via ORAL

## 2020-07-21 MED ORDER — SODIUM CHLORIDE 0.9 % WEIGHT BASED INFUSION
1.0000 mL/kg/h | INTRAVENOUS | Status: AC
  Administered 2020-07-21: 1 mL/kg/h via INTRAVENOUS

## 2020-07-21 MED ORDER — LIDOCAINE HCL (PF) 1 % IJ SOLN
INTRAMUSCULAR | Status: DC | PRN
  Administered 2020-07-21: 2 mL

## 2020-07-21 MED ORDER — LIDOCAINE HCL (PF) 1 % IJ SOLN
INTRAMUSCULAR | Status: AC
  Filled 2020-07-21: qty 30

## 2020-07-21 MED ORDER — TICAGRELOR 90 MG PO TABS
ORAL_TABLET | ORAL | Status: AC
  Filled 2020-07-21: qty 1

## 2020-07-21 MED ORDER — SODIUM CHLORIDE 0.9 % WEIGHT BASED INFUSION: 3.0000 mL/kg/h | INTRAVENOUS | Status: DC

## 2020-07-21 MED ORDER — HEPARIN SODIUM (PORCINE) 1000 UNIT/ML IJ SOLN
INTRAMUSCULAR | Status: AC
  Filled 2020-07-21: qty 1

## 2020-07-21 MED ORDER — SODIUM CHLORIDE 0.9% FLUSH: 3.0000 mL | INTRAVENOUS | Status: DC | PRN

## 2020-07-21 MED ORDER — ALPRAZOLAM 0.25 MG PO TABS: 0.2500 mg | ORAL_TABLET | Freq: Two times a day (BID) | ORAL | Status: DC | PRN

## 2020-07-21 MED ORDER — VERAPAMIL HCL 2.5 MG/ML IV SOLN
INTRAVENOUS | Status: AC
  Filled 2020-07-21: qty 2

## 2020-07-21 MED ORDER — HEPARIN (PORCINE) IN NACL 1000-0.9 UT/500ML-% IV SOLN
INTRAVENOUS | Status: DC | PRN
  Administered 2020-07-21 (×2): 500 mL

## 2020-07-21 MED ORDER — SODIUM CHLORIDE 0.9 % IV SOLN: INTRAVENOUS | Status: DC

## 2020-07-21 MED ORDER — METOPROLOL TARTRATE 25 MG PO TABS
25.0000 mg | ORAL_TABLET | Freq: Two times a day (BID) | ORAL | Status: DC
  Administered 2020-07-21 – 2020-07-22 (×2): 25 mg via ORAL
  Filled 2020-07-21 (×3): qty 1

## 2020-07-21 MED ORDER — IOHEXOL 350 MG/ML SOLN
INTRAVENOUS | Status: DC | PRN
  Administered 2020-07-21: 130 mL

## 2020-07-21 MED ORDER — INSULIN ASPART 100 UNIT/ML ~~LOC~~ SOLN
0.0000 [IU] | Freq: Every day | SUBCUTANEOUS | Status: DC
  Administered 2020-07-21: 4 [IU] via SUBCUTANEOUS
  Administered 2020-07-21: 3 [IU] via SUBCUTANEOUS

## 2020-07-21 MED ORDER — FENTANYL CITRATE (PF) 100 MCG/2ML IJ SOLN
INTRAMUSCULAR | Status: DC | PRN
  Administered 2020-07-21: 25 ug via INTRAVENOUS
  Administered 2020-07-21: 50 ug via INTRAVENOUS

## 2020-07-21 MED ORDER — HEPARIN SODIUM (PORCINE) 1000 UNIT/ML IJ SOLN
INTRAMUSCULAR | Status: DC | PRN
  Administered 2020-07-21: 6000 [IU] via INTRAVENOUS
  Administered 2020-07-21: 7000 [IU] via INTRAVENOUS

## 2020-07-21 MED ORDER — FENTANYL CITRATE (PF) 100 MCG/2ML IJ SOLN
INTRAMUSCULAR | Status: AC
  Filled 2020-07-21: qty 2

## 2020-07-21 MED ORDER — ASPIRIN 81 MG PO CHEW
81.0000 mg | CHEWABLE_TABLET | Freq: Every day | ORAL | Status: DC
  Administered 2020-07-22: 81 mg via ORAL
  Filled 2020-07-21: qty 1

## 2020-07-21 MED ORDER — SODIUM CHLORIDE 0.9 % IV SOLN: 250.0000 mL | INTRAVENOUS | Status: DC | PRN

## 2020-07-21 MED ORDER — NITROGLYCERIN 1 MG/10 ML FOR IR/CATH LAB
INTRA_ARTERIAL | Status: AC
  Filled 2020-07-21: qty 10

## 2020-07-21 MED ORDER — SODIUM CHLORIDE 0.9% FLUSH
3.0000 mL | Freq: Two times a day (BID) | INTRAVENOUS | Status: DC
  Administered 2020-07-22: 3 mL via INTRAVENOUS

## 2020-07-21 MED ORDER — ONDANSETRON HCL 4 MG/2ML IJ SOLN: 4.0000 mg | Freq: Four times a day (QID) | INTRAMUSCULAR | Status: DC | PRN

## 2020-07-21 MED ORDER — ASPIRIN 325 MG PO TABS
325.0000 mg | ORAL_TABLET | Freq: Every day | ORAL | Status: DC
  Administered 2020-07-21: 325 mg via ORAL
  Filled 2020-07-21: qty 1

## 2020-07-21 MED ORDER — ACETAMINOPHEN 325 MG PO TABS: 650.0000 mg | ORAL_TABLET | ORAL | Status: DC | PRN

## 2020-07-21 MED ORDER — VERAPAMIL HCL 2.5 MG/ML IV SOLN
INTRAVENOUS | Status: DC | PRN
  Administered 2020-07-21 (×2): 5 mL via INTRA_ARTERIAL

## 2020-07-21 MED ORDER — SODIUM CHLORIDE 0.9 % WEIGHT BASED INFUSION
1.0000 mL/kg/h | INTRAVENOUS | Status: DC
Start: 2020-07-22 — End: 2020-07-21

## 2020-07-21 MED ORDER — TICAGRELOR 90 MG PO TABS
90.0000 mg | ORAL_TABLET | Freq: Two times a day (BID) | ORAL | Status: DC
  Administered 2020-07-21 – 2020-07-22 (×2): 90 mg via ORAL
  Filled 2020-07-21 (×2): qty 1

## 2020-07-21 SURGICAL SUPPLY — 23 items
BALLN EMERGE MR 2.5X12 (BALLOONS) ×2
BALLN SAPPHIRE ~~LOC~~ 2.75X15 (BALLOONS) ×1 IMPLANT
BALLN SAPPHIRE ~~LOC~~ 3.0X15 (BALLOONS) ×1 IMPLANT
BALLOON EMERGE MR 2.5X12 (BALLOONS) IMPLANT
CATH INFINITI 5FR JK (CATHETERS) ×1 IMPLANT
CATH INFINITI JR4 5F (CATHETERS) ×1 IMPLANT
CATH LAUNCHER 6FR EBU3.5 (CATHETERS) ×1 IMPLANT
DEVICE RAD COMP TR BAND LRG (VASCULAR PRODUCTS) ×1 IMPLANT
DEVICE TORQUE .014-.018 (MISCELLANEOUS) IMPLANT
DEVICE TORQUE .025-.038 (MISCELLANEOUS) ×1 IMPLANT
ELECT DEFIB PAD ADLT CADENCE (PAD) ×1 IMPLANT
GLIDESHEATH SLEND SS 6F .021 (SHEATH) ×1 IMPLANT
GUIDEWIRE INQWIRE 1.5J.035X260 (WIRE) IMPLANT
INQWIRE 1.5J .035X260CM (WIRE) ×2
KIT ENCORE 26 ADVANTAGE (KITS) ×1 IMPLANT
KIT HEART LEFT (KITS) ×2 IMPLANT
PACK CARDIAC CATHETERIZATION (CUSTOM PROCEDURE TRAY) ×2 IMPLANT
SHEATH 6FR 85 DEST SLENDER (SHEATH) ×1 IMPLANT
STENT RESOLUTE ONYX 2.5X26 (Permanent Stent) ×1 IMPLANT
TORQUE DEVICE .014-.018 (MISCELLANEOUS) ×2
TRANSDUCER W/STOPCOCK (MISCELLANEOUS) ×2 IMPLANT
TUBING CIL FLEX 10 FLL-RA (TUBING) ×2 IMPLANT
WIRE RUNTHROUGH .014X180CM (WIRE) ×2 IMPLANT

## 2020-07-21 NOTE — ED Notes (Signed)
Breakfast Ordered 

## 2020-07-21 NOTE — Progress Notes (Signed)
PROGRESS NOTE    Paul Paul  WUJ:[id number] DOB: 08-29-1956 DOA: 07/20/2020 PCP: Shelva Majestic, MD   Brief Narrative:  HPI on 07/21/2020 by Dr. Earlie Lou Paul Paul is a 64 y.o. male with medical history significant of sick sinus syndrome status post pacemaker placement, anxiety disorder, morbid obesity, diabetes, cerebral aneurysm and obstructive sleep apnea not on CPAP presenting with chest pain.  Chest pain started today rated as 7 out of 10 in the mid chest.  Associated with mild shortness of breath.  No cough no wheeze.  Patient took his medications this only beta-blocker and aspirin.  No nitroglycerin at home.  He continues to have significant chest pain so he came to the ER.  In the ER he was noted to have 10 out of 10 pain in the beginning given nitroglycerin aspirin.  No radiation of his chest pain.  Mild shortness of breath in the beginning which has cleared.  Patient otherwise is now chest pain-free.  He denies significant nausea and vomiting.  Initial enzymes rising from 34-2 58 and then 359 patient therefore appears to have non-ST elevation MI and is being admitted to the hospital for evaluation and treatment.  Cardiology has been consulted.  Interim history Admitted with NSTEMI. Cardiology consulted, planning cath and echocardiogram.  Assessment & Plan   NSTEMI -Presented with typical anginal type chest pain -EKG reviewed and shows sinus rhythm with a first-degree AV block and T wave inversions in leads aVF and III -High-sensitivity troponin peaked at 359, however down to 243 -Currently on IV heparin -Patient currently denies chest pain -Echocardiogram pending -Patient was started on metoprolol, statin and aspirin -Cardiology consulted and appreciated, planning for heart catheterization later today  Essential hypertension -Started on Lopressor 25 mg twice daily -Per cardiology, start losartan 25 mg after catheterization  Diabetes mellitus type II, uncontrolled  with hyperglycemia -Hemoglobin A1c 10.4 -Patient is on glimepiride at home which is currently held -Continue insulin sliding scale and CBG monitoring  Morbid obesity -Patient to follow-up with PCP to discuss lifestyle modifications  Obstructive sleep apnea -Patient states he was supposed to be on CPAP however his machine broke down and he has not received  History of sick sinus syndrome -Status post pacemaker placement-[employer] -Patient follows with Dr. Ladona Ridgel, EP  Hyperlipidemia -Lipid panel shows cholesterol 179, triglycerides 140, HDL 40, LDL 111 -Continue statin  General anxiety disorder -Continue Ativan PRN  DVT Prophylaxis  Heparin  Code Status: Full  Family Communication: None at bedside  Disposition Plan:  Status is: Inpatient  Remains inpatient appropriate because:Ongoing diagnostic testing needed not appropriate for outpatient work up and Inpatient level of care appropriate due to severity of illness   Dispo: The patient is from: Home              Anticipated d/c is to: Home              Anticipated d/c date is: 2 days              Patient currently is not medically stable to d/c.  Consultants Cardiology  Procedures  None  Antibiotics   Anti-infectives (From admission, onward)   None      Subjective:   Paul Paul seen and examined today.  Denies further chest pain at this time.  States this chest pain started while he was doing yoga.  States he was seen by cardiology earlier today and they are planning a heart catheterization.  Currently denies shortness  of breath, abdominal pain, nausea or vomiting, diarrhea or constipation, dizziness or headache. Objective:   Vitals:   07/21/20 0700 07/21/20 0800 07/21/20 0900 07/21/20 0906  BP: 128/71 138/71 (!) 151/76   Pulse: 60 (!) 58 87 (!) 59  Resp: 18 15 17    Temp:      TempSrc:      SpO2: 95% 95% 94%   Weight:      Height:       No intake or output data in the 24 hours ending 07/21/20 1017 Filed  Weights   07/20/20 1136  Weight: 124.7 kg    Exam  General: Well developed, well nourished, NAD, appears stated age  HEENT: NCAT, mucous membranes moist.   Cardiovascular: S1 S2 auscultated, RRR, nomurmur  Respiratory: Clear to auscultation bilaterally   Abdomen: Soft, nontender, nondistended, + bowel sounds  Extremities: warm dry without cyanosis clubbing or edema  Neuro: AAOx3, nonfocal  Psych: Normal affect and demeanor with intact judgement and insight   Data Reviewed: I have personally reviewed following labs and imaging studies  CBC: Recent Labs  Lab 07/20/20 1142 07/21/20 0308  WBC 5.8 6.9  HGB 15.9 15.7  HCT 47.5 46.9  MCV 89.8 90.9  PLT 170 172   Basic Metabolic Panel: Recent Labs  Lab 07/20/20 1142  NA 130*  K 4.2  CL 98  CO2 23  GLUCOSE 399*  BUN 11  CREATININE 1.06  CALCIUM 9.6   GFR: Estimated Creatinine Clearance: 98.9 mL/min (by C-G formula based on SCr of 1.06 mg/dL). Liver Function Tests: No results for input(s): AST, ALT, ALKPHOS, BILITOT, PROT, ALBUMIN in the last 168 hours. No results for input(s): LIPASE, AMYLASE in the last 168 hours. No results for input(s): AMMONIA in the last 168 hours. Coagulation Profile: No results for input(s): INR, PROTIME in the last 168 hours. Cardiac Enzymes: No results for input(s): CKTOTAL, CKMB, CKMBINDEX, TROPONINI in the last 168 hours. BNP (last 3 results) No results for input(s): PROBNP in the last 8760 hours. HbA1C: Recent Labs    07/20/20 1142  HGBA1C 10.4*   CBG: Recent Labs  Lab 07/21/20 0119 07/21/20 0740  GLUCAP 279* 220*   Lipid Profile: Recent Labs    07/21/20 0308  CHOL 179  HDL 40*  LDLCALC 111*  TRIG 140  CHOLHDL 4.5   Thyroid Function Tests: No results for input(s): TSH, T4TOTAL, FREET4, T3FREE, THYROIDAB in the last 72 hours. Anemia Panel: No results for input(s): VITAMINB12, FOLATE, FERRITIN, TIBC, IRON, RETICCTPCT in the last 72 hours. Urine analysis:      Component Value Date/Time   COLORURINE YELLOW 07/04/2011 1000   APPEARANCEUR CLEAR 07/04/2011 1000   LABSPEC 1.018 07/04/2011 1000   PHURINE 6.0 07/04/2011 1000   GLUCOSEU NEGATIVE 07/04/2011 1000   HGBUR NEGATIVE 07/04/2011 1000   BILIRUBINUR n 11/01/2012 0829   KETONESUR NEGATIVE 07/04/2011 1000   PROTEINUR n 11/01/2012 0829   PROTEINUR NEGATIVE 07/04/2011 1000   UROBILINOGEN 0.2 11/01/2012 0829   UROBILINOGEN 1.0 07/04/2011 1000   NITRITE n 11/01/2012 0829   NITRITE NEGATIVE 07/04/2011 1000   LEUKOCYTESUR Negative 11/01/2012 0829   Sepsis Labs: @LABRCNTIP (procalcitonin:4,lacticidven:4)  ) Recent Results (from the past 240 hour(s))  SARS Coronavirus 2 by RT PCR (hospital order, performed in [facility] hospital lab) Nasopharyngeal Nasopharyngeal Swab     Status: None   Collection Time: 07/21/20  1:17 AM   Specimen: Nasopharyngeal Swab  Result Value Ref Range Status   SARS Coronavirus 2 NEGATIVE NEGATIVE Final  Comment: (NOTE) SARS-CoV-2 target nucleic acids are NOT DETECTED.  The SARS-CoV-2 RNA is generally detectable in upper and lower respiratory specimens during the acute phase of infection. The lowest concentration of SARS-CoV-2 viral copies this assay can detect is 250 copies / mL. A negative result does not preclude SARS-CoV-2 infection and should not be used as the sole basis for treatment or other patient management decisions.  A negative result may occur with improper specimen collection / handling, submission of specimen other than nasopharyngeal swab, presence of viral mutation(s) within the areas targeted by this assay, and inadequate number of viral copies (<250 copies / mL). A negative result must be combined with clinical observations, patient history, and epidemiological information.  Fact Sheet for Patients:   BoilerBrush.com.cy  Fact Sheet for Healthcare Providers: https://pope.com/  This test is  not yet approved or  cleared by the [place] FDA and has been authorized for detection and/or diagnosis of SARS-CoV-2 by FDA under an Emergency Use Authorization (EUA).  This EUA will remain in effect (meaning this test can be used) for the duration of the COVID-19 declaration under Section 564(b)(1) of the Act, 21 U.S.C. section 360bbb-3(b)(1), unless the authorization is terminated or revoked sooner.  Performed at [facility], [address], [place], [place] [address]       Radiology Studies: DG Chest 2 View  Result Date: 07/20/2020 CLINICAL DATA:  Chest pain EXAM: CHEST - 2 VIEW COMPARISON:  01/20/2019 FINDINGS: Stable left-sided implanted cardiac device. The heart size and mediastinal contours are within normal limits. Atherosclerotic calcification of the aortic knob. No focal airspace consolidation, pleural effusion, or pneumothorax. The visualized skeletal structures are unremarkable. IMPRESSION: No active cardiopulmonary disease. Electronically Signed   By: Duanne Guess D.O.   On: 07/20/2020 12:14     Scheduled Meds: . aspirin EC  81 mg Oral Daily  . aspirin  325 mg Oral Daily  . atorvastatin  80 mg Oral Daily  . insulin aspart  0-20 Units Subcutaneous TID WC  . insulin aspart  0-5 Units Subcutaneous QHS  . metoprolol tartrate  25 mg Oral BID   Continuous Infusions: . sodium chloride 100 mL/hr at 07/21/20 0312  . sodium chloride     Followed by  . sodium chloride    . heparin 1,600 Units/hr (07/21/20 0411)     LOS: 1 day   Time Spent in minutes   45 minutes  Eliyanah Elgersma D.O. on 07/21/2020 at 10:17 AM  Between 7am to 7pm - Please see pager noted on [email or web address]  After 7pm go to [email or web address]  And look for the night coverage person covering for me after hours  [facility] Office  [phone]

## 2020-07-21 NOTE — Progress Notes (Signed)
ANTICOAGULATION CONSULT NOTE  Pharmacy Consult for heparin Indication: chest pain/ACS  Allergies  Allergen Reactions  . Losartan Potassium-Hctz Other (See Comments)    Caused dizziness & fatigue  . Prednisone Other (See Comments)    Raises blood sugar  . Doxycycline Rash and Nausea Only  . Lisinopril Rash    Patient Measurements: Height: 6\' 3"  (190.5 cm) Weight: 124.7 kg (275 lb) IBW/kg (Calculated) : 84.5 Heparin Dosing Weight: 111.4kg  Vital Signs: BP: 133/65 (08/18 0300) Pulse Rate: 58 (08/18 0300)  Labs: Recent Labs    07/20/20 1142 07/20/20 1538 07/20/20 2238 07/21/20 0308  HGB 15.9  --   --  15.7  HCT 47.5  --   --  46.9  PLT 170  --   --  172  HEPARINUNFRC  --   --   --  0.26*  CREATININE 1.06  --   --   --   TROPONINIHS 34* 258* 359*  --     Estimated Creatinine Clearance: 98.9 mL/min (by C-G formula based on SCr of 1.06 mg/dL).  Assessment: 64 y.o. male with chest pain for heparin   Goal of Therapy:  Heparin level 0.3-0.7 units/ml Monitor platelets by anticoagulation protocol: Yes   Plan:  Increase Heparin 1600 units/hr Follow up after cath today   76 07/21/2020,3:53 AM

## 2020-07-21 NOTE — ED Notes (Signed)
Cards just left out of pts room stating they plan to cath him. Pt is NPO.

## 2020-07-21 NOTE — Progress Notes (Signed)
ANTICOAGULATION CONSULT NOTE  Pharmacy Consult:  Heparin Indication: chest pain/ACS  Allergies  Allergen Reactions  . Losartan Potassium-Hctz Other (See Comments)    Caused dizziness & fatigue  . Prednisone Other (See Comments)    Raises blood sugar  . Doxycycline Rash and Nausea Only  . Lisinopril Rash    Patient Measurements: Height: 6\' 3"  (190.5 cm) Weight: 124.7 kg (275 lb) IBW/kg (Calculated) : 84.5 Heparin Dosing Weight: 111.4kg  Vital Signs: BP: 129/71 (08/18 1000) Pulse Rate: 61 (08/18 1000)  Labs: Recent Labs    07/20/20 1142 07/20/20 1142 07/20/20 1538 07/20/20 2238 07/21/20 0308 07/21/20 1031  HGB 15.9  --   --   --  15.7  --   HCT 47.5  --   --   --  46.9  --   PLT 170  --   --   --  172  --   HEPARINUNFRC  --   --   --   --  0.26* 0.45  CREATININE 1.06  --   --   --   --   --   TROPONINIHS 34*   < > 258* 359* 243*  --    < > = values in this interval not displayed.    Estimated Creatinine Clearance: 98.9 mL/min (by C-G formula based on SCr of 1.06 mg/dL).  Assessment: 64 y.o. male presented with chest pain to continue on IV heparin.  Heparin level is therapeutic; no bleeding reported.  Goal of Therapy:  Heparin level 0.3-0.7 units/ml Monitor platelets by anticoagulation protocol: Yes   Plan:  Continue heparin gtt at 1600 units/hr Daily heparin level and CBC F/U post cath  Jamarien Rodkey D. [birth year]-10-1984, PharmD, BCPS, BCCCP 07/21/2020, 12:13 PM

## 2020-07-21 NOTE — Progress Notes (Addendum)
 Progress Note  Patient Name: Paul Paul Date of Encounter: 07/21/2020  [facility] Cardiologist: No primary care provider on file.   Subjective   Admitted overnight for chest pain and ruled in for NSTEMI. Currently chest pain free. He notes intermittent chest pain over the past month, but more so over the past week, that he thought was just gas. However, yesterday he had significant chest pain that felt like "elephant on his chest" after exercising and going to yoga. He has some associated bilateral arm discomfort as well. Pain lasted for about 20 minutes and then resolved on its own.  Inpatient Medications    Scheduled Meds: . aspirin EC  81 mg Oral Daily  . aspirin  325 mg Oral Daily  . atorvastatin  80 mg Oral Daily  . insulin aspart  0-20 Units Subcutaneous TID WC  . insulin aspart  0-5 Units Subcutaneous QHS  . metoprolol tartrate  25 mg Oral BID   Continuous Infusions: . sodium chloride 100 mL/hr at 07/21/20 0312  . heparin 1,600 Units/hr (07/21/20 0411)   PRN Meds: acetaminophen, ALPRAZolam, ondansetron (ZOFRAN) IV   Vital Signs    Vitals:   07/21/20 0300 07/21/20 0400 07/21/20 0500 07/21/20 0600  BP: 133/65 (!) 143/71 137/73 (!) 142/69  Pulse: (!) 58 60 60 (!) 59  Resp: 20 20 15 15  Temp:      TempSrc:      SpO2: 93% 94% 93% 92%  Weight:      Height:       No intake or output data in the 24 hours ending 07/21/20 0642 Last 3 Weights 07/20/2020 10/02/2019 10/02/2019  Weight (lbs) 275 lb 275 lb 3.2 oz 275 lb 3.2 oz  Weight (kg) 124.739 kg 124.83 kg 124.83 kg      Telemetry    Normal sinus rhythm. Rates currently in the 60's but were initially in the 90's to 110's when he first came into the ED. - Personally Reviewed  ECG    No new ECG tracing today. - Personally Reviewed  Physical Exam   GEN: No acute distress.   Neck: No JVD Cardiac: RRR. No murmurs, rubs, or gallops. Radial and distal pedal pulses 2+ and equal bilaterally. Respiratory: Clear  to auscultation bilaterally. No wheezes, rhonchi, or rales. GI: Soft, non-tender, non-distended  MS: No lower extremity edema. No deformity. Neuro:  No focal deficits. Psych: Normal affect. Responds appropriately.  Labs    High Sensitivity Troponin:   Recent Labs  Lab 07/20/20 1142 07/20/20 1538 07/20/20 2238 07/21/20 0308  TROPONINIHS 34* 258* 359* 243*      Chemistry Recent Labs  Lab 07/20/20 1142  NA 130*  K 4.2  CL 98  CO2 23  GLUCOSE 399*  BUN 11  CREATININE 1.06  CALCIUM 9.6  GFRNONAA >60  GFRAA >60  ANIONGAP 9     Hematology Recent Labs  Lab 07/20/20 1142 07/21/20 0308  WBC 5.8 6.9  RBC 5.29 5.16  HGB 15.9 15.7  HCT 47.5 46.9  MCV 89.8 90.9  MCH 30.1 30.4  MCHC 33.5 33.5  RDW 12.8 13.1  PLT 170 172    BNPNo results for input(s): BNP, PROBNP in the last 168 hours.   DDimer No results for input(s): DDIMER in the last 168 hours.   Radiology    DG Chest 2 View  Result Date: 07/20/2020 CLINICAL DATA:  Chest pain EXAM: CHEST - 2 VIEW COMPARISON:  01/20/2019 FINDINGS: Stable left-sided implanted cardiac device. The heart   size and mediastinal contours are within normal limits. Atherosclerotic calcification of the aortic knob. No focal airspace consolidation, pleural effusion, or pneumothorax. The visualized skeletal structures are unremarkable. IMPRESSION: No active cardiopulmonary disease. Electronically Signed   By: Duanne Guess D.O.   On: 07/20/2020 12:14    Cardiac Studies   Echo ordered.  Patient Profile     64 y.o. male with a history of normal coronaries on cath in 2012, symptomatic bradycardia s/p St. Jude PPM, hypertension, type 2 diabetes mellitus, prior cerebral aneurysm, and obesity who is being seen today for evaluation of chest pain at the request of Talmage Nap, PA-C in the Emergency Department.  Assessment & Plan    NSTEMI - Patient presented with typical anginal chest pain. - EKG showed sinus rhythm with 1st degree AV  block and T wave inversions in leads III and aVF. Unchanged from prior tracing in 12/2018. - High-sensitivity troponin 258 >> 359 >> 243.  - Echo pending.  - Currently chest pain free. - Continue IV Heparin.  - Continue Aspirin. Started on Lopressor 25mg  twice daily and Lipitor 80mg  daily. - Given typical sounding chest pain and multiple CV risk factors including HTN, HLD, and uncontrolled DM, suspect patient will need cath. The patient understands that risks include but are not limited to stroke (1 in 1000), death (1 in 1000), kidney failure [usually temporary] (1 in 500), bleeding (1 in 200), allergic reaction [possibly serious] (1 in 200), and agrees to proceed.   Hypertension - BP 178/85 on arrival to the ED. Still mildly elevated but has improved. - Started on Lopressor 25mg  twice daily given NSTEMI. - Can start Losartan 25mg  daily after cath for additional BP control and for renal protection given diabetes.   Hyperlipidemia - Lipid panel this admission: Total Cholesterol 179, Triglycerides 140, HDL 40, LDL 111.  - Crestor 5mg  listed under PTA medications but it does not look like patient has been taking this. Started on Lipitor 80mg  daily here.   Uncontrolled Type 2 Diabetes - Hemoglobin A1c 10.4 this admission.  - On Glimepiride at home. - Management per primary team.  Symptomatic Bradycardia - S/p St. Jude PPM.  - Followed by Dr. .  Obstructive Sleep Apnea - Not currently on CPAP at home because his machine broke.      For questions or updates, please contact [facility] Please consult [email or web address] for contact info under        Signed, , PA-C  07/21/2020, 6:42 AM     Patient seen and examined.  Agree with above documentation.  On exam, patient is alert and oriented, regular rate and rhythm, no murmurs, lungs CTAB, no LE edema or JVD.  Telemetry reviewed, shows atrial paced rhythm at 60 bpm.  High-sensitivity troponin peaked 359.  EKG shows  normal sinus rhythm, first-degree AV block, subtle ST depressions along with T wave inversions in leads III, aVF.  He is currently chest pain-free.  For his NSTEMI, agree with proceeding with cath. Risks and benefits of cardiac catheterization have been discussed with the patient.  These include bleeding, infection, kidney damage, stroke, heart attack, death.  The patient understands these risks and is willing to proceed.  , MD

## 2020-07-21 NOTE — H&P (View-Only) (Signed)
Progress Note  Patient Name: Paul Paul Date of Encounter: 07/21/2020  [facility] Cardiologist: No primary care provider on file.   Subjective   Admitted overnight for chest pain and ruled in for NSTEMI. Currently chest pain free. He notes intermittent chest pain over the past month, but more so over the past week, that he thought was just gas. However, yesterday he had significant chest pain that felt like "elephant on his chest" after exercising and going to yoga. He has some associated bilateral arm discomfort as well. Pain lasted for about 20 minutes and then resolved on its own.  Inpatient Medications    Scheduled Meds: . aspirin EC  81 mg Oral Daily  . aspirin  325 mg Oral Daily  . atorvastatin  80 mg Oral Daily  . insulin aspart  0-20 Units Subcutaneous TID WC  . insulin aspart  0-5 Units Subcutaneous QHS  . metoprolol tartrate  25 mg Oral BID   Continuous Infusions: . sodium chloride 100 mL/hr at 07/21/20 0312  . heparin 1,600 Units/hr (07/21/20 0411)   PRN Meds: acetaminophen, ALPRAZolam, ondansetron (ZOFRAN) IV   Vital Signs    Vitals:   07/21/20 0300 07/21/20 0400 07/21/20 0500 07/21/20 0600  BP: 133/65 (!) 143/71 137/73 (!) 142/69  Pulse: (!) 58 60 60 (!) 59  Resp: 20 20 15 15   Temp:      TempSrc:      SpO2: 93% 94% 93% 92%  Weight:      Height:       No intake or output data in the 24 hours ending 07/21/20 0642 Last 3 Weights 07/20/2020 10/02/2019 10/02/2019  Weight (lbs) 275 lb 275 lb 3.2 oz 275 lb 3.2 oz  Weight (kg) 124.739 kg 124.83 kg 124.83 kg      Telemetry    Normal sinus rhythm. Rates currently in the 60's but were initially in the 90's to 110's when he first came into the ED. - Personally Reviewed  ECG    No new ECG tracing today. - Personally Reviewed  Physical Exam   GEN: No acute distress.   Neck: No JVD Cardiac: RRR. No murmurs, rubs, or gallops. Radial and distal pedal pulses 2+ and equal bilaterally. Respiratory: Clear  to auscultation bilaterally. No wheezes, rhonchi, or rales. GI: Soft, non-tender, non-distended  MS: No lower extremity edema. No deformity. Neuro:  No focal deficits. Psych: Normal affect. Responds appropriately.  Labs    High Sensitivity Troponin:   Recent Labs  Lab 07/20/20 1142 07/20/20 1538 07/20/20 2238 07/21/20 0308  TROPONINIHS 34* 258* 359* 243*      Chemistry Recent Labs  Lab 07/20/20 1142  NA 130*  K 4.2  CL 98  CO2 23  GLUCOSE 399*  BUN 11  CREATININE 1.06  CALCIUM 9.6  GFRNONAA >60  GFRAA >60  ANIONGAP 9     Hematology Recent Labs  Lab 07/20/20 1142 07/21/20 0308  WBC 5.8 6.9  RBC 5.29 5.16  HGB 15.9 15.7  HCT 47.5 46.9  MCV 89.8 90.9  MCH 30.1 30.4  MCHC 33.5 33.5  RDW 12.8 13.1  PLT 170 172    BNPNo results for input(s): BNP, PROBNP in the last 168 hours.   DDimer No results for input(s): DDIMER in the last 168 hours.   Radiology    DG Chest 2 View  Result Date: 07/20/2020 CLINICAL DATA:  Chest pain EXAM: CHEST - 2 VIEW COMPARISON:  01/20/2019 FINDINGS: Stable left-sided implanted cardiac device. The heart  size and mediastinal contours are within normal limits. Atherosclerotic calcification of the aortic knob. No focal airspace consolidation, pleural effusion, or pneumothorax. The visualized skeletal structures are unremarkable. IMPRESSION: No active cardiopulmonary disease. Electronically Signed   By: Duanne Guess D.O.   On: 07/20/2020 12:14    Cardiac Studies   Echo ordered.  Patient Profile     64 y.o. male with a history of normal coronaries on cath in 2012, symptomatic bradycardia s/p St. Jude PPM, hypertension, type 2 diabetes mellitus, prior cerebral aneurysm, and obesity who is being seen today for evaluation of chest pain at the request of Talmage Nap, PA-C in the Emergency Department.  Assessment & Plan    NSTEMI - Patient presented with typical anginal chest pain. - EKG showed sinus rhythm with 1st degree AV  block and T wave inversions in leads III and aVF. Unchanged from prior tracing in 12/2018. - High-sensitivity troponin 258 >> 359 >> 243.  - Echo pending.  - Currently chest pain free. - Continue IV Heparin.  - Continue Aspirin. Started on Lopressor 25mg  twice daily and Lipitor 80mg  daily. - Given typical sounding chest pain and multiple CV risk factors including HTN, HLD, and uncontrolled DM, suspect patient will need cath. The patient understands that risks include but are not limited to stroke (1 in 1000), death (1 in 1000), kidney failure [usually temporary] (1 in 500), bleeding (1 in 200), allergic reaction [possibly serious] (1 in 200), and agrees to proceed.   Hypertension - BP 178/85 on arrival to the ED. Still mildly elevated but has improved. - Started on Lopressor 25mg  twice daily given NSTEMI. - Can start Losartan 25mg  daily after cath for additional BP control and for renal protection given diabetes.   Hyperlipidemia - Lipid panel this admission: Total Cholesterol 179, Triglycerides 140, HDL 40, LDL 111.  - Crestor 5mg  listed under PTA medications but it does not look like patient has been taking this. Started on Lipitor 80mg  daily here.   Uncontrolled Type 2 Diabetes - Hemoglobin A1c 10.4 this admission.  - On Glimepiride at home. - Management per primary team.  Symptomatic Bradycardia - S/p St. Jude PPM.  - Followed by Dr. .  Obstructive Sleep Apnea - Not currently on CPAP at home because his machine broke.      For questions or updates, please contact [facility] Please consult [email or web address] for contact info under        Signed, , PA-C  07/21/2020, 6:42 AM     Patient seen and examined.  Agree with above documentation.  On exam, patient is alert and oriented, regular rate and rhythm, no murmurs, lungs CTAB, no LE edema or JVD.  Telemetry reviewed, shows atrial paced rhythm at 60 bpm.  High-sensitivity troponin peaked 359.  EKG shows  normal sinus rhythm, first-degree AV block, subtle ST depressions along with T wave inversions in leads III, aVF.  He is currently chest pain-free.  For his NSTEMI, agree with proceeding with cath. Risks and benefits of cardiac catheterization have been discussed with the patient.  These include bleeding, infection, kidney damage, stroke, heart attack, death.  The patient understands these risks and is willing to proceed.  , MD

## 2020-07-21 NOTE — ED Notes (Signed)
Ordered a hospital bed--Brandonn Capelli  

## 2020-07-21 NOTE — Interval H&P Note (Signed)
Cath Lab Visit (complete for each Cath Lab visit)  Clinical Evaluation Leading to the Procedure:   ACS: Yes.    Non-ACS:  n/a    History and Physical Interval Note:  07/21/2020 2:34 PM  Paul Paul  has presented today for surgery, with the diagnosis of nstemi.  The various methods of treatment have been discussed with the patient and family. After consideration of risks, benefits and other options for treatment, the patient has consented to  Procedure(s): LEFT HEART CATH AND CORONARY ANGIOGRAPHY (N/A) as a surgical intervention.  The patient's history has been reviewed, patient examined, no change in status, stable for surgery.  I have reviewed the patient's chart and labs.  Questions were answered to the patient's satisfaction.     Lorine Bears

## 2020-07-21 NOTE — Progress Notes (Signed)
  Echocardiogram 2D Echocardiogram has been performed.  Paul Paul 07/21/2020, 11:43 AM

## 2020-07-22 ENCOUNTER — Telehealth: Payer: Self-pay | Admitting: Internal Medicine

## 2020-07-22 ENCOUNTER — Encounter (HOSPITAL_COMMUNITY): Payer: Self-pay | Admitting: Cardiovascular Disease

## 2020-07-22 DIAGNOSIS — F411 Generalized anxiety disorder: Secondary | ICD-10-CM

## 2020-07-22 LAB — BASIC METABOLIC PANEL
Anion gap: 8 (ref 5–15)
BUN: 11 mg/dL (ref 8–23)
CO2: 22 mmol/L (ref 22–32)
Calcium: 9.2 mg/dL (ref 8.9–10.3)
Chloride: 105 mmol/L (ref 98–111)
Creatinine, Ser: 0.99 mg/dL (ref 0.61–1.24)
GFR calc Af Amer: >60 mL/min (ref >60)
GFR calc non Af Amer: >60 mL/min (ref >60)
Glucose, Bld: 205 mg/dL — ABNORMAL HIGH (ref 70–99)
Potassium: 4.1 mmol/L (ref 3.5–5.1)
Sodium: 135 mmol/L (ref 135–145)

## 2020-07-22 LAB — CBC
HCT: 47.9 % (ref 39.0–52.0)
Hemoglobin: 16.1 g/dL (ref 13.0–17.0)
MCH: 31.1 pg (ref 26.0–34.0)
MCHC: 33.6 g/dL (ref 30.0–36.0)
MCV: 92.6 fL (ref 80.0–100.0)
Platelets: 148 10*3/uL — ABNORMAL LOW (ref 150–400)
RBC: 5.17 MIL/uL (ref 4.22–5.81)
RDW: 13.3 % (ref 11.5–15.5)
WBC: 6.5 10*3/uL (ref 4.0–10.5)
nRBC: 0 % (ref 0.0–0.2)

## 2020-07-22 LAB — GLUCOSE, CAPILLARY: Glucose-Capillary: 186 mg/dL — ABNORMAL HIGH (ref 70–99)

## 2020-07-22 MED ORDER — ATORVASTATIN CALCIUM 80 MG PO TABS
80.0000 mg | ORAL_TABLET | Freq: Every day | ORAL | 1 refills | Status: DC
Start: 2020-07-23 — End: 2020-08-17

## 2020-07-22 MED ORDER — TICAGRELOR 90 MG PO TABS
90.0000 mg | ORAL_TABLET | Freq: Two times a day (BID) | ORAL | 1 refills | Status: DC
Start: 2020-07-22 — End: 2020-08-17

## 2020-07-22 MED ORDER — METOPROLOL TARTRATE 25 MG PO TABS
25.0000 mg | ORAL_TABLET | Freq: Two times a day (BID) | ORAL | 1 refills | Status: DC
Start: 2020-07-22 — End: 2020-08-17

## 2020-07-22 MED FILL — METOPROLOL TARTRATE 25 MG T: 25 | 30 days supply | Qty: 60 | Fill #0

## 2020-07-22 MED FILL — BRILINTA 90 MG TABLET: 90 | 30 days supply | Qty: 60 | Fill #0

## 2020-07-22 MED FILL — ATORVASTATIN CALCIUM 80 MG: 80 | 30 days supply | Qty: 30 | Fill #0

## 2020-07-22 MED FILL — Verapamil HCl IV Soln 2.5 MG/ML: INTRAVENOUS | Qty: 2 | Status: AC

## 2020-07-22 MED FILL — Nitroglycerin IV Soln 100 MCG/ML in D5W: INTRA_ARTERIAL | Qty: 10 | Status: AC

## 2020-07-22 NOTE — Progress Notes (Signed)
CARDIAC REHAB PHASE I   PRE:  Rate/Rhythm: 60 pacing    BP: sitting 125/73    SaO2: 97 RA  MODE:  Ambulation: 470 ft   POST:  Rate/Rhythm: 98 pacing    BP: sitting 98/69, recheck 135/66     SaO2:   Tolerated well, no c/o. Discussed MI, stent, Brilinta, diet, exercise, NTG, and CRPII. Pt is receptive. Reinforced importance of DM control as pt does not watch his diet but doesn't like meds. He is exercising daily and plans to return to gym. Will hold weight training until he follows up with MD. Will refer to [place] CRPII but not very interested. Understands importance of Brilinta. [id number]   Harriet Masson CES, ACSM 07/22/2020 9:12 AM

## 2020-07-22 NOTE — Progress Notes (Signed)
Progress Note  Patient Name: Paul Paul Date of Encounter: 07/22/2020  [facility] Cardiologist: Dr Izora Ribas  Subjective   No CP or dyspnea  Inpatient Medications    Scheduled Meds: . aspirin  81 mg Oral Daily  . atorvastatin  80 mg Oral Daily  . enoxaparin (LOVENOX) injection  40 mg Subcutaneous Q24H  . insulin aspart  0-20 Units Subcutaneous TID WC  . insulin aspart  0-5 Units Subcutaneous QHS  . metoprolol tartrate  25 mg Oral BID  . sodium chloride flush  3 mL Intravenous Q12H  . ticagrelor  90 mg Oral BID   Continuous Infusions: . sodium chloride 100 mL/hr at 07/21/20 0312  . sodium chloride     PRN Meds: sodium chloride, acetaminophen, ALPRAZolam, ondansetron (ZOFRAN) IV, sodium chloride flush   Vital Signs    Vitals:   07/21/20 2355 07/22/20 0005 07/22/20 0425 07/22/20 0650  BP: (!) 155/68     Pulse: 66     Resp: 18  18   Temp: 98.2 F (36.8 C)  98.1 F (36.7 C)   TempSrc: Oral  Oral   SpO2: 97% 98%    Weight:    124.7 kg  Height:        Intake/Output Summary (Last 24 hours) at 07/22/2020 0757 Last data filed at 07/21/2020 2045 Gross per 24 hour  Intake 1474 ml  Output --  Net 1474 ml   Last 3 Weights 07/22/2020 07/20/2020 10/02/2019  Weight (lbs) 274 lb 14.4 oz 275 lb 275 lb 3.2 oz  Weight (kg) 124.694 kg 124.739 kg 124.83 kg      Telemetry    Sinus with intermittent atrial pacing- Personally Reviewed  Physical Exam   GEN: No acute distress.   Neck: No JVD Cardiac: RRR, no murmurs, rubs, or gallops.  Respiratory: Clear to auscultation bilaterally. GI: Soft, nontender, non-distended  MS: No edema; radial cath site with no hematoma Neuro:  Nonfocal  Psych: Normal affect   Labs    High Sensitivity Troponin:   Recent Labs  Lab 07/20/20 1142 07/20/20 1538 07/20/20 2238 07/21/20 0308  TROPONINIHS 34* 258* 359* 243*      Chemistry Recent Labs  Lab 07/20/20 1142  NA 130*  K 4.2  CL 98  CO2 23  GLUCOSE 399*  BUN 11   CREATININE 1.06  CALCIUM 9.6  GFRNONAA >60  GFRAA >60  ANIONGAP 9     Hematology Recent Labs  Lab 07/20/20 1142 07/21/20 0308 07/22/20 0421  WBC 5.8 6.9 6.5  RBC 5.29 5.16 5.17  HGB 15.9 15.7 16.1  HCT 47.5 46.9 47.9  MCV 89.8 90.9 92.6  MCH 30.1 30.4 31.1  MCHC 33.5 33.5 33.6  RDW 12.8 13.1 13.3  PLT 170 172 148*    Radiology    DG Chest 2 View  Result Date: 07/20/2020 CLINICAL DATA:  Chest pain EXAM: CHEST - 2 VIEW COMPARISON:  01/20/2019 FINDINGS: Stable left-sided implanted cardiac device. The heart size and mediastinal contours are within normal limits. Atherosclerotic calcification of the aortic knob. No focal airspace consolidation, pleural effusion, or pneumothorax. The visualized skeletal structures are unremarkable. IMPRESSION: No active cardiopulmonary disease. Electronically Signed   By: Duanne Guess D.O.   On: 07/20/2020 12:14   CARDIAC CATHETERIZATION  Result Date: 07/21/2020  1st Diag lesion is 60% stenosed.  Mid LAD lesion is 95% stenosed.  Post intervention, there is a 5% residual stenosis.  A drug-eluting stent was successfully placed using a STENT RESOLUTE ONYX 2.5X26.  Dist LAD lesion is 40% stenosed.  1.  Severe one-vessel coronary artery disease with 95% moderately calcified stenosis in the mid LAD which seems to be the culprit for non-ST elevation myocardial infarction.  The RCA is heavily calcified with mild diffuse disease.  2.  Mildly elevated left ventricular end-diastolic pressure at 15 mmHg. 3.  Successful angioplasty and drug-eluting stent placement to the mid LAD. Recommendations: Dual antiplatelet therapy for at least 1 year. Aggressive treatment of risk factors. Likely discharge home tomorrow if no issues.   ECHOCARDIOGRAM COMPLETE  Result Date: 07/21/2020    ECHOCARDIOGRAM REPORT   Patient Name:   Paul Paul Date of Exam: 07/21/2020 Medical Rec #:  [id number]     Height:       75.0 in Accession #:    [id number]    Weight:       275.0  lb Date of Birth:  16-Feb-1956      BSA:          2.512 m Patient Age:    64 years      BP:           129/71 mmHg Patient Gender: M             HR:           61 bpm. Exam Location:  Inpatient Procedure: 2D Echo Indications:    I24.9 acute coronary syndrome  History:        Patient has no prior history of Echocardiogram examinations.                 Pacemaker; Risk Factors:Diabetes and Hypertension.  Sonographer:    Celene Skeen RDCS (AE) Referring Phys: [id number] [facility]  Sonographer Comments: Patient is morbidly obese. Image acquisition challenging due to patient body habitus. IMPRESSIONS  1. Left ventricular ejection fraction, by estimation, is 55 to 60%. The left ventricle has normal function. The left ventricle has no regional wall motion abnormalities. Left ventricular diastolic parameters were normal.  2. Pacing wires in RA/RV. Right ventricular systolic function is normal. The right ventricular size is normal.  3. The mitral valve is normal in structure. No evidence of mitral valve regurgitation. No evidence of mitral stenosis.  4. The aortic valve was not well visualized. Aortic valve regurgitation is not visualized. No aortic stenosis is present.  5. The inferior vena cava is normal in size with greater than 50% respiratory variability, suggesting right atrial pressure of 3 mmHg. FINDINGS  Left Ventricle: Left ventricular ejection fraction, by estimation, is 55 to 60%. The left ventricle has normal function. The left ventricle has no regional wall motion abnormalities. The left ventricular internal cavity size was normal in size. There is  no left ventricular hypertrophy. Left ventricular diastolic parameters were normal. Right Ventricle: Pacing wires in RA/RV. The right ventricular size is normal. No increase in right ventricular wall thickness. Right ventricular systolic function is normal. Left Atrium: Left atrial size was normal in size. Right Atrium: Right atrial size was normal in size.  Pericardium: There is no evidence of pericardial effusion. Mitral Valve: The mitral valve is normal in structure. Normal mobility of the mitral valve leaflets. No evidence of mitral valve regurgitation. No evidence of mitral valve stenosis. Tricuspid Valve: The tricuspid valve is normal in structure. Tricuspid valve regurgitation is not demonstrated. No evidence of tricuspid stenosis. Aortic Valve: The aortic valve was not well visualized. Aortic valve regurgitation is not visualized. No aortic stenosis is present. Pulmonic Valve: The  pulmonic valve was normal in structure. Pulmonic valve regurgitation is not visualized. No evidence of pulmonic stenosis. Aorta: The aortic root is normal in size and structure. Venous: The inferior vena cava is normal in size with greater than 50% respiratory variability, suggesting right atrial pressure of 3 mmHg. IAS/Shunts: No atrial level shunt detected by color flow Doppler.  LEFT VENTRICLE PLAX 2D LVIDd:         4.40 cm  Diastology LVIDs:         2.80 cm  LV e' lateral:   12.10 cm/s LV PW:         1.10 cm  LV E/e' lateral: 7.1 LV IVS:        0.90 cm  LV e' medial:    6.85 cm/s LVOT diam:     2.30 cm  LV E/e' medial:  12.6 LV SV:         62 LV SV Index:   25 LVOT Area:     4.15 cm  LEFT ATRIUM           Index LA diam:      3.60 cm 1.43 cm/m LA Vol (A2C): 73.2 ml 29.14 ml/m  AORTIC VALVE LVOT Vmax:   74.40 cm/s LVOT Vmean:  54.700 cm/s LVOT VTI:    0.149 m  AORTA Ao Root diam: 3.40 cm MITRAL VALVE MV Area (PHT): 2.69 cm    SHUNTS MV Decel Time: 282 msec    Systemic VTI:  0.15 m MV E velocity: 86.10 cm/s  Systemic Diam: 2.30 cm MV A velocity: 87.40 cm/s MV E/A ratio:  0.99 Charlton Haws MD Electronically signed by Charlton Haws MD Signature Date/Time: 07/21/2020/12:26:18 PM    Final     Patient Profile     64 y.o. male with past medical history diabetes mellitus, hypertension, prior pacemaker, prior cerebral aneurysm admitted with non-ST elevation myocardial infarction.   Echocardiogram showed normal LV function.  Cardiac catheterization revealed 60% first diagonal, 95% mid LAD.  Patient had PCI of the LAD with drug-eluting stent.  Assessment & Plan    1 non-ST elevation myocardial infarction-patient doing well status post PCI of LAD.  LV function is normal.  Plan to continue aspirin, Brilinta and statin.  Continue metoprolol.  2 hypertension-we will continue metoprolol at present dose.  Follow blood pressure as an outpatient and adjust regimen as needed.  3 hyperlipidemia-continue statin.  Check lipids and liver in 12 weeks.  4 status post pacemaker-follow-up Dr. Ladona Ridgel after discharge.  5 diabetes mellitus-hemoglobin A1c greater than 10.  Needs close follow-up with primary care for medication adjustment.  Patient can be discharged on present medications.  We will arrange transition of care appointment with APP in 2 weeks.  Follow-up Dr Izora Ribas 3 months.  For questions or updates, please contact [facility] Please consult [email or web address] for contact info under        Signed, Olga Millers, MD  07/22/2020, 7:57 AM

## 2020-07-22 NOTE — Discharge Summary (Signed)
Physician Discharge Summary  Paul Paul VFI:[id number] DOB: 1956-11-01 DOA: 07/20/2020  PCP: Shelva Majestic, MD  Admit date: 07/20/2020 Discharge date: 07/22/2020  Time spent: 45 minutes  Recommendations for Outpatient Follow-up:  Patient will be discharged to home.  Patient will need to follow up with primary care provider within one week of discharge, repeat CBC and BMP, discuss better diabetes management.  Follow-up with cardiology in 2 weeks.  Patient should continue medications as prescribed.  Patient should follow a heart healthy/carb modified diet.   Discharge Diagnoses:  NSTEMI Essential hypertension Diabetes mellitus type II, uncontrolled with hyperglycemia Morbid obesity Obstructive sleep apnea History of sick sinus syndrome Hyperlipidemia General anxiety disorder  Discharge Condition: Stable  Diet recommendation: heart healthy/carb modified  Filed Weights   07/20/20 1136 07/22/20 0650  Weight: 124.7 kg 124.7 kg    History of present illness:  on 07/21/2020 by Dr. Ledell Noss Rogersis a 64 y.o.malewith medical history significant ofsick sinus syndrome status post pacemaker placement, anxiety disorder, morbid obesity, diabetes, cerebral aneurysm and obstructive sleep apnea not on CPAP presenting with chest pain. Chest pain started today rated as 7 out of 10 in the mid chest. Associated with mild shortness of breath. No cough no wheeze. Patient took his medications this only beta-blocker and aspirin. No nitroglycerin at home. He continues to have significant chest pain so he came to the ER. In the ER he was noted to have 10 out of 10 pain in the beginning given nitroglycerin aspirin. No radiation of his chest pain. Mild shortness of breath in the beginning which has cleared. Patient otherwise is now chest pain-free. He denies significant nausea and vomiting. Initial enzymes rising from 34-2 58 and then 359 patient therefore appears to have non-ST  elevation MI and is being admitted to the hospital for evaluation and treatment. Cardiology has been consulted.  Hospital Course:  NSTEMI -Presented with typical anginal type chest pain -EKG reviewed and shows sinus rhythm with a first-degree AV block and T wave inversions in leads AVF and III -High-sensitivity troponin peaked at 359, however down to 243 -Was placed on IV heparin -Patient currently denies chest pain -Echocardiogram EF 55-60.  No regional wall motion abnormalities.  Pacing wires in RA/RV. -Patient was started on metoprolol, statin and aspirin -Cardiology consulted and appreciated -Has post left heart catheterization coronary angiography showing severe one-vessel coronary disease with 95% moderately calcified stenosis in the mid LAD, RCA is highly calcified with mild diffuse disease.  Mildly elevated left ventricular end-diastolic pressure 15 mmHg.  Successful angioplasty with drug-eluting stent placement to the mid LAD.  Recommendations are to continue dual antiplatelet therapy for at least 1 year. -Currently placed on Brilinta -TOC consulted for medication assistance  Mild thrombocytopenia -Platelets today 148 -Repeat CBC in 1 week  Essential hypertension -Started on Lopressor 25 mg twice daily -Per cardiology, start losartan 25 mg after catheterization  Diabetes mellitus type II, uncontrolled with hyperglycemia -Hemoglobin A1c 10.4 -Patient is on glimepiride at home which was held during hospitalization, may resume at discharge -Patient should discuss better hyperglycemic/diabetes control with his PCP  Morbid obesity -Patient to follow-up with PCP to discuss lifestyle modifications  Obstructive sleep apnea -Patient states he was supposed to be on CPAP however his machine broke down and he has not received  History of sick sinus syndrome -Status post pacemaker placement-[employer] -Patient follows with Dr. Ladona Ridgel, EP  Hyperlipidemia -Lipid panel shows  cholesterol 179, triglycerides 140, HDL 40, LDL 111 -Continue  statin  General anxiety disorder -Continue Ativan PRN  Mild hyponatremia -Repeat BMP in 1 week  Consultants Cardiology  Procedures  Cardiogram Left heart catheterization  Discharge Exam: Vitals:   07/22/20 0425 07/22/20 0940  BP:  135/66  Pulse:  65  Resp: 18   Temp: 98.1 F (36.7 C)   SpO2:       General: Well developed, well nourished, NAD, appears stated age  HEENT: NCAT, mucous membranes moist.  Cardiovascular: S1 S2 auscultated, RRR, no murmur  Respiratory: Clear to auscultation bilaterally with equal chest rise  Abdomen: Soft, obese, nontender, nondistended, + bowel sounds  Extremities: warm dry without cyanosis clubbing or edema  Neuro: AAOx3, nonfocal  Psych: Appropriate mood and affet  Discharge Instructions Discharge Instructions    AMB Referral to Cardiac Rehabilitation - Phase II   Complete by: As directed    Diagnosis:  Coronary Stents NSTEMI     After initial evaluation and assessments completed: Virtual Based Care may be provided alone or in conjunction with Phase 2 Cardiac Rehab based on patient barriers.: Yes   Discharge instructions   Complete by: As directed    Patient will be discharged to home.  Patient will need to follow up with primary care provider within one week of discharge, repeat CBC and BMP, discuss better diabetes management.  Follow-up with cardiology in 2 weeks.  Patient should continue medications as prescribed.  Patient should follow a heart healthy/carb modified diet.     Allergies as of 07/22/2020      Reactions   Doxycycline Rash, Nausea Only   Lisinopril Rash   Losartan Potassium-hctz Other (See Comments)   Caused dizziness & fatigue   Prednisone Other (See Comments)   Raises blood sugar      Medication List    STOP taking these medications   ibuprofen 400 MG tablet Commonly known as: ADVIL   metoprolol succinate 100 MG 24 hr tablet Commonly  known as: TOPROL-XL   rosuvastatin 5 MG tablet Commonly known as: Crestor     TAKE these medications   aspirin EC 81 MG tablet Take 81 mg by mouth daily.   atorvastatin 80 MG tablet Commonly known as: LIPITOR Take 1 tablet (80 mg total) by mouth daily. Start taking on: July 23, 2020   glimepiride 4 MG tablet Commonly known as: AMARYL TAKE 2 TABLETS  BY MOUTH DAILY BEFORE BREAKFAST. What changed: See the new instructions.   metoprolol tartrate 25 MG tablet Commonly known as: LOPRESSOR Take 1 tablet (25 mg total) by mouth 2 (two) times daily.   ticagrelor 90 MG Tabs tablet Commonly known as: BRILINTA Take 1 tablet (90 mg total) by mouth 2 (two) times daily.      Allergies  Allergen Reactions  . Doxycycline Rash and Nausea Only  . Lisinopril Rash  . Losartan Potassium-Hctz Other (See Comments)    Caused dizziness & fatigue  . Prednisone Other (See Comments)    Raises blood sugar    Follow-up Information    Shelva Majestic, MD. Schedule an appointment as soon as possible for a visit in 1 week(s).   Specialty: Family Medicine Why: Hospital follow up Contact information: [address] [place] [address] [phone]        Little Ishikawa, MD. Schedule an appointment as soon as possible for a visit in 2 week(s).   Specialties: Cardiology, Radiology Contact information: [address] [place] [address] [phone]  The results of significant diagnostics from this hospitalization (including imaging, microbiology, ancillary and laboratory) are listed below for reference.    Significant Diagnostic Studies: DG Chest 2 View  Result Date: 07/20/2020 CLINICAL DATA:  Chest pain EXAM: CHEST - 2 VIEW COMPARISON:  01/20/2019 FINDINGS: Stable left-sided implanted cardiac device. The heart size and mediastinal contours are within normal limits. Atherosclerotic calcification of the aortic knob. No focal airspace  consolidation, pleural effusion, or pneumothorax. The visualized skeletal structures are unremarkable. IMPRESSION: No active cardiopulmonary disease. Electronically Signed   By: Duanne Guess D.O.   On: 07/20/2020 12:14   CARDIAC CATHETERIZATION  Result Date: 07/21/2020  1st Diag lesion is 60% stenosed.  Mid LAD lesion is 95% stenosed.  Post intervention, there is a 5% residual stenosis.  A drug-eluting stent was successfully placed using a STENT RESOLUTE ONYX [id number].  Dist LAD lesion is 40% stenosed.  1.  Severe one-vessel coronary artery disease with 95% moderately calcified stenosis in the mid LAD which seems to be the culprit for non-ST elevation myocardial infarction.  The RCA is heavily calcified with mild diffuse disease.  2.  Mildly elevated left ventricular end-diastolic pressure at 15 mmHg. 3.  Successful angioplasty and drug-eluting stent placement to the mid LAD. Recommendations: Dual antiplatelet therapy for at least 1 year. Aggressive treatment of risk factors. Likely discharge home tomorrow if no issues.   ECHOCARDIOGRAM COMPLETE  Result Date: 07/21/2020    ECHOCARDIOGRAM REPORT   Patient Name:   Paul Paul Date of Exam: 07/21/2020 Medical Rec #:  [id number]     Height:       75.0 in Accession #:    [id number]    Weight:       275.0 lb Date of Birth:  08-17-1956      BSA:          2.512 m Patient Age:    64 years      BP:           129/71 mmHg Patient Gender: M             HR:           61 bpm. Exam Location:  Inpatient Procedure: 2D Echo Indications:    I24.9 acute coronary syndrome  History:        Patient has no prior history of Echocardiogram examinations.                 Pacemaker; Risk Factors:Diabetes and Hypertension.  Sonographer:    Celene Skeen RDCS (AE) Referring Phys: [id number] [facility]  Sonographer Comments: Patient is morbidly obese. Image acquisition challenging due to patient body habitus. IMPRESSIONS  1. Left ventricular ejection fraction, by estimation,  is 55 to 60%. The left ventricle has normal function. The left ventricle has no regional wall motion abnormalities. Left ventricular diastolic parameters were normal.  2. Pacing wires in RA/RV. Right ventricular systolic function is normal. The right ventricular size is normal.  3. The mitral valve is normal in structure. No evidence of mitral valve regurgitation. No evidence of mitral stenosis.  4. The aortic valve was not well visualized. Aortic valve regurgitation is not visualized. No aortic stenosis is present.  5. The inferior vena cava is normal in size with greater than 50% respiratory variability, suggesting right atrial pressure of 3 mmHg. FINDINGS  Left Ventricle: Left ventricular ejection fraction, by estimation, is 55 to 60%. The left ventricle has normal function. The left ventricle has no regional wall motion  abnormalities. The left ventricular internal cavity size was normal in size. There is  no left ventricular hypertrophy. Left ventricular diastolic parameters were normal. Right Ventricle: Pacing wires in RA/RV. The right ventricular size is normal. No increase in right ventricular wall thickness. Right ventricular systolic function is normal. Left Atrium: Left atrial size was normal in size. Right Atrium: Right atrial size was normal in size. Pericardium: There is no evidence of pericardial effusion. Mitral Valve: The mitral valve is normal in structure. Normal mobility of the mitral valve leaflets. No evidence of mitral valve regurgitation. No evidence of mitral valve stenosis. Tricuspid Valve: The tricuspid valve is normal in structure. Tricuspid valve regurgitation is not demonstrated. No evidence of tricuspid stenosis. Aortic Valve: The aortic valve was not well visualized. Aortic valve regurgitation is not visualized. No aortic stenosis is present. Pulmonic Valve: The pulmonic valve was normal in structure. Pulmonic valve regurgitation is not visualized. No evidence of pulmonic stenosis.  Aorta: The aortic root is normal in size and structure. Venous: The inferior vena cava is normal in size with greater than 50% respiratory variability, suggesting right atrial pressure of 3 mmHg. IAS/Shunts: No atrial level shunt detected by color flow Doppler.  LEFT VENTRICLE PLAX 2D LVIDd:         4.40 cm  Diastology LVIDs:         2.80 cm  LV e' lateral:   12.10 cm/s LV PW:         1.10 cm  LV E/e' lateral: 7.1 LV IVS:        0.90 cm  LV e' medial:    6.85 cm/s LVOT diam:     2.30 cm  LV E/e' medial:  12.6 LV SV:         62 LV SV Index:   25 LVOT Area:     4.15 cm  LEFT ATRIUM           Index LA diam:      3.60 cm 1.43 cm/m LA Vol (A2C): 73.2 ml 29.14 ml/m  AORTIC VALVE LVOT Vmax:   74.40 cm/s LVOT Vmean:  54.700 cm/s LVOT VTI:    0.149 m  AORTA Ao Root diam: 3.40 cm MITRAL VALVE MV Area (PHT): 2.69 cm    SHUNTS MV Decel Time: 282 msec    Systemic VTI:  0.15 m MV E velocity: 86.10 cm/s  Systemic Diam: 2.30 cm MV A velocity: 87.40 cm/s MV E/A ratio:  0.99 Charlton Haws MD Electronically signed by Charlton Haws MD Signature Date/Time: 07/21/2020/12:26:18 PM    Final     Microbiology: Recent Results (from the past 240 hour(s))  SARS Coronavirus 2 by RT PCR (hospital order, performed in [facility] hospital lab) Nasopharyngeal Nasopharyngeal Swab     Status: None   Collection Time: 07/21/20  1:17 AM   Specimen: Nasopharyngeal Swab  Result Value Ref Range Status   SARS Coronavirus 2 NEGATIVE NEGATIVE Final    Comment: (NOTE) SARS-CoV-2 target nucleic acids are NOT DETECTED.  The SARS-CoV-2 RNA is generally detectable in upper and lower respiratory specimens during the acute phase of infection. The lowest concentration of SARS-CoV-2 viral copies this assay can detect is 250 copies / mL. A negative result does not preclude SARS-CoV-2 infection and should not be used as the sole basis for treatment or other patient management decisions.  A negative result may occur with improper specimen collection /  handling, submission of specimen other than nasopharyngeal swab, presence of viral mutation(s) within the  areas targeted by this assay, and inadequate number of viral copies (<250 copies / mL). A negative result must be combined with clinical observations, patient history, and epidemiological information.  Fact Sheet for Patients:   BoilerBrush.com.cy  Fact Sheet for Healthcare Providers: https://pope.com/  This test is not yet approved or  cleared by the [place] FDA and has been authorized for detection and/or diagnosis of SARS-CoV-2 by FDA under an Emergency Use Authorization (EUA).  This EUA will remain in effect (meaning this test can be used) for the duration of the COVID-19 declaration under Section 564(b)(1) of the Act, 21 U.S.C. section 360bbb-3(b)(1), unless the authorization is terminated or revoked sooner.  Performed at [facility], [address], [place], [place] [address]      Labs: Basic Metabolic Panel: Recent Labs  Lab 07/20/20 1142 07/22/20 0901  NA 130* 135  K 4.2 4.1  CL 98 105  CO2 23 22  GLUCOSE 399* 205*  BUN 11 11  CREATININE 1.06 0.99  CALCIUM 9.6 9.2   Liver Function Tests: No results for input(s): AST, ALT, ALKPHOS, BILITOT, PROT, ALBUMIN in the last 168 hours. No results for input(s): LIPASE, AMYLASE in the last 168 hours. No results for input(s): AMMONIA in the last 168 hours. CBC: Recent Labs  Lab 07/20/20 1142 07/21/20 0308 07/22/20 0421  WBC 5.8 6.9 6.5  HGB 15.9 15.7 16.1  HCT 47.5 46.9 47.9  MCV 89.8 90.9 92.6  PLT 170 172 148*   Cardiac Enzymes: No results for input(s): CKTOTAL, CKMB, CKMBINDEX, TROPONINI in the last 168 hours. BNP: BNP (last 3 results) No results for input(s): BNP in the last 8760 hours.  ProBNP (last 3 results) No results for input(s): PROBNP in the last 8760 hours.  CBG: Recent Labs  Lab 07/21/20 0740 07/21/20 1330 07/21/20 1616  07/21/20 2120 07/22/20 0803  GLUCAP 220* 153* 139* 311* 186*       Signed:  Jerrett Baldinger  [facility] 07/22/2020, 12:27 PM

## 2020-07-22 NOTE — Telephone Encounter (Signed)
TOC per Judy Pimple on 08/02/20 at 11:15am with Nada Boozer

## 2020-07-22 NOTE — Discharge Instructions (Signed)
Heart Attack A heart attack occurs when blood and oxygen supply to the heart is cut off. A heart attack causes damage to the heart that cannot be fixed. A heart attack is also called a myocardial infarction, or MI. If you think you are having a heart attack, do not wait to see if the symptoms will go away. Get medical help right away. What are the causes? This condition may be caused by:  A fatty substance (plaque) in the blood vessels (arteries). This can block the flow of blood to the heart.  A blood clot in the blood vessels that go to the heart. The blood clot blocks blood flow.  Low blood pressure.  An abnormal heartbeat.  Some diseases, such as problems in red blood cells (anemia)orproblems in breathing (respiratory failure).  Tightening (spasm) of a blood vessel that cuts off blood to the heart.  A tear in a blood vessel of the heart.  High blood pressure. What increases the risk? The following factors may make you more likely to develop this condition:  Aging. The older you are, the higher your risk.  Having a personal or family history of chest pain, heart attack, stroke, or narrowing of the arteries in the legs, arms, head, or stomach (peripheral artery disease).  Being male.  Smoking.  Not getting regular exercise.  Being overweight or obese.  Having high blood pressure.  Having high cholesterol.  Having diabetes.  Drinking too much alcohol.  Using illegal drugs, such as cocaine or methamphetamine. What are the signs or symptoms? Symptoms of this condition include:  Chest pain. It may feel like: ? Crushing or squeezing. ? Tightness, pressure, fullness, or heaviness.  Pain in the arm, neck, jaw, back, or upper body.  Shortness of breath.  Heartburn.  Upset stomach (indigestion).  Feeling like you may vomit (nauseous).  Cold sweats.  Feeling tired.  Sudden light-headedness. How is this treated? A heart attack must be treated as soon as  possible. Treatment may include:  Medicines to: ? Break up or dissolve blood clots. ? Thin blood and help prevent blood clots. ? Treat blood pressure. ? Improve blood flow to the heart. ? Reduce pain. ? Reduce cholesterol.  Procedures to widen a blocked artery and keep it open.  Open heart surgery.  Receiving oxygen.  Making your heart strong again (cardiac rehabilitation) through exercise, education, and counseling. Follow these instructions at home: Medicines  Take over-the-counter and prescription medicines only as told by your doctor. You may need to take medicine: ? To keep your blood from clotting too easily. ? To control blood pressure. ? To lower cholesterol. ? To control heart rhythms.  Do not take these medicines unless your doctor says it is okay: ? NSAIDs, such as ibuprofen. ? Supplements that have vitamin A, vitamin E, or both. ? Hormone replacement therapy that has estrogen with or without progestin. Lifestyle      Do not use any products that have nicotine or tobacco, such as cigarettes, e-cigarettes, and chewing tobacco. If you need help quitting, ask your doctor.  Avoid secondhand smoke.  Exercise regularly. Ask your doctor about a cardiac rehab program.  Eat heart-healthy foods. Your doctor will tell you what foods to eat.  Stay at a healthy weight.  Lower your stress level.  Do not use illegal drugs. Alcohol use  Do not drink alcohol if: ? Your doctor tells you not to drink. ? You are pregnant, may be pregnant, or are planning to become pregnant.    If you drink alcohol: ? Limit how much you use to:  0-1 drink a day for women.  0-2 drinks a day for men. ? Know how much alcohol is in your drink. In the [place] one drink equals one 12 oz bottle of beer (355 mL), one 5 oz glass of wine (148 mL), or one 1 oz glass of hard liquor (44 mL). General instructions  Work with your doctor to treat other problems you may have, such as diabetes or high  blood pressure.  Get screened for depression. Get treatment if needed.  Keep your vaccines up to date. Get the flu shot (influenza vaccine) every year.  Keep all follow-up visits as told by your doctor. This is important. Contact a doctor if:  You feel very sad.  You have trouble doing your daily activities. Get help right away if:  You have sudden, unexplained discomfort in your chest, arms, back, neck, jaw, or upper body.  You have shortness of breath.  You have sudden sweating or clammy skin.  You feel like you may vomit.  You vomit.  You feel tired or weak.  You get light-headed or dizzy.  You feel your heart beating fast.  You feel your heart skipping beats.  You have blood pressure that is higher than 180/120. These symptoms may be an emergency. Do not wait to see if the symptoms will go away. Get medical help right away. Call your local emergency services (911 in the [place] Do not drive yourself to the hospital. Summary  A heart attack occurs when blood and oxygen supply to the heart is cut off.  Do not take NSAIDs unless your doctor says it is okay.  Do not smoke. Avoid secondhand smoke.  Exercise regularly. Ask your doctor about a cardiac rehab program. This information is not intended to replace advice given to you by your health care provider. Make sure you discuss any questions you have with your health care provider. Document Revised: 03/03/2019 Document Reviewed: 03/03/2019 Elsevier Patient Education  2020 Elsevier Inc.  

## 2020-07-22 NOTE — Progress Notes (Signed)
Pt set up on nasal CPAP tolerating well at this time.

## 2020-07-22 NOTE — Plan of Care (Signed)
  Problem: Activity: Goal: Ability to return to baseline activity level will improve Outcome: Progressing   

## 2020-07-23 ENCOUNTER — Telehealth: Payer: Self-pay

## 2020-07-23 NOTE — Telephone Encounter (Signed)
**Note De-Identified  Obfuscation** Patient contacted regarding discharge from [facility] on 07/22/2020.  Patient understands to follow up with provider Lady Saucier on 08/02/2020 at 11:15 at [address], [address] in [place], [place] [address]. Patient understands discharge instructions? Yes Patient understands medications and regiment? Yes Patient understands to bring all medications to this visit? Yes  Ask patient:  Are you enrolled in My Chart: No but is considering signing up.

## 2020-07-23 NOTE — Telephone Encounter (Cosign Needed)
Transition Care Management Follow-up Telephone Call  Date of discharge and from where: 07/22/20 from [facility]  How have you been since you were released from the hospital? fine  Any questions or concerns? Yes  Items Reviewed:  Did the pt receive and understand the discharge instructions provided? Yes   Medications obtained and verified? Yes   Any new allergies since your discharge? No   Dietary orders reviewed? Yes  Do you have support at home? Yes   Functional Questionnaire: (I = Independent and D = Dependent) ADLs: I  Bathing/Dressing- I  Meal Prep- I  Eating- I  Maintaining continence- I  Transferring/Ambulation- I  Managing Meds- I  Follow up appointments reviewed:   PCP Hospital f/u appt confirmed? Yes  Scheduled to see Dr Durene Cal on 07/28/20 @ 11:20 a.m.  Specialist Hospital f/u appt confirmed? Yes  Scheduled to see CVD on 08/02/20 @ 11:15a.m.  Are transportation arrangements needed? No   If their condition worsens, is the pt aware to call PCP or go to the Emergency Dept.? Yes  Was the patient provided with contact information for the PCP's office or ED? Yes  Was to pt encouraged to call back with questions or concerns? Yes

## 2020-07-27 NOTE — Progress Notes (Signed)
Phone [phone]   Subjective:  Paul Paul is a 64 y.o. year old very pleasant male patient who presents for transitional care management and hospital follow up for NSTEMI. Patient was hospitalized from 07/20/20 to 07/22/20. A TCM phone call was completed on 07/23/20. Medical complexity moderate   64 year old gentleman with history of sick sinus syndrome status post pacemaker, anxiety, diabetes, cerebral aneurysm history, OSA on CPAP who presented with chest pain and mild shortness of breath-ultimately found to have NSTEMI.  Patient presented with chest pain 7 out of 10 with mild shortness of breath but no cough or wheeze.  Chest x-ray without significant respiratory pathology. pain increased at time of hospitalization to 10 out of 10.  Patient was given nitroglycerin and aspirin per notes-patient states was never given nitroglycerin though.  Chest pain resolved while hospitalized.  Troponins trended up to 359 consistent with NSTEMI and patient was admitted to the hospital and cardiology was consulted.  Patient initially was started on aspirin, metoprolol, statin.  After catheterization with severe one-vessel coronary disease with 95% moderately calcified stenosis in the mid LAD-patient was given a drug-eluting stent to the mid LAD.  Brilinta was also added to his aspirin-plan for at least 1 year of dual antiplatelet therapy.  Patient states with [facility] is able to get Brilinta for $45 a month which is affordable for him   Of note patient with history of hyperlipidemia but in the past has had LFT elevation/hepatotoxicity with statins-I had tried to get patient to a please take lovastatin once a week but he had opted to stop taking this.  At present he is on atorvastatin 80 mg daily-would like to check LFTs at least every 3 to 6 months  Sugars trending down- has drastically improved diet. Down 15 lbs since Tuesday. Sugar down to 140 over last few days. After breakfast still stayed at  180 this morning.  Significant flatulence issues with metformin and would even cause incontinence at times. Has not tried extended release but due to prior issues does not want to try at this time unles cant bring sugars down with lifestyle changes  OSA-patient states prior machine had broken but he has been able to obtain new machine recently.  Hypertension-blood pressure high normal on metoprolol 25 mg twice daily. -Discharge summary also stated patient was to be started on losartan 25 mg after catheterization.  This was not started before discharge as planned.  Patient does have an allergy to losartan hydrochlorothiazide-allergy listed as lightheadedness/fatigue-may have been due to diuretic portion. BP Readings from Last 3 Encounters:  07/28/20 138/78  07/22/20 135/66  10/02/19 128/64   Independent review of chest x-ray from 07/20/2020 Airways normal, no bony abnormalities, cardiovascular silhouette normal, diaphragm normal, no airspace opacities, pacemaker and wires noted.  Aortic atherosclerosis noted  Please note included in services today . Obtaining and reviewing the discharge information (discharge summary) . Reviewing need for or follow-up on pending diagnostic tests and treatments. Encouraged keeping cardiology follow up and 6 week diabetes follow up . patient's system-specific problems. . Education of patient, family, guardian, and/or caregiver and review of discharge summary together. Also extensive diabetes counseling around medication options(he ultimately declined) .   See problem oriented charting as well  Past Medical History-  Patient Active Problem List   Diagnosis Date Noted  . CAD (coronary artery disease) status post drug-eluting stent mid LAD August 2021 07/28/2020    Priority: High  . Cerebral aneurysm without rupture 01/01/2012  Priority: High  . Pacemaker 10/07/2011    Priority: High  . Poorly controlled type 2 diabetes mellitus (HCC) 11/09/2008     Priority: High  . Aortic atherosclerosis (HCC) 07/28/2020    Priority: Medium  . Fatty liver 06/18/2018    Priority: Medium  . 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 Elevated LFTs 08/09/2017    Priority: Medium  . GAD (generalized anxiety disorder) 10/11/2016    Priority: Medium  . OSA on CPAP 09/24/2014    Priority: Medium  . Hyperlipidemia associated with type 2 diabetes mellitus (HCC) 09/24/2014    Priority: Medium  . Other specified cardiac dysrhythmias(427.89) 07/09/2012    Priority: Medium  . Hypertension associated with diabetes (HCC) 12/10/2007    Priority: Medium  . Obesity 01/01/2014    Priority: Low  . NSTEMI (non-ST elevated myocardial infarction) (HCC) 07/20/2020  . Sinus node dysfunction (HCC) 08/05/2019  . Chronic pruritic rash in adult 08/09/2017    Medications- reviewed and updated  A medical reconciliation was performed comparing current medicines to hospital discharge medications. Current Outpatient Medications  Medication Sig Dispense Refill  . aspirin EC 81 MG tablet Take 81 mg by mouth daily.    Marland Kitchen atorvastatin (LIPITOR) 80 MG tablet Take 1 tablet (80 mg total) by mouth daily. 30 tablet 1  . glimepiride (AMARYL) 4 MG tablet TAKE 2 TABLETS  BY MOUTH DAILY BEFORE BREAKFAST. (Patient taking differently: Take 8 mg by mouth daily. ) 180 tablet 1  . metoprolol tartrate (LOPRESSOR) 25 MG tablet Take 1 tablet (25 mg total) by mouth 2 (two) times daily. 60 tablet 1  . ticagrelor (BRILINTA) 90 MG TABS tablet Take 1 tablet (90 mg total) by mouth 2 (two) times daily. 60 tablet 1   No current facility-administered medications for this visit.   Objective  Objective:  BP 138/78   Pulse 77   Temp (!) 96.9 F (36.1 C) (Temporal)   Ht 6\' 3"  (1.905 m)   Wt 270 lb (122.5 kg)   SpO2 97%   BMI 33.75 kg/m  Gen: NAD, resting comfortably CV: RRR no murmurs rubs or gallops Lungs: CTAB no crackles, wheeze, rhonchi Ext: no edema Skin: warm,  dry    Assessment and Plan:   #TCM/hospital follow-up for NSTEMI  CAD (coronary artery disease) status post drug-eluting stent mid LAD August 2021 #CAD  #Hyperlipidemia  appears stable after stent.  Continue aspirin and Brilinta for at least 1 year.  Continue statin-we will need to monitor LDL level at least 6 weeks out and make sure at least under 70 and possibly under 55.  We will also need to monitor LFTs.  Also continue metoprolol.  Keep cardiology visit in 5 days -I also gave patient a prescription for nitroglycerin and discussed appropriate use  Hypertension associated with diabetes (HCC) #Hypertension -Compliant with metoprolol 25 mg twice daily -After NSTEMI cardiology wanted patient started on losartan 25 mg but this was not started at discharge.  Discussed with patient today may be was related to prior allergy listing.  This was with losartan hydrochlorothiazide combined so I think is reasonable to try low-dose losartan-I will have him start with just 12.5 mg and has cardiology follow-up next week.  If he has recurrent dizziness/fatigue like he had already prior medication should let me know-I agree pushing blood pressure lower than 138 is reasonable  Cerebral aneurysm without rupture Patient wonders on Brilinta and aspirin if he can have follow-up catheterization with interventional  radiology-I told him I was not sure of the answer at this time but this would need to be coordinated with cardiology and interventional radiology when the time comes-my preference would be to leave him on Brilinta and aspirin for a year if possible before taking him off for this procedure  Poorly controlled type 2 diabetes mellitus (HCC) Patient has been able to control diabetes in the past with diet and exercise but recently has had very poor control.  Blood sugars have improved since hospitalization-has drastically improved diet down 15 pounds.  He is compliant with glimepiride 8 mg  Lab Results   Component Value Date   HGBA1C 10.4 (H) 07/20/2020   HGBA1C 9.4 (H) 10/02/2019   HGBA1C 11.4 (A) 01/20/2019  In the past I have prescribed metformin but he does not want to take this due to GI issues.  In the past I prescribed insulin ReliOn 70/30 but he later stopped this.  He does not want to restart insulin or start other medications at this time.  Since his blood sugars have improved so much recently down to 140s fasting and not over 180 after meals he would like to continue to work on healthy eating/regular exercise and continue glimepiride 8 mg and recheck in 6 weeks-I would prefer to be more aggressive with medication but he declines for now.  Specifically I discussed Ozempic and Jardiance which also have cardiovascular benefit-he seems to be leery of potential side effects with newer medications    OSA on CPAP Patient states prior CPAP broke but he was able to obtain a new machine.  Recommended regular compliance with this-possible this increased cardiac strain prior to heart attack being off treatment with CPAP  Fatty liver Patient does have history of fatty liver and elevated LFTs on statin in the past-we will need to recheck LFTs and lipid panel at next visit in October  Aortic atherosclerosis [facility]) Discovered duringChest x-ray 07/20/2020 at time of NSTEMI-thankful patient has started statin therapy.  Continue aspirin and Brilinta as well for now  Sinus node dysfunction (HCC) Previously listed as History of sick sinus syndrome-on metoprolol 25 mg twice daily and also has pacemaker in place-continue cardiology follow-up-has visit in 5 days   Recommended follow up: Recommended October follow-up to check in on diabetes Future Appointments  Date Time Provider Department Center  08/02/2020 11:15 AM Leone Brand, NP [facility]  08/19/2020  2:45 PM Marinus Maw, MD [facility]    Lab/Order associations:   ICD-10-CM   1. Poorly controlled type 2  diabetes mellitus (HCC)  E11.65 CBC With Differential/Platelet    COMPLETE METABOLIC PANEL WITH GFR    COMPLETE METABOLIC PANEL WITH GFR    CBC With Differential/Platelet  2. Aortic atherosclerosis (HCC)  I70.0   3. Hyperlipidemia associated with type 2 diabetes mellitus (HCC)  E11.69    E78.5   4. Coronary artery disease involving native coronary artery of native heart without angina pectoris  I25.10   5. Hypertension associated with diabetes (HCC)  E11.59    I10   6. Cerebral aneurysm without rupture  I67.1     Meds ordered this encounter  Medications  . nitroGLYCERIN (NITROSTAT) 0.4 MG SL tablet    Sig: Place 1 tablet (0.4 mg total) under the tongue every 5 (five) minutes as needed for chest pain (up to 3 doses. call for help if doesnt resolve after 1 dose.).    Dispense:  30 tablet    Refill:  3  .  losartan (COZAAR) 25 MG tablet    Sig: Take 1 tablet (25 mg total) by mouth daily.    Dispense:  30 tablet    Refill:  3    Return precautions advised.  Tana ConchStephen Ethan Kasperski, MD

## 2020-07-28 ENCOUNTER — Telehealth (HOSPITAL_COMMUNITY): Payer: Self-pay

## 2020-07-28 ENCOUNTER — Other Ambulatory Visit: Payer: Self-pay

## 2020-07-28 ENCOUNTER — Ambulatory Visit (INDEPENDENT_AMBULATORY_CARE_PROVIDER_SITE_OTHER): Payer: Medicare HMO | Admitting: Family Medicine

## 2020-07-28 ENCOUNTER — Encounter: Payer: Self-pay | Admitting: Family Medicine

## 2020-07-28 VITALS — BP 138/78 | HR 77 | Temp 96.9°F | Ht 75.0 in | Wt 270.0 lb

## 2020-07-28 DIAGNOSIS — E1169 Type 2 diabetes mellitus with other specified complication: Secondary | ICD-10-CM

## 2020-07-28 DIAGNOSIS — I671 Cerebral aneurysm, nonruptured: Secondary | ICD-10-CM | POA: Diagnosis not present

## 2020-07-28 DIAGNOSIS — G4733 Obstructive sleep apnea (adult) (pediatric): Secondary | ICD-10-CM

## 2020-07-28 DIAGNOSIS — E785 Hyperlipidemia, unspecified: Secondary | ICD-10-CM

## 2020-07-28 DIAGNOSIS — E1159 Type 2 diabetes mellitus with other circulatory complications: Secondary | ICD-10-CM | POA: Diagnosis not present

## 2020-07-28 DIAGNOSIS — E1165 Type 2 diabetes mellitus with hyperglycemia: Secondary | ICD-10-CM | POA: Diagnosis not present

## 2020-07-28 DIAGNOSIS — I700 Atherosclerosis of aorta: Secondary | ICD-10-CM | POA: Diagnosis not present

## 2020-07-28 DIAGNOSIS — I495 Sick sinus syndrome: Secondary | ICD-10-CM | POA: Diagnosis not present

## 2020-07-28 DIAGNOSIS — I2510 Atherosclerotic heart disease of native coronary artery without angina pectoris: Secondary | ICD-10-CM | POA: Diagnosis not present

## 2020-07-28 DIAGNOSIS — I10 Essential (primary) hypertension: Secondary | ICD-10-CM

## 2020-07-28 DIAGNOSIS — Z9989 Dependence on other enabling machines and devices: Secondary | ICD-10-CM

## 2020-07-28 DIAGNOSIS — K760 Fatty (change of) liver, not elsewhere classified: Secondary | ICD-10-CM | POA: Diagnosis not present

## 2020-07-28 MED ORDER — LOSARTAN POTASSIUM 25 MG PO TABS: 25.0000 mg | ORAL_TABLET | Freq: Every day | ORAL | 3 refills | Status: DC

## 2020-07-28 MED ORDER — NITROGLYCERIN 0.4 MG SL SUBL: 0.4000 mg | SUBLINGUAL_TABLET | SUBLINGUAL | 3 refills | Status: AC | PRN

## 2020-07-28 NOTE — Assessment & Plan Note (Addendum)
#  CAD  #Hyperlipidemia  appears stable after stent.  Continue aspirin and Brilinta for at least 1 year.  Continue statin-we will need to monitor LDL level at least 6 weeks out and make sure at least under 70 and possibly under 55.  We will also need to monitor LFTs.  Also continue metoprolol.  Keep cardiology visit in 5 days -I also gave patient a prescription for nitroglycerin and discussed appropriate use

## 2020-07-28 NOTE — Assessment & Plan Note (Signed)
Patient states prior CPAP broke but he was able to obtain a new machine.  Recommended regular compliance with this-possible this increased cardiac strain prior to heart attack being off treatment with CPAP

## 2020-07-28 NOTE — Patient Instructions (Addendum)
Health Maintenance Due  Topic Date Due  . COLONOSCOPY will make app later . Likely 1 year out brilinta.  02/24/2017  . OPHTHALMOLOGY EXAM will make app later  12/28/2017  . INFLUENZA VACCINE will get later date - next visit 07/04/2020   Start losartan 25 mg-only take a half a tablet until you see cardiology.  In the past you have experienced dizziness and fatigue when you are on this along with a diuretic-I am hoping since you are just on the losartan portion that you will not have issues but please let me know if you have dizziness with this  We discussed possibly using Jardiance or Ozempic-he wanted to hold off for now.  We also discussed Metformin extended release due to prior GI issues he wanted to hold off for now-you have been to be aggressive about healthy eating/regular exercise once allowed by cardiology and we will recheck in October.  Recommended follow up: schedule a visit in October to check on blood sugars-it will be too soon for an A1c at that time but I would like for your fasting sugars to be closer to 100-lets continue to work on diet/exercise since he wanted to hold off on Metformin at this time

## 2020-07-28 NOTE — Assessment & Plan Note (Signed)
Previously listed as History of sick sinus syndrome-on metoprolol 25 mg twice daily and also has pacemaker in place-continue cardiology follow-up-has visit in 5 days

## 2020-07-28 NOTE — Assessment & Plan Note (Signed)
Discovered duringChest x-ray 07/20/2020 at time of NSTEMI-thankful patient has started statin therapy.  Continue aspirin and Brilinta as well for now

## 2020-07-28 NOTE — Telephone Encounter (Signed)
Pt insurance is active and benefits verified through [facility]. Co-pay $10.00, DED $0.00/$0.00 met, out of pocket $3,900.00/$25.00 met, co-insurance 0%. No pre-authorization required. Passport, 07/28/20 @ 11:19AM, DTO#[id number]  Will contact patient to see if he is interested in the Cardiac Rehab Program. If interested, patient will need to complete follow up appt. Once completed, patient will be contacted for scheduling upon review by the RN Navigator.

## 2020-07-28 NOTE — Assessment & Plan Note (Signed)
Patient does have history of fatty liver and elevated LFTs on statin in the past-we will need to recheck LFTs and lipid panel at next visit in October

## 2020-07-28 NOTE — Assessment & Plan Note (Addendum)
Patient has been able to control diabetes in the past with diet and exercise but recently has had very poor control.  Blood sugars have improved since hospitalization-has drastically improved diet down 15 pounds.  He is compliant with glimepiride 8 mg  Lab Results  Component Value Date   HGBA1C 10.4 (H) 07/20/2020   HGBA1C 9.4 (H) 10/02/2019   HGBA1C 11.4 (A) 01/20/2019  In the past I have prescribed metformin but he does not want to take this due to GI issues.  In the past I prescribed insulin ReliOn 70/30 but he later stopped this.  He does not want to restart insulin or start other medications at this time.  Since his blood sugars have improved so much recently down to 140s fasting and not over 180 after meals he would like to continue to work on healthy eating/regular exercise and continue glimepiride 8 mg and recheck in 6 weeks-I would prefer to be more aggressive with medication but he declines for now.  Specifically I discussed Ozempic and Jardiance which also have cardiovascular benefit-he seems to be leery of potential side effects with newer medications

## 2020-07-28 NOTE — Assessment & Plan Note (Signed)
#  Hypertension -Compliant with metoprolol 25 mg twice daily -After NSTEMI cardiology wanted patient started on losartan 25 mg but this was not started at discharge.  Discussed with patient today may be was related to prior allergy listing.  This was with losartan hydrochlorothiazide combined so I think is reasonable to try low-dose losartan-I will have him start with just 12.5 mg and has cardiology follow-up next week.  If he has recurrent dizziness/fatigue like he had already prior medication should let me know-I agree pushing blood pressure lower than 138 is reasonable

## 2020-07-28 NOTE — Assessment & Plan Note (Signed)
Patient wonders on Brilinta and aspirin if he can have follow-up catheterization with interventional radiology-I told him I was not sure of the answer at this time but this would need to be coordinated with cardiology and interventional radiology when the time comes-my preference would be to leave him on Brilinta and aspirin for a year if possible before taking him off for this procedure

## 2020-07-28 NOTE — Telephone Encounter (Signed)
Attempted to call patient in regards to Cardiac Rehab - LM on VM 

## 2020-07-28 NOTE — Assessment & Plan Note (Deleted)
History of sick sinus syndrome-on metoprolol 25 mg twice daily and also has pacemaker in place-continue cardiology follow-up-has visit in 5 days

## 2020-07-29 LAB — CBC WITH DIFFERENTIAL/PLATELET
Absolute Monocytes: 490 cells/uL (ref 200–950)
Basophils Absolute: 83 cells/uL (ref 0–200)
Basophils Relative: 1.4 %
Eosinophils Absolute: 177 cells/uL (ref 15–500)
Eosinophils Relative: 3 %
HCT: 47.4 % (ref 38.5–50.0)
Hemoglobin: 16.4 g/dL (ref 13.2–17.1)
Lymphs Abs: 2018 cells/uL (ref 850–3900)
MCH: 31 pg (ref 27.0–33.0)
MCHC: 34.6 g/dL (ref 32.0–36.0)
MCV: 89.6 fL (ref 80.0–100.0)
MPV: 10 fL (ref 7.5–12.5)
Monocytes Relative: 8.3 %
Neutro Abs: 3133 cells/uL (ref 1500–7800)
Neutrophils Relative %: 53.1 %
Platelets: 193 10*3/uL (ref 140–400)
RBC: 5.29 10*6/uL (ref 4.20–5.80)
RDW: 13 % (ref 11.0–15.0)
Total Lymphocyte: 34.2 %
WBC: 5.9 10*3/uL (ref 3.8–10.8)

## 2020-07-29 LAB — COMPLETE METABOLIC PANEL WITH GFR
AG Ratio: 1 (calc) (ref 1.0–2.5)
ALT: 50 U/L — ABNORMAL HIGH (ref 9–46)
AST: 56 U/L — ABNORMAL HIGH (ref 10–35)
Albumin: 3.7 g/dL (ref 3.6–5.1)
Alkaline phosphatase (APISO): 71 U/L (ref 35–144)
BUN: 11 mg/dL (ref 7–25)
CO2: 23 mmol/L (ref 20–32)
Calcium: 9.6 mg/dL (ref 8.6–10.3)
Chloride: 101 mmol/L (ref 98–110)
Creat: 1.07 mg/dL (ref 0.70–1.25)
GFR, Est African American: 84 mL/min/{1.73_m2} (ref >=60)
GFR, Est Non African American: 72 mL/min/{1.73_m2} (ref >=60)
Globulin: 3.7 g/dL (calc) (ref 1.9–3.7)
Glucose, Bld: 217 mg/dL — ABNORMAL HIGH (ref 65–99)
Potassium: 4.2 mmol/L (ref 3.5–5.3)
Sodium: 134 mmol/L — ABNORMAL LOW (ref 135–146)
Total Bilirubin: 1.5 mg/dL — ABNORMAL HIGH (ref 0.2–1.2)
Total Protein: 7.4 g/dL (ref 6.1–8.1)

## 2020-08-01 NOTE — Progress Notes (Signed)
Cardiology Office Note   Date:  08/02/2020   ID:  Paul, Paul 1956/06/10, MRN [id number]  PCP:  Shelva Majestic, MD  Cardiologist:    Dr Izora Ribas  Chief Complaint  Patient presents with  . Hospitalization Follow-up      History of Present Illness: Paul Paul is a 64 y.o. male who presents for post hospital-NSTEMI hs troponin 358  History diabetes mellitus, hypertension, prior pacemaker, prior cerebral aneurysm admitted with non-ST elevation myocardial infarction.  Echocardiogram showed normal LV function.  Cardiac catheterization revealed 60% first diagonal, 95% mid LAD.  Patient had PCI of the LAD with drug-eluting stent. brilinta and asa BB stati,  Normal LV   Today  EKG is improved no ST changes.  SR,  pt exercises already he is ready to lift weights, he will start with 50 lbs.   Have asked him to go slower on the weight lifting.   His IR MD is aware of the wt lifting.  No chest pain and no SOB.   Lipid and liver in 12 weeks with PCP. Hx of LFTS in past   Follow up with Dr. Ladona Ridgel and his PCP    Past Medical History:  Diagnosis Date  . Anxiety   . Arthritis   . Bell palsy    > 30 years. Left side of face droops a little  . Bradycardia   . Cerebral aneurysm   . Cerebral aneurysm without rupture 06/2011   Middle of Cerebral Artery  . Complication of anesthesia 2012   after surgery for cliping of cerebral anerysum- In the room, patient was shaking, and patient would awaken and then "go back out" shaking continued after he "went back out:  . Constipation   . Diabetes mellitus    now under control  . Dysrhythmia    BRADYCARDIA REQUIRING PACEMAKER .FOLLOWED BY GRE[facility] TAYLOR  . HYPERTENSION 12/10/2007  . Pacemaker   . Shoulder pain    L SHOULDER. RECENT FALL  . Sleep apnea    CPAP.NOT WEARING NOW.NEEDS REPLACEMENT PIECE    Past Surgical History:  Procedure Laterality Date  . CARDIAC CATHETERIZATION  07/06/11   normal  . CEREBRAL ANEURYSM REPAIR  06/2011    "stenting and coiling" per patient  . CORONARY STENT INTERVENTION N/A 07/21/2020   Procedure: CORONARY STENT INTERVENTION;  Surgeon: Iran Ouch, MD;  Location: [facility];  Service: Cardiovascular;  Laterality: N/A;  . FRACTURE SURGERY Left    foot  . INSERT / REPLACE / REMOVE PACEMAKER  2012  . KNEE SURGERY Left    left; arthoscopic  . LEFT HEART CATH AND CORONARY ANGIOGRAPHY N/A 07/21/2020   Procedure: LEFT HEART CATH AND CORONARY ANGIOGRAPHY;  Surgeon: Iran Ouch, MD;  Location: [facility];  Service: Cardiovascular;  Laterality: N/A;  . rotator cuff surgery Left 02/2012  . septal deviation    . SHOULDER ARTHROSCOPY WITH SUBACROMIAL DECOMPRESSION, ROTATOR CUFF REPAIR AND BICEP TENDON REPAIR Right 08/24/2015   Procedure: RIGHT SHOULDER DIAGNOSTIC OPERATIVE ARTHROSCOPY WITH DEBRIDEMENT, SUBACROMIAL DECOMPRESSION, MIMI-OPEN ROTATOR CUFF TEAR REPAIR AND POSSIBLE  BICEPS TENODESIS.  ;  Surgeon: Cammy Copa, MD;  Location: [facility];  Service: Orthopedics;  Laterality: Right;  RIGHT SHOULDER DIAGNOSTIC OPERATIVE ARTHROSCOPY, DEBRIDEMENT, SUBACROMIAL DECOMPRESSION, MINI-OPEN ROTATOR CUFF TEAR REPAIR, POSSIB     Current Outpatient Medications  Medication Sig Dispense Refill  . aspirin EC 81 MG tablet Take 81 mg by mouth daily.    Marland Kitchen atorvastatin (LIPITOR)  80 MG tablet Take 1 tablet (80 mg total) by mouth daily. 30 tablet 1  . glimepiride (AMARYL) 4 MG tablet TAKE 2 TABLETS  BY MOUTH DAILY BEFORE BREAKFAST. 180 tablet 1  . losartan (COZAAR) 25 MG tablet Take 1 tablet (25 mg total) by mouth daily. 30 tablet 3  . metoprolol tartrate (LOPRESSOR) 25 MG tablet Take 1 tablet (25 mg total) by mouth 2 (two) times daily. 60 tablet 1  . nitroGLYCERIN (NITROSTAT) 0.4 MG SL tablet Place 1 tablet (0.4 mg total) under the tongue every 5 (five) minutes as needed for chest pain (up to 3 doses. call for help if doesnt resolve after 1 dose.). 30 tablet 3  . ticagrelor (BRILINTA) 90 MG  TABS tablet Take 1 tablet (90 mg total) by mouth 2 (two) times daily. 60 tablet 1   No current facility-administered medications for this visit.    Allergies:   Doxycycline, Lisinopril, Losartan potassium-hctz, and Prednisone    Social History:  The patient  reports that he quit smoking about 9 years ago. His smoking use included cigarettes. He has a 20.00 pack-year smoking history. He has never used smokeless tobacco. He reports that he does not drink alcohol and does not use drugs.   Family History:  The patient's family history includes Diabetes in his mother; Heart attack in his father and mother; Heart disease in his father and mother.    ROS:  General:no colds or fevers, no weight changes Skin:no rashes or ulcers HEENT:no blurred vision, no congestion CV:see HPI PUL:see HPI GI:no diarrhea constipation or melena, no indigestion GU:no hematuria, no dysuria MS:no joint pain, no claudication Neuro:no syncope, no lightheadedness Endo:no diabetes, no thyroid disease  Wt Readings from Last 3 Encounters:  08/02/20 269 lb (122 kg)  07/28/20 270 lb (122.5 kg)  07/22/20 274 lb 14.4 oz (124.7 kg)     PHYSICAL EXAM: VS:  BP 124/68   Pulse 60   Ht 6\' 3"  (1.905 m)   Wt 269 lb (122 kg)   SpO2 96%   BMI 33.62 kg/m  , BMI Body mass index is 33.62 kg/m. General:Pleasant affect, NAD Skin:Warm and dry, brisk capillary refill HEENT:normocephalic, sclera clear, mucus membranes moist Neck:supple, no JVD, no bruits  Heart:S1S2 RRR without murmur, gallup, rub or click Lungs:clear without rales, rhonchi, or wheezes , non tender, + BS, do not palpate liver spleen or masses Ext:no lower ext edema, 2+ pedal pulses, 2+ radial pulses Neuro:alert and oriented, MAE, follows commands, + facial symmetry    EKG:  EKG is ordered today. The ekg ordered today demonstrates SR with improved ST changes in ant leads.    Recent Labs: 07/28/2020: ALT 50; BUN 11; Creat 1.07; Hemoglobin 16.4;  Platelets 193; Potassium 4.2; Sodium 134    Lipid Panel    Component Value Date/Time   CHOL 179 07/21/2020 0308   TRIG 140 07/21/2020 0308   HDL 40 (L) 07/21/2020 0308   CHOLHDL 4.5 07/21/2020 0308   VLDL 28 07/21/2020 0308   LDLCALC 111 (H) 07/21/2020 0308   LDLDIRECT 104.0 08/09/2017 1402       Other studies Reviewed: Additional studies/ records that were reviewed today include: . 07/21/20 cardiac cath   1st Diag lesion is 60% stenosed.  Mid LAD lesion is 95% stenosed.  Post intervention, there is a 5% residual stenosis.  A drug-eluting stent was successfully placed using a STENT RESOLUTE ONYX 2.5X26.  Dist LAD lesion is 40% stenosed.   1.  Severe one-vessel coronary artery  disease with 95% moderately calcified stenosis in the mid LAD which seems to be the culprit for non-ST elevation myocardial infarction.  The RCA is heavily calcified with mild diffuse disease.   2.  Mildly elevated left ventricular end-diastolic pressure at 15 mmHg. 3.  Successful angioplasty and drug-eluting stent placement to the mid LAD.  Recommendations: Dual antiplatelet therapy for at least 1 year. Aggressive treatment of risk factors.  Diagnostic Dominance: Right  Intervention    Echo 07/21/20 IMPRESSIONS   1. Left ventricular ejection fraction, by estimation, is 55 to 60%. The  left ventricle has normal function. The left ventricle has no regional  wall motion abnormalities. Left ventricular diastolic parameters were  normal.  2. Pacing wires in RA/RV. Right ventricular systolic function is normal.  The right ventricular size is normal.  3. The mitral valve is normal in structure. No evidence of mitral valve  regurgitation. No evidence of mitral stenosis.  4. The aortic valve was not well visualized. Aortic valve regurgitation  is not visualized. No aortic stenosis is present.  5. The inferior vena cava is normal in size with greater than 50%  respiratory variability,  suggesting right atrial pressure of 3 mmHg.   FINDINGS  Left Ventricle: Left ventricular ejection fraction, by estimation, is 55  to 60%. The left ventricle has normal function. The left ventricle has no  regional wall motion abnormalities. The left ventricular internal cavity  size was normal in size. There is  no left ventricular hypertrophy. Left ventricular diastolic parameters  were normal.   Right Ventricle: Pacing wires in RA/RV. The right ventricular size is  normal. No increase in right ventricular wall thickness. Right ventricular  systolic function is normal.   Left Atrium: Left atrial size was normal in size.   Right Atrium: Right atrial size was normal in size.   Pericardium: There is no evidence of pericardial effusion.   Mitral Valve: The mitral valve is normal in structure. Normal mobility of  the mitral valve leaflets. No evidence of mitral valve regurgitation. No  evidence of mitral valve stenosis.   Tricuspid Valve: The tricuspid valve is normal in structure. Tricuspid  valve regurgitation is not demonstrated. No evidence of tricuspid  stenosis.   Aortic Valve: The aortic valve was not well visualized. Aortic valve  regurgitation is not visualized. No aortic stenosis is present.   Pulmonic Valve: The pulmonic valve was normal in structure. Pulmonic valve  regurgitation is not visualized. No evidence of pulmonic stenosis.   Aorta: The aortic root is normal in size and structure.   Venous: The inferior vena cava is normal in size with greater than 50%  respiratory variability, suggesting right atrial pressure of 3 mmHg.   IAS/Shunts: No atrial level shunt detected by color flow Doppler.   ASSESSMENT AND PLAN:  1.  CAD with recent NSTEMI  And stent to LAD.  pk troponin 358, normal EF.  No chest pain or SOB.  He is stable and no plans to go to cardiac rehab.  He is ready to do more wt lifting, he will over 2 weeks get to his usual wts.  neurology is aware  of wt lifting.  Pt on ASA, Brilinta, statin, and BB.  BP is controlled Follow up in 3 months.  2.  HLD on statin.  PCP to follow  Goal LDL <70.  Would recommend recheck in 12 weeks.  3.  DM-2 poorly controlled prior to MI   Now pt eating healthy, has lost 15  lbs since discharge.  Per PCP.  4.  PPM- ST jude  followed by Dr. Ladona Ridgel and has appt in 1 month.  5.  HTN well controlled today on BB and ARB.  Was elevated on arrival to ER   6.  Cerebral aneurysm treated in past. Followed by Dr. Corliss Skains.   Current medicines are reviewed with the patient today.  The patient Has no concerns regarding medicines.  The following changes have been made:  See above Labs/ tests ordered today include:see above  Disposition:   FU:  see above  Signed, Nada Boozer, NP  08/02/2020 11:35 AM    [facility] [address] [place], [place], [place]  [address]/ [address] [place], [place] Phone: [phone]; Fax: [phone]  [phone]

## 2020-08-02 ENCOUNTER — Other Ambulatory Visit: Payer: Self-pay

## 2020-08-02 ENCOUNTER — Encounter: Payer: Self-pay | Admitting: Cardiology

## 2020-08-02 ENCOUNTER — Ambulatory Visit (INDEPENDENT_AMBULATORY_CARE_PROVIDER_SITE_OTHER): Payer: Medicare HMO | Admitting: Cardiology

## 2020-08-02 VITALS — BP 124/68 | HR 60 | Ht 75.0 in | Wt 269.0 lb

## 2020-08-02 DIAGNOSIS — E782 Mixed hyperlipidemia: Secondary | ICD-10-CM | POA: Diagnosis not present

## 2020-08-02 DIAGNOSIS — Z950 Presence of cardiac pacemaker: Secondary | ICD-10-CM

## 2020-08-02 DIAGNOSIS — E785 Hyperlipidemia, unspecified: Secondary | ICD-10-CM

## 2020-08-02 DIAGNOSIS — E1159 Type 2 diabetes mellitus with other circulatory complications: Secondary | ICD-10-CM

## 2020-08-02 DIAGNOSIS — I2510 Atherosclerotic heart disease of native coronary artery without angina pectoris: Secondary | ICD-10-CM | POA: Diagnosis not present

## 2020-08-02 DIAGNOSIS — I671 Cerebral aneurysm, nonruptured: Secondary | ICD-10-CM | POA: Diagnosis not present

## 2020-08-02 DIAGNOSIS — E118 Type 2 diabetes mellitus with unspecified complications: Secondary | ICD-10-CM | POA: Diagnosis not present

## 2020-08-02 DIAGNOSIS — I214 Non-ST elevation (NSTEMI) myocardial infarction: Secondary | ICD-10-CM

## 2020-08-02 DIAGNOSIS — E1169 Type 2 diabetes mellitus with other specified complication: Secondary | ICD-10-CM | POA: Diagnosis not present

## 2020-08-02 DIAGNOSIS — I10 Essential (primary) hypertension: Secondary | ICD-10-CM

## 2020-08-02 DIAGNOSIS — I152 Hypertension secondary to endocrine disorders: Secondary | ICD-10-CM

## 2020-08-02 NOTE — Patient Instructions (Addendum)
Medication Instructions:  Your physician recommends that you continue on your current medications as directed. Please refer to the Current Medication list given to you today.  *If you need a refill on your cardiac medications before your next appointment, please call your pharmacy*   Lab Work: None ordered  If you have labs (blood work) drawn today and your tests are completely normal, you will receive your results only by: Marland Kitchen MyChart Message (if you have MyChart) OR . A paper copy in the mail If you have any lab test that is abnormal or we need to change your treatment, we will call you to review the results.   Testing/Procedures: None ordered   Follow-Up: At [facility], you and your health needs are our priority.  As part of our continuing mission to provide you with exceptional heart care, we have created designated Provider Care Teams.  These Care Teams include your primary Cardiologist (physician) and Advanced Practice Providers (APPs -  Physician Assistants and Nurse Practitioners) who all work together to provide you with the care you need, when you need it.  We recommend signing up for the patient portal called "MyChart".  Sign up information is provided on this After Visit Summary.  MyChart is used to connect with patients for Virtual Visits (Telemedicine).  Patients are able to view lab/test results, encounter notes, upcoming appointments, etc.  Non-urgent messages can be sent to your provider as well.   To learn more about what you can do with MyChart, go to [email or web address].    Your next appointment:   3 month(s)  The format for your next appointment:   In Person  Provider:   You may see Christell Constant, MD or one of the following Advanced Practice Providers on your designated Care Team:    Ronie Spies, PA-C  Jacolyn Reedy, PA-C    Other Instructions

## 2020-08-17 ENCOUNTER — Telehealth: Payer: Self-pay | Admitting: Internal Medicine

## 2020-08-17 MED ORDER — METOPROLOL TARTRATE 25 MG PO TABS
25.0000 mg | ORAL_TABLET | Freq: Two times a day (BID) | ORAL | 3 refills | Status: DC
Start: 2020-08-17 — End: 2021-02-21

## 2020-08-17 MED ORDER — TICAGRELOR 90 MG PO TABS
90.0000 mg | ORAL_TABLET | Freq: Two times a day (BID) | ORAL | 3 refills | Status: DC
Start: 2020-08-17 — End: 2020-10-19

## 2020-08-17 MED ORDER — ATORVASTATIN CALCIUM 80 MG PO TABS
80.0000 mg | ORAL_TABLET | Freq: Every day | ORAL | 3 refills | Status: DC
Start: 2020-08-17 — End: 2021-06-01

## 2020-08-17 NOTE — Telephone Encounter (Signed)
Pt's medications were sent to pt's pharmacy as requested. Confirmation received.  

## 2020-08-17 NOTE — Telephone Encounter (Signed)
°*  STAT* If patient is at the pharmacy, call can be transferred to refill team.   1. Which medications need to be refilled? (please list name of each medication and dose if known) atorvastatin (LIPITOR) 80 MG tablet, glimepiride (AMARYL) 4 MG tablet, metoprolol tartrate (LOPRESSOR) 25 MG tablet, ticagrelor (BRILINTA) 90 MG TABS tablet  2. Which pharmacy/location (including street and city if local pharmacy) is medication to be sent to? [facility] - [place], [place] - [address]  3. Do they need a 30 day or 90 day supply? 90

## 2020-08-19 ENCOUNTER — Encounter: Payer: Medicare HMO | Admitting: Internal Medicine

## 2020-09-27 NOTE — Progress Notes (Signed)
Phone [phone] In person visit   Subjective:   Paul Paul is a 64 y.o. year old very pleasant male patient who presents for/with See problem oriented charting Chief Complaint  Patient presents with  . Blood sugar check  . Heart Health   This visit occurred during the SARS-CoV-2 public health emergency.  Safety protocols were in place, including screening questions prior to the visit, additional usage of staff PPE, and extensive cleaning of exam room while observing appropriate contact time as indicated for disinfecting solutions.   Past Medical History-  Patient Active Problem List   Diagnosis Date Noted  . CAD (coronary artery disease) status post drug-eluting stent mid LAD August 2021 07/28/2020    Priority: High  . Sinus node dysfunction (HCC) 08/05/2019    Priority: High  . Cerebral aneurysm without rupture 01/01/2012    Priority: High  . Pacemaker 10/07/2011    Priority: High  . Poorly controlled type 2 diabetes mellitus (HCC) 11/09/2008    Priority: High  . Aortic atherosclerosis (HCC) 07/28/2020    Priority: Medium  . Fatty liver 06/18/2018    Priority: Medium  . 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 Elevated LFTs 08/09/2017    Priority: Medium  . GAD (generalized anxiety disorder) 10/11/2016    Priority: Medium  . OSA on CPAP 09/24/2014    Priority: Medium  . Hyperlipidemia associated with type 2 diabetes mellitus (HCC) 09/24/2014    Priority: Medium  . Other specified cardiac dysrhythmias(427.89) 07/09/2012    Priority: Medium  . Hypertension associated with diabetes (HCC) 12/10/2007    Priority: Medium  . Obesity 01/01/2014    Priority: Low  . NSTEMI (non-ST elevated myocardial infarction) (HCC) 07/20/2020  . Chronic pruritic rash in adult 08/09/2017    Medications- reviewed and updated Current Outpatient Medications  Medication Sig Dispense Refill  . aspirin EC 81 MG tablet Take 81 mg by mouth daily.    Marland Kitchen  atorvastatin (LIPITOR) 80 MG tablet Take 1 tablet (80 mg total) by mouth daily. 90 tablet 3  . glimepiride (AMARYL) 4 MG tablet TAKE 2 TABLETS  BY MOUTH DAILY BEFORE BREAKFAST. 180 tablet 1  . metoprolol tartrate (LOPRESSOR) 25 MG tablet Take 1 tablet (25 mg total) by mouth 2 (two) times daily. 180 tablet 3  . nitroGLYCERIN (NITROSTAT) 0.4 MG SL tablet Place 1 tablet (0.4 mg total) under the tongue every 5 (five) minutes as needed for chest pain (up to 3 doses. call for help if doesnt resolve after 1 dose.). 30 tablet 3  . ticagrelor (BRILINTA) 90 MG TABS tablet Take 1 tablet (90 mg total) by mouth 2 (two) times daily. 180 tablet 3  . losartan (COZAAR) 25 MG tablet Take 1 tablet (25 mg total) by mouth daily. (Patient not taking: Reported on 09/28/2020) 30 tablet 3   No current facility-administered medications for this visit.     Objective:  BP 124/68   Pulse 60   Temp 98 F (36.7 C) (Temporal)   Resp 18   Ht 6\' 3"  (1.905 m)   Wt 262 lb 3.2 oz (118.9 kg)   SpO2 96%   BMI 32.77 kg/m  Gen: NAD, resting comfortably CV: RRR no murmurs rubs or gallops Lungs: CTAB no crackles, wheeze, rhonchi Ext: trace edema Skin: warm, dry     Assessment and Plan   #Diabetes mellitus-poor control. Metformin intolerant- GI issues. Declines relion 01/20/2019 and 10/02/2019 S: compliant with 8mg  glimepiride daily.  Large  swings in sugars in past as far as control with improved diet/exercise.  CBGs- 130-150 in mornings, 200 if really aggrivated Exercise and diet- exercise 5-6 days a week Lab Results  Component Value Date   HGBA1C 8.7 (A) 09/28/2020   HGBA1C 10.4 (H) 07/20/2020   HGBA1C 9.4 (H) 10/02/2019  A/P: improved today with POC a1c of 8.7 (likely around 9.2 on phlebotomy). Continue 8 mg glimepiride. Unfortunately does not tolerate metformin and other options are cost prohibitive for him as tier 3.  - he strongly prefers to work on diet/exercise to get this down and we agreed to 14 week follow  up to recheck    #Hypertension S: Controlled on metoprolol 25 mg BID A/P: well controlled- continue current rx -patient did not start losartan but blood pressure well controlled- he is going to ask cardiology next visit if they want him to restart  #CAD- asymptomatic with no chest pain or shortness of breath #Hyperlipidemia/elevated LFTs S: Patient has been off of statin due to elevated LFTs.  History of hepatotoxicity due to statins. Last LFTs still high mildly. Cardiology restarted atorvastatin 80mg  08/2020 due to CAD/NSTEMI Lab Results  Component Value Date   CHOL 179 07/21/2020   HDL 40 (L) 07/21/2020   LDLCALC 111 (H) 07/21/2020   LDLDIRECT 104.0 08/09/2017   TRIG 140 07/21/2020   CHOLHDL 4.5 07/21/2020  A/P: lipids- hopefully LDL below 70 at least with CAD history- under 55 would be even better. Check LFTs hoping have not jumped up back on higher dose statin.   For CAD- continue current medicine with atorvastatin 80mg , brilinta 90 mg BID and aspirin 81 mg  Recommended follow up: Return in about 14 weeks (around 01/04/2021) for physical or sooner if needed. Future Appointments  Date Time Provider Department Center  10/08/2020 11:45 AM 03/04/2021, MD [facility]  10/19/2020 10:00 AM Marinus Maw, MD [facility]    Lab/Order associations:   ICD-10-CM   1. Hypertension associated with diabetes (HCC)  E11.59    I15.2   2. Poorly controlled type 2 diabetes mellitus (HCC)  E11.65 POCT HgB A1C  3. Hyperlipidemia associated with type 2 diabetes mellitus (HCC)  E11.69 COMPLETE METABOLIC PANEL WITH GFR   E78.5 Lipid Panel w/reflex Direct LDL  4. Coronary artery disease involving native coronary artery of native heart without angina pectoris  I25.10    Return precautions advised.  10/21/2020, MD

## 2020-09-27 NOTE — Patient Instructions (Addendum)
Health Maintenance Due  Topic Date Due  . COLONOSCOPY at least a year out from NSTEMI 02/24/2017  . OPHTHALMOLOGY EXAM Has not been scheduled- encouraged patient to call to schedule 12/28/2017  . PNA vac Low Risk Adult (2 of 2 - PPSV23)-  Next visit 04/06/2020  . INFLUENZA VACCINE In office flu shot today 07/04/2020   Strongly consider booster for covid 19  Discussed adding medicine for diabetes but you declined for now. You wanted to continue excellent exercise and buckle down on healthy eating with 14 week physical for recheck. Great job getting a1c going in the right direction at least down from 10.4 to likely adjusted a1c of 9.2 (poc was 8.7 but this runs lower than true bloodwork)   Please stop by lab before you go If you have mychart- we will send your results within 3 business days of [place] receiving them.  If you do not have mychart- we will call you about results within 5 business days of [place] receiving them.  *please note we are currently using [facility] which has a longer processing time than [place] typically so labs may not come back as quickly as in the past *please also note that you will see labs on mychart as soon as they post. I will later go in and write notes on them- will say "notes from Dr. Durene Cal"

## 2020-09-28 ENCOUNTER — Other Ambulatory Visit: Payer: Self-pay

## 2020-09-28 ENCOUNTER — Ambulatory Visit (INDEPENDENT_AMBULATORY_CARE_PROVIDER_SITE_OTHER): Payer: Medicare HMO | Admitting: Family Medicine

## 2020-09-28 ENCOUNTER — Other Ambulatory Visit: Payer: Self-pay | Admitting: Family Medicine

## 2020-09-28 ENCOUNTER — Encounter: Payer: Self-pay | Admitting: Family Medicine

## 2020-09-28 VITALS — BP 124/68 | HR 60 | Temp 98.0°F | Resp 18 | Ht 75.0 in | Wt 262.2 lb

## 2020-09-28 DIAGNOSIS — Z23 Encounter for immunization: Secondary | ICD-10-CM | POA: Diagnosis not present

## 2020-09-28 DIAGNOSIS — E1169 Type 2 diabetes mellitus with other specified complication: Secondary | ICD-10-CM

## 2020-09-28 DIAGNOSIS — E1165 Type 2 diabetes mellitus with hyperglycemia: Secondary | ICD-10-CM

## 2020-09-28 DIAGNOSIS — I2510 Atherosclerotic heart disease of native coronary artery without angina pectoris: Secondary | ICD-10-CM | POA: Diagnosis not present

## 2020-09-28 DIAGNOSIS — E1159 Type 2 diabetes mellitus with other circulatory complications: Secondary | ICD-10-CM

## 2020-09-28 DIAGNOSIS — I152 Hypertension secondary to endocrine disorders: Secondary | ICD-10-CM | POA: Diagnosis not present

## 2020-09-28 DIAGNOSIS — E785 Hyperlipidemia, unspecified: Secondary | ICD-10-CM

## 2020-09-28 LAB — POCT GLYCOSYLATED HEMOGLOBIN (HGB A1C): Hemoglobin A1C: 8.7 % — AB (ref 4.0–5.6)

## 2020-09-29 ENCOUNTER — Encounter (HOSPITAL_COMMUNITY): Payer: Self-pay | Admitting: Internal Medicine

## 2020-09-29 LAB — COMPLETE METABOLIC PANEL WITH GFR
AG Ratio: 1 (calc) (ref 1.0–2.5)
ALT: 33 U/L (ref 9–46)
AST: 32 U/L (ref 10–35)
Albumin: 3.6 g/dL (ref 3.6–5.1)
Alkaline phosphatase (APISO): 77 U/L (ref 35–144)
BUN: 8 mg/dL (ref 7–25)
CO2: 24 mmol/L (ref 20–32)
Calcium: 9.7 mg/dL (ref 8.6–10.3)
Chloride: 105 mmol/L (ref 98–110)
Creat: 0.99 mg/dL (ref 0.70–1.25)
GFR, Est African American: 92 mL/min/{1.73_m2} (ref >=60)
GFR, Est Non African American: 80 mL/min/{1.73_m2} (ref >=60)
Globulin: 3.7 g/dL (calc) (ref 1.9–3.7)
Glucose, Bld: 226 mg/dL — ABNORMAL HIGH (ref 65–99)
Potassium: 4 mmol/L (ref 3.5–5.3)
Sodium: 136 mmol/L (ref 135–146)
Total Bilirubin: 1.1 mg/dL (ref 0.2–1.2)
Total Protein: 7.3 g/dL (ref 6.1–8.1)

## 2020-09-29 LAB — LIPID PANEL W/REFLEX DIRECT LDL
Cholesterol: 97 mg/dL (ref <200)
HDL: 38 mg/dL — ABNORMAL LOW (ref >=40)
LDL Cholesterol (Calc): 39 mg/dL (calc)
Non-HDL Cholesterol (Calc): 59 mg/dL (calc) (ref <130)
Total CHOL/HDL Ratio: 2.6 (calc) (ref <5.0)
Triglycerides: 115 mg/dL (ref <150)

## 2020-10-08 ENCOUNTER — Encounter: Payer: Self-pay | Admitting: Internal Medicine

## 2020-10-08 ENCOUNTER — Ambulatory Visit (INDEPENDENT_AMBULATORY_CARE_PROVIDER_SITE_OTHER): Payer: Medicare HMO | Admitting: Internal Medicine

## 2020-10-08 ENCOUNTER — Other Ambulatory Visit: Payer: Self-pay

## 2020-10-08 ENCOUNTER — Ambulatory Visit: Payer: Medicare HMO

## 2020-10-08 DIAGNOSIS — I495 Sick sinus syndrome: Secondary | ICD-10-CM | POA: Diagnosis not present

## 2020-10-08 LAB — CUP PACEART INCLINIC DEVICE CHECK
Battery Remaining Longevity: 33 mo
Battery Voltage: 2.8 V
Brady Statistic RA Percent Paced: 77 %
Brady Statistic RV Percent Paced: 0.09 %
Date Time Interrogation Session: 20211105120215
Implantable Lead Implant Date: 20120801
Implantable Lead Implant Date: 20120801
Implantable Lead Location: 753859
Implantable Lead Location: 753860
Implantable Pulse Generator Implant Date: 20120801
Lead Channel Impedance Value: 487.5 Ohm
Lead Channel Impedance Value: 525 Ohm
Lead Channel Pacing Threshold Amplitude: 0.75 V
Lead Channel Pacing Threshold Amplitude: 0.75 V
Lead Channel Pacing Threshold Amplitude: 1 V
Lead Channel Pacing Threshold Pulse Width: 0.4 ms
Lead Channel Pacing Threshold Pulse Width: 0.4 ms
Lead Channel Pacing Threshold Pulse Width: 0.4 ms
Lead Channel Sensing Intrinsic Amplitude: 5 mV
Lead Channel Sensing Intrinsic Amplitude: 9.4 mV
Lead Channel Setting Pacing Amplitude: 1.25 V
Lead Channel Setting Pacing Amplitude: 2 V
Lead Channel Setting Pacing Pulse Width: 0.4 ms
Lead Channel Setting Sensing Sensitivity: 2 mV
Pulse Gen Model: 2210
Pulse Gen Serial Number: 7250770

## 2020-10-08 NOTE — Patient Instructions (Signed)
Medication Instructions:  Your physician recommends that you continue on your current medications as directed. Please refer to the Current Medication list given to you today.  Labwork: None ordered.  Testing/Procedures: None ordered.  Follow-Up:  Your physician wants you to follow-up in: 6 months with the device clinic.   You will receive a reminder letter in the mail two months in advance. If you don't receive a letter, please call our office to schedule the follow-up appointment.  Your physician wants you to follow-up in: one year with Dr. Taylor.   You will receive a reminder letter in the mail two months in advance. If you don't receive a letter, please call our office to schedule the follow-up appointment.  Any Other Special Instructions Will Be Listed Below (If Applicable).  If you need a refill on your cardiac medications before your next appointment, please call your pharmacy.   

## 2020-10-08 NOTE — Progress Notes (Signed)
HPI [name] returns today for followup. He is a pleasant 64yo man with symptomatic bradycardia, s/p PPM, HTN, obesity and a cerebral aneurysm, status post treatment. He denies chest pain or sob. He has been under some increased stress and sustained an anterior MI several weeks ago. He is s/p PCI. He denies chest pain or sob. No syncope.  Allergies  Allergen Reactions  . Doxycycline Rash and Nausea Only  . Lisinopril Rash  . Losartan Potassium-Hctz Other (See Comments)    Caused dizziness & fatigue  . Prednisone Other (See Comments)    Raises blood sugar     Current Outpatient Medications  Medication Sig Dispense Refill  . aspirin EC 81 MG tablet Take 81 mg by mouth daily.    Marland Kitchen atorvastatin (LIPITOR) 80 MG tablet Take 1 tablet (80 mg total) by mouth daily. 90 tablet 3  . glimepiride (AMARYL) 4 MG tablet TAKE 2 TABLETS  BY MOUTH DAILY BEFORE BREAKFAST. 180 tablet 1  . metoprolol tartrate (LOPRESSOR) 25 MG tablet Take 1 tablet (25 mg total) by mouth 2 (two) times daily. 180 tablet 3  . nitroGLYCERIN (NITROSTAT) 0.4 MG SL tablet Place 1 tablet (0.4 mg total) under the tongue every 5 (five) minutes as needed for chest pain (up to 3 doses. call for help if doesnt resolve after 1 dose.). 30 tablet 3  . ticagrelor (BRILINTA) 90 MG TABS tablet Take 1 tablet (90 mg total) by mouth 2 (two) times daily. 180 tablet 3   No current facility-administered medications for this visit.     Past Medical History:  Diagnosis Date  . Anxiety   . Arthritis   . Bell palsy    > 30 years. Left side of face droops a little  . Bradycardia   . Cerebral aneurysm   . Cerebral aneurysm without rupture 06/2011   Middle of Cerebral Artery  . Complication of anesthesia 2012   after surgery for cliping of cerebral anerysum- In the room, patient was shaking, and patient would awaken and then "go back out" shaking continued after he "went back out:  . Constipation   . Diabetes mellitus    now under  control  . Dysrhythmia    BRADYCARDIA REQUIRING PACEMAKER .FOLLOWED BY [facility]  . HYPERTENSION 12/10/2007  . Pacemaker   . Shoulder pain    L SHOULDER. RECENT FALL  . Sleep apnea    CPAP.NOT WEARING NOW.NEEDS REPLACEMENT PIECE    ROS:   All systems reviewed and negative except as noted in the HPI.   Past Surgical History:  Procedure Laterality Date  . CARDIAC CATHETERIZATION  07/06/11   normal  . CEREBRAL ANEURYSM REPAIR  06/2011   "stenting and coiling" per patient  . CORONARY STENT INTERVENTION N/A 07/21/2020   Procedure: CORONARY STENT INTERVENTION;  Surgeon: Iran Ouch, MD;  Location: [facility];  Service: Cardiovascular;  Laterality: N/A;  . FRACTURE SURGERY Left    foot  . INSERT / REPLACE / REMOVE PACEMAKER  2012  . KNEE SURGERY Left    left; arthoscopic  . LEFT HEART CATH AND CORONARY ANGIOGRAPHY N/A 07/21/2020   Procedure: LEFT HEART CATH AND CORONARY ANGIOGRAPHY;  Surgeon: Iran Ouch, MD;  Location: [facility];  Service: Cardiovascular;  Laterality: N/A;  . rotator cuff surgery Left 02/2012  . septal deviation    . SHOULDER ARTHROSCOPY WITH SUBACROMIAL DECOMPRESSION, ROTATOR CUFF REPAIR AND BICEP TENDON REPAIR Right 08/24/2015   Procedure: RIGHT  SHOULDER DIAGNOSTIC OPERATIVE ARTHROSCOPY WITH DEBRIDEMENT, SUBACROMIAL DECOMPRESSION, MIMI-OPEN ROTATOR CUFF TEAR REPAIR AND POSSIBLE  BICEPS TENODESIS.  ;  Surgeon: Cammy Copa, MD;  Location: [facility];  Service: Orthopedics;  Laterality: Right;  RIGHT SHOULDER DIAGNOSTIC OPERATIVE ARTHROSCOPY, DEBRIDEMENT, SUBACROMIAL DECOMPRESSION, MINI-OPEN ROTATOR CUFF TEAR REPAIR, POSSIB     Family History  Problem Relation Age of Onset  . Heart attack Mother        died 97, smoker  . Heart disease Mother   . Diabetes Mother   . Heart attack Father        died 23, smoker  . Heart disease Father      Social History   Socioeconomic History  . Marital status: Married    Spouse name: Not on file   . Number of children: Not on file  . Years of education: Not on file  . Highest education level: Not on file  Occupational History  . Not on file  Tobacco Use  . Smoking status: Former Smoker    Packs/day: 1.00    Years: 20.00    Pack years: 20.00    Types: Cigarettes    Quit date: 07/05/2011    Years since quitting: 9.2  . Smokeless tobacco: Never Used  Vaping Use  . Vaping Use: Never used  Substance and Sexual Activity  . Alcohol use: No    Alcohol/week: 0.0 standard drinks  . Drug use: No  . Sexual activity: Not on file  Other Topics Concern  . Not on file  Social History Narrative   Married over 30 years in 2015. 1 step son. 1 grandson.       Disabled from brain aneurysm, pacemaker. Retired from [employer] years.       Hobbies: grandson, exercise at [facility]   Social Determinants of Health   Financial Resource Strain:   . Difficulty of Paying Living Expenses: Not on file  Food Insecurity:   . Worried About Programme researcher, broadcasting/film/video in the Last Year: Not on file  . Ran Out of Food in the Last Year: Not on file  Transportation Needs:   . Lack of Transportation (Medical): Not on file  . Lack of Transportation (Non-Medical): Not on file  Physical Activity:   . Days of Exercise per Week: Not on file  . Minutes of Exercise per Session: Not on file  Stress:   . Feeling of Stress : Not on file  Social Connections:   . Frequency of Communication with Friends and Family: Not on file  . Frequency of Social Gatherings with Friends and Family: Not on file  . Attends Religious Services: Not on file  . Active Member of Clubs or Organizations: Not on file  . Attends Banker Meetings: Not on file  . Marital Status: Not on file  Intimate Partner Violence:   . Fear of Current or Ex-Partner: Not on file  . Emotionally Abused: Not on file  . Physically Abused: Not on file  . Sexually Abused: Not on file     BP 126/72   Pulse 60   Ht 6\' 3"  (1.905 m)   Wt 266 lb  3.2 oz (120.7 kg)   SpO2 95%   BMI 33.27 kg/m   Physical Exam:  Well appearing NAD HEENT: Unremarkable Neck:  No JVD, no thyromegally Lymphatics:  No adenopathy Back:  No CVA tenderness Lungs:  Clear with no wheezes HEART:  Regular rate rhythm, no murmurs, no rubs, no clicks Abd:  soft,  positive bowel sounds, no organomegally, no rebound, no guarding Ext:  2 plus pulses, no edema, no cyanosis, no clubbing Skin:  No rashes no nodules Neuro:  CN II through XII intact, motor grossly intact  DEVICE  Normal device function.  See PaceArt for details.   Assess/Plan: 1. Sinus node dysfunction - he is s/p PPM insertion.  2. CAD - he is s/p recent anterior MI. He denies anginal symptoms and feels well. 3. Obesity - his weight is neither up or down.  4. PPM - his [employer] DDD PM is working normally. We will recheck in several months.  Sharlot Gowda Norelle Runnion,MD

## 2020-10-11 ENCOUNTER — Ambulatory Visit (INDEPENDENT_AMBULATORY_CARE_PROVIDER_SITE_OTHER): Payer: Medicare HMO

## 2020-10-11 DIAGNOSIS — Z0000 Encounter for general adult medical examination without abnormal findings: Secondary | ICD-10-CM

## 2020-10-11 NOTE — Progress Notes (Signed)
Virtual Visit via Telephone Note  I connected with  Paul Brookearl W Lafont on 10/11/20 at  3:15 PM EST by telephone and verified that I am speaking with the correct person using two identifiers.  Medicare Annual Wellness visit completed telephonically due to Covid-19 pandemic.   Persons participating in this call: This Health Coach and this patient.   Location: Patient: Home Provider: Office   I discussed the limitations, risks, security and privacy concerns of performing an evaluation and management service by telephone and the availability of in person appointments. The patient expressed understanding and agreed to proceed.  Unable to perform video visit due to video visit attempted and failed and/or patient does not have video capability.   Some vital signs may be absent or patient reported.   Marzella Schleinina H Merel Santoli, LPN    Subjective:   Paul Paul is a 64 y.o. male who presents for Medicare Annual/Subsequent preventive examination.  Review of Systems     Cardiac Risk Factors include: advanced age (>4555men, 58>65 women);male gender;hypertension;diabetes mellitus;dyslipidemia;obesity (BMI >30kg/m2)     Objective:    There were no vitals filed for this visit. There is no height or weight on file to calculate BMI.  Advanced Directives 10/11/2020 10/02/2019 01/02/2019 02/13/2018 07/25/2013 11/29/2012 09/04/2012  Does Patient Have a Medical Advance Directive? No Yes No No Patient does not have advance directive Patient does not have advance directive Patient does not have advance directive  Type of Advance Directive - Living will;Healthcare Power of Attorney - - - - -  Does patient want to make changes to medical advance directive? - No - Patient declined - - - - -  Copy of Healthcare Power of Attorney in Chart? - No - copy requested - - - - -  Would patient like information on creating a medical advance directive? No - Patient declined - No - Patient declined No - Patient declined - - -    Pre-existing out of facility DNR order (yellow form or pink MOST form) - - - - - - No    Current Medications (verified) Outpatient Encounter Medications as of 10/11/2020  Medication Sig  . aspirin EC 81 MG tablet Take 81 mg by mouth daily.  Marland Kitchen. atorvastatin (LIPITOR) 80 MG tablet Take 1 tablet (80 mg total) by mouth daily.  Marland Kitchen. glimepiride (AMARYL) 4 MG tablet TAKE 2 TABLETS  BY MOUTH DAILY BEFORE BREAKFAST.  . metoprolol tartrate (LOPRESSOR) 25 MG tablet Take 1 tablet (25 mg total) by mouth 2 (two) times daily.  . ticagrelor (BRILINTA) 90 MG TABS tablet Take 1 tablet (90 mg total) by mouth 2 (two) times daily.  . nitroGLYCERIN (NITROSTAT) 0.4 MG SL tablet Place 1 tablet (0.4 mg total) under the tongue every 5 (five) minutes as needed for chest pain (up to 3 doses. call for help if doesnt resolve after 1 dose.). (Patient not taking: Reported on 10/11/2020)   No facility-administered encounter medications on file as of 10/11/2020.    Allergies (verified) Doxycycline, Lisinopril, Losartan potassium-hctz, and Prednisone   History: Past Medical History:  Diagnosis Date  . Anxiety   . Arthritis   . Bell palsy    > 30 years. Left side of face droops a little  . Bradycardia   . Cerebral aneurysm   . Cerebral aneurysm without rupture 06/2011   Middle of Cerebral Artery  . Complication of anesthesia 2012   after surgery for cliping of cerebral anerysum- In the room, patient was shaking, and patient would awaken  and then "go back out" shaking continued after he "went back out:  . Constipation   . Diabetes mellitus    now under control  . Dysrhythmia    BRADYCARDIA REQUIRING PACEMAKER .FOLLOWED BY [facility]  . HYPERTENSION 12/10/2007  . Pacemaker   . Shoulder pain    L SHOULDER. RECENT FALL  . Sleep apnea    CPAP.NOT WEARING NOW.NEEDS REPLACEMENT PIECE   Past Surgical History:  Procedure Laterality Date  . CARDIAC CATHETERIZATION  07/06/11   normal  . CEREBRAL ANEURYSM REPAIR  06/2011    "stenting and coiling" per patient  . CORONARY STENT INTERVENTION N/A 07/21/2020   Procedure: CORONARY STENT INTERVENTION;  Surgeon: Iran Ouch, MD;  Location: [facility];  Service: Cardiovascular;  Laterality: N/A;  . FRACTURE SURGERY Left    foot  . INSERT / REPLACE / REMOVE PACEMAKER  2012  . KNEE SURGERY Left    left; arthoscopic  . LEFT HEART CATH AND CORONARY ANGIOGRAPHY N/A 07/21/2020   Procedure: LEFT HEART CATH AND CORONARY ANGIOGRAPHY;  Surgeon: Iran Ouch, MD;  Location: [facility];  Service: Cardiovascular;  Laterality: N/A;  . rotator cuff surgery Left 02/2012  . septal deviation    . SHOULDER ARTHROSCOPY WITH SUBACROMIAL DECOMPRESSION, ROTATOR CUFF REPAIR AND BICEP TENDON REPAIR Right 08/24/2015   Procedure: RIGHT SHOULDER DIAGNOSTIC OPERATIVE ARTHROSCOPY WITH DEBRIDEMENT, SUBACROMIAL DECOMPRESSION, MIMI-OPEN ROTATOR CUFF TEAR REPAIR AND POSSIBLE  BICEPS TENODESIS.  ;  Surgeon: Cammy Copa, MD;  Location: [facility];  Service: Orthopedics;  Laterality: Right;  RIGHT SHOULDER DIAGNOSTIC OPERATIVE ARTHROSCOPY, DEBRIDEMENT, SUBACROMIAL DECOMPRESSION, MINI-OPEN ROTATOR CUFF TEAR REPAIR, POSSIB   Family History  Problem Relation Age of Onset  . Heart attack Mother        died 54, smoker  . Heart disease Mother   . Diabetes Mother   . Heart attack Father        died 55, smoker  . Heart disease Father    Social History   Socioeconomic History  . Marital status: Married    Spouse name: Not on file  . Number of children: Not on file  . Years of education: Not on file  . Highest education level: Not on file  Occupational History  . Occupation: Disabled  Tobacco Use  . Smoking status: Former Smoker    Packs/day: 1.00    Years: 20.00    Pack years: 20.00    Types: Cigarettes    Quit date: 07/05/2011    Years since quitting: 9.2  . Smokeless tobacco: Never Used  Vaping Use  . Vaping Use: Never used  Substance and Sexual Activity  . Alcohol  use: No    Alcohol/week: 0.0 standard drinks  . Drug use: No  . Sexual activity: Not on file  Other Topics Concern  . Not on file  Social History Narrative   Married over 30 years in 2015. 1 step son. 1 grandson.       Disabled from brain aneurysm, pacemaker. Retired from [employer] years.       Hobbies: grandson, exercise at [facility]   Social Determinants of Health   Financial Resource Strain: Low Risk   . Difficulty of Paying Living Expenses: Not hard at all  Food Insecurity: No Food Insecurity  . Worried About Programme researcher, broadcasting/film/video in the Last Year: Never true  . Ran Out of Food in the Last Year: Never true  Transportation Needs: No Transportation Needs  . Lack  of Transportation (Medical): No  . Lack of Transportation (Non-Medical): No  Physical Activity: Sufficiently Active  . Days of Exercise per Week: 5 days  . Minutes of Exercise per Session: 90 min  Stress: No Stress Concern Present  . Feeling of Stress : Not at all  Social Connections: Moderately Integrated  . Frequency of Communication with Friends and Family: Once a week  . Frequency of Social Gatherings with Friends and Family: Never  . Attends Religious Services: More than 4 times per year  . Active Member of Clubs or Organizations: Yes  . Attends Banker Meetings: 1 to 4 times per year  . Marital Status: Married    Tobacco Counseling Counseling given: Not Answered   Clinical Intake:  Pre-visit preparation completed: Yes  Pain : No/denies pain     BMI - recorded: 33.27 Nutritional Status: BMI > 30  Obese Nutritional Risks: None Diabetes: Yes CBG done?: Yes (180) CBG resulted in Enter/ Edit results?: No Did pt. bring in CBG monitor from home?: No  How often do you need to have someone help you when you read instructions, pamphlets, or other written materials from your doctor or pharmacy?: 1 - Never  Diabetic?Nutrition Risk Assessment:  Has the patient had any N/V/D within the last  2 months?  No  Does the patient have any non-healing wounds?  No  Has the patient had any unintentional weight loss or weight gain?  No   Diabetes:  Is the patient diabetic?  Yes  If diabetic, was a CBG obtained today?  Yes  Did the patient bring in their glucometer from home?  No  How often do you monitor your CBG's? Daily.   Financial Strains and Diabetes Management:  Are you having any financial strains with the device, your supplies or your medication? No .  Does the patient want to be seen by Chronic Care Management for management of their diabetes?  No  Would the patient like to be referred to a Nutritionist or for Diabetic Management?  No   Diabetic Exams:  Diabetic Eye Exam: Overdue for diabetic eye exam. Pt has been advised about the importance in completing this exam. Patient advised to call and schedule an eye exam.   Diabetic Foot Exam: Overdue, Pt has been advised about the importance in completing this exam. Pt is scheduled for diabetic foot exam on Pt states he will make appt for physical, no date specified at this time.  Interpreter Needed?: No  Information entered by :: Lanier Ensign, LPN   Activities of Daily Living In your present state of health, do you have any difficulty performing the following activities: 10/11/2020  Hearing? N  Vision? N  Difficulty concentrating or making decisions? N  Walking or climbing stairs? N  Dressing or bathing? N  Doing errands, shopping? N  Preparing Food and eating ? N  Using the Toilet? N  In the past six months, have you accidently leaked urine? N  Do you have problems with loss of bowel control? N  Managing your Medications? N  Managing your Finances? N  Housekeeping or managing your Housekeeping? N  Some recent data might be hidden    Patient Care Team: Shelva Majestic, MD as PCP - General (Family Medicine) Christell Constant, MD as PCP - Cardiology (Internal Medicine) Jethro Bolus, MD as Consulting  Physician (Ophthalmology) Julieanne Cotton, MD as Consulting Physician (Interventional Radiology) Marinus Maw, MD as Consulting Physician (Cardiology)  Indicate any recent Medical Services  you may have received from other than [facility] providers in the past year (date may be approximate).     Assessment:   This is a routine wellness examination for Eddy.  Hearing/Vision screen  Hearing Screening   125Hz  250Hz  500Hz  1000Hz  2000Hz  3000Hz  4000Hz  6000Hz  8000Hz   Right ear:           Left ear:           Comments: Pt hearing denies any issues   Vision Screening Comments: Pt was following up with Dr and no longer attends  Dietary issues and exercise activities discussed: Current Exercise Habits: Home exercise routine, Type of exercise: walking;strength training/weights;treadmill, Time (Minutes): > 60, Frequency (Times/Week): 5, Weekly Exercise (Minutes/Week): 0  Goals    . Patient Stated     Keep blood sugar down       Depression Screen PHQ 2/9 Scores 10/11/2020 10/02/2019 06/10/2019 09/18/2018 07/12/2017  PHQ - 2 Score 0 0 0 0 0    Fall Risk Fall Risk  10/11/2020 07/28/2020 10/02/2019 09/18/2018 07/12/2017  Falls in the past year? 0 0 0 No No  Number falls in past yr: 0 - - - -  Injury with Fall? 0 0 0 - -  Risk for fall due to : Impaired vision - - - -  Follow up Falls prevention discussed - Falls evaluation completed;Education provided;Falls prevention discussed - -    Any stairs in or around the home? Yes  If so, are there any without handrails? No  Home free of loose throw rugs in walkways, pet beds, electrical cords, etc? Yes  Adequate lighting in your home to reduce risk of falls? Yes   ASSISTIVE DEVICES UTILIZED TO PREVENT FALLS:  Life alert? No  Use of a cane, walker or w/c? No  Grab bars in the bathroom? No  Shower chair or bench in shower? No  Elevated toilet seat or a handicapped toilet? No   TIMED UP AND GO:  Was the test performed? No .     Cognitive  Function: Declines 6CIT test        Immunizations Immunization History  Administered Date(s) Administered  . Fluad Quad(high Dose 65+) 10/02/2019, 09/28/2020  . Influenza Split 11/06/2011, 09/05/2012  . Influenza,inj,Quad PF,6+ Mos 09/24/2014, 09/18/2018  . Janssen (J&J) SARS-COV-2 Vaccination 02/15/2020  . Pneumococcal Conjugate-13 04/23/2018  . Pneumococcal Polysaccharide-23 07/26/2012  . Tdap 01/01/2012    TDAP status: Up to date   Flu Vaccine status: Up to date Done 09/28/20  Pneumococcal vaccine status: Up to date   Covid-19 vaccine status: Completed vaccines  Qualifies for Shingles Vaccine? Yes   Zostavax completed No   Shingrix Completed?: No.    Education has been provided regarding the importance of this vaccine. Patient has been advised to call insurance company to determine out of pocket expense if they have not yet received this vaccine. Advised may also receive vaccine at local pharmacy or Health Dept. Verbalized acceptance and understanding.  Screening Tests Health Maintenance  Topic Date Due  . FOOT EXAM  12/06/2020 (Originally 10/01/2020)  . OPHTHALMOLOGY EXAM  12/06/2020 (Originally 12/28/2017)  . URINE MICROALBUMIN  12/06/2020 (Originally 10/01/2020)  . PNA vac Low Risk Adult (2 of 2 - PPSV23) 12/06/2020 (Originally 04/06/2020)  . HEMOGLOBIN A1C  03/29/2021  . TETANUS/TDAP  12/31/2021  . INFLUENZA VACCINE  Completed  . COVID-19 Vaccine  Completed  . Hepatitis C Screening  Completed  . HIV Screening  Completed  . COLONOSCOPY  Discontinued  Health Maintenance  There are no preventive care reminders to display for this patient.  Colorectal cancer screening: No longer required.   Per patient   Additional Screening:  Hepatitis C Screening: Completed 04/15/18  Vision Screening: Recommended annual ophthalmology exams for early detection of glaucoma and other disorders of the eye. Is the patient up to date with their annual eye exam?  No  Who is the  provider or what is the name of the office in which the patient attends annual eye exams? Was a Patient of Dr Nile Riggs no longer follows up with eye care per pt     Dental Screening: Recommended annual dental exams for proper oral hygiene  Community Resource Referral / Chronic Care Management: CRR required this visit?  No   CCM required this visit?  No      Plan:     I have personally reviewed and noted the following in the patient's chart:   . Medical and social history . Use of alcohol, tobacco or illicit drugs  . Current medications and supplements . Functional ability and status . Nutritional status . Physical activity . Advanced directives . List of other physicians . Hospitalizations, surgeries, and ER visits in previous 12 months . Vitals . Screenings to include cognitive, depression, and falls . Referrals and appointments  In addition, I have reviewed and discussed with patient certain preventive protocols, quality metrics, and best practice recommendations. A written personalized care plan for preventive services as well as general preventive health recommendations were provided to patient.     Marzella Schlein, LPN   [id number]   Nurse Notes: None

## 2020-10-11 NOTE — Patient Instructions (Addendum)
Paul Paul , Thank you for taking time to come for your Medicare Wellness Visit. I appreciate your ongoing commitment to your health goals. Please review the following plan we discussed and let me know if I can assist you in the future.   Screening recommendations/referrals: Colonoscopy: Declined Recommended yearly ophthalmology/optometry visit for glaucoma screening and checkup Recommended yearly dental visit for hygiene and checkup  Vaccinations: Influenza vaccine: Up to date Done 09/28/20 Pneumococcal vaccine: Done 07/26/12 & 04/23/18 Tdap vaccine: Up to date Shingles vaccine: Shingrix discussed. Please contact your pharmacy for coverage information.    Covid-19: Completed 02/15/20  Advanced directives: Advance directive discussed with you today. Even though you declined this today please call our office should you change your mind and we can give you the proper paperwork for you to fill out.  Conditions/risks identified: Keep blood sugar down  Next appointment: Follow up in one year for your annual wellness visit.   Preventive Care 65 Years and Older, Male Preventive care refers to lifestyle choices and visits with your health care provider that can promote health and wellness. What does preventive care include?  A yearly physical exam. This is also called an annual well check.  Dental exams once or twice a year.  Routine eye exams. Ask your health care provider how often you should have your eyes checked.  Personal lifestyle choices, including:  Daily care of your teeth and gums.  Regular physical activity.  Eating a healthy diet.  Avoiding tobacco and drug use.  Limiting alcohol use.  Practicing safe sex.  Taking low doses of aspirin every day.  Taking vitamin and mineral supplements as recommended by your health care provider. What happens during an annual well check? The services and screenings done by your health care provider during your annual well check will  depend on your age, overall health, lifestyle risk factors, and family history of disease. Counseling  Your health care provider may ask you questions about your:  Alcohol use.  Tobacco use.  Drug use.  Emotional well-being.  Home and relationship well-being.  Sexual activity.  Eating habits.  History of falls.  Memory and ability to understand (cognition).  Work and work Astronomer. Screening  You may have the following tests or measurements:  Height, weight, and BMI.  Blood pressure.  Lipid and cholesterol levels. These may be checked every 5 years, or more frequently if you are over 65 years old.  Skin check.  Lung cancer screening. You may have this screening every year starting at age 65 if you have a 30-pack-year history of smoking and currently smoke or have quit within the past 15 years.  Fecal occult blood test (FOBT) of the stool. You may have this test every year starting at age 65.  Flexible sigmoidoscopy or colonoscopy. You may have a sigmoidoscopy every 5 years or a colonoscopy every 10 years starting at age 65.  Prostate cancer screening. Recommendations will vary depending on your family history and other risks.  Hepatitis C blood test.  Hepatitis B blood test.  Sexually transmitted disease (STD) testing.  Diabetes screening. This is done by checking your blood sugar (glucose) after you have not eaten for a while (fasting). You may have this done every 1-3 years.  Abdominal aortic aneurysm (AAA) screening. You may need this if you are a current or former smoker.  Osteoporosis. You may be screened starting at age 65 if you are at high risk. Talk with your health care provider about your test  results, treatment options, and if necessary, the need for more tests. Vaccines  Your health care provider may recommend certain vaccines, such as:  Influenza vaccine. This is recommended every year.  Tetanus, diphtheria, and acellular pertussis (Tdap,  Td) vaccine. You may need a Td booster every 10 years.  Zoster vaccine. You may need this after age 65.  Pneumococcal 13-valent conjugate (PCV13) vaccine. One dose is recommended after age 65.  Pneumococcal polysaccharide (PPSV23) vaccine. One dose is recommended after age 65. Talk to your health care provider about which screenings and vaccines you need and how often you need them. This information is not intended to replace advice given to you by your health care provider. Make sure you discuss any questions you have with your health care provider. Document Released: 12/17/2015 Document Revised: 08/09/2016 Document Reviewed: 09/21/2015 Elsevier Interactive Patient Education  2017 [employer] Prevention in the Home Falls can cause injuries. They can happen to people of all ages. There are many things you can do to make your home safe and to help prevent falls. What can I do on the outside of my home?  Regularly fix the edges of walkways and driveways and fix any cracks.  Remove anything that might make you trip as you walk through a door, such as a raised step or threshold.  Trim any bushes or trees on the path to your home.  Use bright outdoor lighting.  Clear any walking paths of anything that might make someone trip, such as rocks or tools.  Regularly check to see if handrails are loose or broken. Make sure that both sides of any steps have handrails.  Any raised decks and porches should have guardrails on the edges.  Have any leaves, snow, or ice cleared regularly.  Use sand or salt on walking paths during winter.  Clean up any spills in your garage right away. This includes oil or grease spills. What can I do in the bathroom?  Use night lights.  Install grab bars by the toilet and in the tub and shower. Do not use towel bars as grab bars.  Use non-skid mats or decals in the tub or shower.  If you need to sit down in the shower, use a plastic, non-slip  stool.  Keep the floor dry. Clean up any water that spills on the floor as soon as it happens.  Remove soap buildup in the tub or shower regularly.  Attach bath mats securely with double-sided non-slip rug tape.  Do not have throw rugs and other things on the floor that can make you trip. What can I do in the bedroom?  Use night lights.  Make sure that you have a light by your bed that is easy to reach.  Do not use any sheets or blankets that are too big for your bed. They should not hang down onto the floor.  Have a firm chair that has side arms. You can use this for support while you get dressed.  Do not have throw rugs and other things on the floor that can make you trip. What can I do in the kitchen?  Clean up any spills right away.  Avoid walking on wet floors.  Keep items that you use a lot in easy-to-reach places.  If you need to reach something above you, use a strong step stool that has a grab bar.  Keep electrical cords out of the way.  Do not use floor polish or wax that makes  floors slippery. If you must use wax, use non-skid floor wax.  Do not have throw rugs and other things on the floor that can make you trip. What can I do with my stairs?  Do not leave any items on the stairs.  Make sure that there are handrails on both sides of the stairs and use them. Fix handrails that are broken or loose. Make sure that handrails are as long as the stairways.  Check any carpeting to make sure that it is firmly attached to the stairs. Fix any carpet that is loose or worn.  Avoid having throw rugs at the top or bottom of the stairs. If you do have throw rugs, attach them to the floor with carpet tape.  Make sure that you have a light switch at the top of the stairs and the bottom of the stairs. If you do not have them, ask someone to add them for you. What else can I do to help prevent falls?  Wear shoes that:  Do not have high heels.  Have rubber bottoms.  Are  comfortable and fit you well.  Are closed at the toe. Do not wear sandals.  If you use a stepladder:  Make sure that it is fully opened. Do not climb a closed stepladder.  Make sure that both sides of the stepladder are locked into place.  Ask someone to hold it for you, if possible.  Clearly mark and make sure that you can see:  Any grab bars or handrails.  First and last steps.  Where the edge of each step is.  Use tools that help you move around (mobility aids) if they are needed. These include:  Canes.  Walkers.  Scooters.  Crutches.  Turn on the lights when you go into a dark area. Replace any light bulbs as soon as they burn out.  Set up your furniture so you have a clear path. Avoid moving your furniture around.  If any of your floors are uneven, fix them.  If there are any pets around you, be aware of where they are.  Review your medicines with your doctor. Some medicines can make you feel dizzy. This can increase your chance of falling. Ask your doctor what other things that you can do to help prevent falls. This information is not intended to replace advice given to you by your health care provider. Make sure you discuss any questions you have with your health care provider. Document Released: 09/16/2009 Document Revised: 04/27/2016 Document Reviewed: 12/25/2014 Elsevier Interactive Patient Education  2017 [employer].

## 2020-10-19 ENCOUNTER — Ambulatory Visit (INDEPENDENT_AMBULATORY_CARE_PROVIDER_SITE_OTHER): Payer: Medicare HMO | Admitting: Internal Medicine

## 2020-10-19 ENCOUNTER — Encounter: Payer: Self-pay | Admitting: Internal Medicine

## 2020-10-19 ENCOUNTER — Other Ambulatory Visit: Payer: Self-pay

## 2020-10-19 VITALS — BP 134/72 | HR 71 | Ht 75.0 in | Wt 263.8 lb

## 2020-10-19 DIAGNOSIS — I10 Essential (primary) hypertension: Secondary | ICD-10-CM | POA: Diagnosis not present

## 2020-10-19 DIAGNOSIS — E119 Type 2 diabetes mellitus without complications: Secondary | ICD-10-CM | POA: Diagnosis not present

## 2020-10-19 DIAGNOSIS — I2510 Atherosclerotic heart disease of native coronary artery without angina pectoris: Secondary | ICD-10-CM

## 2020-10-19 DIAGNOSIS — E785 Hyperlipidemia, unspecified: Secondary | ICD-10-CM | POA: Diagnosis not present

## 2020-10-19 DIAGNOSIS — I495 Sick sinus syndrome: Secondary | ICD-10-CM

## 2020-10-19 MED ORDER — CLOPIDOGREL BISULFATE 75 MG PO TABS: 75.0000 mg | ORAL_TABLET | Freq: Every day | ORAL | 3 refills | Status: DC

## 2020-10-19 NOTE — Patient Instructions (Addendum)
Medication Instructions:  1) Stop Brilinta   2) Start Clopidogrel (Plavix).  For your first dose take 4 tablets (300 mg) by mouth once, then decrease to 1 tablet (75 mg) by mouth daily   *If you need a refill on your cardiac medications before your next appointment, please call your pharmacy*   Lab Work: None ordered   If you have labs (blood work) drawn today and your tests are completely normal, you will receive your results only by:  MyChart Message (if you have MyChart) OR  A paper copy in the mail If you have any lab test that is abnormal or we need to change your treatment, we will call you to review the results.   Testing/Procedures: None ordered    Follow-Up: At [facility], you and your health needs are our priority.  As part of our continuing mission to provide you with exceptional heart care, we have created designated Provider Care Teams.  These Care Teams include your primary Cardiologist (physician) and Advanced Practice Providers (APPs -  Physician Assistants and Nurse Practitioners) who all work together to provide you with the care you need, when you need it.  We recommend signing up for the patient portal called "MyChart".  Sign up information is provided on this After Visit Summary.  MyChart is used to connect with patients for Virtual Visits (Telemedicine).  Patients are able to view lab/test results, encounter notes, upcoming appointments, etc.  Non-urgent messages can be sent to your provider as well.   To learn more about what you can do with MyChart, go to [email or web address].    Your next appointment:   4-5 month(s)  The format for your next appointment:   In Person  Provider:   You may see Christell Constant, MD or one of the following Advanced Practice Providers on your designated Care Team:    Ronie Spies, PA-C  Jacolyn Reedy, PA-C    Other Instructions None

## 2020-10-19 NOTE — Progress Notes (Signed)
Cardiology Office Note:    Date:  10/19/2020   ID:  Paul Paul 01-01-1956, MRN [id number]  PCP:  Shelva Majestic, MD  [facility] Cardiologist:  Christell Constant, MD  [facility] Electrophysiologist:  Ladona Ridgel  Referring MD: Shelva Majestic, MD   CC: follow up chest pain and NSTEMI Follow up after PCI  History of Present Illness:    Paul Paul is a 64 y.o. male with a hx of Morbid Obesity, Diabetes with HTN, HLD, CAD s/p PCI mLAD 07/2020 on ASA and Brilinta, history of cerebral aneurysm s/p stenting and coiling.  Prior SSS s/p PPM (st. Jude) seen in follow up.  Seen 8/18//21 for NSTEMI and had mLAD PCI.  Patient feels fine.  He has been working out all the time and his blood sugar has decreased.  Deferred cardiac rehab and went back to the gym.  Had chest tightness that did not stop during 07/2020.  Has not had any of this chest tightness since.  No PND, orthopnea.  No bleeding issues. Notes that he has some muscle soreness after working out.    Past Medical History:  Diagnosis Date  . Anxiety   . Arthritis   . Bell palsy    > 30 years. Left side of face droops a little  . Bradycardia   . Cerebral aneurysm   . Cerebral aneurysm without rupture 06/2011   Middle of Cerebral Artery  . Complication of anesthesia 2012   after surgery for cliping of cerebral anerysum- In the room, patient was shaking, and patient would awaken and then "go back out" shaking continued after he "went back out:  . Constipation   . Diabetes mellitus    now under control  . Dysrhythmia    BRADYCARDIA REQUIRING PACEMAKER .FOLLOWED BY GREGG TAYLOR  . HYPERTENSION 12/10/2007  . Pacemaker   . Shoulder pain    L SHOULDER. RECENT FALL  . Sleep apnea    CPAP.NOT WEARING NOW.NEEDS REPLACEMENT PIECE    Past Surgical History:  Procedure Laterality Date  . CARDIAC CATHETERIZATION  07/06/11   normal  . CEREBRAL ANEURYSM REPAIR  06/2011   "stenting and coiling" per patient  . CORONARY STENT  INTERVENTION N/A 07/21/2020   Procedure: CORONARY STENT INTERVENTION;  Surgeon: Iran Ouch, MD;  Location: [facility] INVASIVE CV LAB;  Service: Cardiovascular;  Laterality: N/A;  . FRACTURE SURGERY Left    foot  . INSERT / REPLACE / REMOVE PACEMAKER  2012  . KNEE SURGERY Left    left; arthoscopic  . LEFT HEART CATH AND CORONARY ANGIOGRAPHY N/A 07/21/2020   Procedure: LEFT HEART CATH AND CORONARY ANGIOGRAPHY;  Surgeon: Iran Ouch, MD;  Location: [facility] INVASIVE CV LAB;  Service: Cardiovascular;  Laterality: N/A;  . rotator cuff surgery Left 02/2012  . septal deviation    . SHOULDER ARTHROSCOPY WITH SUBACROMIAL DECOMPRESSION, ROTATOR CUFF REPAIR AND BICEP TENDON REPAIR Right 08/24/2015   Procedure: RIGHT SHOULDER DIAGNOSTIC OPERATIVE ARTHROSCOPY WITH DEBRIDEMENT, SUBACROMIAL DECOMPRESSION, MIMI-OPEN ROTATOR CUFF TEAR REPAIR AND POSSIBLE  BICEPS TENODESIS.  ;  Surgeon: Cammy Copa, MD;  Location: [facility] OR;  Service: Orthopedics;  Laterality: Right;  RIGHT SHOULDER DIAGNOSTIC OPERATIVE ARTHROSCOPY, DEBRIDEMENT, SUBACROMIAL DECOMPRESSION, MINI-OPEN ROTATOR CUFF TEAR REPAIR, POSSIB    Current Medications: Current Meds  Medication Sig  . aspirin EC 81 MG tablet Take 81 mg by mouth daily.  Marland Kitchen atorvastatin (LIPITOR) 80 MG tablet Take 1 tablet (80 mg total) by mouth daily.  Marland Kitchen glimepiride (  AMARYL) 4 MG tablet TAKE 2 TABLETS  BY MOUTH DAILY BEFORE BREAKFAST.  . metoprolol tartrate (LOPRESSOR) 25 MG tablet Take 1 tablet (25 mg total) by mouth 2 (two) times daily.  . nitroGLYCERIN (NITROSTAT) 0.4 MG SL tablet Place 1 tablet (0.4 mg total) under the tongue every 5 (five) minutes as needed for chest pain (up to 3 doses. call for help if doesnt resolve after 1 dose.).  Marland Kitchen ticagrelor (BRILINTA) 90 MG TABS tablet Take 1 tablet (90 mg total) by mouth 2 (two) times daily.     Allergies:   Doxycycline, Lisinopril, Losartan potassium-hctz, and Prednisone   Social History   Socioeconomic History  . Marital  status: Married    Spouse name: Not on file  . Number of children: Not on file  . Years of education: Not on file  . Highest education level: Not on file  Occupational History  . Occupation: Disabled  Tobacco Use  . Smoking status: Former Smoker    Packs/day: 1.00    Years: 20.00    Pack years: 20.00    Types: Cigarettes    Quit date: 07/05/2011    Years since quitting: 9.2  . Smokeless tobacco: Never Used  Vaping Use  . Vaping Use: Never used  Substance and Sexual Activity  . Alcohol use: No    Alcohol/week: 0.0 standard drinks  . Drug use: No  . Sexual activity: Not on file  Other Topics Concern  . Not on file  Social History Narrative   Married over 30 years in 2015. 1 step son. 1 grandson.       Disabled from brain aneurysm, pacemaker. Retired from [employer] years.       Hobbies: grandson, exercise at [facility]   Social Determinants of Health   Financial Resource Strain: Low Risk   . Difficulty of Paying Living Expenses: Not hard at all  Food Insecurity: No Food Insecurity  . Worried About Programme researcher, broadcasting/film/video in the Last Year: Never true  . Ran Out of Food in the Last Year: Never true  Transportation Needs: No Transportation Needs  . Lack of Transportation (Medical): No  . Lack of Transportation (Non-Medical): No  Physical Activity: Sufficiently Active  . Days of Exercise per Week: 5 days  . Minutes of Exercise per Session: 90 min  Stress: No Stress Concern Present  . Feeling of Stress : Not at all  Social Connections: Moderately Integrated  . Frequency of Communication with Friends and Family: Once a week  . Frequency of Social Gatherings with Friends and Family: Never  . Attends Religious Services: More than 4 times per year  . Active Member of Clubs or Organizations: Yes  . Attends Banker Meetings: 1 to 4 times per year  . Marital Status: Married    Family History: The patient's family history includes Diabetes in his mother; Heart attack  in his father and mother; Heart disease in his father and mother.  ROS:   Please see the history of present illness.    All other systems reviewed and are negative.  EKGs/Labs/Other Studies Reviewed:    The following studies were reviewed today:  EKG:   EKG 08/02/20 Sinus 60 with TWI  Recent Labs: 07/28/2020: Hemoglobin 16.4; Platelets 193 09/28/2020: ALT 33; BUN 8; Creat 0.99; Potassium 4.0; Sodium 136  Recent Lipid Panel    Component Value Date/Time   CHOL 97 09/28/2020 0927   TRIG 115 09/28/2020 0927   HDL 38 (L) 09/28/2020 2694  CHOLHDL 2.6 09/28/2020 0927   VLDL 28 07/21/2020 0308   LDLCALC 39 09/28/2020 0927   LDLDIRECT 104.0 08/09/2017 1402    Physical Exam:    VS:  BP 134/72   Pulse 71   Ht 6\' 3"  (1.905 m)   Wt 263 lb 12.8 oz (119.7 kg)   SpO2 97%   BMI 32.97 kg/m     Wt Readings from Last 3 Encounters:  10/19/20 263 lb 12.8 oz (119.7 kg)  10/08/20 266 lb 3.2 oz (120.7 kg)  09/28/20 262 lb 3.2 oz (118.9 kg)     GEN: Well nourished, well developed in no acute distress HEENT: Normal NECK: No JVD; No carotid bruits LYMPHATICS: No lymphadenopathy CARDIAC: RRR, no murmurs, rubs, gallops, R radial site looks ok RESPIRATORY:  Clear to auscultation without rales, wheezing or rhonchi  ABDOMEN: Soft, non-tender, non-distended MUSCULOSKELETAL:  No edema; No deformity  SKIN: Warm and dry NEUROLOGIC:  Alert and oriented x 3 PSYCHIATRIC:  Normal affect   ASSESSMENT:    1. Coronary artery disease involving native coronary artery of native heart without angina pectoris   2. Diabetes mellitus with coincident hypertension (HCC)   3. Hyperlipidemia, unspecified hyperlipidemia type   4. Sinus node dysfunction (HCC)    PLAN:    In order of problems listed above:  Coronary Artery Disease; Obstructive with mLAD stents Obesity Diabetes with HTN HLD SSS s/p PPM Cerebral aneurysm s/p coiling - asymptomatic - anatomy: mLAD PCI, D1 60%, dLAD 40%,  - continue ASA  81 mg; Will transition to plavix with plavix load and then plavix maintenance daily - continue statin, goal LDL < 70 - continue BB - continue nitrates  4-5 months follow up unless new symptoms or abnormal test results warranting change in plan  Would be reasonable for Virtual Follow up Would be reasonable for APP Follow up    Shared Decision Making/Informed Consent       Medication Adjustments/Labs and Tests Ordered: Current medicines are reviewed at length with the patient today.  Concerns regarding medicines are outlined above.  No orders of the defined types were placed in this encounter.  No orders of the defined types were placed in this encounter.   There are no Patient Instructions on file for this visit.   Signed, 09/30/20, MD  10/19/2020 10:34 AM    [facility]

## 2020-11-03 ENCOUNTER — Other Ambulatory Visit: Payer: Self-pay | Admitting: Family Medicine

## 2020-11-08 ENCOUNTER — Other Ambulatory Visit: Payer: Medicare HMO

## 2020-11-08 DIAGNOSIS — Z20822 Contact with and (suspected) exposure to covid-19: Secondary | ICD-10-CM | POA: Diagnosis not present

## 2020-11-09 LAB — SARS-COV-2, NAA 2 DAY TAT

## 2020-11-09 LAB — NOVEL CORONAVIRUS, NAA: SARS-CoV-2, NAA: DETECTED — AB

## 2020-11-10 ENCOUNTER — Telehealth: Payer: Self-pay | Admitting: Family

## 2020-11-10 ENCOUNTER — Telehealth: Payer: Self-pay

## 2020-11-10 ENCOUNTER — Other Ambulatory Visit (HOSPITAL_COMMUNITY): Payer: Self-pay | Admitting: Physician Assistant

## 2020-11-10 NOTE — Telephone Encounter (Signed)
See below

## 2020-11-10 NOTE — Telephone Encounter (Signed)
Pt is following up this.

## 2020-11-10 NOTE — Telephone Encounter (Signed)
Called to Discuss with patient about Covid symptoms and the use of the monoclonal antibody infusion for those with mild to moderate Covid symptoms and at a high risk of hospitalization.     Pt appears to qualify for this infusion due to co-morbid conditions and/or a member of an at-risk group in accordance with the FDA Emergency Use Authorization.    Paul Paul tested positive for COVID-18 on 12/6. Qualifying risk factors include Coronary artery disease, fatty liver, sleep apnea, BMI >25, and Type 2 diabetes. Currently experiencing a cough.   Discussed the risks, benefits and potential financial costs with monoclonal antibody therapy and he will be checking with the insurance company to determine coverage prior to scheduling. Hotline information was provided.  Marcos Eke, NP 11/10/2020 9:52 AM

## 2020-11-10 NOTE — Telephone Encounter (Signed)
Please see below.

## 2020-11-10 NOTE — Telephone Encounter (Signed)
Patient just tested positive for covid and would like to know what over the counter cough medicine he can take with all his current medications  Please advise.?

## 2020-11-10 NOTE — Telephone Encounter (Signed)
Can offer visit or virtual visit with one of my colleges.   If he prefers you can also go over the following with him  Patient with testing confirming covid 19 with first day of covid 19 symptoms  Therefore: - recommended patient watch closely for shortness of breath or confusion or worsening symptoms and if those occur he should contact us immediately or seek care in the emergency department -recommended patient consider purchasing pulse oximeter and if levels 94% or below persistently- seek care at the hospital - for quarantine if covid 19 needs to be at least 10 days since first symptom AND at least 24 hours fever free without fever reducing medications AND have improvement in respiratory symptoms  - close contacts that are vaccinated should be tested if have symptoms but do not have to quarantine unless have symptoms unvaccinated patients should be tested if symptoms and quarantine for 14 days - I would recommend outpatient antibody infusion- looks like they have already reached out to him about that- encourage him to follow through with that.  - let me know if he has further questions and does not choose for virtual visit (if any left available today or possibly tomorrow with Dr. Selena Batten?)

## 2020-11-10 NOTE — Progress Notes (Signed)
I connected by phone with Paul Paul on 11/10/2020 at 4:58 PM to discuss the potential use of a new treatment for mild to moderate COVID-19 viral infection in non-hospitalized patients.  This patient is a 64 y.o. male that meets the FDA criteria for Emergency Use Authorization of COVID monoclonal antibody casirivimab/imdevimab, bamlanivimab/eteseviamb, or sotrovimab.  Has a (+) direct SARS-CoV-2 viral test result  Has mild or moderate COVID-19   Is NOT hospitalized due to COVID-19  Is within 10 days of symptom onset  Has at least one of the high risk factor(s) for progression to severe COVID-19 and/or hospitalization as defined in EUA.  Specific high risk criteria : BMI > 25, Diabetes and Cardiovascular disease or hypertension   I have spoken and communicated the following to the patient or parent/caregiver regarding COVID monoclonal antibody treatment:  1. FDA has authorized the emergency use for the treatment of mild to moderate COVID-19 in adults and pediatric patients with positive results of direct SARS-CoV-2 viral testing who are 51 years of age and older weighing at least 40 kg, and who are at high risk for progressing to severe COVID-19 and/or hospitalization.  2. The significant known and potential risks and benefits of COVID monoclonal antibody, and the extent to which such potential risks and benefits are unknown.  3. Information on available alternative treatments and the risks and benefits of those alternatives, including clinical trials.  4. Patients treated with COVID monoclonal antibody should continue to self-isolate and use infection control measures (e.g., wear mask, isolate, social distance, avoid sharing personal items, clean and disinfect "high touch" surfaces, and frequent handwashing) according to CDC guidelines.   5. The patient or parent/caregiver has the option to accept or refuse COVID monoclonal antibody treatment.  After reviewing this information with the  patient, the patient has agreed to receive one of the available covid 19 monoclonal antibodies and will be provided an appropriate fact sheet prior to infusion. Jodell Cipro, PA-C 11/10/2020 4:58 PM

## 2020-11-10 NOTE — Telephone Encounter (Signed)
Patient just received call for covid antibody testing and would like to know if its okay for him to do

## 2020-11-12 ENCOUNTER — Ambulatory Visit (HOSPITAL_COMMUNITY)
Admission: RE | Admit: 2020-11-12 | Discharge: 2020-11-12 | Disposition: A | Discharge status: Home / Self Care | Payer: Medicare Other | Source: Ambulatory Visit | Attending: Pulmonary Disease | Admitting: Pulmonary Disease

## 2020-11-12 DIAGNOSIS — Z23 Encounter for immunization: Secondary | ICD-10-CM | POA: Diagnosis not present

## 2020-11-12 DIAGNOSIS — U071 COVID-19: Secondary | ICD-10-CM | POA: Insufficient documentation

## 2020-11-12 MED ORDER — SODIUM CHLORIDE 0.9 % IV SOLN: INTRAVENOUS | Status: DC | PRN

## 2020-11-12 MED ORDER — METHYLPREDNISOLONE SODIUM SUCC 125 MG IJ SOLR: 125.0000 mg | Freq: Once | INTRAMUSCULAR | Status: DC | PRN

## 2020-11-12 MED ORDER — EPINEPHRINE 0.3 MG/0.3ML IJ SOAJ: 0.3000 mg | Freq: Once | INTRAMUSCULAR | Status: DC | PRN

## 2020-11-12 MED ORDER — FAMOTIDINE IN NACL 20-0.9 MG/50ML-% IV SOLN: 20.0000 mg | Freq: Once | INTRAVENOUS | Status: DC | PRN

## 2020-11-12 MED ORDER — ALBUTEROL SULFATE HFA 108 (90 BASE) MCG/ACT IN AERS
2.0000 | INHALATION_SPRAY | Freq: Once | RESPIRATORY_TRACT | Status: DC | PRN
Start: 2020-11-12 — End: 2020-11-13

## 2020-11-12 MED ORDER — DIPHENHYDRAMINE HCL 50 MG/ML IJ SOLN: 50.0000 mg | Freq: Once | INTRAMUSCULAR | Status: DC | PRN

## 2020-11-12 MED ORDER — SODIUM CHLORIDE 0.9 % IV SOLN
1200.0000 mg | Freq: Once | INTRAVENOUS | Status: AC
  Administered 2020-11-12: 1200 mg via INTRAVENOUS

## 2020-11-12 NOTE — Discharge Instructions (Signed)
10 Things You Can Do to Manage Your COVID-19 Symptoms at Home If you have possible or confirmed COVID-19: 1. Stay home from work and school. And stay away from other public places. If you must go out, avoid using any kind of public transportation, ridesharing, or taxis. 2. Monitor your symptoms carefully. If your symptoms get worse, call your healthcare provider immediately. 3. Get rest and stay hydrated. 4. If you have a medical appointment, call the healthcare provider ahead of time and tell them that you have or may have COVID-19. 5. For medical emergencies, call 911 and notify the dispatch personnel that you have or may have COVID-19. 6. Cover your cough and sneezes with a tissue or use the inside of your elbow. 7. Wash your hands often with soap and water for at least 20 seconds or clean your hands with an alcohol-based hand sanitizer that contains at least 60% alcohol. 8. As much as possible, stay in a specific room and away from other people in your home. Also, you should use a separate bathroom, if available. If you need to be around other people in or outside of the home, wear a mask. 9. Avoid sharing personal items with other people in your household, like dishes, towels, and bedding. 10. Clean all surfaces that are touched often, like counters, tabletops, and doorknobs. Use household cleaning sprays or wipes according to the label instructions. cdc.gov/coronavirus 06/04/2019 This information is not intended to replace advice given to you by your health care provider. Make sure you discuss any questions you have with your health care provider. Document Revised: 11/06/2019 Document Reviewed: 11/06/2019 Elsevier Patient Education  2020 Elsevier Inc. What types of side effects do monoclonal antibody drugs cause?  Common side effects  In general, the more common side effects caused by monoclonal antibody drugs include: . Allergic reactions, such as hives or itching . Flu-like signs and  symptoms, including chills, fatigue, fever, and muscle aches and pains . Nausea, vomiting . Diarrhea . Skin rashes . Low blood pressure   The CDC is recommending patients who receive monoclonal antibody treatments wait at least 90 days before being vaccinated.  Currently, there are no data on the safety and efficacy of mRNA COVID-19 vaccines in persons who received monoclonal antibodies or convalescent plasma as part of COVID-19 treatment. Based on the estimated half-life of such therapies as well as evidence suggesting that reinfection is uncommon in the 90 days after initial infection, vaccination should be deferred for at least 90 days, as a precautionary measure until additional information becomes available, to avoid interference of the antibody treatment with vaccine-induced immune responses. If you have any questions or concerns after the infusion please call the Advanced Practice Provider on call at [phone]. This number is ONLY intended for your use regarding questions or concerns about the infusion post-treatment side-effects.  Please do not provide this number to others for use. For return to work notes please contact your primary care provider.   If someone you know is interested in receiving treatment please have them call the COVID hotline at [phone].   

## 2020-11-12 NOTE — Progress Notes (Signed)
Patient reviewed Fact Sheet for Patients, Parents, and Caregivers for Emergency Use Authorization (EUA) of Regen-Cov for the Treatment of Coronavirus.  Patient also reviewed and is agreeable to the estimated cost of treatment.  Patient is agreeable to proceed.   

## 2020-11-12 NOTE — Progress Notes (Signed)
  Diagnosis: COVID-19  Physician: Dr. Shan Levans   Procedure: Medication fact sheet provided to patient; all questions answered.  Allergies reviewed with patient.  IV placed.  Regen-Cov administered via IV infusion.    Complications: No complications and patient discharged in stable condition.  Discharge: Discharged home   Paul Paul 11/12/2020

## 2020-11-17 ENCOUNTER — Encounter: Payer: Self-pay | Admitting: Physician Assistant

## 2020-11-17 ENCOUNTER — Telehealth: Payer: Medicare Other | Admitting: Physician Assistant

## 2020-11-17 ENCOUNTER — Ambulatory Visit (INDEPENDENT_AMBULATORY_CARE_PROVIDER_SITE_OTHER): Payer: Medicare Other | Admitting: Physician Assistant

## 2020-11-17 VITALS — HR 61

## 2020-11-17 DIAGNOSIS — U071 COVID-19: Secondary | ICD-10-CM

## 2020-11-17 NOTE — Progress Notes (Signed)
Paul Paul is a 64 y.o. male here for a follow up of a pre-existing problem.  I acted as a Neurosurgeon for [employer], PA-C [employer], LPN   History of Present Illness:   Chief Complaint  Patient presents with   Cough    HPI   Cough Pt has Covid was positive on 12/6, had infusion on 12/10. Pt's wife also has Covid. Pt c/o increase in dry non-productive cough, some wheezing. Denies fever, chills and no SOB. He has been taking Mucinex and Robitussin but has not been working the past 2 days. Pt is concerned about pneumonia.  He has had one J&J vaccine dose per our records.  Lab Results  Component Value Date   HGBA1C 8.7 (A) 09/28/2020     Past Medical History:  Diagnosis Date   Anxiety    Arthritis    Bell palsy    > 30 years. Left side of face droops a little   Bradycardia    Cerebral aneurysm    Cerebral aneurysm without rupture 06/2011   Middle of Cerebral Artery   Complication of anesthesia 2012   after surgery for cliping of cerebral anerysum- In the room, patient was shaking, and patient would awaken and then "go back out" shaking continued after he "went back out:   Constipation    Diabetes mellitus    now under control   Dysrhythmia    BRADYCARDIA REQUIRING PACEMAKER .FOLLOWED BY GREGG TAYLOR   HYPERTENSION 12/10/2007   Pacemaker    Shoulder pain    L SHOULDER. RECENT FALL   Sleep apnea    CPAP.NOT WEARING NOW.NEEDS REPLACEMENT PIECE     Social History   Tobacco Use   Smoking status: Former Smoker    Packs/day: 1.00    Years: 20.00    Pack years: 20.00    Types: Cigarettes    Quit date: 07/05/2011    Years since quitting: 9.3   Smokeless tobacco: Never Used  Vaping Use   Vaping Use: Never used  Substance Use Topics   Alcohol use: No    Alcohol/week: 0.0 standard drinks   Drug use: No    Past Surgical History:  Procedure Laterality Date   CARDIAC CATHETERIZATION  07/06/11   normal   CEREBRAL ANEURYSM REPAIR  06/2011    "stenting and coiling" per patient   CORONARY STENT INTERVENTION N/A 07/21/2020   Procedure: CORONARY STENT INTERVENTION;  Surgeon: Iran Ouch, MD;  Location: [facility];  Service: Cardiovascular;  Laterality: N/A;   FRACTURE SURGERY Left    foot   INSERT / REPLACE / REMOVE PACEMAKER  2012   KNEE SURGERY Left    left; arthoscopic   LEFT HEART CATH AND CORONARY ANGIOGRAPHY N/A 07/21/2020   Procedure: LEFT HEART CATH AND CORONARY ANGIOGRAPHY;  Surgeon: Iran Ouch, MD;  Location: [facility];  Service: Cardiovascular;  Laterality: N/A;   rotator cuff surgery Left 02/2012   septal deviation     SHOULDER ARTHROSCOPY WITH SUBACROMIAL DECOMPRESSION, ROTATOR CUFF REPAIR AND BICEP TENDON REPAIR Right 08/24/2015   Procedure: RIGHT SHOULDER DIAGNOSTIC OPERATIVE ARTHROSCOPY WITH DEBRIDEMENT, SUBACROMIAL DECOMPRESSION, MIMI-OPEN ROTATOR CUFF TEAR REPAIR AND POSSIBLE  BICEPS TENODESIS.  ;  Surgeon: Cammy Copa, MD;  Location: [facility];  Service: Orthopedics;  Laterality: Right;  RIGHT SHOULDER DIAGNOSTIC OPERATIVE ARTHROSCOPY, DEBRIDEMENT, SUBACROMIAL DECOMPRESSION, MINI-OPEN ROTATOR CUFF TEAR REPAIR, POSSIB    Family History  Problem Relation Age of Onset   Heart attack Mother  died 68, smoker   Heart disease Mother    Diabetes Mother    Heart attack Father        died 36, smoker   Heart disease Father     Allergies  Allergen Reactions   Doxycycline Rash and Nausea Only   Lisinopril Rash   Losartan Potassium-Hctz Other (See Comments)    Caused dizziness & fatigue   Prednisone Other (See Comments)    Raises blood sugar    Current Medications:   Current Outpatient Medications:    aspirin EC 81 MG tablet, Take 81 mg by mouth daily., Disp: , Rfl:    atorvastatin (LIPITOR) 80 MG tablet, Take 1 tablet (80 mg total) by mouth daily., Disp: 90 tablet, Rfl: 3   clopidogrel (PLAVIX) 75 MG tablet, Take 1 tablet (75 mg total) by mouth daily.  For your first dose take 4 tablets by mouth, then decrease to 1 tablet by mouth daily., Disp: 90 tablet, Rfl: 3   glimepiride (AMARYL) 4 MG tablet, TAKE 2 TABLETS  BY MOUTH DAILY BEFORE BREAKFAST., Disp: 180 tablet, Rfl: 1   metoprolol tartrate (LOPRESSOR) 25 MG tablet, Take 1 tablet (25 mg total) by mouth 2 (two) times daily., Disp: 180 tablet, Rfl: 3   nitroGLYCERIN (NITROSTAT) 0.4 MG SL tablet, Place 1 tablet (0.4 mg total) under the tongue every 5 (five) minutes as needed for chest pain (up to 3 doses. call for help if doesnt resolve after 1 dose.)., Disp: 30 tablet, Rfl: 3   Review of Systems:   ROS  Negative unless otherwise specified per HPI.  Vitals:   Vitals:   11/17/20 1558  Pulse: 61  SpO2: 94%     There is no height or weight on file to calculate BMI.  Physical Exam:   Physical Exam Neurological:     Mental Status: He is alert.  Psychiatric:        Mood and Affect: Mood normal.        Thought Content: Thought content normal.        Judgment: Judgment normal.      Assessment and Plan:   Paul Paul was seen today for cough.  Diagnoses and all orders for this visit:  COVID-19   Patient had vitals taken by my LPN, Lupita Leash. Oxygen level consistently 93-94%. He is in day 9 of his COVID-19 illness. Concern for worsening infection, underlying PNA, PE, or multitude of other potential COVID-19 sequelae.  I discussed with patient over the phone that due to his significant medical history and current symptoms/vitals, that he go to the ER immediately for evaluation.  CMA or LPN served as scribe during this visit. History, Physical, and Plan performed by medical provider. The above documentation has been reviewed and is accurate and complete.  Jarold Motto, PA-C

## 2020-11-18 ENCOUNTER — Emergency Department (HOSPITAL_COMMUNITY)
Admission: EM | Admit: 2020-11-18 | Discharge: 2020-11-18 | Disposition: A | Discharge status: Home / Self Care | Payer: Medicare HMO | Attending: Emergency Medicine | Admitting: Emergency Medicine

## 2020-11-18 ENCOUNTER — Encounter (HOSPITAL_COMMUNITY): Payer: Self-pay

## 2020-11-18 ENCOUNTER — Other Ambulatory Visit: Payer: Self-pay

## 2020-11-18 ENCOUNTER — Emergency Department (HOSPITAL_COMMUNITY): Payer: Medicare HMO

## 2020-11-18 DIAGNOSIS — I2510 Atherosclerotic heart disease of native coronary artery without angina pectoris: Secondary | ICD-10-CM | POA: Insufficient documentation

## 2020-11-18 DIAGNOSIS — R0602 Shortness of breath: Secondary | ICD-10-CM | POA: Diagnosis not present

## 2020-11-18 DIAGNOSIS — I10 Essential (primary) hypertension: Secondary | ICD-10-CM | POA: Diagnosis not present

## 2020-11-18 DIAGNOSIS — Z79899 Other long term (current) drug therapy: Secondary | ICD-10-CM | POA: Diagnosis not present

## 2020-11-18 DIAGNOSIS — E119 Type 2 diabetes mellitus without complications: Secondary | ICD-10-CM | POA: Insufficient documentation

## 2020-11-18 DIAGNOSIS — Z7982 Long term (current) use of aspirin: Secondary | ICD-10-CM | POA: Diagnosis not present

## 2020-11-18 DIAGNOSIS — U071 COVID-19: Secondary | ICD-10-CM | POA: Insufficient documentation

## 2020-11-18 DIAGNOSIS — Z87891 Personal history of nicotine dependence: Secondary | ICD-10-CM | POA: Insufficient documentation

## 2020-11-18 DIAGNOSIS — Z7984 Long term (current) use of oral hypoglycemic drugs: Secondary | ICD-10-CM | POA: Insufficient documentation

## 2020-11-18 MED ORDER — BENZONATATE 100 MG PO CAPS: 100.0000 mg | ORAL_CAPSULE | Freq: Two times a day (BID) | ORAL | 0 refills | Status: DC | PRN

## 2020-11-18 NOTE — ED Provider Notes (Signed)
[facility]-EMERGENCY DEPT Provider Note   CSN: [id number] Arrival date & time: 11/18/20  1660     History Chief Complaint  Patient presents with  . Shortness of Breath    Covid+    Paul Paul is a 64 y.o. male.  The history is provided by the patient.  Cough Cough characteristics:  Non-productive Sputum characteristics:  Nondescript Severity:  Moderate Onset quality:  Gradual Timing:  Intermittent Progression:  Waxing and waning Chronicity:  New Context: upper respiratory infection (COVID positive 9 days ago., Vaccinated but no booster. )   Relieved by:  Nothing Worsened by:  Activity Associated symptoms: shortness of breath   Associated symptoms: no chest pain, no chills, no diaphoresis, no ear fullness, no ear pain, no eye discharge, no fever, no headaches, no myalgias, no rash, no rhinorrhea, no sinus congestion, no sore throat, no weight loss and no wheezing        Past Medical History:  Diagnosis Date  . Anxiety   . Arthritis   . Bell palsy    > 30 years. Left side of face droops a little  . Bradycardia   . Cerebral aneurysm   . Cerebral aneurysm without rupture 06/2011   Middle of Cerebral Artery  . Complication of anesthesia 2012   after surgery for cliping of cerebral anerysum- In the room, patient was shaking, and patient would awaken and then "go back out" shaking continued after he "went back out:  . Constipation   . Diabetes mellitus    now under control  . Dysrhythmia    BRADYCARDIA REQUIRING PACEMAKER .FOLLOWED BY GREGG TAYLOR  . HYPERTENSION 12/10/2007  . Pacemaker   . Shoulder pain    L SHOULDER. RECENT FALL  . Sleep apnea    CPAP.NOT WEARING NOW.NEEDS REPLACEMENT PIECE    Patient Active Problem List   Diagnosis Date Noted  . Hyperlipidemia 10/19/2020  . Aortic atherosclerosis (HCC) 07/28/2020  . CAD (coronary artery disease) status post drug-eluting stent mid LAD August 2021 07/28/2020  . NSTEMI (non-ST elevated  myocardial infarction) (HCC) 07/20/2020  . Sinus node dysfunction (HCC) 08/05/2019  . Fatty liver 06/18/2018  . Hepatotoxicity due to statin drug 10/19/2017  . Elevated LFTs 08/09/2017  . Chronic pruritic rash in adult 08/09/2017  . GAD (generalized anxiety disorder) 10/11/2016  . OSA on CPAP 09/24/2014  . Hyperlipidemia associated with type 2 diabetes mellitus (HCC) 09/24/2014  . Obesity 01/01/2014  . Other specified cardiac dysrhythmias(427.89) 07/09/2012  . Cerebral aneurysm without rupture 01/01/2012  . Pacemaker 10/07/2011  . Poorly controlled type 2 diabetes mellitus (HCC) 11/09/2008  . Diabetes mellitus with coincident hypertension (HCC) 12/10/2007    Past Surgical History:  Procedure Laterality Date  . CARDIAC CATHETERIZATION  07/06/11   normal  . CEREBRAL ANEURYSM REPAIR  06/2011   "stenting and coiling" per patient  . CORONARY STENT INTERVENTION N/A 07/21/2020   Procedure: CORONARY STENT INTERVENTION;  Surgeon: Iran Ouch, MD;  Location: [facility];  Service: Cardiovascular;  Laterality: N/A;  . FRACTURE SURGERY Left    foot  . INSERT / REPLACE / REMOVE PACEMAKER  2012  . KNEE SURGERY Left    left; arthoscopic  . LEFT HEART CATH AND CORONARY ANGIOGRAPHY N/A 07/21/2020   Procedure: LEFT HEART CATH AND CORONARY ANGIOGRAPHY;  Surgeon: Iran Ouch, MD;  Location: [facility];  Service: Cardiovascular;  Laterality: N/A;  . rotator cuff surgery Left 02/2012  . septal deviation    .  SHOULDER ARTHROSCOPY WITH SUBACROMIAL DECOMPRESSION, ROTATOR CUFF REPAIR AND BICEP TENDON REPAIR Right 08/24/2015   Procedure: RIGHT SHOULDER DIAGNOSTIC OPERATIVE ARTHROSCOPY WITH DEBRIDEMENT, SUBACROMIAL DECOMPRESSION, MIMI-OPEN ROTATOR CUFF TEAR REPAIR AND POSSIBLE  BICEPS TENODESIS.  ;  Surgeon: Cammy CopaScott Gregory Dean, MD;  Location: [facility];  Service: Orthopedics;  Laterality: Right;  RIGHT SHOULDER DIAGNOSTIC OPERATIVE ARTHROSCOPY, DEBRIDEMENT, SUBACROMIAL DECOMPRESSION, MINI-OPEN  ROTATOR CUFF TEAR REPAIR, POSSIB       Family History  Problem Relation Age of Onset  . Heart attack Mother        died 10654, smoker  . Heart disease Mother   . Diabetes Mother   . Heart attack Father        died 7054, smoker  . Heart disease Father     Social History   Tobacco Use  . Smoking status: Former Smoker    Packs/day: 1.00    Years: 20.00    Pack years: 20.00    Types: Cigarettes    Quit date: 07/05/2011    Years since quitting: 9.3  . Smokeless tobacco: Never Used  Vaping Use  . Vaping Use: Never used  Substance Use Topics  . Alcohol use: No    Alcohol/week: 0.0 standard drinks  . Drug use: No    Home Medications Prior to Admission medications   Medication Sig Start Date End Date Taking? Authorizing Provider  aspirin EC 81 MG tablet Take 81 mg by mouth daily.    [provider]  atorvastatin (LIPITOR) 80 MG tablet Take 1 tablet (80 mg total) by mouth daily. 08/17/20   Christell Constanthandrasekhar, Mahesh A, MD  clopidogrel (PLAVIX) 75 MG tablet Take 1 tablet (75 mg total) by mouth daily. For your first dose take 4 tablets by mouth, then decrease to 1 tablet by mouth daily. 10/19/20   Chandrasekhar, Mahesh A, MD  glimepiride (AMARYL) 4 MG tablet TAKE 2 TABLETS  BY MOUTH DAILY BEFORE BREAKFAST. 11/04/20   Shelva MajesticHunter, Stephen O, MD  metoprolol tartrate (LOPRESSOR) 25 MG tablet Take 1 tablet (25 mg total) by mouth 2 (two) times daily. 08/17/20   Chandrasekhar, Rondel JumboMahesh A, MD  nitroGLYCERIN (NITROSTAT) 0.4 MG SL tablet Place 1 tablet (0.4 mg total) under the tongue every 5 (five) minutes as needed for chest pain (up to 3 doses. call for help if doesnt resolve after 1 dose.). 07/28/20   Shelva MajesticHunter, Stephen O, MD    Allergies    Doxycycline, Lisinopril, Losartan potassium-hctz, and Prednisone  Review of Systems   Review of Systems  Constitutional: Negative for chills, diaphoresis, fever and weight loss.  HENT: Negative for ear pain, rhinorrhea and sore throat.   Eyes: Negative for  pain, discharge and visual disturbance.  Respiratory: Positive for cough and shortness of breath. Negative for wheezing.   Cardiovascular: Negative for chest pain and palpitations.  Gastrointestinal: Negative for abdominal pain and vomiting.  Genitourinary: Negative for dysuria and hematuria.  Musculoskeletal: Negative for arthralgias, back pain and myalgias.  Skin: Negative for color change and rash.  Neurological: Negative for seizures, syncope and headaches.  All other systems reviewed and are negative.   Physical Exam Updated Vital Signs BP (!) 141/73   Pulse 64   Temp 98.4 F (36.9 C)   Resp (!) 24   Ht 6\' 3"  (1.905 m)   Wt 115.7 kg   SpO2 96%   BMI 31.87 kg/m   Physical Exam Vitals and nursing note reviewed.  Constitutional:      General: He is not in acute distress.  Appearance: He is well-developed and well-nourished. He is not ill-appearing.  HENT:     Head: Normocephalic and atraumatic.  Eyes:     Conjunctiva/sclera: Conjunctivae normal.     Pupils: Pupils are equal, round, and reactive to light.  Cardiovascular:     Rate and Rhythm: Normal rate and regular rhythm.     Pulses: Normal pulses.     Heart sounds: Normal heart sounds. No murmur heard.   Pulmonary:     Effort: Pulmonary effort is normal. No respiratory distress.     Breath sounds: Normal breath sounds. No wheezing.  Abdominal:     Palpations: Abdomen is soft.     Tenderness: There is no abdominal tenderness.  Musculoskeletal:        General: No edema. Normal range of motion.     Cervical back: Normal range of motion and neck supple.     Right lower leg: No edema.     Left lower leg: No edema.  Skin:    General: Skin is warm and dry.     Capillary Refill: Capillary refill takes less than 2 seconds.  Neurological:     General: No focal deficit present.     Mental Status: He is alert.  Psychiatric:        Mood and Affect: Mood and affect normal.     ED Results / Procedures / Treatments    Labs (all labs ordered are listed, but only abnormal results are displayed) Labs Reviewed - No data to display  EKG  Sinus rhytmn, normal intervals.  Radiology DG Chest Portable 1 View  Result Date: 11/18/2020 CLINICAL DATA:  History of COVID-19 for several days with shortness of breath, initial encounter EXAM: PORTABLE CHEST 1 VIEW COMPARISON:  07/20/2020 FINDINGS: Cardiac shadow is stable. Pacing device is again seen. The lungs are clear bilaterally. No focal infiltrate or effusion is noted. No bony abnormality is seen. IMPRESSION: No active disease. Electronically Signed   By: Alcide Clever M.D.   On: 11/18/2020 10:32    Procedures Procedures (including critical care time)  Medications Ordered in ED Medications - No data to display  ED Course  I have reviewed the triage vital signs and the nursing notes.  Pertinent labs & imaging results that were available during my care of the patient were reviewed by me and considered in my medical decision making (see chart for details).    MDM Rules/Calculators/A&P                          CAPERS HAGMANN is a 64 year old male with history of hypertension, diabetes who presents to the ED with cough, shortness of breath.  Covid +9 days ago.  Is vaccinated.  Did have antibody infusion recently.  Was sent for chest x-ray by primary care doctor.  Room air oxygenation in the upper 90s.  When he ambulates oxygenation stays in the mid 90s.  He is not overly symptomatic when he walks.  No significant tachycardia either.  EKG shows sinus rhythm.  No fever.  Overall has mild symptoms.  Chest x-ray unremarkable.  Recommend continue supportive care at home and understands return precautions.  This chart was dictated using voice recognition software.  Despite best efforts to proofread,  errors can occur which can change the documentation meaning.    Final Clinical Impression(s) / ED Diagnoses Final diagnoses:  COVID-19    Rx / DC Orders ED  Discharge Orders    None  Virgina Norfolk, DO 11/18/20 1044

## 2020-11-18 NOTE — ED Triage Notes (Signed)
Pt arrived to ED via walk in c/o covid s/sx x10 days. Vaccinated x2 earlier this year, has had MAB infusion already. SHOB with exertion. Mild-moderate cough. SpO2 93% RA while walking, 95% at rest.

## 2020-11-18 NOTE — Discharge Instructions (Addendum)
Continue supportive care at home.  Chest x-ray is clear and there are no acute abnormalities.

## 2020-11-22 ENCOUNTER — Telehealth: Payer: Self-pay

## 2020-11-22 NOTE — Telephone Encounter (Signed)
Antibiotic will not help covid. Did tessalon help his cough at all- that was sent In previously- I can send more of that if helpful. 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

## 2020-11-22 NOTE — Telephone Encounter (Signed)
Would use mucinex to loosen congestion to allow him to get it up.   Prednisone may not be the best choice right now but there is some data for inhaled steroids- like flovent- would he like to try that? Ill send in if so if he doesn't mind inhaler

## 2020-11-22 NOTE — Telephone Encounter (Signed)
Virtual needed since was Just seen last week?

## 2020-11-22 NOTE — Telephone Encounter (Signed)
Pt went to ED Friday morning and they told him he was "okay" and not candidate for anything the hospital had to offer. They gave pt no medication. He is requesting something for his cough and possibly an antibiotic.   Pt was seen by Lelon Mast last week

## 2020-11-22 NOTE — Telephone Encounter (Signed)
Called and spoke with pt and pt states that he does not wish for anything stronger for the cough. He wants to know what to do for the congestion and he would be willing to take steroids even though he knows it may run his blood sugar up. He states he only coughs when he tries to take a deep breath.

## 2020-11-23 NOTE — Telephone Encounter (Signed)
He is requesting prednisone and I am suggesting inhaled prednisone which can help with cough/congestion if he is willing I will send this in.  Unfortunately this may take several weeks to heal up completely

## 2020-11-23 NOTE — Telephone Encounter (Signed)
Patient states that he's used 2 full boxes of Mucinex that hasn't helped him, He also doesn't think the inhaler will help either. He was still coughing really bad during our phone call, and sounded very congested still. Is there anything else we could for him ?

## 2020-11-24 ENCOUNTER — Other Ambulatory Visit: Payer: Self-pay | Admitting: Family Medicine

## 2020-11-24 ENCOUNTER — Telehealth: Payer: Self-pay

## 2020-11-24 MED ORDER — BUDESONIDE 180 MCG/ACT IN AEPB: 2.0000 | INHALATION_SPRAY | Freq: Two times a day (BID) | RESPIRATORY_TRACT | 0 refills | Status: DC

## 2020-11-24 NOTE — Telephone Encounter (Signed)
Patient called pharmacy and pharmacy stated his insurance will not cover budesonide (PULMICORT) 180 MCG/ACT inhaler we would either need prior authorization or to send in something else for patient.  Please advise thank you !!

## 2020-11-24 NOTE — Telephone Encounter (Signed)
Sorry I meant it is LIKE inhaled prednisone- it is an inhaled steroid. The other thing about prednisone I wouldn't advise is previously it really elevated sugars

## 2020-11-24 NOTE — Telephone Encounter (Signed)
Yes, pt would like for you to send this in for him to [facility].

## 2020-11-24 NOTE — Addendum Note (Signed)
Addended by: Shelva Majestic on: 11/24/2020 04:14 PM   Modules accepted: Orders

## 2020-11-25 ENCOUNTER — Telehealth: Payer: Self-pay

## 2020-11-25 MED ORDER — FLOVENT DISKUS 250 MCG/BLIST IN AEPB: 2.0000 | INHALATION_SPRAY | Freq: Two times a day (BID) | RESPIRATORY_TRACT | 0 refills | Status: DC

## 2020-11-25 MED FILL — PULMICORT 180 MCG FLEXHALER: 180 | 30 days supply | Qty: 1 | Fill #0

## 2020-11-25 NOTE — Telephone Encounter (Signed)
See below

## 2020-11-25 NOTE — Telephone Encounter (Signed)
Pt following up on this. Pt cannot afford the $500 prescription

## 2020-11-25 NOTE — Telephone Encounter (Signed)
See if flovent is more cost effective please- I sent this in

## 2020-11-25 NOTE — Telephone Encounter (Signed)
[employer] called stating the pts pulmicort 180 mcg prescription is too expensive. Representative states [employer] is recommending other prescriptions at lower prices. Representative was calling to get a verbal to chance prescription. Please advise. [phone].

## 2020-11-30 NOTE — Telephone Encounter (Signed)
Called and spoke with pharmacy and they have what the need already.

## 2020-12-13 ENCOUNTER — Other Ambulatory Visit: Payer: Medicare HMO

## 2020-12-13 DIAGNOSIS — Z20822 Contact with and (suspected) exposure to covid-19: Secondary | ICD-10-CM | POA: Diagnosis not present

## 2020-12-15 ENCOUNTER — Telehealth: Payer: Self-pay | Admitting: Family Medicine

## 2020-12-15 LAB — NOVEL CORONAVIRUS, NAA: SARS-CoV-2, NAA: NOT DETECTED

## 2020-12-15 LAB — SARS-COV-2, NAA 2 DAY TAT

## 2020-12-15 NOTE — Chronic Care Management (AMB) (Signed)
  Chronic Care Management   Note  12/15/2020 Name: TYREES CHOPIN MRN: [id number] DOB: [birth year]  ALPHA MYSLIWIEC is a 65 y.o. year old male who is a primary care patient of Shelva Majestic, MD. I reached out to [name] by phone today in response to a referral sent by Mr. Karem Tomaso Hailes's PCP, Shelva Majestic, MD.   Mr. Treichler was given information about Chronic Care Management services today including:  1. CCM service includes personalized support from designated clinical staff supervised by his physician, including individualized plan of care and coordination with other care providers 2. 24/7 contact phone numbers for assistance for urgent and routine care needs. 3. Service will only be billed when office clinical staff spend 20 minutes or more in a month to coordinate care. 4. Only one practitioner may furnish and bill the service in a calendar month. 5. The patient may stop CCM services at any time (effective at the end of the month) by phone call to the office staff.   Patient wishes to consider information provided and/or speak with a member of the care team before deciding about enrollment in care management services.   Follow up plan:   Carmell [place] [facility] Scheduler

## 2020-12-16 ENCOUNTER — Other Ambulatory Visit: Payer: Self-pay

## 2020-12-16 NOTE — Patient Instructions (Incomplete)
Health Maintenance Due  Topic Date Due  . OPHTHALMOLOGY EXAM Hasn't had on recently. Please call to schedule this 12/28/2017  . PNA vac Low Risk Adult (2 of 2 - PPSV23) Done today in office. 04/06/2020  . COVID-19 Vaccine (2 - Booster for Paul Paul series) please get this perhaps a month from injection today for pneumonia 04/11/2020   Please stop by lab before you go- just urine test If you have mychart- we will send your results within 3 business days of [place] receiving them.  If you do not have mychart- we will call you about results within 5 business days of [place] receiving them.  *please also note that you will see labs on mychart as soon as they post. I will later go in and write notes on them- will say "notes from Dr. Durene Cal"   Recommended follow up: Return in about 3 months (around 03/17/2021) for follow up- or sooner if needed. schedule physical if available at that time

## 2020-12-16 NOTE — Progress Notes (Signed)
Phone [phone] In person visit   Subjective:   Paul Paul is a 65 y.o. year old very pleasant male patient who presents for/with See problem oriented charting Chief Complaint  Patient presents with  . Follow-up    Covid follow up , Pneumonia shot    This visit occurred during the SARS-CoV-2 public health emergency.  Safety protocols were in place, including screening questions prior to the visit, additional usage of staff PPE, and extensive cleaning of exam room while observing appropriate contact time as indicated for disinfecting solutions.   Past Medical History-  Patient Active Problem List   Diagnosis Date Noted  . CAD (coronary artery disease) status post drug-eluting stent mid LAD August 2021 07/28/2020    Priority: High  . Sinus node dysfunction ([place]) 08/05/2019    Priority: High  . Cerebral aneurysm without rupture 01/01/2012    Priority: High  . Pacemaker 10/07/2011    Priority: High  . Poorly controlled type 2 diabetes mellitus ([place]) 11/09/2008    Priority: High  . Aortic atherosclerosis ([place]) 07/28/2020    Priority: Medium  . Fatty liver 06/18/2018    Priority: Medium  . 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 Elevated LFTs 08/09/2017    Priority: Medium  . GAD (generalized anxiety disorder) 10/11/2016    Priority: Medium  . OSA on CPAP 09/24/2014    Priority: Medium  . Hyperlipidemia associated with type 2 diabetes mellitus ([place]) 09/24/2014    Priority: Medium  . Other specified cardiac dysrhythmias(427.89) 07/09/2012    Priority: Medium  . Diabetes mellitus with coincident hypertension ([place]) 12/10/2007    Priority: Medium  . Obesity 01/01/2014    Priority: Low  . Hyperlipidemia 10/19/2020  . NSTEMI (non-ST elevated myocardial infarction) ([place]) 07/20/2020  . Chronic pruritic rash in adult 08/09/2017    Medications- reviewed and updated Current Outpatient Medications  Medication Sig Dispense Refill  . aspirin EC 81  MG tablet Take 81 mg by mouth daily.    Marland Kitchen atorvastatin (LIPITOR) 80 MG tablet Take 1 tablet (80 mg total) by mouth daily. 90 tablet 3  . benzonatate (TESSALON) 100 MG capsule Take 1 capsule (100 mg total) by mouth 2 (two) times daily as needed for up to 15 doses for cough. 15 capsule 0  . clopidogrel (PLAVIX) 75 MG tablet Take 1 tablet (75 mg total) by mouth daily. For your first dose take 4 tablets by mouth, then decrease to 1 tablet by mouth daily. 90 tablet 3  . glimepiride (AMARYL) 4 MG tablet TAKE 2 TABLETS  BY MOUTH DAILY BEFORE BREAKFAST. 180 tablet 1  . metoprolol tartrate (LOPRESSOR) 25 MG tablet Take 1 tablet (25 mg total) by mouth 2 (two) times daily. 180 tablet 3  . nitroGLYCERIN (NITROSTAT) 0.4 MG SL tablet Place 1 tablet (0.4 mg total) under the tongue every 5 (five) minutes as needed for chest pain (up to 3 doses. call for help if doesnt resolve after 1 dose.). 30 tablet 3   No current facility-administered medications for this visit.     Objective:  BP 126/70   Pulse 60   Temp 98 F (36.7 C) (Temporal)   Ht '6\' 3"'  (1.905 m)   Wt 256 lb 9.6 oz (116.4 kg)   SpO2 97%   BMI 32.07 kg/m  Gen: NAD, resting comfortably CV: RRR no murmurs rubs or gallops Lungs: CTAB no crackles, wheeze, rhonchi Ext: no edema Skin: warm, dry  Diabetic Foot Exam -  Simple   Simple Foot Form Diabetic Foot exam was performed with the following findings: Yes 12/17/2020  4:09 PM  Visual Inspection No deformities, no ulcerations, no other skin breakdown bilaterally: Yes Sensation Testing Intact to touch and monofilament testing bilaterally: Yes Pulse Check Posterior Tibialis and Dorsalis pulse intact bilaterally: Yes Comments        Assessment and Plan  F/U on Covid S: Patient diagnosed with COVID in December 2021-saw Inda Coke 11/17/2020 and was on day 9 of illness- oxygen levels were in the 93 to 94% range and there was concern for potential pneumonia, pulmonary embolism, worsening  COVID-19 and directed to emergency room.  Patient had already been treated with monoclonal antibody infusion-work-up in emergency room was reassuring including chest x-ray and recommended at home care  He had J+J vaccine 02/15/20. Energy levels were super low and has lost muscle mass as a result of illness. Feels like breathing fine at this point. Oxygen levels up to 97% from lower 90s. Still gets some occasional phlegm and occasional cough. Energy levels improved and back to gym- actually working at [employer] now. Working out 4 days a week.  A/P: Patient seems to be doing reasonably well post COVID-he wanted to visit a having listen to his lungs and an overall checkup- seems to have recovered quite well- we will return to regular scheduled follow-up primarily for diabetes  %History of cerebral aneurysm without rupture. Follows with [facility] radiology - in 2012 had 2 stents and coils.  -encouraged him to call to schedule follow up- has been a few years due to COVID-19 pandemic. This could be further complicated by recent addition of Plavix and aspirin but would at least like to see Dr. Arlean Hopping plan -encouraged him to complete cologuard  #Diabetes mellitus-poor control. Metformin intolerant- GI. Declines relion 01/20/2019 and 10/02/2019.  Cost a major concern so other options are limited S: compliant with 19m glimepiride daily.  Large swings in sugars in past as far as control with improved diet/exercise.  CBGs- was not eating well with covid- could barely make food- sugars around 200 but starting to come back down after going to the gym  Lab Results  Component Value Date   HGBA1C 8.7 (A) 09/28/2020   HGBA1C 10.4 (H) 07/20/2020   HGBA1C 9.4 (H) 10/02/2019  A/P: Poor control last visit-patient has made significant swings in the past with lifestyle changes. He is on Nipride 8 mg daily. Metformin intolerant due to GI side effects though has not tried extended release-declines again today. Declines ReliOn.  Medications are very expensive on his plan and declines Jardiance and Ozempic. He would like to buckle down on diet and exercise and recheck in 3 months   #Hypertension S: Controlled on metoprolol 25 mg BID BP Readings from Last 3 Encounters:  12/17/20 126/70  11/18/20 126/86  11/12/20 (!) 142/82  A/P:  Stable. Continue current medications.    #Hyperlipidemia/elevated LFTs/statin intolerant and patient with coronary artery disease/aortic atherosclerosis S: Patient has been off of statin due to elevated LFTs.  History of hepatotoxicity due to statins. Last LFTs still high mildly  Patient is on Plavix (Brilinta intolerant) and aspirin  We tried lovastatin 260monce a week but had stomach upset on this.  Later started on atorvastatin 80 mg through cardiology Lab Results  Component Value Date   CHOL 97 09/28/2020   HDL 38 (L) 09/28/2020   LDLCALC 39 09/28/2020   LDLDIRECT 104.0 08/09/2017   TRIG 115 09/28/2020   CHOLHDL 2.6 09/28/2020  Hepatic Function Latest Ref Rng & Units 09/28/2020 07/28/2020 10/02/2019  Total Protein 6.1 - 8.1 g/dL 7.3 7.4 7.6  Albumin 3.5 - 5.2 g/dL - - 3.8  AST 10 - 35 U/L 32 56(H) 47(H)  ALT 9 - 46 U/L 33 50(H) 43  Alk Phosphatase 39 - 117 U/L - - 62  Total Bilirubin 0.2 - 1.2 mg/dL 1.1 1.5(H) 1.3(H)  Bilirubin, Direct 0.0 - 0.3 mg/dL - - -   A/P: Patient compliant with atorvastatin 80 mg disease to be tolerating reasonably well.Interestingly LFTs actually improved after being started on statin.  Denies chest pain or shortness of breath in regards to CAD. Appears stable/well-controlled. For aortic atherosclerosis treatment is primarily risk factor modification- continue statin and blood pressure control.   #pacemaker due to history symptomatic bradycardia/sinus node dysfunction S: Patient follows with Dr. Lovena Le.  He has a pacemaker due to history of symptomatic bradycardia A/P: Doing well overall-continue follow-up with Dr. Lovena Le  Recommended follow  up: Return in about 3 months (around 03/17/2021) for follow up- or sooner if needed. Future Appointments  Date Time Provider [facility]  02/21/2021  4:20 PM Werner Lean, MD [facility]  10/24/2021  3:15 PM [facility]    Lab/Order associations:   ICD-10-CM   1. History of COVID-19  Z86.16   2. Poorly controlled type 2 diabetes mellitus (HCC)  E11.65 Microalbumin / creatinine urine ratio    CANCELED: Microalbumin / creatinine urine ratio  3. Aortic atherosclerosis (HCC) Chronic I70.0   4. Sinus node dysfunction (HCC) Chronic I49.5   5. Diabetes mellitus with coincident hypertension ([place])  E11.9    I10   6. Hyperlipidemia associated with type 2 diabetes mellitus ([place])  E11.69    E78.5    Return precautions advised.  Garret Reddish, MD

## 2020-12-17 ENCOUNTER — Ambulatory Visit (INDEPENDENT_AMBULATORY_CARE_PROVIDER_SITE_OTHER): Payer: Medicare HMO | Admitting: Family Medicine

## 2020-12-17 ENCOUNTER — Encounter: Payer: Self-pay | Admitting: Family Medicine

## 2020-12-17 VITALS — BP 126/70 | HR 60 | Temp 98.0°F | Ht 75.0 in | Wt 256.6 lb

## 2020-12-17 DIAGNOSIS — E1165 Type 2 diabetes mellitus with hyperglycemia: Secondary | ICD-10-CM | POA: Diagnosis not present

## 2020-12-17 DIAGNOSIS — I495 Sick sinus syndrome: Secondary | ICD-10-CM

## 2020-12-17 DIAGNOSIS — I700 Atherosclerosis of aorta: Secondary | ICD-10-CM

## 2020-12-17 DIAGNOSIS — E785 Hyperlipidemia, unspecified: Secondary | ICD-10-CM

## 2020-12-17 DIAGNOSIS — Z8616 Personal history of COVID-19: Secondary | ICD-10-CM | POA: Diagnosis not present

## 2020-12-17 DIAGNOSIS — I10 Essential (primary) hypertension: Secondary | ICD-10-CM | POA: Diagnosis not present

## 2020-12-17 DIAGNOSIS — E119 Type 2 diabetes mellitus without complications: Secondary | ICD-10-CM | POA: Diagnosis not present

## 2020-12-17 DIAGNOSIS — E1169 Type 2 diabetes mellitus with other specified complication: Secondary | ICD-10-CM

## 2020-12-17 DIAGNOSIS — Z23 Encounter for immunization: Secondary | ICD-10-CM

## 2020-12-18 LAB — MICROALBUMIN / CREATININE URINE RATIO
Creatinine, Urine: 59 mg/dL (ref 20–320)
Microalb Creat Ratio: 3 mcg/mg creat (ref <30)
Microalb, Ur: 0.2 mg/dL

## 2021-02-21 ENCOUNTER — Ambulatory Visit (INDEPENDENT_AMBULATORY_CARE_PROVIDER_SITE_OTHER): Payer: Medicare HMO | Admitting: Internal Medicine

## 2021-02-21 ENCOUNTER — Encounter: Payer: Self-pay | Admitting: Internal Medicine

## 2021-02-21 ENCOUNTER — Other Ambulatory Visit: Payer: Self-pay

## 2021-02-21 VITALS — BP 132/58 | HR 60 | Ht 75.0 in | Wt 264.2 lb

## 2021-02-21 DIAGNOSIS — E119 Type 2 diabetes mellitus without complications: Secondary | ICD-10-CM | POA: Diagnosis not present

## 2021-02-21 DIAGNOSIS — I2510 Atherosclerotic heart disease of native coronary artery without angina pectoris: Secondary | ICD-10-CM | POA: Diagnosis not present

## 2021-02-21 DIAGNOSIS — Z950 Presence of cardiac pacemaker: Secondary | ICD-10-CM | POA: Diagnosis not present

## 2021-02-21 DIAGNOSIS — I671 Cerebral aneurysm, nonruptured: Secondary | ICD-10-CM | POA: Diagnosis not present

## 2021-02-21 DIAGNOSIS — I10 Essential (primary) hypertension: Secondary | ICD-10-CM | POA: Diagnosis not present

## 2021-02-21 DIAGNOSIS — E785 Hyperlipidemia, unspecified: Secondary | ICD-10-CM

## 2021-02-21 NOTE — Patient Instructions (Signed)
Medication Instructions:  Your physician recommends that you continue on your current medications as directed. Please refer to the Current Medication list given to you today.  *If you need a refill on your cardiac medications before your next appointment, please call your pharmacy*   Lab Work: NONE If you have labs (blood work) drawn today and your tests are completely normal, you will receive your results only by: Marland Kitchen MyChart Message (if you have MyChart) OR . A paper copy in the mail If you have any lab test that is abnormal or we need to change your treatment, we will call you to review the results.   Testing/Procedures: NONE   Follow-Up: At [facility], you and your health needs are our priority.  As part of our continuing mission to provide you with exceptional heart care, we have created designated Provider Care Teams.  These Care Teams include your primary Cardiologist (physician) and Advanced Practice Providers (APPs -  Physician Assistants and Nurse Practitioners) who all work together to provide you with the care you need, when you need it.  We recommend signing up for the patient portal called "MyChart".  Sign up information is provided on this After Visit Summary.  MyChart is used to connect with patients for Virtual Visits (Telemedicine).  Patients are able to view lab/test results, encounter notes, upcoming appointments, etc.  Non-urgent messages can be sent to your provider as well.   To learn more about what you can do with MyChart, go to [email or web address].    Your next appointment:   5 month(s)  The format for your next appointment:   In Person  Provider:   You may see Christell Constant, MD or one of the following Advanced Practice Providers on your designated Care Team:    Ronie Spies, PA-C  Jacolyn Reedy, PA-C

## 2021-02-21 NOTE — Progress Notes (Signed)
Cardiology Office Note:    Date:  02/21/2021   ID:  Paul, Paul September 26, 1956, MRN [id number]  PCP:  Shelva Majestic, MD  [facility] Cardiologist:  Christell Constant, MD  [facility] Electrophysiologist:  Ladona Ridgel  Referring MD: Shelva Majestic, MD   CC: Follow up after PCI  History of Present Illness:    Paul Paul is a 65 y.o. male with a hx of Morbid Obesity, Diabetes with HTN, HLD, CAD s/p PCI mLAD 07/2020 on ASA and Brilinta, history of cerebral aneurysm s/p stenting and coiling.  Prior SSS s/p PPM (st. Jude) seen in follow up. Seen 8/18//21 for NSTEMI and had mLAD PCI.   Patient notes that he is doing well.  Since last visit had COVID-19 12/21.   Feels no residual SOB.  No chest pain or pressure .  No SOB/DOE and no PND/Orthopnea.  No weight gain or leg swelling.  No palpitations or syncope .  Now is working at [employer]  No bleeding or bruising despite this.   Past Medical History:  Diagnosis Date  . Anxiety   . Arthritis   . Bell palsy    > 30 years. Left side of face droops a little  . Bradycardia   . Cerebral aneurysm   . Cerebral aneurysm without rupture 06/2011   Middle of Cerebral Artery  . Complication of anesthesia 2012   after surgery for cliping of cerebral anerysum- In the room, patient was shaking, and patient would awaken and then "go back out" shaking continued after he "went back out:  . Constipation   . Diabetes mellitus    now under control  . Dysrhythmia    BRADYCARDIA REQUIRING PACEMAKER .FOLLOWED BY GREGG TAYLOR  . HYPERTENSION 12/10/2007  . Pacemaker   . Shoulder pain    L SHOULDER. RECENT FALL  . Sleep apnea    CPAP.NOT WEARING NOW.NEEDS REPLACEMENT PIECE    Past Surgical History:  Procedure Laterality Date  . CARDIAC CATHETERIZATION  07/06/11   normal  . CEREBRAL ANEURYSM REPAIR  06/2011   "stenting and coiling" per patient  . CORONARY STENT INTERVENTION N/A 07/21/2020   Procedure: CORONARY STENT INTERVENTION;  Surgeon:  Iran Ouch, MD;  [facility];  Service: Cardiovascular;  Laterality: N/A;  . FRACTURE SURGERY Left    foot  . INSERT / REPLACE / REMOVE PACEMAKER  2012  . KNEE SURGERY Left    left; arthoscopic  . LEFT HEART CATH AND CORONARY ANGIOGRAPHY N/A 07/21/2020   Procedure: LEFT HEART CATH AND CORONARY ANGIOGRAPHY;  Surgeon: Iran Ouch, MD;  [facility]: [facility];  Service: Cardiovascular;  Laterality: N/A;  . rotator cuff surgery Left 02/2012  . septal deviation    . SHOULDER ARTHROSCOPY WITH SUBACROMIAL DECOMPRESSION, ROTATOR CUFF REPAIR AND BICEP TENDON REPAIR Right 08/24/2015   Procedure: RIGHT SHOULDER DIAGNOSTIC OPERATIVE ARTHROSCOPY WITH DEBRIDEMENT, SUBACROMIAL DECOMPRESSION, MIMI-OPEN ROTATOR CUFF TEAR REPAIR AND POSSIBLE  BICEPS TENODESIS.  ;  Surgeon: Cammy Copa, MD;  Location: [facility] OR;  Service: Orthopedics;  Laterality: Right;  RIGHT SHOULDER DIAGNOSTIC OPERATIVE ARTHROSCOPY, DEBRIDEMENT, SUBACROMIAL DECOMPRESSION, MINI-OPEN ROTATOR CUFF TEAR REPAIR, POSSIB    Current Medications: Current Meds  Medication Sig  . aspirin EC 81 MG tablet Take 81 mg by mouth daily.  Marland Kitchen atorvastatin (LIPITOR) 80 MG tablet Take 1 tablet (80 mg total) by mouth daily.  . clopidogrel (PLAVIX) 75 MG tablet Take 1 tablet (75 mg total) by mouth daily.  For your first dose take 4 tablets by mouth, then decrease to 1 tablet by mouth daily.  Marland Kitchen. glimepiride (AMARYL) 4 MG tablet TAKE 2 TABLETS  BY MOUTH DAILY BEFORE BREAKFAST.  . metoprolol tartrate (LOPRESSOR) 25 MG tablet Take 25 mg by mouth daily.  . nitroGLYCERIN (NITROSTAT) 0.4 MG SL tablet Place 1 tablet (0.4 mg total) under the tongue every 5 (five) minutes as needed for chest pain (up to 3 doses. call for help if doesnt resolve after 1 dose.).     Allergies:   Doxycycline, Lisinopril, Losartan potassium-hctz, and Prednisone   Social History   Socioeconomic History  . Marital status: Married    Spouse name: Not on file   . Number of children: Not on file  . Years of education: Not on file  . Highest education level: Not on file  Occupational History  . Occupation: Disabled  Tobacco Use  . Smoking status: Former Smoker    Packs/day: 1.00    Years: 20.00    Pack years: 20.00    Types: Cigarettes    Quit date: 07/05/2011    Years since quitting: 9.6  . Smokeless tobacco: Never Used  Vaping Use  . Vaping Use: Never used  Substance and Sexual Activity  . Alcohol use: No    Alcohol/week: 0.0 standard drinks  . Drug use: No  . Sexual activity: Not on file  Other Topics Concern  . Not on file  Social History Narrative   Married over 30 years in 2015. 1 step son. 1 grandson.       Disabled from brain aneurysm, pacemaker. Retired from [employer] business-37 years.       Hobbies: grandson, exercise at [employer]   Social Determinants of Health   Financial Resource Strain: Low Risk   . Difficulty of Paying Living Expenses: Not hard at all  Food Insecurity: No Food Insecurity  . Worried About Programme researcher, broadcasting/film/videounning Out of Food in the Last Year: Never true  . Ran Out of Food in the Last Year: Never true  Transportation Needs: No Transportation Needs  . Lack of Transportation (Medical): No  . Lack of Transportation (Non-Medical): No  Physical Activity: Sufficiently Active  . Days of Exercise per Week: 5 days  . Minutes of Exercise per Session: 90 min  Stress: No Stress Concern Present  . Feeling of Stress : Not at all  Social Connections: Moderately Integrated  . Frequency of Communication with Friends and Family: Once a week  . Frequency of Social Gatherings with Friends and Family: Never  . Attends Religious Services: More than 4 times per year  . Active Member of Clubs or Organizations: Yes  . Attends BankerClub or Organization Meetings: 1 to 4 times per year  . Marital Status: Married    Social: Works at [employer]; has come to some visits with his wife  Family History: The patient's family history includes Diabetes in  his mother; Heart attack in his father and mother; Heart disease in his father and mother.  ROS:   Please see the history of present illness.    All other systems reviewed and are negative.  EKGs/Labs/Other Studies Reviewed:    The following studies were reviewed today:  EKG:   EKG 08/02/20 Sinus 60 with TWI  Transthoracic Echocardiogram: Date: 07/21/20 Results: 1. Left ventricular ejection fraction, by estimation, is 55 to 60%. The  left ventricle has normal function. The left ventricle has no regional  wall motion abnormalities. Left ventricular diastolic parameters were  normal.  2. Pacing wires in RA/RV. Right ventricular systolic function is normal.  The right ventricular size is normal.  3. The mitral valve is normal in structure. No evidence of mitral valve  regurgitation. No evidence of mitral stenosis.  4. The aortic valve was not well visualized. Aortic valve regurgitation  is not visualized. No aortic stenosis is present.  5. The inferior vena cava is normal in size with greater than 50%  respiratory variability, suggesting right atrial pressure of 3 mmHg.   Left/Right Heart Catheterizations: Date: 07/21/2020 Results:  1st Diag lesion is 60% stenosed.  Mid LAD lesion is 95% stenosed.  Post intervention, there is a 5% residual stenosis.  A drug-eluting stent was successfully placed using a STENT RESOLUTE ONYX 2.5X26.  Dist LAD lesion is 40% stenosed.   1.  Severe one-vessel coronary artery disease with 95% moderately calcified stenosis in the mid LAD which seems to be the culprit for non-ST elevation myocardial infarction.  The RCA is heavily calcified with mild diffuse disease.   2.  Mildly elevated left ventricular end-diastolic pressure at 15 mmHg. 3.  Successful angioplasty and drug-eluting stent placement to the mid LAD.  Recommendations: Dual antiplatelet therapy for at least 1 year. Aggressive treatment of risk factors. Likely discharge home  tomorrow if no issues.   Recent Labs: 07/28/2020: Hemoglobin 16.4; Platelets 193 09/28/2020: ALT 33; BUN 8; Creat 0.99; Potassium 4.0; Sodium 136  Recent Lipid Panel    Component Value Date/Time   CHOL 97 09/28/2020 0927   TRIG 115 09/28/2020 0927   HDL 38 (L) 09/28/2020 0927   CHOLHDL 2.6 09/28/2020 0927   VLDL 28 07/21/2020 0308   LDLCALC 39 09/28/2020 0927   LDLDIRECT 104.0 08/09/2017 1402    Physical Exam:    VS:  BP (!) 132/58   Pulse 60   Ht 6\' 3"  (1.905 m)   Wt 264 lb 3.2 oz (119.8 kg)   SpO2 97%   BMI 33.02 kg/m     Wt Readings from Last 3 Encounters:  02/21/21 264 lb 3.2 oz (119.8 kg)  12/17/20 256 lb 9.6 oz (116.4 kg)  11/18/20 255 lb (115.7 kg)    GEN: Well nourished, well developed in no acute distress HEENT: Normal NECK: No JVD; No carotid bruits LYMPHATICS: No lymphadenopathy CARDIAC: RRR, no murmurs, rubs, gallops, R radial site looks ok RESPIRATORY:  Clear to auscultation without rales, wheezing or rhonchi  ABDOMEN: Soft, non-tender, non-distended MUSCULOSKELETAL:  No edema; No deformity  SKIN: Warm and dry NEUROLOGIC:  Alert and oriented x 3 PSYCHIATRIC:  Normal affect   ASSESSMENT:    1. Cerebral aneurysm without rupture   2. Diabetes mellitus with coincident hypertension (HCC)   3. Coronary artery disease involving native coronary artery of native heart without angina pectoris   4. Hyperlipidemia, unspecified hyperlipidemia type   5. Pacemaker    PLAN:    In order of problems listed above:  Coronary Artery Disease; Obstructive with mLAD stents Obesity Diabetes with HTN HLD SSS s/p PPM Cerebral aneurysm s/p coiling - asymptomatic - anatomy: mLAD PCI, D1 60%, dLAD 40% (discussed his non-obstructive disease) - continue ASA 81 mg; continue plavix until 8/22 - continue statin, goal LDL < 70 - continue BB - continue PRN nitrates (has not needed any) - Sees Dr. 9/22 EP  August 2022 follow up unless new symptoms or abnormal test  results warranting change in plan  Would be reasonable for  Video Visit Follow up Would be reasonable for  APP Follow up    Medication Adjustments/Labs and Tests Ordered: Current medicines are reviewed at length with the patient today.  Concerns regarding medicines are outlined above.  No orders of the defined types were placed in this encounter.  No orders of the defined types were placed in this encounter.   There are no Patient Instructions on file for this visit.   Signed, Christell Constant, MD  02/21/2021 5:01 PM    [facility]

## 2021-02-28 ENCOUNTER — Telehealth: Payer: Self-pay | Admitting: Internal Medicine

## 2021-02-28 NOTE — Telephone Encounter (Signed)
Spoke with patient in regards to Plavix and associated bleeding.  I advised him to be extra cautious of things that could cause a prick or bleeding.  I explained to him that d/t his history of stents per Dr. Izora Ribas notes he wants him to stay on Plavix until 07/2021.  I told him to ensure that he applies pressure whenever he has a bleed.  I told him to call the office if bleeding increasing to filling up a guaze or inability to stop bleeding.  He is agreeable to plan will call if he has any further concerns.

## 2021-02-28 NOTE — Telephone Encounter (Signed)
Pt c/o medication issue: 1. Name of Medication: Plavix  2. How are you currently taking this medication (dosage and times per day)? 75 mg 3. Are you having a reaction (difficulty breathing--STAT)?  No 4. What is your medication issue? Patient states that he bleeds if he knick's himself

## 2021-04-06 ENCOUNTER — Other Ambulatory Visit: Payer: Self-pay | Admitting: Family Medicine

## 2021-06-01 ENCOUNTER — Other Ambulatory Visit: Payer: Self-pay | Admitting: Internal Medicine

## 2021-06-02 ENCOUNTER — Telehealth: Payer: Self-pay

## 2021-06-02 ENCOUNTER — Ambulatory Visit (INDEPENDENT_AMBULATORY_CARE_PROVIDER_SITE_OTHER): Payer: Medicare HMO

## 2021-06-02 ENCOUNTER — Ambulatory Visit (INDEPENDENT_AMBULATORY_CARE_PROVIDER_SITE_OTHER): Payer: Medicare HMO | Admitting: Orthopedic Surgery

## 2021-06-02 ENCOUNTER — Other Ambulatory Visit: Payer: Self-pay

## 2021-06-02 DIAGNOSIS — M1712 Unilateral primary osteoarthritis, left knee: Secondary | ICD-10-CM

## 2021-06-02 DIAGNOSIS — M25562 Pain in left knee: Secondary | ICD-10-CM | POA: Diagnosis not present

## 2021-06-02 NOTE — Telephone Encounter (Signed)
Can we get auth for left knee gel injection? 

## 2021-06-02 NOTE — Telephone Encounter (Signed)
Noted  

## 2021-06-02 NOTE — Progress Notes (Signed)
Office Visit Note   Patient: Paul Paul           Date of Birth: 25-Oct-1956           MRN: [id number] Visit Date: 06/02/2021 Requested by: Shelva Majestic, MD [address] [place],  [place] [address] PCP: Shelva Majestic, MD  Subjective: Chief Complaint  Patient presents with   Left Knee - Pain    HPI: Paul Paul is a 65 y.o. male who presents to the office complaining of left.knee  Pain for 3 weeks.  Started several hours after he rode his stationary bike at the gym he felt a pulling sensation in his anterior lateral left knee.  He notes occasional pain in the posterior knee as well.  He occasionally wakes with pain if he does not take Tylenol but as long as he takes Tylenol he is able to sleep through the night.  Denies any groin pain or radicular pain down the leg.  No mechanical symptoms or instability.  He has no history of knee surgery.  He does take Brilinta so he cannot take NSAIDs.  He has seen his cardiologist on 07/04/2021 and he states the plan is to discontinue the Brilinta at that time.  He also has history of diabetes and checks his blood glucose regularly which runs about 200.  States that he enjoys going to the gym and he is able to ride the stationary bike without any difficulty due to his knee..                ROS: All systems reviewed are negative as they relate to the chief complaint within the history of present illness.  Patient denies fevers or chills.  Assessment & Plan: Visit Diagnoses:  1. Left knee pain, unspecified chronicity   2. Unilateral primary osteoarthritis, left knee     Plan: Patient is a 65 year old male who presents with 3 weeks of left knee pain.  This is new for him.  Mostly centered over the lateral aspect of the left knee with moderate tenderness on exam today over the lateral joint line.  No mechanical symptoms or instability.  Radiographs show mild degenerative changes of the lateral compartment with early osteophyte formation and  joint space narrowing of the patellofemoral compartment.  Discussed options available to patient.  NSAIDs are not an option at this time due to him taking Brilinta.  Cortisone injection is not advised as it could spike his blood glucose fairly high.  Recommended he continue with Tylenol, non loadbearing quadricep strengthening exercises and try Voltaren gel and CBD.  We will also preapproved patient for gel injection.  Recommend he follow-up with his primary care physician to discuss blood glucose control prior to any cortisone injection.  This patient is diagnosed with osteoarthritis of the knee(s).    Radiographs show evidence of joint space narrowing, osteophytes, subchondral sclerosis and/or subchondral cysts.  This patient has knee pain which interferes with functional and activities of daily living.    This patient has experienced inadequate response, adverse effects and/or intolerance with conservative treatments such as acetaminophen, NSAIDS, topical creams, physical therapy or regular exercise, knee bracing and/or weight loss.   This patient has experienced inadequate response or has a contraindication to intra articular steroid injections for at least 3 months.   This patient is not scheduled to have a total knee replacement within 6 months of starting treatment with viscosupplementation.   Follow-Up Instructions: No follow-ups on file.  Orders:  Orders Placed This Encounter  Procedures   XR Knee 1-2 Views Left   No orders of the defined types were placed in this encounter.     Procedures: No procedures performed   Clinical Data: No additional findings.  Objective: Vital Signs: There were no vitals taken for this visit.  Physical Exam:  Constitutional: Patient appears well-developed HEENT:  Head: Normocephalic Eyes:EOM are normal Neck: Normal range of motion Cardiovascular: Normal rate Pulmonary/chest: Effort normal Neurologic: Patient is alert Skin: Skin is  warm Psychiatric: Patient has normal mood and affect  Ortho Exam: Ortho exam demonstrates left knee with 0 degrees extension and 120 degrees of knee flexion.  No effusion.  No tenderness over the medial joint line.  Negative patellar grind test.  Able to perform straight leg raise.  Mild to moderate tenderness over the lateral joint line.  No calf tenderness.  Negative Homans' sign.  No pain with hip range of motion.  Specialty Comments:  No specialty comments available.  Imaging: No results found.   PMFS History: Patient Active Problem List   Diagnosis Date Noted   Hyperlipidemia 10/19/2020   Aortic atherosclerosis (HCC) 07/28/2020   CAD (coronary artery disease) status post drug-eluting stent mid LAD August 2021 07/28/2020   Sinus node dysfunction (HCC) 08/05/2019   Fatty liver 06/18/2018   Hepatotoxicity due to statin drug 10/19/2017   Elevated LFTs 08/09/2017   Chronic pruritic rash in adult 08/09/2017   GAD (generalized anxiety disorder) 10/11/2016   OSA on CPAP 09/24/2014   Hyperlipidemia associated with type 2 diabetes mellitus (HCC) 09/24/2014   Obesity 01/01/2014   Other specified cardiac dysrhythmias(427.89) 07/09/2012   Cerebral aneurysm without rupture 01/01/2012   Pacemaker 10/07/2011   Poorly controlled type 2 diabetes mellitus (HCC) 11/09/2008   Diabetes mellitus with coincident hypertension (HCC) 12/10/2007   Past Medical History:  Diagnosis Date   Anxiety    Arthritis    Bell palsy    > 30 years. Left side of face droops a little   Bradycardia    Cerebral aneurysm    Cerebral aneurysm without rupture 06/2011   Middle of Cerebral Artery   Complication of anesthesia 2012   after surgery for cliping of cerebral anerysum- In the room, patient was shaking, and patient would awaken and then "go back out" shaking continued after he "went back out:   Constipation    Diabetes mellitus    now under control   Dysrhythmia    BRADYCARDIA REQUIRING PACEMAKER  .FOLLOWED BY GREGG TAYLOR   HYPERTENSION 12/10/2007   Pacemaker    Shoulder pain    L SHOULDER. RECENT FALL   Sleep apnea    CPAP.NOT WEARING NOW.NEEDS REPLACEMENT PIECE    Family History  Problem Relation Age of Onset   Heart attack Mother        died 25, smoker   Heart disease Mother    Diabetes Mother    Heart attack Father        died 72, smoker   Heart disease Father     Past Surgical History:  Procedure Laterality Date   CARDIAC CATHETERIZATION  07/06/11   normal   CEREBRAL ANEURYSM REPAIR  06/2011   "stenting and coiling" per patient   CORONARY STENT INTERVENTION N/A 07/21/2020   Procedure: CORONARY STENT INTERVENTION;  Surgeon: Iran Ouch, MD;  [facility];  Service: Cardiovascular;  Laterality: N/A;   FRACTURE SURGERY Left    foot   INSERT / REPLACE /  REMOVE PACEMAKER  2012   KNEE SURGERY Left    left; arthoscopic   LEFT HEART CATH AND CORONARY ANGIOGRAPHY N/A 07/21/2020   Procedure: LEFT HEART CATH AND CORONARY ANGIOGRAPHY;  Surgeon: Iran Ouch, MD;  [facility];  Service: Cardiovascular;  Laterality: N/A;   rotator cuff surgery Left 02/2012   septal deviation     SHOULDER ARTHROSCOPY WITH SUBACROMIAL DECOMPRESSION, ROTATOR CUFF REPAIR AND BICEP TENDON REPAIR Right 08/24/2015   Procedure: RIGHT SHOULDER DIAGNOSTIC OPERATIVE ARTHROSCOPY WITH DEBRIDEMENT, SUBACROMIAL DECOMPRESSION, MIMI-OPEN ROTATOR CUFF TEAR REPAIR AND POSSIBLE  BICEPS TENODESIS.  ;  Surgeon: Cammy Copa, MD;  Location: [facility];  Service: Orthopedics;  Laterality: Right;  RIGHT SHOULDER DIAGNOSTIC OPERATIVE ARTHROSCOPY, DEBRIDEMENT, SUBACROMIAL DECOMPRESSION, MINI-OPEN ROTATOR CUFF TEAR REPAIR, POSSIB   Social History   Occupational History   Occupation: Disabled  Tobacco Use   Smoking status: Former    Packs/day: 1.00    Years: 20.00    Pack years: 20.00    Types: Cigarettes    Quit date: 07/05/2011    Years since quitting: 9.9   Smokeless tobacco:  Never  Vaping Use   Vaping Use: Never used  Substance and Sexual Activity   Alcohol use: No    Alcohol/week: 0.0 standard drinks   Drug use: No   Sexual activity: Not on file

## 2021-06-05 ENCOUNTER — Encounter: Payer: Self-pay | Admitting: Orthopedic Surgery

## 2021-06-06 ENCOUNTER — Encounter: Payer: Self-pay | Admitting: Orthopedic Surgery

## 2021-06-14 ENCOUNTER — Telehealth: Payer: Self-pay

## 2021-06-14 ENCOUNTER — Telehealth: Payer: Self-pay | Admitting: Orthopedic Surgery

## 2021-06-14 ENCOUNTER — Telehealth: Payer: Self-pay | Admitting: Cardiovascular Disease

## 2021-06-14 NOTE — Telephone Encounter (Signed)
Called and made pt aware. He will get back with [place] and let us know what he decides, he is considering just doing an oral anti-inflammatory.

## 2021-06-14 NOTE — Telephone Encounter (Signed)
Pt called wanting to check on the approval status of his gel injection; he also wanted to make sure there are no steroids in the inj. Pt would like a CB to discuss both matters.  [phone]

## 2021-06-14 NOTE — Telephone Encounter (Signed)
Dr. Izora Ribas pt

## 2021-06-14 NOTE — Telephone Encounter (Signed)
Patient is calling in stating that his orthopedic recommended for a cortisone shot or anti-inflammatory medicine for his knee pain. Paul Paul wants Dr.Hunters opinion on if he should get the injection, patient is worried that it will mess or interact with his blood sugar medication.

## 2021-06-14 NOTE — Telephone Encounter (Signed)
Called pt back in regards to call in.  Pt does not know the name of medication that orthopedic dr will prescribe.  I advised him to find out the name of the medication and either call our office to have pharmacist review compatibility or have orthopedic dr have pharmacist review.  He expressed that he understands.  He is also questioning when he can stop taking blood thinner.  He reports that he will see Dr. Izora Ribas next month and will discuss this with him.

## 2021-06-14 NOTE — Telephone Encounter (Signed)
Can this be addressed without an OV?

## 2021-06-14 NOTE — Telephone Encounter (Signed)
Patient called to say that his orthopedic dr what to put him on new medication but the patient want to make sure it will be okay being that he is on blood thinner. Please advise

## 2021-06-14 NOTE — Telephone Encounter (Signed)
Lab Results  Component Value Date   HGBA1C 8.7 (A) 09/28/2020  Last a1c was high- yes an injection can raise hist level of blood sugars. He needs a visit to determine if a1c in safer range for injection.

## 2021-06-15 ENCOUNTER — Telehealth: Payer: Self-pay

## 2021-06-15 NOTE — Telephone Encounter (Signed)
Talked with patient concerning gel injection.  Advised patient that he will get a call once gel injection has been approved.  Patient voiced that he understands.

## 2021-06-15 NOTE — Telephone Encounter (Signed)
VOB has been submitted for Monovisc, left knee. Pending BV. 

## 2021-06-16 ENCOUNTER — Telehealth: Payer: Self-pay

## 2021-06-16 NOTE — Telephone Encounter (Signed)
Opening error 

## 2021-06-17 ENCOUNTER — Telehealth: Payer: Self-pay

## 2021-06-17 NOTE — Telephone Encounter (Signed)
Approved for Monovisc, left knee. Buy & Bill Patient will be responsible for 20% OOP. Co-pay of $20.00 PA Approval# [id number] Valid 06/15/2021- 09/13/2021  Appt. 07/04/2021 with Dr. August Saucer

## 2021-06-21 ENCOUNTER — Telehealth: Payer: Self-pay | Admitting: Orthopedic Surgery

## 2021-06-21 NOTE — Telephone Encounter (Signed)
Pt called and has questions about the gel injection that he is getting.   CB [phone]

## 2021-06-23 ENCOUNTER — Other Ambulatory Visit: Payer: Self-pay

## 2021-06-23 ENCOUNTER — Ambulatory Visit (INDEPENDENT_AMBULATORY_CARE_PROVIDER_SITE_OTHER): Payer: Medicare HMO

## 2021-06-23 DIAGNOSIS — I495 Sick sinus syndrome: Secondary | ICD-10-CM

## 2021-06-24 LAB — CUP PACEART INCLINIC DEVICE CHECK
Battery Remaining Longevity: 14 mo
Battery Voltage: 2.71 V
Brady Statistic RA Percent Paced: 57 %
Brady Statistic RV Percent Paced: 0.07 %
Date Time Interrogation Session: 20220721153200
Implantable Lead Implant Date: 20120801
Implantable Lead Implant Date: 20120801
Implantable Lead Location: 753859
Implantable Lead Location: 753860
Implantable Pulse Generator Implant Date: 20120801
Lead Channel Impedance Value: 512.5 Ohm
Lead Channel Impedance Value: 512.5 Ohm
Lead Channel Pacing Threshold Amplitude: 1 V
Lead Channel Pacing Threshold Amplitude: 1.125 V
Lead Channel Pacing Threshold Pulse Width: 0.4 ms
Lead Channel Pacing Threshold Pulse Width: 0.4 ms
Lead Channel Sensing Intrinsic Amplitude: 10.2 mV
Lead Channel Sensing Intrinsic Amplitude: 5 mV
Lead Channel Setting Pacing Amplitude: 1.375
Lead Channel Setting Pacing Amplitude: 2 V
Lead Channel Setting Pacing Pulse Width: 0.4 ms
Lead Channel Setting Sensing Sensitivity: 2 mV
Pulse Gen Model: 2210
Pulse Gen Serial Number: 7250770

## 2021-06-24 NOTE — Telephone Encounter (Signed)
noted 

## 2021-06-24 NOTE — Telephone Encounter (Signed)
Called and answered questions to his satisfaction

## 2021-07-04 ENCOUNTER — Ambulatory Visit (INDEPENDENT_AMBULATORY_CARE_PROVIDER_SITE_OTHER): Payer: Medicare HMO | Admitting: Orthopedic Surgery

## 2021-07-04 ENCOUNTER — Other Ambulatory Visit: Payer: Self-pay

## 2021-07-04 DIAGNOSIS — M1712 Unilateral primary osteoarthritis, left knee: Secondary | ICD-10-CM

## 2021-07-10 ENCOUNTER — Encounter: Payer: Self-pay | Admitting: Orthopedic Surgery

## 2021-07-10 NOTE — Progress Notes (Signed)
Patient came to have left knee Monovisc injection.  Taking Tylenol for pain.  Had heart attack last August.  His expectation was that Monovisc would get rid of most if not all of his pain.  Explained to him that is probably not the case.  Cortisone has not helped much.  In the end he decided not to have the Monovisc and he will just live with the arthritic pain that he has.  Essentially his options for treatment would be quad strengthening and none loadbearing exercise versus cortisone versus gel injection combination versus total knee replacement versus essentially modifying his activity to diminish the pain.

## 2021-07-20 ENCOUNTER — Encounter: Payer: Self-pay | Admitting: Internal Medicine

## 2021-07-20 ENCOUNTER — Other Ambulatory Visit: Payer: Self-pay

## 2021-07-20 ENCOUNTER — Ambulatory Visit (INDEPENDENT_AMBULATORY_CARE_PROVIDER_SITE_OTHER): Payer: Medicare HMO | Admitting: Internal Medicine

## 2021-07-20 VITALS — BP 130/60 | HR 60 | Ht 75.0 in | Wt 261.0 lb

## 2021-07-20 DIAGNOSIS — I152 Hypertension secondary to endocrine disorders: Secondary | ICD-10-CM | POA: Diagnosis not present

## 2021-07-20 DIAGNOSIS — I495 Sick sinus syndrome: Secondary | ICD-10-CM | POA: Diagnosis not present

## 2021-07-20 DIAGNOSIS — E1159 Type 2 diabetes mellitus with other circulatory complications: Secondary | ICD-10-CM | POA: Diagnosis not present

## 2021-07-20 DIAGNOSIS — I700 Atherosclerosis of aorta: Secondary | ICD-10-CM | POA: Diagnosis not present

## 2021-07-20 DIAGNOSIS — I2510 Atherosclerotic heart disease of native coronary artery without angina pectoris: Secondary | ICD-10-CM | POA: Diagnosis not present

## 2021-07-20 NOTE — Progress Notes (Addendum)
Cardiology Office Note:    Date:  07/20/2021   ID:  Paul, Paul 12/05/1955, MRN [id number]  PCP:  Paul Majestic, MD  [facility] Cardiologist:  Paul Constant, MD  [facility] Electrophysiologist:  Paul Paul  Referring MD: Paul Majestic, MD   CC: Follow up after PCI  History of Present Illness:    Paul Paul is a 65 y.o. male with a hx of Morbid Obesity, Diabetes with HTN, HLD, CAD s/p PCI mLAD 07/2020 on ASA and Brilinta, history of cerebral aneurysm s/p stenting and coiling.  Prior SSS s/p PPM (st. Jude) seen in follow up. Seen 8/18//21 for NSTEMI and had mLAD PCI.   Patient notes that he is doing well.  Since last visit notes  that if he scratches him self has significant bleeding or bruising requiring him to hold pressure.  Relevant interval testing or therapy include plavix transition.  There are no interval hospital/ED visit.    No chest pain or pressure .  No SOB/DOE and no PND/Orthopnea.  No weight gain or leg swelling.  No palpitations or syncope .  Plays baseball and is very active.    Past Medical History:  Diagnosis Date   Anxiety    Arthritis    Bell palsy    > 30 years. Left side of face droops a little   Bradycardia    Cerebral aneurysm    Cerebral aneurysm without rupture 06/2011   Middle of Cerebral Artery   Complication of anesthesia 2012   after surgery for cliping of cerebral anerysum- In the room, patient was shaking, and patient would awaken and then "go back out" shaking continued after he "went back out:   Constipation    Diabetes mellitus    now under control   Dysrhythmia    BRADYCARDIA REQUIRING PACEMAKER .FOLLOWED BY Paul Paul   HYPERTENSION 12/10/2007   Pacemaker    Shoulder pain    L SHOULDER. RECENT FALL   Sleep apnea    CPAP.NOT WEARING NOW.NEEDS REPLACEMENT PIECE    Past Surgical History:  Procedure Laterality Date   CARDIAC CATHETERIZATION  07/06/11   normal   CEREBRAL ANEURYSM REPAIR  06/2011   "stenting  and coiling" per patient   CORONARY STENT INTERVENTION N/A 07/21/2020   Procedure: CORONARY STENT INTERVENTION;  Surgeon: Paul Ouch, MD;  Location: [facility] INVASIVE CV LAB;  Service: Cardiovascular;  Laterality: N/A;   FRACTURE SURGERY Left    foot   INSERT / REPLACE / REMOVE PACEMAKER  2012   KNEE SURGERY Left    left; arthoscopic   LEFT HEART CATH AND CORONARY ANGIOGRAPHY N/A 07/21/2020   Procedure: LEFT HEART CATH AND CORONARY ANGIOGRAPHY;  Surgeon: Paul Ouch, MD;  Location: [facility] INVASIVE CV LAB;  Service: Cardiovascular;  Laterality: N/A;   rotator cuff surgery Left 02/2012   septal deviation     SHOULDER ARTHROSCOPY WITH SUBACROMIAL DECOMPRESSION, ROTATOR CUFF REPAIR AND BICEP TENDON REPAIR Right 08/24/2015   Procedure: RIGHT SHOULDER DIAGNOSTIC OPERATIVE ARTHROSCOPY WITH DEBRIDEMENT, SUBACROMIAL DECOMPRESSION, MIMI-OPEN ROTATOR CUFF TEAR REPAIR AND POSSIBLE  BICEPS TENODESIS.  ;  Surgeon: Paul Copa, MD;  Location: [facility] OR;  Service: Orthopedics;  Laterality: Right;  RIGHT SHOULDER DIAGNOSTIC OPERATIVE ARTHROSCOPY, DEBRIDEMENT, SUBACROMIAL DECOMPRESSION, MINI-OPEN ROTATOR CUFF TEAR REPAIR, POSSIB    Current Medications: Current Meds  Medication Sig   aspirin EC 81 MG tablet Take 81 mg by mouth daily.   atorvastatin (LIPITOR) 80 MG tablet TAKE 1 TABLET EVERY  DAY   glimepiride (AMARYL) 4 MG tablet TAKE 2 TABLETS  BY MOUTH DAILY BEFORE BREAKFAST.   nitroGLYCERIN (NITROSTAT) 0.4 MG SL tablet Place 1 tablet (0.4 mg total) under the tongue every 5 (five) minutes as needed for chest pain (up to 3 doses. call for help if doesnt resolve after 1 dose.).   [DISCONTINUED] clopidogrel (PLAVIX) 75 MG tablet Take 1 tablet (75 mg total) by mouth daily. For your first dose take 4 tablets by mouth, then decrease to 1 tablet by mouth daily.   [DISCONTINUED] metoprolol tartrate (LOPRESSOR) 25 MG tablet Take 1 tablet (25 mg total) by mouth daily.     Allergies:   Doxycycline, Lisinopril,  Losartan potassium-hctz, and Prednisone   Social History   Socioeconomic History   Marital status: Married    Spouse name: Not on file   Number of children: Not on file   Years of education: Not on file   Highest education level: Not on file  Occupational History   Occupation: Disabled  Tobacco Use   Smoking status: Former    Packs/day: 1.00    Years: 20.00    Pack years: 20.00    Types: Cigarettes    Quit date: 07/05/2011    Years since quitting: 10.0   Smokeless tobacco: Never  Vaping Use   Vaping Use: Never used  Substance and Sexual Activity   Alcohol use: No    Alcohol/week: 0.0 standard drinks   Drug use: No   Sexual activity: Not on file  Other Topics Concern   Not on file  Social History Narrative   Married over 30 years in 2015. 1 step son. 1 grandson.       Disabled from brain aneurysm, pacemaker. Retired from [employer] years.       Hobbies: grandson, exercise at [facility]   Social Determinants of Health   Financial Resource Strain: Low Risk    Difficulty of Paying Living Expenses: Not hard at all  Food Insecurity: No Food Insecurity   Worried About Programme researcher, broadcasting/film/video in the Last Year: Never true   Barista in the Last Year: Never true  Transportation Needs: No Transportation Needs   Lack of Transportation (Medical): No   Lack of Transportation (Non-Medical): No  Physical Activity: Sufficiently Active   Days of Exercise per Week: 5 days   Minutes of Exercise per Session: 90 min  Stress: No Stress Concern Present   Feeling of Stress : Not at all  Social Connections: Moderately Integrated   Frequency of Communication with Friends and Family: Once a week   Frequency of Social Gatherings with Friends and Family: Never   Attends Religious Services: More than 4 times per year   Active Member of [employer] or Organizations: Yes   Attends Engineer, structural: 1 to 4 times per year   Marital Status: Married    Social: Works at [employer]; has  come to some visits with his wife  Family History: The patient's family history includes Diabetes in his mother; Heart attack in his father and mother; Heart disease in his father and mother.  ROS:   Please see the history of present illness.    All other systems reviewed and are negative.  EKGs/Labs/Other Studies Reviewed:    The following studies were reviewed today:  EKG:   07/20/2021: SR rate 60 EKG 08/02/20 Sinus 60 with TWI  Transthoracic Echocardiogram: Date: 07/21/20 Results: 1. Left ventricular ejection fraction, by estimation, is 55 to  60%. The  left ventricle has normal function. The left ventricle has no regional  wall motion abnormalities. Left ventricular diastolic parameters were  normal.   2. Pacing wires in RA/RV. Right ventricular systolic function is normal.  The right ventricular size is normal.   3. The mitral valve is normal in structure. No evidence of mitral valve  regurgitation. No evidence of mitral stenosis.   4. The aortic valve was not well visualized. Aortic valve regurgitation  is not visualized. No aortic stenosis is present.   5. The inferior vena cava is normal in size with greater than 50%  respiratory variability, suggesting right atrial pressure of 3 mmHg.   Left/Right Heart Catheterizations: Date: 07/21/2020 Results: 1st Diag lesion is 60% stenosed. Mid LAD lesion is 95% stenosed. Post intervention, there is a 5% residual stenosis. A drug-eluting stent was successfully placed using a STENT RESOLUTE ONYX 2.5X26. Dist LAD lesion is 40% stenosed.   1.  Severe one-vessel coronary artery disease with 95% moderately calcified stenosis in the mid LAD which seems to be the culprit for non-ST elevation myocardial infarction.  The RCA is heavily calcified with mild diffuse disease.   2.  Mildly elevated left ventricular end-diastolic pressure at 15 mmHg. 3.  Successful angioplasty and drug-eluting stent placement to the mid LAD.    Recommendations: Dual antiplatelet therapy for at least 1 year. Aggressive treatment of risk factors. Likely discharge home tomorrow if no issues.   Recent Labs: 07/28/2020: Hemoglobin 16.4; Platelets 193 09/28/2020: ALT 33; BUN 8; Creat 0.99; Potassium 4.0; Sodium 136  Recent Lipid Panel    Component Value Date/Time   CHOL 97 09/28/2020 0927   TRIG 115 09/28/2020 0927   HDL 38 (L) 09/28/2020 0927   CHOLHDL 2.6 09/28/2020 0927   VLDL 28 07/21/2020 0308   LDLCALC 39 09/28/2020 0927   LDLDIRECT 104.0 08/09/2017 1402    Physical Exam:    VS:  BP 130/60   Pulse 60   Ht 6\' 3"  (1.905 m)   Wt 261 lb (118.4 kg)   SpO2 95%   BMI 32.62 kg/m     Wt Readings from Last 3 Encounters:  07/20/21 261 lb (118.4 kg)  02/21/21 264 lb 3.2 oz (119.8 kg)  12/17/20 256 lb 9.6 oz (116.4 kg)    GEN: Well nourished, well developed in no acute distress HEENT: Normal NECK: No JVD LYMPHATICS: No lymphadenopathy CARDIAC: RRR, no murmurs, rubs, gallops RESPIRATORY:  Clear to auscultation without rales, wheezing or rhonchi  ABDOMEN: Soft, non-tender, non-distended MUSCULOSKELETAL:  No edema; No deformity  SKIN: Warm and dry NEUROLOGIC:  Alert and oriented x 3 PSYCHIATRIC:  Normal affect   ASSESSMENT:    1. Coronary artery disease involving native coronary artery of native heart without angina pectoris   2. Hypertension associated with diabetes (HCC)   3. Sinus node dysfunction (HCC)   4. Aortic atherosclerosis (HCC)    PLAN:    In order of problems listed above:  Coronary Artery Disease; Obstructive with mLAD stents Obesity Diabetes with HTN HLD and aortic atherosclerosis SSS s/p PPM Cerebral aneurysm s/p coiling - asymptomatic - anatomy: mLAD PCI, D1 60%, dLAD 40%  - continue ASA 81 mg; will DC plavix - continue statin, goal LDL < 70 (at goal) - discussed with patient pros and cons of stopping BB; if would like to stop.  We discussed that if recurrent angina this may be related  to his D1 disease and we will resume his metoprolol - Sees Dr.  Ladona Paul EP   August 2023 follow up unless new symptoms or abnormal test results warranting change in plan  ADDENDUM:  Has chart review history of hepatotoxicty on statins:  On present statin Last ALT 33.  Will monitor for statin intolerance.   Medication Adjustments/Labs and Tests Ordered: Current medicines are reviewed at length with the patient today.  Concerns regarding medicines are outlined above.  Orders Placed This Encounter  Procedures   EKG 12-Lead   No orders of the defined types were placed in this encounter.   Patient Instructions  Medication Instructions:  Your physician has recommended you make the following change in your medication:  STOP: clopidogrel (Plavix) STOP: metoprolol  *If you need a refill on your cardiac medications before your next appointment, please call your pharmacy*   Lab Work: NONE If you have labs (blood work) drawn today and your tests are completely normal, you will receive your results only by: MyChart Message (if you have MyChart) OR A paper copy in the mail If you have any lab test that is abnormal or we need to change your treatment, we will call you to review the results.   Testing/Procedures: NONE   Follow-Up: At [facility], you and your health needs are our priority.  As part of our continuing mission to provide you with exceptional heart care, we have created designated Provider Care Teams.  These Care Teams include your primary Cardiologist (physician) and Advanced Practice Providers (APPs -  Physician Assistants and Nurse Practitioners) who all work together to provide you with the care you need, when you need it.  We recommend signing up for the patient portal called "MyChart".  Sign up information is provided on this After Visit Summary.  MyChart is used to connect with patients for Virtual Visits (Telemedicine).  Patients are able to view lab/test results,  encounter notes, upcoming appointments, etc.  Non-urgent messages can be sent to your provider as well.   To learn more about what you can do with MyChart, go to [email or web address].    Your next appointment:   12 month(s)  The format for your next appointment:   In Person  Provider:   You may see Paul Constant, MD or one of the following Advanced Practice Providers on your designated Care Team:   Ronie Spies, PA-C Jacolyn Reedy, PA-C        Signed, Paul Constant, MD  07/20/2021 5:34 PM    [facility]

## 2021-07-20 NOTE — Patient Instructions (Signed)
Medication Instructions:  Your physician has recommended you make the following change in your medication:  STOP: clopidogrel (Plavix) STOP: metoprolol  *If you need a refill on your cardiac medications before your next appointment, please call your pharmacy*   Lab Work: NONE If you have labs (blood work) drawn today and your tests are completely normal, you will receive your results only by: MyChart Message (if you have MyChart) OR A paper copy in the mail If you have any lab test that is abnormal or we need to change your treatment, we will call you to review the results.   Testing/Procedures: NONE   Follow-Up: At [facility], you and your health needs are our priority.  As part of our continuing mission to provide you with exceptional heart care, we have created designated Provider Care Teams.  These Care Teams include your primary Cardiologist (physician) and Advanced Practice Providers (APPs -  Physician Assistants and Nurse Practitioners) who all work together to provide you with the care you need, when you need it.  We recommend signing up for the patient portal called "MyChart".  Sign up information is provided on this After Visit Summary.  MyChart is used to connect with patients for Virtual Visits (Telemedicine).  Patients are able to view lab/test results, encounter notes, upcoming appointments, etc.  Non-urgent messages can be sent to your provider as well.   To learn more about what you can do with MyChart, go to [email or web address].    Your next appointment:   12 month(s)  The format for your next appointment:   In Person  Provider:   You may see Christell Constant, MD or one of the following Advanced Practice Providers on your designated Care Team:   Ronie Spies, PA-C Jacolyn Reedy, PA-C

## 2021-08-15 ENCOUNTER — Other Ambulatory Visit: Payer: Self-pay | Admitting: Internal Medicine

## 2021-08-18 ENCOUNTER — Encounter: Payer: Self-pay | Admitting: Physician Assistant

## 2021-08-18 ENCOUNTER — Telehealth (INDEPENDENT_AMBULATORY_CARE_PROVIDER_SITE_OTHER): Payer: Medicare HMO | Admitting: Physician Assistant

## 2021-08-18 VITALS — Ht 75.0 in

## 2021-08-18 DIAGNOSIS — R059 Cough, unspecified: Secondary | ICD-10-CM

## 2021-08-18 DIAGNOSIS — R0989 Other specified symptoms and signs involving the circulatory and respiratory systems: Secondary | ICD-10-CM

## 2021-08-18 MED ORDER — AZITHROMYCIN 250 MG PO TABS: ORAL_TABLET | ORAL | 0 refills | Status: DC

## 2021-08-18 MED ORDER — AMOXICILLIN 500 MG PO CAPS: 1000.0000 mg | ORAL_CAPSULE | Freq: Two times a day (BID) | ORAL | 0 refills | Status: AC

## 2021-08-18 NOTE — Patient Instructions (Signed)
Please take the medications as directed.  I have sent 2 antibiotics to cover for possible pneumonia.  Continue to take plain Mucinex over-the-counter to help with your chest congestion.  Rest and drink plenty of fluids.  Call next week with an update.  If still worse or no improvement, neck step will be to order chest x-ray.

## 2021-08-18 NOTE — Progress Notes (Signed)
Virtual Visit via Video Note  I connected with  Paul Paul  on 08/18/21 at  9:00 AM EDT by a video enabled telemedicine application and verified that I am speaking with the correct person using two identifiers.  Location: Patient: home Provider: Nature conservation officer at [employer] Persons present: Patient and myself   I discussed the limitations of evaluation and management by telemedicine and the availability of in person appointments. The patient expressed understanding and agreed to proceed.   History of Present Illness: Chief complaint: Cough & congestion Symptom onset: 2 weeks Pertinent positives: Producitve cough, nasal congestion Pertinent negatives: Fever, chills, body aches, SOB  Treatments tried: Liquid Mucinex, Tylenol, Nyquil, Sick exposure: Wife has had similar symptoms Negative at home COVID-19 test.    Observations/Objective:  Gen: Awake, alert, no acute distress, very congested sounding Resp: Breathing is even and non-labored; very productive cough Psych: calm/pleasant demeanor Neuro: Alert and Oriented x 3, + facial symmetry, speech is clear.   Assessment and Plan: 1. Chest congestion 2. Cough >2 weeks of symptoms.  States that he is COVID 19 negative per home test.  He also previously had this illness and does not feel like he has any similar symptoms.  I am worried about the amount of chest congestion and productive cough that he has going on.  I am going to cover him for possible walking pneumonia.  I have sent amoxicillin and a Z-Pak to his pharmacy.  He was also advised to take plain Mucinex to help with the congestion.  He is going to call with an update next week and if still worse or no improvement neck step will be to visit in person & check a chest x-ray.   Follow Up Instructions:    I discussed the assessment and treatment plan with the patient. The patient was provided an opportunity to ask questions and all were answered. The patient  agreed with the plan and demonstrated an understanding of the instructions.   The patient was advised to call back or seek an in-person evaluation if the symptoms worsen or if the condition fails to improve as anticipated.  Kadan Millstein M Lliam Hoh, PA-C

## 2021-08-31 ENCOUNTER — Telehealth: Payer: Self-pay

## 2021-08-31 NOTE — Telephone Encounter (Signed)
Was treated for possible walking pneumonia- need to confirm diagnosis before more antibiotics- lets get chest x-ray under cough and chest congestion. Please order/set him up for this - if no pneumonia may need to look at alternate diagnosis

## 2021-08-31 NOTE — Telephone Encounter (Signed)
Patient is calling in stating that he did a virtual visit with Alyssa last week, the medication was helping but now feels like it is coming back. Wanting to know if he can get another prescription sent in.

## 2021-09-01 ENCOUNTER — Ambulatory Visit (INDEPENDENT_AMBULATORY_CARE_PROVIDER_SITE_OTHER)
Admission: RE | Admit: 2021-09-01 | Discharge: 2021-09-01 | Disposition: A | Discharge status: Home / Self Care | Payer: Medicare HMO | Source: Ambulatory Visit | Attending: Family Medicine | Admitting: Family Medicine

## 2021-09-01 ENCOUNTER — Other Ambulatory Visit: Payer: Self-pay

## 2021-09-01 DIAGNOSIS — R0989 Other specified symptoms and signs involving the circulatory and respiratory systems: Secondary | ICD-10-CM | POA: Diagnosis not present

## 2021-09-01 DIAGNOSIS — R059 Cough, unspecified: Secondary | ICD-10-CM

## 2021-09-01 DIAGNOSIS — R062 Wheezing: Secondary | ICD-10-CM | POA: Diagnosis not present

## 2021-09-01 NOTE — Telephone Encounter (Signed)
I spoke to pt to make him aware.

## 2021-09-01 NOTE — Telephone Encounter (Signed)
Orders placed.

## 2021-09-05 ENCOUNTER — Other Ambulatory Visit: Payer: Self-pay

## 2021-09-05 MED ORDER — AMOXICILLIN-POT CLAVULANATE 875-125 MG PO TABS: 1.0000 | ORAL_TABLET | Freq: Two times a day (BID) | ORAL | 0 refills | Status: AC

## 2021-10-06 ENCOUNTER — Encounter: Payer: Self-pay | Admitting: Family Medicine

## 2021-10-06 ENCOUNTER — Telehealth (INDEPENDENT_AMBULATORY_CARE_PROVIDER_SITE_OTHER): Payer: Medicare HMO | Admitting: Family Medicine

## 2021-10-06 VITALS — Wt 260.0 lb

## 2021-10-06 DIAGNOSIS — J209 Acute bronchitis, unspecified: Secondary | ICD-10-CM

## 2021-10-06 DIAGNOSIS — E1165 Type 2 diabetes mellitus with hyperglycemia: Secondary | ICD-10-CM

## 2021-10-06 MED ORDER — AMOXICILLIN-POT CLAVULANATE 875-125 MG PO TABS
1.0000 | ORAL_TABLET | Freq: Two times a day (BID) | ORAL | 0 refills | Status: DC
Start: 2021-10-06 — End: 2021-10-24

## 2021-10-06 MED ORDER — GUAIFENESIN-CODEINE 100-10 MG/5ML PO SOLN: 5.0000 mL | Freq: Three times a day (TID) | ORAL | 0 refills | Status: DC | PRN

## 2021-10-06 NOTE — Progress Notes (Signed)
[facility] [address] [place] [address] Dept: [phone] Dept Fax: [phone]  Virtual Video Visit  I connected with Paul Paul on 10/06/21 at  2:00 PM EDT by a video enabled telemedicine application and verified that I am speaking with the correct person using two identifiers.  Location patient: Home Location provider: Home Office Persons participating in the virtual visit: Patient, Provider  I discussed the limitations of evaluation and management by telemedicine and the availability of in person appointments. The patient expressed understanding and agreed to proceed.  Chief Complaint  Patient presents with   Acute Visit    C/o having ST, cough, runny nose x 2 days.   He has been taking Tylenol.   Negative covid test.      SUBJECTIVE:  HPI: Paul Paul is a 65 y.o. male who presents with a 2-day history of sore throat, cough, and runny nose. He ran a home COVID test which was negative. He denies any fever. Paul Paul describes having a similar illness over a month ago. He states he was treated with both amoxicillin and a Z-pack initially. As he was not improving, he had a chest x-ray and then was prescribed Augmentin. After the 2nd course of antibiotics, he finally got better. Now, about 3 weeks later, he is having a return of respiratory symptoms. He notes his grandson is playing a  ports tournament this weekend, and he doesn't;t want to have bronchitis or pneumonia keeping him from attending.  Paul Paul has a history of CAD, hypertension, and Type 2 DM. He states his blood sugars are up and down. He notes that he primarily controls this with diet. He is a former smoker (20 pack-year history having quit in 2012 after his heart attack). He denies any history of COPD.  Patient Active Problem List   Diagnosis Date Noted   Hyperlipidemia 10/19/2020   Aortic atherosclerosis (HCC) 07/28/2020   CAD (coronary artery  disease) status post drug-eluting stent mid LAD August 2021 07/28/2020   Sinus node dysfunction (HCC) 08/05/2019   Fatty liver 06/18/2018   Hepatotoxicity due to statin drug 10/19/2017   Elevated LFTs 08/09/2017   Chronic pruritic rash in adult 08/09/2017   GAD (generalized anxiety disorder) 10/11/2016   OSA on CPAP 09/24/2014   Hyperlipidemia associated with type 2 diabetes mellitus (HCC) 09/24/2014   Obesity 01/01/2014   Other specified cardiac dysrhythmias(427.89) 07/09/2012   Cerebral aneurysm without rupture 01/01/2012   Pacemaker 10/07/2011   Poorly controlled type 2 diabetes mellitus (HCC) 11/09/2008   Hypertension associated with diabetes (HCC) 12/10/2007   Past Surgical History:  Procedure Laterality Date   CARDIAC CATHETERIZATION  07/06/11   normal   CEREBRAL ANEURYSM REPAIR  06/2011   "stenting and coiling" per patient   CORONARY STENT INTERVENTION N/A 07/21/2020   Procedure: CORONARY STENT INTERVENTION;  Surgeon: Iran Ouch, MD;  Location: [facility];  Service: Cardiovascular;  Laterality: N/A;   FRACTURE SURGERY Left    foot   INSERT / REPLACE / REMOVE PACEMAKER  2012   KNEE SURGERY Left    left; arthoscopic   LEFT HEART CATH AND CORONARY ANGIOGRAPHY N/A 07/21/2020   Procedure: LEFT HEART CATH AND CORONARY ANGIOGRAPHY;  Surgeon: Iran Ouch, MD;  Location: [facility];  Service: Cardiovascular;  Laterality: N/A;   rotator cuff surgery Left 02/2012   septal deviation     SHOULDER ARTHROSCOPY WITH SUBACROMIAL DECOMPRESSION, ROTATOR CUFF REPAIR AND  BICEP TENDON REPAIR Right 08/24/2015   Procedure: RIGHT SHOULDER DIAGNOSTIC OPERATIVE ARTHROSCOPY WITH DEBRIDEMENT, SUBACROMIAL DECOMPRESSION, MIMI-OPEN ROTATOR CUFF TEAR REPAIR AND POSSIBLE  BICEPS TENODESIS.  ;  Surgeon: Cammy Copa, MD;  Location: [facility] OR;  Service: Orthopedics;  Laterality: Right;  RIGHT SHOULDER DIAGNOSTIC OPERATIVE ARTHROSCOPY, DEBRIDEMENT, SUBACROMIAL DECOMPRESSION, MINI-OPEN  ROTATOR CUFF TEAR REPAIR, POSSIB   Family History  Problem Relation Age of Onset   Heart attack Mother        died 75, smoker   Heart disease Mother    Diabetes Mother    Heart attack Father        died 8, smoker   Heart disease Father    Social History   Tobacco Use   Smoking status: Former    Packs/day: 1.00    Years: 20.00    Pack years: 20.00    Types: Cigarettes    Quit date: 07/05/2011    Years since quitting: 10.2   Smokeless tobacco: Never  Vaping Use   Vaping Use: Never used  Substance Use Topics   Alcohol use: No    Alcohol/week: 0.0 standard drinks   Drug use: No    Current Outpatient Medications:    amoxicillin-clavulanate (AUGMENTIN) 875-125 MG tablet, Take 1 tablet by mouth 2 (two) times daily., Disp: 20 tablet, Rfl: 0   aspirin EC 81 MG tablet, Take 81 mg by mouth daily., Disp: , Rfl:    atorvastatin (LIPITOR) 80 MG tablet, TAKE 1 TABLET EVERY DAY, Disp: 90 tablet, Rfl: 2   glimepiride (AMARYL) 4 MG tablet, TAKE 2 TABLETS  BY MOUTH DAILY BEFORE BREAKFAST., Disp: 180 tablet, Rfl: 1   guaiFENesin-codeine 100-10 MG/5ML syrup, Take 5 mLs by mouth 3 (three) times daily as needed for cough., Disp: 120 mL, Rfl: 0   nitroGLYCERIN (NITROSTAT) 0.4 MG SL tablet, Place 1 tablet (0.4 mg total) under the tongue every 5 (five) minutes as needed for chest pain (up to 3 doses. call for help if doesnt resolve after 1 dose.)., Disp: 30 tablet, Rfl: 3  Allergies  Allergen Reactions   Doxycycline Rash and Nausea Only   Lisinopril Rash   Losartan Potassium-Hctz Other (See Comments)    Caused dizziness & fatigue   Prednisone Other (See Comments)    Raises blood sugar   ROS: See pertinent positives and negatives per HPI.  OBSERVATIONS/OBJECTIVE:  VITALS per patient if applicable: Today's Vitals   10/06/21 1349  Weight: 260 lb (117.9 kg)   Body mass index is 32.5 kg/m.   GENERAL: Alert and oriented. Appears well and in no acute distress.  HEENT: Atraumatic.  Conjunctiva clear. No obvious abnormalities on inspection of external nose and ears.  NECK: Normal movements of the head and neck.  LUNGS: On inspection, no signs of respiratory distress. Breathing rate appears normal. No obvious gross SOB, gasping or wheezing, and no conversational dyspnea. Mild to moderate cough noted.  CV: No obvious cyanosis.  PSYCH/NEURO: Pleasant and cooperative. No obvious depression or anxiety. Speech and thought processing grossly intact.  Imaging: Chest x-ray (09/01/2021) IMPRESSION: Bronchitic changes without infiltrate.   Aortic Atherosclerosis (ICD10-I70.0).  ASSESSMENT AND PLAN:  1. Acute bronchitis, unspecified organism Paul Paul is experiencing a recurrence of bronchitis symptoms. It is unclear that he needed antibiotics for his previous illness, though this may have been appropriate if this was more of an acute exacerbation of chronic bronchitis. In light of his prior smoking history, it is possible that he has some COPD. With the recurrent symptoms,  he may need to do PFTs once he is improved to see if this is present. Out of caution, I will renew a course of Augmentin. I asked that he follow-up with his PCP for reassessment.  - amoxicillin-clavulanate (AUGMENTIN) 875-125 MG tablet; Take 1 tablet by mouth 2 (two) times daily.  Dispense: 20 tablet; Refill: 0 - guaiFENesin-codeine 100-10 MG/5ML syrup; Take 5 mLs by mouth 3 (three) times daily as needed for cough.  Dispense: 120 mL; Refill: 0  2. Poorly controlled type 2 diabetes mellitus (HCC) Review of prior labs shows his A1c has been quite high, consistent with uncontrolled diabetes. He appears to have a mistaken belief that this is doing just fine. I mentioned to him that uncontrolled diabetes could be putting him at risk for recurrent respiratory infections. I strongly encouraged him to follow-up with his PCP to reassess how this si doing.   I discussed the assessment and treatment plan with the  patient. The patient was provided an opportunity to ask questions and all were answered. The patient agreed with the plan and demonstrated an understanding of the instructions.   The patient was advised to call back or seek an in-person evaluation if the symptoms worsen or if the condition fails to improve as anticipated.   Loyola Mast, MD

## 2021-10-24 ENCOUNTER — Ambulatory Visit (INDEPENDENT_AMBULATORY_CARE_PROVIDER_SITE_OTHER): Payer: Medicare HMO

## 2021-10-24 DIAGNOSIS — Z0000 Encounter for general adult medical examination without abnormal findings: Secondary | ICD-10-CM

## 2021-10-24 NOTE — Patient Instructions (Signed)
Paul Paul , Thank you for taking time to come for your Medicare Wellness Visit. I appreciate your ongoing commitment to your health goals. Please review the following plan we discussed and let me know if I can assist you in the future.   Screening recommendations/referrals: Colonoscopy: Discontinued Recommended yearly ophthalmology/optometry visit for glaucoma screening and checkup Recommended yearly dental visit for hygiene and checkup  Vaccinations: Influenza vaccine: Due and discussed  Pneumococcal vaccine: Up to date Tdap vaccine: Done 01/01/12 repeat every 10 years  Shingles vaccine: Shingrix discussed. Please contact your pharmacy for coverage information.    Covid-19: Completed 02/15/20  Advanced directives: Advance directive discussed with you today. Even though you declined this today please call our office should you change your mind and we can give you the proper paperwork for you to fill out.  Conditions/risks identified: None at this time  Next appointment: Follow up in one year for your annual wellness visit.   Preventive Care 66 Years and Older, Male Preventive care refers to lifestyle choices and visits with your health care provider that can promote health and wellness. What does preventive care include? A yearly physical exam. This is also called an annual well check. Dental exams once or twice a year. Routine eye exams. Ask your health care provider how often you should have your eyes checked. Personal lifestyle choices, including: Daily care of your teeth and gums. Regular physical activity. Eating a healthy diet. Avoiding tobacco and drug use. Limiting alcohol use. Practicing safe sex. Taking low doses of aspirin every day. Taking vitamin and mineral supplements as recommended by your health care provider. What happens during an annual well check? The services and screenings done by your health care provider during your annual well check will depend on your  age, overall health, lifestyle risk factors, and family history of disease. Counseling  Your health care provider may ask you questions about your: Alcohol use. Tobacco use. Drug use. Emotional well-being. Home and relationship well-being. Sexual activity. Eating habits. History of falls. Memory and ability to understand (cognition). Work and work Astronomer. Screening  You may have the following tests or measurements: Height, weight, and BMI. Blood pressure. Lipid and cholesterol levels. These may be checked every 5 years, or more frequently if you are over 66 years old. Skin check. Lung cancer screening. You may have this screening every year starting at age 66 if you have a 30-pack-year history of smoking and currently smoke or have quit within the past 15 years. Fecal occult blood test (FOBT) of the stool. You may have this test every year starting at age 66. Flexible sigmoidoscopy or colonoscopy. You may have a sigmoidoscopy every 5 years or a colonoscopy every 10 years starting at age 66. Prostate cancer screening. Recommendations will vary depending on your family history and other risks. Hepatitis C blood test. Hepatitis B blood test. Sexually transmitted disease (STD) testing. Diabetes screening. This is done by checking your blood sugar (glucose) after you have not eaten for a while (fasting). You may have this done every 1-3 years. Abdominal aortic aneurysm (AAA) screening. You may need this if you are a current or former smoker. Osteoporosis. You may be screened starting at age 66 if you are at high risk. Talk with your health care provider about your test results, treatment options, and if necessary, the need for more tests. Vaccines  Your health care provider may recommend certain vaccines, such as: Influenza vaccine. This is recommended every year. Tetanus, diphtheria, and acellular  pertussis (Tdap, Td) vaccine. You may need a Td booster every 10 years. Zoster  vaccine. You may need this after age 46. Pneumococcal 13-valent conjugate (PCV13) vaccine. One dose is recommended after age 66. Pneumococcal polysaccharide (PPSV23) vaccine. One dose is recommended after age 66. Talk to your health care provider about which screenings and vaccines you need and how often you need them. This information is not intended to replace advice given to you by your health care provider. Make sure you discuss any questions you have with your health care provider. Document Released: 12/17/2015 Document Revised: 08/09/2016 Document Reviewed: 09/21/2015 Elsevier Interactive Patient Education  2017 [employer] Prevention in the Home Falls can cause injuries. They can happen to people of all ages. There are many things you can do to make your home safe and to help prevent falls. What can I do on the outside of my home? Regularly fix the edges of walkways and driveways and fix any cracks. Remove anything that might make you trip as you walk through a door, such as a raised step or threshold. Trim any bushes or trees on the path to your home. Use bright outdoor lighting. Clear any walking paths of anything that might make someone trip, such as rocks or tools. Regularly check to see if handrails are loose or broken. Make sure that both sides of any steps have handrails. Any raised decks and porches should have guardrails on the edges. Have any leaves, snow, or ice cleared regularly. Use sand or salt on walking paths during winter. Clean up any spills in your garage right away. This includes oil or grease spills. What can I do in the bathroom? Use night lights. Install grab bars by the toilet and in the tub and shower. Do not use towel bars as grab bars. Use non-skid mats or decals in the tub or shower. If you need to sit down in the shower, use a plastic, non-slip stool. Keep the floor dry. Clean up any water that spills on the floor as soon as it happens. Remove  soap buildup in the tub or shower regularly. Attach bath mats securely with double-sided non-slip rug tape. Do not have throw rugs and other things on the floor that can make you trip. What can I do in the bedroom? Use night lights. Make sure that you have a light by your bed that is easy to reach. Do not use any sheets or blankets that are too big for your bed. They should not hang down onto the floor. Have a firm chair that has side arms. You can use this for support while you get dressed. Do not have throw rugs and other things on the floor that can make you trip. What can I do in the kitchen? Clean up any spills right away. Avoid walking on wet floors. Keep items that you use a lot in easy-to-reach places. If you need to reach something above you, use a strong step stool that has a grab bar. Keep electrical cords out of the way. Do not use floor polish or wax that makes floors slippery. If you must use wax, use non-skid floor wax. Do not have throw rugs and other things on the floor that can make you trip. What can I do with my stairs? Do not leave any items on the stairs. Make sure that there are handrails on both sides of the stairs and use them. Fix handrails that are broken or loose. Make sure that handrails  are as long as the stairways. Check any carpeting to make sure that it is firmly attached to the stairs. Fix any carpet that is loose or worn. Avoid having throw rugs at the top or bottom of the stairs. If you do have throw rugs, attach them to the floor with carpet tape. Make sure that you have a light switch at the top of the stairs and the bottom of the stairs. If you do not have them, ask someone to add them for you. What else can I do to help prevent falls? Wear shoes that: Do not have high heels. Have rubber bottoms. Are comfortable and fit you well. Are closed at the toe. Do not wear sandals. If you use a stepladder: Make sure that it is fully opened. Do not climb a  closed stepladder. Make sure that both sides of the stepladder are locked into place. Ask someone to hold it for you, if possible. Clearly mark and make sure that you can see: Any grab bars or handrails. First and last steps. Where the edge of each step is. Use tools that help you move around (mobility aids) if they are needed. These include: Canes. Walkers. Scooters. Crutches. Turn on the lights when you go into a dark area. Replace any light bulbs as soon as they burn out. Set up your furniture so you have a clear path. Avoid moving your furniture around. If any of your floors are uneven, fix them. If there are any pets around you, be aware of where they are. Review your medicines with your doctor. Some medicines can make you feel dizzy. This can increase your chance of falling. Ask your doctor what other things that you can do to help prevent falls. This information is not intended to replace advice given to you by your health care provider. Make sure you discuss any questions you have with your health care provider. Document Released: 09/16/2009 Document Revised: 04/27/2016 Document Reviewed: 12/25/2014 Elsevier Interactive Patient Education  2017 [employer].

## 2021-10-24 NOTE — Progress Notes (Signed)
Virtual Visit via Telephone Note  I connected with  Paul Paul on 10/24/21 at  3:15 PM EST by telephone and verified that I am speaking with the correct person using two identifiers.  Medicare Annual Wellness visit completed telephonically due to Covid-19 pandemic.   Persons participating in this call: This Health Coach and this patient.   Location: Patient: Home Provider: Office   I discussed the limitations, risks, security and privacy concerns of performing an evaluation and management service by telephone and the availability of in person appointments. The patient expressed understanding and agreed to proceed.  Unable to perform video visit due to video visit attempted and failed and/or patient does not have video capability.   Some vital signs may be absent or patient reported.   Marzella Schlein, LPN   Subjective:   Paul Paul is a 65 y.o. male who presents for Medicare Annual/Subsequent preventive examination.  Review of Systems           Objective:    There were no vitals filed for this visit. There is no height or weight on file to calculate BMI.  Advanced Directives 10/24/2021 10/11/2020 10/02/2019 01/02/2019 02/13/2018 07/25/2013 11/29/2012  Does Patient Have a Medical Advance Directive? No No Yes No No Patient does not have advance directive Patient does not have advance directive  Type of Advance Directive - - Living will;Healthcare Power of Attorney - - - -  Does patient want to make changes to medical advance directive? - - No - Patient declined - - - -  Copy of Healthcare Power of Attorney in Chart? - - No - copy requested - - - -  Would patient like information on creating a medical advance directive? No - Patient declined No - Patient declined - No - Patient declined No - Patient declined - -  Pre-existing out of facility DNR order (yellow form or pink MOST form) - - - - - - -    Current Medications (verified) Outpatient Encounter Medications as of  10/24/2021  Medication Sig   aspirin EC 81 MG tablet Take 81 mg by mouth daily.   atorvastatin (LIPITOR) 80 MG tablet TAKE 1 TABLET EVERY DAY   glimepiride (AMARYL) 4 MG tablet TAKE 2 TABLETS  BY MOUTH DAILY BEFORE BREAKFAST.   nitroGLYCERIN (NITROSTAT) 0.4 MG SL tablet Place 1 tablet (0.4 mg total) under the tongue every 5 (five) minutes as needed for chest pain (up to 3 doses. call for help if doesnt resolve after 1 dose.).   [DISCONTINUED] amoxicillin-clavulanate (AUGMENTIN) 875-125 MG tablet Take 1 tablet by mouth 2 (two) times daily.   [DISCONTINUED] guaiFENesin-codeine 100-10 MG/5ML syrup Take 5 mLs by mouth 3 (three) times daily as needed for cough.   No facility-administered encounter medications on file as of 10/24/2021.    Allergies (verified) Doxycycline, Lisinopril, Losartan potassium-hctz, and Prednisone   History: Past Medical History:  Diagnosis Date   Anxiety    Arthritis    Bell palsy    > 30 years. Left side of face droops a little   Bradycardia    Cerebral aneurysm    Cerebral aneurysm without rupture 06/2011   Middle of Cerebral Artery   Complication of anesthesia 2012   after surgery for cliping of cerebral anerysum- In the room, patient was shaking, and patient would awaken and then "go back out" shaking continued after he "went back out:   Constipation    Diabetes mellitus    now under control  Dysrhythmia    BRADYCARDIA REQUIRING PACEMAKER .FOLLOWED BY GREGG TAYLOR   HYPERTENSION 12/10/2007   Pacemaker    Shoulder pain    L SHOULDER. RECENT FALL   Sleep apnea    CPAP.NOT WEARING NOW.NEEDS REPLACEMENT PIECE   Past Surgical History:  Procedure Laterality Date   CARDIAC CATHETERIZATION  07/06/11   normal   CEREBRAL ANEURYSM REPAIR  06/2011   "stenting and coiling" per patient   CORONARY STENT INTERVENTION N/A 07/21/2020   Procedure: CORONARY STENT INTERVENTION;  Surgeon: Iran Ouch, MD;  Location: [facility];  Service: Cardiovascular;   Laterality: N/A;   FRACTURE SURGERY Left    foot   INSERT / REPLACE / REMOVE PACEMAKER  2012   KNEE SURGERY Left    left; arthoscopic   LEFT HEART CATH AND CORONARY ANGIOGRAPHY N/A 07/21/2020   Procedure: LEFT HEART CATH AND CORONARY ANGIOGRAPHY;  Surgeon: Iran Ouch, MD;  Location: [facility];  Service: Cardiovascular;  Laterality: N/A;   rotator cuff surgery Left 02/2012   septal deviation     SHOULDER ARTHROSCOPY WITH SUBACROMIAL DECOMPRESSION, ROTATOR CUFF REPAIR AND BICEP TENDON REPAIR Right 08/24/2015   Procedure: RIGHT SHOULDER DIAGNOSTIC OPERATIVE ARTHROSCOPY WITH DEBRIDEMENT, SUBACROMIAL DECOMPRESSION, MIMI-OPEN ROTATOR CUFF TEAR REPAIR AND POSSIBLE  BICEPS TENODESIS.  ;  Surgeon: Cammy Copa, MD;  Location: [facility];  Service: Orthopedics;  Laterality: Right;  RIGHT SHOULDER DIAGNOSTIC OPERATIVE ARTHROSCOPY, DEBRIDEMENT, SUBACROMIAL DECOMPRESSION, MINI-OPEN ROTATOR CUFF TEAR REPAIR, POSSIB   Family History  Problem Relation Age of Onset   Heart attack Mother        died 29, smoker   Heart disease Mother    Diabetes Mother    Heart attack Father        died 20, smoker   Heart disease Father    Social History   Socioeconomic History   Marital status: Married    Spouse name: Not on file   Number of children: Not on file   Years of education: Not on file   Highest education level: Not on file  Occupational History   Occupation: Disabled  Tobacco Use   Smoking status: Former    Packs/day: 1.00    Years: 20.00    Pack years: 20.00    Types: Cigarettes    Quit date: 07/05/2011    Years since quitting: 10.3   Smokeless tobacco: Never  Vaping Use   Vaping Use: Never used  Substance and Sexual Activity   Alcohol use: No    Alcohol/week: 0.0 standard drinks   Drug use: No   Sexual activity: Not on file  Other Topics Concern   Not on file  Social History Narrative   Married over 30 years in 2015. 1 step son. 1 grandson.       Disabled from brain  aneurysm, pacemaker. Retired from [employer] years.       Hobbies: grandson, exercise at [facility]   Social Determinants of Health   Financial Resource Strain: Low Risk    Difficulty of Paying Living Expenses: Not hard at all  Food Insecurity: No Food Insecurity   Worried About Programme researcher, broadcasting/film/video in the Last Year: Never true   Barista in the Last Year: Never true  Transportation Needs: No Transportation Needs   Lack of Transportation (Medical): No   Lack of Transportation (Non-Medical): No  Physical Activity: Sufficiently Active   Days of Exercise per Week: 5 days   Minutes of Exercise  per Session: 60 min  Stress: No Stress Concern Present   Feeling of Stress : Not at all  Social Connections: Moderately Isolated   Frequency of Communication with Friends and Family: Once a week   Frequency of Social Gatherings with Friends and Family: Once a week   Attends Religious Services: 1 to 4 times per year   Active Member of [employer] or Organizations: No   Attends Engineer, structural: Never   Marital Status: Married    Tobacco Counseling Counseling given: Not Answered   Clinical Intake:  Pre-visit preparation completed: Yes  Pain : No/denies pain     BMI - recorded: 32.5 Nutritional Status: BMI > 30  Obese Nutritional Risks: None Diabetes: Yes CBG done?: Yes (125) CBG resulted in Enter/ Edit results?: No Did pt. bring in CBG monitor from home?: No  How often do you need to have someone help you when you read instructions, pamphlets, or other written materials from your doctor or pharmacy?: 1 - Never  Diabetic?Nutrition Risk Assessment:  Has the patient had any N/V/D within the last 2 months?  No  Does the patient have any non-healing wounds?  No  Has the patient had any unintentional weight loss or weight gain?  No   Diabetes:  Is the patient diabetic?  Yes  If diabetic, was a CBG obtained today?  Yes  Did the patient bring in their glucometer from  home?  No  How often do you monitor your CBG's? Twice  a week.   Financial Strains and Diabetes Management:  Are you having any financial strains with the device, your supplies or your medication? No .  Does the patient want to be seen by Chronic Care Management for management of their diabetes?  No  Would the patient like to be referred to a Nutritionist or for Diabetic Management?  No   Diabetic Exams:  Diabetic Eye Exam: Overdue for diabetic eye exam. Pt has been advised about the importance in completing this exam. Patient advised to call and schedule an eye exam. Diabetic Foot Exam: Completed 12/17/20   Interpreter Needed?: No  Information entered by :: Lanier Ensign, LPN   Activities of Daily Living In your present state of health, do you have any difficulty performing the following activities: 10/24/2021  Hearing? N  Vision? N  Difficulty concentrating or making decisions? N  Walking or climbing stairs? N  Dressing or bathing? N  Doing errands, shopping? N  Preparing Food and eating ? N  Using the Toilet? N  In the past six months, have you accidently leaked urine? N  Do you have problems with loss of bowel control? N  Managing your Medications? N  Managing your Finances? N  Housekeeping or managing your Housekeeping? N  Some recent data might be hidden    Patient Care Team: Shelva Majestic, MD as PCP - General (Family Medicine) Christell Constant, MD as PCP - Cardiology (Internal Medicine) Jethro Bolus, MD as Consulting Physician (Ophthalmology) Julieanne Cotton, MD as Consulting Physician (Interventional Radiology) Marinus Maw, MD as Consulting Physician (Cardiology)  Indicate any recent Medical Services you may have received from other than [facility] providers in the past year (date may be approximate).     Assessment:   This is a routine wellness examination for Sha.  Hearing/Vision screen Hearing Screening - Comments:: Pt denies any hearing  issues  Vision Screening - Comments:: Pt follows up with Dr Nile Riggs for eye exams   Dietary issues and  exercise activities discussed: Current Exercise Habits: Home exercise routine, Type of exercise: strength training/weights;walking;treadmill;stretching, Time (Minutes): 60, Frequency (Times/Week): 5, Weekly Exercise (Minutes/Week): 300   Goals Addressed             This Visit's Progress    Patient Stated       None at this time       Depression Screen PHQ 2/9 Scores 10/24/2021 10/11/2020 10/02/2019 06/10/2019 09/18/2018 07/12/2017  PHQ - 2 Score 0 0 0 0 0 0    Fall Risk Fall Risk  10/24/2021 10/11/2020 07/28/2020 10/02/2019 09/18/2018  Falls in the past year? 0 0 0 0 No  Number falls in past yr: 0 0 - - -  Injury with Fall? 0 0 0 0 -  Risk for fall due to : Impaired vision Impaired vision - - -  Follow up Falls prevention discussed Falls prevention discussed - Falls evaluation completed;Education provided;Falls prevention discussed -    FALL RISK PREVENTION PERTAINING TO THE HOME:  Any stairs in or around the home? Yes  If so, are there any without handrails? No  Home free of loose throw rugs in walkways, pet beds, electrical cords, etc? Yes  Adequate lighting in your home to reduce risk of falls? Yes   ASSISTIVE DEVICES UTILIZED TO PREVENT FALLS:  Life alert? No  Use of a cane, walker or w/c? No  Grab bars in the bathroom? No  Shower chair or bench in shower? No  Elevated toilet seat or a handicapped toilet? No   TIMED UP AND GO:  Was the test performed? No .  Cognitive Function: Pt declined 6CIT        Immunizations Immunization History  Administered Date(s) Administered   Fluad Quad(high Dose 65+) 10/02/2019, 09/28/2020   Influenza Split 11/06/2011, 09/05/2012   Influenza,inj,Quad PF,6+ Mos 09/24/2014, 09/18/2018   Janssen (J&J) SARS-COV-2 Vaccination 02/15/2020   Pneumococcal Conjugate-13 04/23/2018   Pneumococcal Polysaccharide-23 07/26/2012, 12/17/2020    Tdap 01/01/2012    TDAP status: Up to date  Flu Vaccine status: Due, Education has been provided regarding the importance of this vaccine. Advised may receive this vaccine at local pharmacy or Health Dept. Aware to provide a copy of the vaccination record if obtained from local pharmacy or Health Dept. Verbalized acceptance and understanding.  Pneumococcal vaccine status: Up to date  Covid-19 vaccine status: Completed vaccines  Qualifies for Shingles Vaccine? Yes   Zostavax completed No   Shingrix Completed?: No.    Education has been provided regarding the importance of this vaccine. Patient has been advised to call insurance company to determine out of pocket expense if they have not yet received this vaccine. Advised may also receive vaccine at local pharmacy or Health Dept. Verbalized acceptance and understanding.  Screening Tests Health Maintenance  Topic Date Due   Zoster Vaccines- Shingrix (1 of 2) Never done   OPHTHALMOLOGY EXAM  12/28/2017   COVID-19 Vaccine (2 - Booster for Janssen series) 04/11/2020   HEMOGLOBIN A1C  03/29/2021   INFLUENZA VACCINE  07/04/2021   URINE MICROALBUMIN  12/17/2021   FOOT EXAM  12/17/2021   TETANUS/TDAP  12/31/2021   Pneumonia Vaccine 82+ Years old  Completed   Hepatitis C Screening  Completed   HPV VACCINES  Aged Out   COLONOSCOPY (Pts 45-71yrs Insurance coverage will need to be confirmed)  Discontinued    Health Maintenance  Health Maintenance Due  Topic Date Due   Zoster Vaccines- Shingrix (1 of 2) Never done   OPHTHALMOLOGY  EXAM  12/28/2017   COVID-19 Vaccine (2 - Booster for Janssen series) 04/11/2020   HEMOGLOBIN A1C  03/29/2021   INFLUENZA VACCINE  07/04/2021   URINE MICROALBUMIN  12/17/2021    Colorectal cancer screening: No longer required. Discontinued   Additional Screening:  Hepatitis C Screening: Completed 05/03/18  Vision Screening: Recommended annual ophthalmology exams for early detection of glaucoma and other  disorders of the eye. Is the patient up to date with their annual eye exam?  No  Who is the provider or what is the name of the office in which the patient attends annual eye exams? Dr Nile Riggs If pt is not established with a provider, would they like to be referred to a provider to establish care? No .   Dental Screening: Recommended annual dental exams for proper oral hygiene  Community Resource Referral / Chronic Care Management: CRR required this visit?  No   CCM required this visit?  No      Plan:     I have personally reviewed and noted the following in the patient's chart:   Medical and social history Use of alcohol, tobacco or illicit drugs  Current medications and supplements including opioid prescriptions. Patient is not currently taking opioid prescriptions. Functional ability and status Nutritional status Physical activity Advanced directives List of other physicians Hospitalizations, surgeries, and ER visits in previous 12 months Vitals Screenings to include cognitive, depression, and falls Referrals and appointments  In addition, I have reviewed and discussed with patient certain preventive protocols, quality metrics, and best practice recommendations. A written personalized care plan for preventive services as well as general preventive health recommendations were provided to patient.     Marzella Schlein, LPN   [id number]   Nurse Notes: None

## 2021-11-14 ENCOUNTER — Other Ambulatory Visit: Payer: Self-pay | Admitting: Family Medicine

## 2021-12-22 ENCOUNTER — Encounter: Payer: Medicare HMO | Admitting: Student

## 2021-12-25 NOTE — Progress Notes (Signed)
Cardiology Office Note Date:  12/25/2021  Patient ID:  Paul Paul, Paul Paul 03-May-1956, MRN [id number] PCP:  Shelva Majestic, MD  Cardiologist:  Dr. Izora Ribas Electrophysiologist: Dr. Ladona Ridgel    Chief Complaint: annual viist  History of Present Illness: Paul Paul is a 66 y.o. male with history of HTN, obesity, cerebral aneurysm, DM, OSA (2/CPAP), symptomatic bradycardia w/PPM, CAD (MI, PCI to LAD Aug 2021)  He comes in today to be seen for Dr. Ladona Ridgel, last seen by him Nov 2021, doing well, post MI/PCI, no changes were made.  Following with Dr. Izora Ribas, last visit with him Aug 2022, very active, no cardiac/anginal symptoms  Behind on remotes  Appt reports pt c/o daytime fatigue  TODAY He continues to work at [employer], is quite active without CP, palpitations or exertional intolerances. Had gas a couple nights ago, not again, and no symptoms otherwise. No dizzy spells, near syncope or syncope. No SOB  In the last couple weeks he is tired though. As long as he is busy, active feels good, but if seated for any period of time could fall asleep. He reports compliance with his CPAP but has not had his machine checked or retested for titratin in 20 years or longer.  He does not do remotes, comes to the office only for pacer checks.    Device information Abbott dual chamber PPM implanted 07/05/2011   Past Medical History:  Diagnosis Date   Anxiety    Arthritis    Bell palsy    > 30 years. Left side of face droops a little   Bradycardia    Cerebral aneurysm    Cerebral aneurysm without rupture 06/2011   Middle of Cerebral Artery   Complication of anesthesia 2012   after surgery for cliping of cerebral anerysum- In the room, patient was shaking, and patient would awaken and then "go back out" shaking continued after he "went back out:   Constipation    Diabetes mellitus    now under control   Dysrhythmia    BRADYCARDIA REQUIRING PACEMAKER .FOLLOWED BY GREGG  TAYLOR   HYPERTENSION 12/10/2007   Pacemaker    Shoulder pain    L SHOULDER. RECENT FALL   Sleep apnea    CPAP.NOT WEARING NOW.NEEDS REPLACEMENT PIECE    Past Surgical History:  Procedure Laterality Date   CARDIAC CATHETERIZATION  07/06/11   normal   CEREBRAL ANEURYSM REPAIR  06/2011   "stenting and coiling" per patient   CORONARY STENT INTERVENTION N/A 07/21/2020   Procedure: CORONARY STENT INTERVENTION;  Surgeon: Iran Ouch, MD;  [facility]: [facility];  Service: Cardiovascular;  Laterality: N/A;   FRACTURE SURGERY Left    foot   INSERT / REPLACE / REMOVE PACEMAKER  2012   KNEE SURGERY Left    left; arthoscopic   LEFT HEART CATH AND CORONARY ANGIOGRAPHY N/A 07/21/2020   Procedure: LEFT HEART CATH AND CORONARY ANGIOGRAPHY;  Surgeon: Iran Ouch, MD;  Location: [facility];  Service: Cardiovascular;  Laterality: N/A;   rotator cuff surgery Left 02/2012   septal deviation     SHOULDER ARTHROSCOPY WITH SUBACROMIAL DECOMPRESSION, ROTATOR CUFF REPAIR AND BICEP TENDON REPAIR Right 08/24/2015   Procedure: RIGHT SHOULDER DIAGNOSTIC OPERATIVE ARTHROSCOPY WITH DEBRIDEMENT, SUBACROMIAL DECOMPRESSION, MIMI-OPEN ROTATOR CUFF TEAR REPAIR AND POSSIBLE  BICEPS TENODESIS.  ;  Surgeon: Cammy Copa, MD;  Location: [facility];  Service: Orthopedics;  Laterality: Right;  RIGHT SHOULDER DIAGNOSTIC OPERATIVE ARTHROSCOPY, DEBRIDEMENT, SUBACROMIAL DECOMPRESSION, MINI-OPEN  ROTATOR CUFF TEAR REPAIR, POSSIB    Current Outpatient Medications  Medication Sig Dispense Refill   aspirin EC 81 MG tablet Take 81 mg by mouth daily.     atorvastatin (LIPITOR) 80 MG tablet TAKE 1 TABLET EVERY DAY 90 tablet 2   glimepiride (AMARYL) 4 MG tablet TAKE 2 TABLETS  BY MOUTH DAILY BEFORE BREAKFAST. 180 tablet 1   nitroGLYCERIN (NITROSTAT) 0.4 MG SL tablet Place 1 tablet (0.4 mg total) under the tongue every 5 (five) minutes as needed for chest pain (up to 3 doses. call for help if doesnt resolve after 1  dose.). 30 tablet 3   No current facility-administered medications for this visit.    Allergies:   Doxycycline, Lisinopril, Losartan potassium-hctz, and Prednisone   Social History:  The patient  reports that he quit smoking about 10 years ago. His smoking use included cigarettes. He has a 20.00 pack-year smoking history. He has never used smokeless tobacco. He reports that he does not drink alcohol and does not use drugs.   Family History:  The patient's family history includes Diabetes in his mother; Heart attack in his father and mother; Heart disease in his father and mother.  ROS:  Please see the history of present illness.    All other systems are reviewed and otherwise negative.   PHYSICAL EXAM:  VS:  There were no vitals taken for this visit. BMI: There is no height or weight on file to calculate BMI. Well nourished, well developed, in no acute distress HEENT: normocephalic, atraumatic Neck: no JVD, carotid bruits or masses Cardiac:  RRR; no significant murmurs, no rubs, or gallops Lungs:  CTA b/l, no wheezing, rhonchi or rales Abd: soft, nontender MS: no deformity or atrophy Ext: no edema Skin: warm and dry, no rash Neuro:  No gross deficits appreciated Psych: euthymic mood, full affect  PPM site is stable, no tethering or discomfort   EKG:  not done today  Device interrogation done today and reviewed by myself:  Batter is nearing ERI, though not yet reached Lead measurements are good One AMS is a brief 1:1 tachycardia (1 minute 30seconds)     Transthoracic Echocardiogram: Date: 07/21/20 Results: 1. Left ventricular ejection fraction, by estimation, is 55 to 60%. The  left ventricle has normal function. The left ventricle has no regional  wall motion abnormalities. Left ventricular diastolic parameters were  normal.   2. Pacing wires in RA/RV. Right ventricular systolic function is normal.  The right ventricular size is normal.   3. The mitral valve is normal  in structure. No evidence of mitral valve  regurgitation. No evidence of mitral stenosis.   4. The aortic valve was not well visualized. Aortic valve regurgitation  is not visualized. No aortic stenosis is present.   5. The inferior vena cava is normal in size with greater than 50%  respiratory variability, suggesting right atrial pressure of 3 mmHg.    Left/Right Heart Catheterizations: Date: 07/21/2020 Results: 1st Diag lesion is 60% stenosed. Mid LAD lesion is 95% stenosed. Post intervention, there is a 5% residual stenosis. A drug-eluting stent was successfully placed using a STENT RESOLUTE ONYX 2.5X26. Dist LAD lesion is 40% stenosed.   1.  Severe one-vessel coronary artery disease with 95% moderately calcified stenosis in the mid LAD which seems to be the culprit for non-ST elevation myocardial infarction.  The RCA is heavily calcified with mild diffuse disease.   2.  Mildly elevated left ventricular end-diastolic pressure at 15 mmHg. 3.  Successful angioplasty and drug-eluting stent placement to the mid LAD.   Recommendations: Dual antiplatelet therapy for at least 1 year. Aggressive treatment of risk factors. Likely discharge home tomorrow if no issues.    Recent Labs: No results found for requested labs within last 8760 hours.  No results found for requested labs within last 8760 hours.   CrCl cannot be calculated (Patient's most recent lab result is older than the maximum 21 days allowed.).   Wt Readings from Last 3 Encounters:  10/06/21 260 lb (117.9 kg)  07/20/21 261 lb (118.4 kg)  02/21/21 264 lb 3.2 oz (119.8 kg)     Other studies reviewed: Additional studies/records reviewed today include: summarized above  ASSESSMENT AND PLAN:  PPM Nearing ERI Pt made aware Device check in clinic in 15mo  CAD No anginal symptoms On ASA, statin, no BB Deferred to Dr. Izora Ribas  HTN Looks good  HLD Labs today  Daytime fatigue OSA Recommend he have a new  sleep study/titration study, he declines, maybe after his pacer gen change when that happens Labs today    Disposition: F/u with device check in a month   Current medicines are reviewed at length with the patient today.  The patient did not have any concerns regarding medicines.  Norma Fredrickson, PA-C 12/25/2021 10:41 AM     [facility] [address] [place] [address] [phone] (office)  [phone] (fax)

## 2021-12-27 ENCOUNTER — Encounter: Payer: Self-pay | Admitting: Physician Assistant

## 2021-12-27 ENCOUNTER — Other Ambulatory Visit: Payer: Self-pay

## 2021-12-27 ENCOUNTER — Ambulatory Visit (INDEPENDENT_AMBULATORY_CARE_PROVIDER_SITE_OTHER): Payer: Medicare HMO | Admitting: Physician Assistant

## 2021-12-27 VITALS — BP 126/70 | HR 76 | Ht 75.0 in | Wt 260.0 lb

## 2021-12-27 DIAGNOSIS — Z79899 Other long term (current) drug therapy: Secondary | ICD-10-CM

## 2021-12-27 DIAGNOSIS — Z950 Presence of cardiac pacemaker: Secondary | ICD-10-CM | POA: Diagnosis not present

## 2021-12-27 DIAGNOSIS — G4730 Sleep apnea, unspecified: Secondary | ICD-10-CM

## 2021-12-27 DIAGNOSIS — E785 Hyperlipidemia, unspecified: Secondary | ICD-10-CM | POA: Diagnosis not present

## 2021-12-27 DIAGNOSIS — I2510 Atherosclerotic heart disease of native coronary artery without angina pectoris: Secondary | ICD-10-CM

## 2021-12-27 DIAGNOSIS — I10 Essential (primary) hypertension: Secondary | ICD-10-CM

## 2021-12-27 LAB — CUP PACEART INCLINIC DEVICE CHECK
Date Time Interrogation Session: 20230124173752
Implantable Lead Implant Date: 20120801
Implantable Lead Implant Date: 20120801
Implantable Lead Location: 753859
Implantable Lead Location: 753860
Implantable Pulse Generator Implant Date: 20120801
Lead Channel Pacing Threshold Amplitude: 1 V
Lead Channel Pacing Threshold Amplitude: 1.125 V
Lead Channel Pacing Threshold Pulse Width: 0.4 ms
Lead Channel Pacing Threshold Pulse Width: 0.4 ms
Lead Channel Sensing Intrinsic Amplitude: 11 mV
Lead Channel Sensing Intrinsic Amplitude: 5 mV
Pulse Gen Model: 2210
Pulse Gen Serial Number: 7250770

## 2021-12-27 NOTE — Patient Instructions (Addendum)
Medication Instructions:   Your physician recommends that you continue on your current medications as directed. Please refer to the Current Medication list given to you today.  *If you need a refill on your cardiac medications before your next appointment, please call your pharmacy*   Lab Work:  CMET AND CBC TODAY     If you have labs (blood work) drawn today and your tests are completely normal, you will receive your results only by: MyChart Message (if you have MyChart) OR A paper copy in the mail If you have any lab test that is abnormal or we need to change your treatment, we will call you to review the results.   Testing/Procedures: NONE ORDERED  TODAY    Follow-Up: At [facility], you and your health needs are our priority.  As part of our continuing mission to provide you with exceptional heart care, we have created designated Provider Care Teams.  These Care Teams include your primary Cardiologist (physician) and Advanced Practice Providers (APPs -  Physician Assistants and Nurse Practitioners) who all work together to provide you with the care you need, when you need it.  We recommend signing up for the patient portal called "MyChart".  Sign up information is provided on this After Visit Summary.  MyChart is used to connect with patients for Virtual Visits (Telemedicine).  Patients are able to view lab/test results, encounter notes, upcoming appointments, etc.  Non-urgent messages can be sent to your provider as well.   To learn more about what you can do with MyChart, go to [email or web address].    Your next appointment:   1 month(s)  The format for your next appointment:   In Person  Provider:   You may see Ladona Ridgel or one of the following Advanced Practice Providers on your designated Care Team:   Francis Dowse, [place] Casimiro Needle "Mardelle Matte" Lanna Poche, [place]   Other Instructions

## 2021-12-28 LAB — COMPREHENSIVE METABOLIC PANEL
ALT: 66 IU/L — ABNORMAL HIGH (ref 0–44)
AST: 66 IU/L — ABNORMAL HIGH (ref 0–40)
Albumin/Globulin Ratio: 1.1 — ABNORMAL LOW (ref 1.2–2.2)
Albumin: 4.1 g/dL (ref 3.8–4.8)
Alkaline Phosphatase: 117 IU/L (ref 44–121)
BUN/Creatinine Ratio: 15 (ref 10–24)
BUN: 14 mg/dL (ref 8–27)
Bilirubin Total: 0.8 mg/dL (ref 0.0–1.2)
CO2: 23 mmol/L (ref 20–29)
Calcium: 9.6 mg/dL (ref 8.6–10.2)
Chloride: 101 mmol/L (ref 96–106)
Creatinine, Ser: 0.95 mg/dL (ref 0.76–1.27)
Globulin, Total: 3.7 g/dL (ref 1.5–4.5)
Glucose: 396 mg/dL — ABNORMAL HIGH (ref 70–99)
Potassium: 4.2 mmol/L (ref 3.5–5.2)
Sodium: 136 mmol/L (ref 134–144)
Total Protein: 7.8 g/dL (ref 6.0–8.5)
eGFR: 88 mL/min/{1.73_m2} (ref >59)

## 2021-12-28 LAB — CBC
Hematocrit: 45.5 % (ref 37.5–51.0)
Hemoglobin: 15.4 g/dL (ref 13.0–17.7)
MCH: 30.7 pg (ref 26.6–33.0)
MCHC: 33.8 g/dL (ref 31.5–35.7)
MCV: 91 fL (ref 79–97)
Platelets: 168 10*3/uL (ref 150–450)
RBC: 5.01 x10E6/uL (ref 4.14–5.80)
RDW: 12.6 % (ref 11.6–15.4)
WBC: 4.9 10*3/uL (ref 3.4–10.8)

## 2022-01-11 NOTE — Progress Notes (Signed)
Phone: [phone]   Subjective:  Patient presents today for their annual physical. Chief complaint-noted.   See problem oriented charting- ROS- full  review of systems was completed and negative  except for: frequent urination- sugars running high  The following were reviewed and entered/updated in epic: Past Medical History:  Diagnosis Date   Anxiety    Arthritis    Bell palsy    > 30 years. Left side of face droops a little   Bradycardia    Cerebral aneurysm    Cerebral aneurysm without rupture 06/2011   Middle of Cerebral Artery   Complication of anesthesia 2012   after surgery for cliping of cerebral anerysum- In the room, patient was shaking, and patient would awaken and then "go back out" shaking continued after he "went back out:   Constipation    Diabetes mellitus    now under control   Dysrhythmia    BRADYCARDIA REQUIRING PACEMAKER .FOLLOWED BY GREGG B[place]   HYPERTENSION 12/10/2007   Pacemaker    Shoulder pain    L SHOULDER. RECENT FALL   Sleep apnea    CPAP.NOT WEARING NOW.NEEDS REPLACEMENT PIECE   Patient Active Problem List   Diagnosis Date Noted   CAD (coronary artery disease) status post drug-eluting stent mid LAD August 2021 07/28/2020    Priority: High   Sinus node dysfunction ([place]) 08/05/2019    Priority: High   Cerebral aneurysm without rupture 01/01/2012    Priority: High   Pacemaker 10/07/2011    Priority: High   Poorly controlled type 2 diabetes mellitus ([place]) 11/09/2008    Priority: High   Aortic atherosclerosis ([place]) 07/28/2020    Priority: Medium    Fatty liver 06/18/2018    Priority: Medium    Hepatotoxicity due to statin drug 10/19/2017    Priority: Medium    Elevated LFTs 08/09/2017    Priority: Medium    GAD (generalized anxiety disorder) 10/11/2016    Priority: Medium    OSA on CPAP 09/24/2014    Priority: Medium    Hyperlipidemia associated with type 2 diabetes mellitus ([place]) 09/24/2014    Priority: Medium    Other  specified cardiac dysrhythmias(427.89) 07/09/2012    Priority: Medium    Hypertension associated with diabetes ([place]) 12/10/2007    Priority: Medium    Obesity 01/01/2014    Priority: Low   Hyperlipidemia 10/19/2020   Chronic pruritic rash in adult 08/09/2017   Past Surgical History:  Procedure Laterality Date   CARDIAC CATHETERIZATION  07/06/11   normal   CEREBRAL ANEURYSM REPAIR  06/2011   "stenting and coiling" per patient   CORONARY STENT INTERVENTION N/A 07/21/2020   Procedure: CORONARY STENT INTERVENTION;  Surgeon: Wellington Hampshire, MD;  Location: [facility];  Service: Cardiovascular;  Laterality: N/A;   FRACTURE SURGERY Left    foot   INSERT / REPLACE / REMOVE PACEMAKER  2012   KNEE SURGERY Left    left; arthoscopic   LEFT HEART CATH AND CORONARY ANGIOGRAPHY N/A 07/21/2020   Procedure: LEFT HEART CATH AND CORONARY ANGIOGRAPHY;  Surgeon: Wellington Hampshire, MD;  Location: [facility];  Service: Cardiovascular;  Laterality: N/A;   rotator cuff surgery Left 02/2012   septal deviation     SHOULDER ARTHROSCOPY WITH SUBACROMIAL DECOMPRESSION, ROTATOR CUFF REPAIR AND BICEP TENDON REPAIR Right 08/24/2015   Procedure: RIGHT SHOULDER DIAGNOSTIC OPERATIVE ARTHROSCOPY WITH DEBRIDEMENT, SUBACROMIAL DECOMPRESSION, MIMI-OPEN ROTATOR CUFF TEAR REPAIR AND POSSIBLE  BICEPS TENODESIS.  ;  Surgeon: Nicki Reaper  Marcene Duos, MD;  Location: [facility];  Service: Orthopedics;  Laterality: Right;  RIGHT SHOULDER DIAGNOSTIC OPERATIVE ARTHROSCOPY, DEBRIDEMENT, SUBACROMIAL DECOMPRESSION, MINI-OPEN ROTATOR CUFF TEAR REPAIR, POSSIB    Family History  Problem Relation Age of Onset   Heart attack Mother        died 25, smoker   Heart disease Mother    Diabetes Mother    Heart attack Father        died 52, smoker   Heart disease Father     Medications- reviewed and updated Current Outpatient Medications  Medication Sig Dispense Refill   aspirin EC 81 MG tablet Take 81 mg by mouth daily.      atorvastatin (LIPITOR) 80 MG tablet TAKE 1 TABLET EVERY DAY 90 tablet 2   glimepiride (AMARYL) 4 MG tablet TAKE 2 TABLETS  BY MOUTH DAILY BEFORE BREAKFAST. 180 tablet 1   nitroGLYCERIN (NITROSTAT) 0.4 MG SL tablet Place 1 tablet (0.4 mg total) under the tongue every 5 (five) minutes as needed for chest pain (up to 3 doses. call for help if doesnt resolve after 1 dose.). 30 tablet 3   No current facility-administered medications for this visit.    Allergies-reviewed and updated Allergies  Allergen Reactions   Doxycycline Rash and Nausea Only   Lisinopril Rash   Losartan Potassium-Hctz Other (See Comments)    Caused dizziness & fatigue   Prednisone Other (See Comments)    Raises blood sugar    Social History   Social History Narrative   Married over 30 years in 2015. 1 step son. 1 grandson.       Disabled from brain aneurysm, pacemaker. Retired from [employer] years.       Hobbies: grandson, exercise at [facility]   Objective  Objective:  BP 122/70    Pulse 60    Temp 98.1 F (36.7 C)    Ht 6\' 3"  (1.905 m)    Wt 253 lb 9.6 oz (115 kg)    SpO2 96%    BMI 31.70 kg/m  Gen: NAD, resting comfortably HEENT: Mucous membranes are moist. Oropharynx normal Neck: no thyromegaly CV: RRR no murmurs rubs or gallops Lungs: CTAB no crackles, wheeze, rhonchi Abdomen: soft/nontender/nondistended/normal bowel sounds. No rebound or guarding.  Ext: no edema Skin: warm, dry Neuro: grossly normal, moves all extremities, PERRLA   Diabetic Foot Exam - Simple   Simple Foot Form Diabetic Foot exam was performed with the following findings: Yes 01/12/2022 11:24 AM  Visual Inspection No deformities, no ulcerations, no other skin breakdown bilaterally: Yes Sensation Testing Intact to touch and monofilament testing bilaterally: Yes Pulse Check Posterior Tibialis and Dorsalis pulse intact bilaterally: Yes Comments Stable loss of sensation left heel per baseline- prior crush injury        Assessment and Plan  66 y.o. male presenting for annual physical.  Health Maintenance counseling: 1. Anticipatory guidance: Patient counseled regarding regular dental exams - advised q6 months- needs to update, eye exams - needs to update- yearly,  avoiding smoking and second hand smoke, limiting alcohol to 2 beverages per day - doesn't drink.  No illicit drugs 2. Risk factor reduction:  Advised patient of need for regular exercise and diet rich and fruits and vegetables to reduce risk of heart attack and stroke.  Exercise- very limited right now- encouraged 5 days a week.  Diet/weight management-down slightly btu that could be due to poorly controlled diabetes- increased urination- suspect sugars high- encouraged to stay well hydrated.  Wt Readings from  Last 3 Encounters:  01/12/22 253 lb 9.6 oz (115 kg)  12/27/21 260 lb (117.9 kg)  10/06/21 260 lb (117.9 kg)  3. Immunizations/screenings/ancillary studies DISCUSSED:  -COVID booster vaccine #2- declines -Shingrix vaccine #1- pharmacy recommended -Flu vaccine (last one 09/2020) - today -TDAP vaccine - pharmacy recommended -Vision Exam - get records Immunization History  Administered Date(s) Administered   Fluad Quad(high Dose 65+) 10/02/2019, 09/28/2020   Influenza Split 11/06/2011, 09/05/2012   Influenza,inj,Quad PF,6+ Mos 09/24/2014, 09/18/2018   Janssen (J&J) SARS-COV-2 Vaccination 02/15/2020   Pneumococcal Conjugate-13 04/23/2018   Pneumococcal Polysaccharide-23 07/26/2012, 12/17/2020   Tdap 01/01/2012  4. Prostate cancer screening- no recent psa check- will update with labs today - declines rectal Lab Results  Component Value Date   PSA 0.40 11/01/2012   PSA 0.55 01/30/2007   5. Colon cancer screening - 02/25/07 - discontinued- he declines again today- wants to get pacemaker checked first. Declines cologuard 6. Skin cancer screening- no dermatologist. advised regular sunscreen use. Denies worrisome, changing, or new skin  lesions.  7. Smoking associated screening (lung cancer screening, AAA screen 65-75, UA)-  never smoker 8. STD screening - opts out  Status of chronic or acute concerns   #History of cerebral aneurysm without rupture. Follows with [place] radiology - in 2012 had 2 stents and coils.  Is to remain on aspirin. Recommended follow up with   Dr.Deveshwar with IR   #Diabetes mellitus-poor control. Metformin intolerant- GI. Declines relion 01/20/2019 and 10/02/2019 for 70/30 insulin S: compliant with glimepiride 4 mg twice daily.  Large swings in sugars in past as far as control with improved diet/exercise.  [id number] with cardiology, not checking lately encouraged to start Exercise and diet- plans to cut out ice cream and improve diet. Needs to restart his exercise.  Lab Results  Component Value Date   HGBA1C 8.7 (A) 09/28/2020   HGBA1C 10.4 (H) 07/20/2020   HGBA1C 9.4 (H) 10/02/2019   A/P: very concerned about a1c- with last blood sugar 399- update today - not interested in insulin, jardiance, ozempic- though we discussed not sure if they can change pacemaker out with sugar this high  #Hypertension S:  none  - Discontinued metoprolol 25 mg twice daily in the past BP Readings from Last 3 Encounters:  01/12/22 122/70  12/27/21 126/70  07/20/21 130/60  A/P:  Controlled. Continue without medications.    #Hyperlipidemia/elevated LFTs/aortic atherosclerosis S: Medication: Atorvastatin 80 mg every day -patient had been off of statin due to elevated LFTs in the past. History of hepatotoxicity due to statins. Interestingly LFTs improved when patient started atorvastatin 80 mg. Suspect fatty liver -Prior trials: We tried lovastatin 20mg  once a week but had stomach upset on this.  Lab Results  Component Value Date   CHOL 97 09/28/2020   HDL 38 (L) 09/28/2020   LDLCALC 39 09/28/2020   LDLDIRECT 104.0 08/09/2017   TRIG 115 09/28/2020   CHOLHDL 2.6 09/28/2020   A/P:  lipids well controled last check  with LDL goal under 70 with aortic atherosclerosis- update lipids today- likely continue current meds - LFTs mildly up last check but will monitor as long as remains ounder 100 Lab Results  Component Value Date   ALT 66 (H) 12/27/2021   AST 66 (H) 12/27/2021   ALKPHOS 117 12/27/2021   BILITOT 0.8 12/27/2021   #pacemaker due to history symptomatic bradycardia S: Patient followed with Dr. Lovena Le. He has a pacemaker due to history of symptomatic bradycardia A/P: upcoming replacement- really need to  get sugars down  #OSA/daytime fatigue- seen by cardiology 12/27/21 and they recommended updated sleep study- he declined - wants to see how he does with new pacemaker  Recommended follow up: No follow-ups on file. Future Appointments  Date Time Provider [place]  01/27/2022  3:35 PM Baldwin [place], PA-C [facility]   Lab/Order associations: fasting   ICD-10-CM   1. Preventative health care  Z00.00     2. Diabetes mellitus with coincident hypertension (HCC)  E11.9 Hemoglobin A1c   I10 Microalbumin / creatinine urine ratio    3. Hyperlipidemia associated with type 2 diabetes mellitus (HCC)  E11.69 Lipid panel   E78.5     4. Primary hypertension  I10     5. Pacemaker  Z95.0     6. Elevated LFTs  R79.89     7. Nocturia  R35.1 PSA    8. Aortic atherosclerosis (HCC) Chronic I70.0     9. Sinus node dysfunction (HCC) Chronic I49.5     10. Former smoker  Z87.891 POCT Urinalysis Dipstick (Automated)     No orders of the defined types were placed in this encounter.  I,Jada Bradford,acting as a scribe for Garret Reddish, MD.,have documented all relevant documentation on the behalf of Garret Reddish, MD,as directed by  Garret Reddish, MD while in the presence of Garret Reddish, MD.  I, Garret Reddish, MD, have reviewed all documentation for this visit. The documentation on 01/12/22 for the exam, diagnosis, procedures, and orders are all accurate and  complete.  Return precautions advised.  Garret Reddish, MD

## 2022-01-12 ENCOUNTER — Ambulatory Visit (INDEPENDENT_AMBULATORY_CARE_PROVIDER_SITE_OTHER): Payer: Medicare HMO | Admitting: Family Medicine

## 2022-01-12 ENCOUNTER — Encounter: Payer: Self-pay | Admitting: Family Medicine

## 2022-01-12 ENCOUNTER — Other Ambulatory Visit: Payer: Self-pay

## 2022-01-12 VITALS — BP 122/70 | HR 60 | Temp 98.1°F | Ht 75.0 in | Wt 253.6 lb

## 2022-01-12 DIAGNOSIS — R351 Nocturia: Secondary | ICD-10-CM | POA: Diagnosis not present

## 2022-01-12 DIAGNOSIS — E1169 Type 2 diabetes mellitus with other specified complication: Secondary | ICD-10-CM | POA: Diagnosis not present

## 2022-01-12 DIAGNOSIS — E119 Type 2 diabetes mellitus without complications: Secondary | ICD-10-CM

## 2022-01-12 DIAGNOSIS — I10 Essential (primary) hypertension: Secondary | ICD-10-CM | POA: Diagnosis not present

## 2022-01-12 DIAGNOSIS — Z0000 Encounter for general adult medical examination without abnormal findings: Secondary | ICD-10-CM

## 2022-01-12 DIAGNOSIS — R7989 Other specified abnormal findings of blood chemistry: Secondary | ICD-10-CM | POA: Diagnosis not present

## 2022-01-12 DIAGNOSIS — Z87891 Personal history of nicotine dependence: Secondary | ICD-10-CM | POA: Diagnosis not present

## 2022-01-12 DIAGNOSIS — Z950 Presence of cardiac pacemaker: Secondary | ICD-10-CM

## 2022-01-12 DIAGNOSIS — Z23 Encounter for immunization: Secondary | ICD-10-CM

## 2022-01-12 DIAGNOSIS — I495 Sick sinus syndrome: Secondary | ICD-10-CM

## 2022-01-12 DIAGNOSIS — E785 Hyperlipidemia, unspecified: Secondary | ICD-10-CM

## 2022-01-12 DIAGNOSIS — I700 Atherosclerosis of aorta: Secondary | ICD-10-CM

## 2022-01-12 LAB — PSA: PSA: 0.48 ng/mL (ref 0.10–4.00)

## 2022-01-12 LAB — LIPID PANEL
Cholesterol: 121 mg/dL (ref 0–200)
HDL: 45.2 mg/dL (ref >39.00)
LDL Cholesterol: 58 mg/dL (ref 0–99)
NonHDL: 76.17
Total CHOL/HDL Ratio: 3
Triglycerides: 93 mg/dL (ref 0.0–149.0)
VLDL: 18.6 mg/dL (ref 0.0–40.0)

## 2022-01-12 LAB — POC URINALSYSI DIPSTICK (AUTOMATED)
Bilirubin, UA: NEGATIVE
Blood, UA: NEGATIVE
Glucose, UA: POSITIVE — AB
Ketones, UA: NEGATIVE
Leukocytes, UA: NEGATIVE
Nitrite, UA: NEGATIVE
Protein, UA: NEGATIVE
Spec Grav, UA: 1.025 (ref 1.010–1.025)
Urobilinogen, UA: 0.2 E.U./dL
pH, UA: 5.5 (ref 5.0–8.0)

## 2022-01-12 LAB — HEMOGLOBIN A1C: Hgb A1c MFr Bld: 13.4 % — ABNORMAL HIGH (ref 4.6–6.5)

## 2022-01-12 LAB — MICROALBUMIN / CREATININE URINE RATIO
Creatinine,U: 126 mg/dL
Microalb Creat Ratio: 0.7 mg/g (ref 0.0–30.0)
Microalb, Ur: 0.8 mg/dL (ref 0.0–1.9)

## 2022-01-12 NOTE — Patient Instructions (Addendum)
Dont forget to find a new eye doctor for your eye exam.  Flu shot today- changed his mind  Recommend shingrix and Tdap at the pharmacy  Please stop by lab before you go If you have mychart- we will send your results within 3 business days of [place] receiving them.  If you do not have mychart- we will call you about results within 5 business days of [place] receiving them.  *please also note that you will see labs on mychart as soon as they post. I will later go in and write notes on them- will say "notes from Dr. Durene Cal"    Recommended follow up: Return in about 14 weeks (around 04/20/2022) for follow up- or sooner if needed.  VERY concerned about your blood sugar

## 2022-01-27 ENCOUNTER — Encounter: Payer: Self-pay | Admitting: *Deleted

## 2022-01-27 ENCOUNTER — Other Ambulatory Visit: Payer: Self-pay

## 2022-01-27 ENCOUNTER — Encounter: Payer: Self-pay | Admitting: Physician Assistant

## 2022-01-27 ENCOUNTER — Ambulatory Visit (INDEPENDENT_AMBULATORY_CARE_PROVIDER_SITE_OTHER): Payer: Medicare HMO | Admitting: Physician Assistant

## 2022-01-27 VITALS — BP 130/70 | HR 65 | Ht 75.0 in | Wt 257.0 lb

## 2022-01-27 DIAGNOSIS — I2510 Atherosclerotic heart disease of native coronary artery without angina pectoris: Secondary | ICD-10-CM | POA: Diagnosis not present

## 2022-01-27 DIAGNOSIS — I495 Sick sinus syndrome: Secondary | ICD-10-CM

## 2022-01-27 DIAGNOSIS — I10 Essential (primary) hypertension: Secondary | ICD-10-CM | POA: Diagnosis not present

## 2022-01-27 DIAGNOSIS — Z45010 Encounter for checking and testing of cardiac pacemaker pulse generator [battery]: Secondary | ICD-10-CM | POA: Diagnosis not present

## 2022-01-27 LAB — CUP PACEART INCLINIC DEVICE CHECK
Battery Remaining Longevity: 0 mo
Battery Voltage: 2.6 V
Brady Statistic RA Percent Paced: 46 %
Brady Statistic RV Percent Paced: 0.18 %
Date Time Interrogation Session: 20230224175348
Implantable Lead Implant Date: 20120801
Implantable Lead Implant Date: 20120801
Implantable Lead Location: 753859
Implantable Lead Location: 753860
Implantable Pulse Generator Implant Date: 20120801
Lead Channel Impedance Value: 487.5 Ohm
Lead Channel Impedance Value: 600 Ohm
Lead Channel Pacing Threshold Amplitude: 1 V
Lead Channel Pacing Threshold Amplitude: 1 V
Lead Channel Pacing Threshold Amplitude: 1 V
Lead Channel Pacing Threshold Pulse Width: 0.4 ms
Lead Channel Pacing Threshold Pulse Width: 0.4 ms
Lead Channel Pacing Threshold Pulse Width: 0.4 ms
Lead Channel Sensing Intrinsic Amplitude: 10.5 mV
Lead Channel Sensing Intrinsic Amplitude: 5 mV
Lead Channel Setting Pacing Amplitude: 1.25 V
Lead Channel Setting Pacing Amplitude: 2 V
Lead Channel Setting Pacing Pulse Width: 0.4 ms
Lead Channel Setting Sensing Sensitivity: 2 mV
Pulse Gen Model: 2210
Pulse Gen Serial Number: 7250770

## 2022-01-27 NOTE — Progress Notes (Signed)
Cardiology Office Note Date:  01/27/2022  Patient ID:  Paul Paul, [place] 08/29/1956, MRN [id number] PCP:  Marin Olp, MD  Cardiologist:  Dr. Gasper Sells Electrophysiologist: Dr. Lovena Le    Chief Complaint: battery check  History of Present Illness: Paul Paul is a 66 y.o. male with history of HTN, obesity, cerebral aneurysm, DM, OSA (2/CPAP), symptomatic bradycardia w/PPM, CAD (MI, PCI to LAD Aug 2021)  He comes in today to be seen for Dr. Lovena Le, last seen by him Nov 2021, doing well, post MI/PCI, no changes were made.  Following with Dr. Gasper Sells, last visit with him Aug 2022, very active, no cardiac/anginal symptoms  Behind on remotes  Appt reports pt c/o daytime fatigue  I saw him 12/27/21 He continues to work at [employer], is quite active without CP, palpitations or exertional intolerances. Had gas a couple nights ago, not again, and no symptoms otherwise. No dizzy spells, near syncope or syncope. No SOB In the last couple weeks he is tired though. As long as he is busy, active feels good, but if seated for any period of time could fall asleep. He reports compliance with his CPAP but has not had his machine checked or retested for titratin in 20 years or longer.  Recommended updated sleep study, he wanted to hold off Labs (BS 396, LDL was 58, trigs 93) Planned for a device check in a month He does not do remotes, comes to the office only for pacer checks.   Saw his PMD 01/12/22, DM poorly controlled, pt not interested in advancing management with efforts to diet and exercise, continued oral agents Hgb A1c was 13.4  TODAY He is accompanied by his wife He is doing well, still working denies any exertional intolerances or any new symptoms He repots the DM medications are all extremely expensive despite his insurance   Device information Abbott dual chamber PPM implanted 07/05/2011   Past Medical History:  Diagnosis Date   Anxiety    Arthritis    Bell  palsy    > 30 years. Left side of face droops a little   Bradycardia    Cerebral aneurysm    Cerebral aneurysm without rupture 06/2011   Middle of Cerebral Artery   Complication of anesthesia 2012   after surgery for cliping of cerebral anerysum- In the room, patient was shaking, and patient would awaken and then "go back out" shaking continued after he "went back out:   Constipation    Diabetes mellitus    now under control   Dysrhythmia    BRADYCARDIA REQUIRING PACEMAKER .FOLLOWED BY GREGG [place]   HYPERTENSION 12/10/2007   Pacemaker    Shoulder pain    L SHOULDER. RECENT FALL   Sleep apnea    CPAP.NOT WEARING NOW.NEEDS REPLACEMENT PIECE    Past Surgical History:  Procedure Laterality Date   CARDIAC CATHETERIZATION  07/06/11   normal   CEREBRAL ANEURYSM REPAIR  06/2011   "stenting and coiling" per patient   CORONARY STENT INTERVENTION N/A 07/21/2020   Procedure: CORONARY STENT INTERVENTION;  Surgeon: Wellington Hampshire, MD;  Location: [facility];  Service: Cardiovascular;  Laterality: N/A;   FRACTURE SURGERY Left    foot   INSERT / REPLACE / REMOVE PACEMAKER  2012   KNEE SURGERY Left    left; arthoscopic   LEFT HEART CATH AND CORONARY ANGIOGRAPHY N/A 07/21/2020   Procedure: LEFT HEART CATH AND CORONARY ANGIOGRAPHY;  Surgeon: Wellington Hampshire, MD;  Location: [facility]  INVASIVE [facility];  Service: Cardiovascular;  Laterality: N/A;   rotator cuff surgery Left 02/2012   septal deviation     SHOULDER ARTHROSCOPY WITH SUBACROMIAL DECOMPRESSION, ROTATOR CUFF REPAIR AND BICEP TENDON REPAIR Right 08/24/2015   Procedure: RIGHT SHOULDER DIAGNOSTIC OPERATIVE ARTHROSCOPY WITH DEBRIDEMENT, SUBACROMIAL DECOMPRESSION, MIMI-OPEN ROTATOR CUFF TEAR REPAIR AND POSSIBLE  BICEPS TENODESIS.  ;  Surgeon: Cammy Copa, MD;  Location: [facility];  Service: Orthopedics;  Laterality: Right;  RIGHT SHOULDER DIAGNOSTIC OPERATIVE ARTHROSCOPY, DEBRIDEMENT, SUBACROMIAL DECOMPRESSION, MINI-OPEN ROTATOR CUFF TEAR  REPAIR, POSSIB    Current Outpatient Medications  Medication Sig Dispense Refill   aspirin EC 81 MG tablet Take 81 mg by mouth daily.     atorvastatin (LIPITOR) 80 MG tablet TAKE 1 TABLET EVERY DAY 90 tablet 2   glimepiride (AMARYL) 4 MG tablet TAKE 2 TABLETS  BY MOUTH DAILY BEFORE BREAKFAST. 180 tablet 1   nitroGLYCERIN (NITROSTAT) 0.4 MG SL tablet Place 1 tablet (0.4 mg total) under the tongue every 5 (five) minutes as needed for chest pain (up to 3 doses. call for help if doesnt resolve after 1 dose.). 30 tablet 3   No current facility-administered medications for this visit.    Allergies:   Doxycycline, Lisinopril, Losartan potassium-hctz, and Prednisone   Social History:  The patient  reports that he quit smoking about 10 years ago. His smoking use included cigarettes. He has a 20.00 pack-year smoking history. He has never used smokeless tobacco. He reports that he does not drink alcohol and does not use drugs.   Family History:  The patient's family history includes Diabetes in his mother; Heart attack in his father and mother; Heart disease in his father and mother.  ROS:  Please see the history of present illness.    All other systems are reviewed and otherwise negative.   PHYSICAL EXAM:  VS:  There were no vitals taken for this visit. BMI: There is no height or weight on file to calculate BMI. Well nourished, well developed, in no acute distress HEENT: normocephalic, atraumatic Neck: no JVD, carotid bruits or masses Cardiac:  RRR; no significant murmurs, no rubs, or gallops Lungs:  CTA b/l, no wheezing, rhonchi or rales Abd: soft, nontender MS: no deformity or atrophy Ext: no edema Skin: warm and dry, no rash Neuro:  No gross deficits appreciated Psych: euthymic mood, full affect  PPM site is stable, no tethering or discomfort   EKG:  not done today  Device interrogation done today and reviewed by myself:  Battery voltage has reached ERI status Lead measurements  are good No arrhythmias      Transthoracic Echocardiogram: Date: 07/21/20 Results: 1. Left ventricular ejection fraction, by estimation, is 55 to 60%. The  left ventricle has normal function. The left ventricle has no regional  wall motion abnormalities. Left ventricular diastolic parameters were  normal.   2. Pacing wires in RA/RV. Right ventricular systolic function is normal.  The right ventricular size is normal.   3. The mitral valve is normal in structure. No evidence of mitral valve  regurgitation. No evidence of mitral stenosis.   4. The aortic valve was not well visualized. Aortic valve regurgitation  is not visualized. No aortic stenosis is present.   5. The inferior vena cava is normal in size with greater than 50%  respiratory variability, suggesting right atrial pressure of 3 mmHg.    Left/Right Heart Catheterizations: Date: 07/21/2020 Results: 1st Diag lesion is 60% stenosed. Mid LAD lesion is 95% stenosed.  Post intervention, there is a 5% residual stenosis. A drug-eluting stent was successfully placed using a STENT RESOLUTE ONYX 2.5X26. Dist LAD lesion is 40% stenosed.   1.  Severe one-vessel coronary artery disease with 95% moderately calcified stenosis in the mid LAD which seems to be the culprit for non-ST elevation myocardial infarction.  The RCA is heavily calcified with mild diffuse disease.   2.  Mildly elevated left ventricular end-diastolic pressure at 15 mmHg. 3.  Successful angioplasty and drug-eluting stent placement to the mid LAD.   Recommendations: Dual antiplatelet therapy for at least 1 year. Aggressive treatment of risk factors. Likely discharge home tomorrow if no issues.    Recent Labs: 12/27/2021: ALT 66; BUN 14; Creatinine, Ser 0.95; Hemoglobin 15.4; Platelets 168; Potassium 4.2; Sodium 136  01/12/2022: Cholesterol 121; HDL 45.20; LDL Cholesterol 58; Total CHOL/HDL Ratio 3; Triglycerides 93.0; VLDL 18.6   CrCl cannot be calculated  (Patient's most recent lab result is older than the maximum 21 days allowed.).   Wt Readings from Last 3 Encounters:  01/12/22 253 lb 9.6 oz (115 kg)  12/27/21 260 lb (117.9 kg)  10/06/21 260 lb (117.9 kg)     Other studies reviewed: Additional studies/records reviewed today include: summarized above  ASSESSMENT AND PLAN:  PPM ERI by voltage We discussed generator change procedure, potential risks, he is agreeable to proceed  We discussed his DM ooc and marked elevation in infection risk and have urged him to reach out to his PMD/work hard at getting his sugar better We have some time, plan to look towards a4 weeks for his procedure, hopefully his BS will be better  CAD No anginal symptoms On ASA, statin, no BB Deferred to Dr. Gasper Sells  HTN Looks good  HLD Not addressed today  Daytime fatigue OSA Recommend at his last visit that he have a new sleep study/titration study, he declines, maybe after his pacer gen change when that happens     Disposition: usual post gen change follow up   Current medicines are reviewed at length with the patient today.  The patient did not have any concerns regarding medicines.  Venetia Night, PA-C 01/27/2022 6:11 AM     [facility] [address] [place] [address] [phone] (office)  [phone] (fax)

## 2022-01-27 NOTE — Patient Instructions (Addendum)
Medication Instructions:   Your physician recommends that you continue on your current medications as directed. Please refer to the Current Medication list given to you today.   *If you need a refill on your cardiac medications before your next appointment, please call your pharmacy*   Lab Work: RETURN IN TWO WEEKS FOR BLOOD WORK   CBC AND BMET DO NOT FAST     If you have labs (blood work) drawn today and your tests are completely normal, you will receive your results only by: [place] (if you have MyChart) OR A paper copy in the mail If you have any lab test that is abnormal or we need to change your treatment, we will call you to review the results.   Testing/Procedures:  SEE LETTER FOR GENERATOR CHANGE OUR 02-23-22 WITH DR Lovena Le     Follow-Up: At [facility], you and your health needs are our priority.  As part of our continuing mission to provide you with exceptional heart care, we have created designated Provider Care Teams.  These Care Teams include your primary Cardiologist (physician) and Advanced Practice Providers (APPs -  Physician Assistants and Nurse Practitioners) who all work together to provide you with the care you need, when you need it.  We recommend signing up for the patient portal called "MyChart".  Sign up information is provided on this After Visit Summary.  MyChart is used to connect with patients for Virtual Visits (Telemedicine).  Patients are able to view lab/test results, encounter notes, upcoming appointments, etc.  Non-urgent messages can be sent to your provider as well.   To learn more about what you can do with MyChart, go to [email or web address].    Your next appointment:  AFTER 02-23-22 10-14 DAY DEVICE WOUND CHECK   3 month(s)  The format for your next appointment:   In Person  Provider:   Cristopher Peru, MD [facility] PACER [facility]    Other Instructions   PLEASE [place]

## 2022-01-30 ENCOUNTER — Telehealth: Payer: Self-pay | Admitting: Family Medicine

## 2022-01-30 NOTE — Telephone Encounter (Signed)
Yes, that will be fine.

## 2022-01-30 NOTE — Telephone Encounter (Signed)
Pt states he will be having surgery on 02/23/22 to replace pacemaker. He needs to schedule a visit with you to discuss some health concerns prior. Can I schedule him on a SD?

## 2022-02-01 ENCOUNTER — Telehealth: Payer: Medicare HMO | Admitting: *Deleted

## 2022-02-01 NOTE — Telephone Encounter (Signed)
-----   Message from [facility], [place] sent at 01/30/2022 11:48 AM EST ----- ?Let him know we will see how his sugar looks on his pre-op labs, if it is still very high, we may need to push his procedure out some until it improves.  Please have him follow up with his PMD and see if there are any patient assistance available for the other meds his PMD had suggested for his diabetes.  THANKS! ? ?

## 2022-02-01 NOTE — Telephone Encounter (Signed)
Spoke with patient aware of  recommendations if sugar levels are not under control before the procedure. Patient stated he follows  up with PMD this Friday  02-03-22 and believes he going to start him on a new medicine to help manage his blood sugar. Hopefully this will help manage levels by time lab work is drawn.Patient aware of EKG with a nurse on the same day as lab. Patient was told he'll be contact back once his lab results are in. ?

## 2022-02-03 ENCOUNTER — Encounter: Payer: Self-pay | Admitting: Family Medicine

## 2022-02-03 ENCOUNTER — Other Ambulatory Visit: Payer: Self-pay

## 2022-02-03 ENCOUNTER — Ambulatory Visit (INDEPENDENT_AMBULATORY_CARE_PROVIDER_SITE_OTHER): Payer: Medicare HMO | Admitting: Family Medicine

## 2022-02-03 VITALS — BP 144/70 | HR 64 | Temp 98.2°F | Ht 75.0 in | Wt 255.6 lb

## 2022-02-03 DIAGNOSIS — E1165 Type 2 diabetes mellitus with hyperglycemia: Secondary | ICD-10-CM | POA: Diagnosis not present

## 2022-02-03 DIAGNOSIS — E1169 Type 2 diabetes mellitus with other specified complication: Secondary | ICD-10-CM | POA: Diagnosis not present

## 2022-02-03 DIAGNOSIS — I10 Essential (primary) hypertension: Secondary | ICD-10-CM

## 2022-02-03 DIAGNOSIS — E785 Hyperlipidemia, unspecified: Secondary | ICD-10-CM | POA: Diagnosis not present

## 2022-02-03 MED ORDER — EMPAGLIFLOZIN 10 MG PO TABS: 10.0000 mg | ORAL_TABLET | Freq: Every day | ORAL | 5 refills | Status: DC

## 2022-02-03 NOTE — Progress Notes (Signed)
? ?Phone [phone] ?In person visit ?  ?Subjective:  ? ?Paul Paul is a 66 y.o. year old very pleasant male patient who presents for/with See problem oriented charting ?Chief Complaint  ?Patient presents with  ? questions regarding health  ? ? ?This visit occurred during the SARS-CoV-2 public health emergency.  Safety protocols were in place, including screening questions prior to the visit, additional usage of staff PPE, and extensive cleaning of exam room while observing appropriate contact time as indicated for disinfecting solutions.  ? ?Past Medical History-  ?Patient Active Problem List  ? Diagnosis Date Noted  ? CAD (coronary artery disease) status post drug-eluting stent mid LAD August 2021 07/28/2020  ?  Priority: High  ? Sinus node dysfunction (HCC) 08/05/2019  ?  Priority: High  ? Cerebral aneurysm without rupture 01/01/2012  ?  Priority: High  ? Pacemaker 10/07/2011  ?  Priority: High  ? Poorly controlled type 2 diabetes mellitus (HCC) 11/09/2008  ?  Priority: High  ? Aortic atherosclerosis (HCC) 07/28/2020  ?  Priority: Medium   ? Fatty liver 06/18/2018  ?  Priority: Medium   ? Hepatotoxicity due to statin drug 10/19/2017  ?  Priority: Medium   ? Elevated LFTs 08/09/2017  ?  Priority: Medium   ? GAD (generalized anxiety disorder) 10/11/2016  ?  Priority: Medium   ? OSA on CPAP 09/24/2014  ?  Priority: Medium   ? Hyperlipidemia associated with type 2 diabetes mellitus (HCC) 09/24/2014  ?  Priority: Medium   ? Other specified cardiac dysrhythmias(427.89) 07/09/2012  ?  Priority: Medium   ? Hypertension associated with diabetes (HCC) 12/10/2007  ?  Priority: Medium   ? Obesity 01/01/2014  ?  Priority: Low  ? Hyperlipidemia 10/19/2020  ? Chronic pruritic rash in adult 08/09/2017  ? ? ?Medications- reviewed and updated ?Current Outpatient Medications  ?Medication Sig Dispense Refill  ? aspirin EC 81 MG tablet Take 81 mg by mouth daily.    ? atorvastatin (LIPITOR) 80 MG tablet TAKE 1 TABLET EVERY DAY  90 tablet 2  ? empagliflozin (JARDIANCE) 10 MG TABS tablet Take 1 tablet (10 mg total) by mouth daily before breakfast. 30 tablet 5  ? glimepiride (AMARYL) 4 MG tablet TAKE 2 TABLETS  BY MOUTH DAILY BEFORE BREAKFAST. 180 tablet 1  ? nitroGLYCERIN (NITROSTAT) 0.4 MG SL tablet Place 1 tablet (0.4 mg total) under the tongue every 5 (five) minutes as needed for chest pain (up to 3 doses. call for help if doesnt resolve after 1 dose.). 30 tablet 3  ? ?No current facility-administered medications for this visit.  ? ?  ?Objective:  ?BP (!) 144/70   Pulse 64   Temp 98.2 ?F (36.8 ?C)   Ht 6\' 3"  (1.905 m)   Wt 255 lb 9.6 oz (115.9 kg)   SpO2 97%   BMI 31.95 kg/m?  ?Gen: NAD, resting comfortably ? ?  ? ?Assessment and Plan  ? ? ?#Diabetes mellitus-poor control. Metformin intolerant- GI. Declines relion 01/20/2019 and 10/02/2019 ?S: compliant with glimepiride 4 mg daily.  ?Large swings in sugars in past as far as control with improved diet/exercise.  ? ?2 months or less until must have replacement from his pacemaker. Cardiology can not change device out with sugars this high which I am in agreement with.  ? ?CBGs-  this morning was 250 down from nearly 500.  ?Exercise and diet- Wife has cut down on his poor food choices for him and carb counting- makes him  slightly irritable.  ?Lab Results  ?Component Value Date  ? HGBA1C 13.4 (H) 01/12/2022  ? HGBA1C 8.7 (A) 09/28/2020  ? HGBA1C 10.4 (H) 07/20/2020  ? ? A/P: Diabetes is very poorly controlled.  I recommended we start insulin.  He is strongly opposed to this for now.  He is agreeable to Jardiance trial at 45 mg-has a family member who is tolerating this.  I am thrilled his sugars have at least come down under 300 but I really want to get him under 180 before his procedureTo change out his pacemaker on March 23 and I still think he will require insulin-he is going to reach out to me on Tuesday ?-Continue glimepiride ?-Apparently he is getting his blood sugar checked next  Friday and procedure will be canceled if not improved ? ?#Hypertension ?S: medication: none. ? Discontinued metoprolol 25 mg BID  ?BP Readings from Last 3 Encounters:  ?02/03/22 (!) 144/70  ?01/27/22 130/70  ?01/12/22 122/70  ?A/P:  Controlled typically- likely mildly elevated today as patient is nervous about procedure and starting new diabetes medicine. Continue current medications- adjust only if continues to be elevated- plus jardiance may lower BP some ? ?#Hyperlipidemia/elevated LFTs ?S: Medication: Atorvastatin 80 mg  ? ?patient been off of statin due to elevated LFTs. History of hepatotoxicity due to statins. Interestingly LFTs improved when patient started atorvastatin 80 mg. ?-Prior trials: We tried lovastatin 20mg  once a week but had stomach upset on this.  ?Lab Results  ?Component Value Date  ? CHOL 121 01/12/2022  ? HDL 45.20 01/12/2022  ? Martin 58 01/12/2022  ? LDLDIRECT 104.0 08/09/2017  ? TRIG 93.0 01/12/2022  ? CHOLHDL 3 01/12/2022  ? A/P: Reasonable control but LFTs have been somewhat elevated-needs to work on healthy eating/regular exercise as I suspect fatty liver at play ? ? ?#History of cerebral aneurysm without rupture. Followed with [place] radiology - in 2012 had 2 stents and coils.  ?-Patient knows he needs to call to schedule follow-up ? ?#pacemaker due to history symptomatic bradycardia ?S: Patient followed with Dr. Lovena Le. He had a pacemaker due to history of symptomatic bradycardia ?A/P: See above but need to get sugars down to get this replaced-apparently he only has a few months left possibly even just 2 on the pacemaker ? ?Recommended follow up: need to know #s next week and need visit as per last note for regular follow up - had recommended 14 weeks from 01/12/22 ?Future Appointments  ?Date Time Provider [place]  ?02/10/2022  1:30 PM [facility]  ?02/10/2022  2:00 PM [facility] NURSE [facility]  ?03/09/2022  2:40 PM [facility] DEVICE 1  [facility]  ?06/02/2022  3:30 PM Evans Lance, MD [facility]  ? ?Lab/Order associations: ?  ICD-10-CM   ?1. Poorly controlled type 2 diabetes mellitus ([place])  E11.65   ?  ?2. Hyperlipidemia associated with type 2 diabetes mellitus ([place])  E11.69   ? E78.5   ?  ?3. Primary hypertension  I10   ?  ? ? ?Meds ordered this encounter  ?Medications  ? empagliflozin (JARDIANCE) 10 MG TABS tablet  ?  Sig: Take 1 tablet (10 mg total) by mouth daily before breakfast.  ?  Dispense:  30 tablet  ?  Refill:  5  ? ?I,Jada Bradford,acting as a scribe for Garret Reddish, MD.,have documented all relevant documentation on the behalf of Garret Reddish, MD,as directed by  Garret Reddish, MD while in the presence of Garret Reddish, MD. ? ?  I, Garret Reddish, MD, have reviewed all documentation for this visit. The documentation on 02/03/22 for the exam, diagnosis, procedures, and orders are all accurate and complete. ? ?Return precautions advised.  ?Garret Reddish, MD ? ? ?

## 2022-02-03 NOTE — Patient Instructions (Addendum)
Start jardiance 10 mg and update me on Tuesday. If this is not affordable let me know on Monday and we can look at insulin first.  ? ?Recommended follow up: early next week- we need to get sugar down quickly- insulin can help if needed- keep the diet clean!  ?

## 2022-02-07 ENCOUNTER — Telehealth: Payer: Self-pay | Admitting: Family Medicine

## 2022-02-07 NOTE — Telephone Encounter (Signed)
FYI

## 2022-02-07 NOTE — Telephone Encounter (Signed)
Started taking jardiance 10 mg. Patient stated on 3/5 at around 7am his sugar level was at 120. Today this morning his sugar level is at 110.- Stated it has dropped drastically. Patient stated he is feeling fine, he is not having side effects nor other symptoms.  ? ? ?

## 2022-02-07 NOTE — Telephone Encounter (Signed)
That is much improved-lets also have him checked 2 hours after his largest meal of the day to make sure remaining at least under 200-prefer under 180 ?

## 2022-02-08 NOTE — Telephone Encounter (Signed)
Called and spoke with pt and below message given, pt will update [place] next week with numbers. ?

## 2022-02-10 ENCOUNTER — Other Ambulatory Visit: Payer: Medicare HMO | Admitting: *Deleted

## 2022-02-10 ENCOUNTER — Other Ambulatory Visit: Payer: Self-pay

## 2022-02-10 ENCOUNTER — Ambulatory Visit (INDEPENDENT_AMBULATORY_CARE_PROVIDER_SITE_OTHER): Payer: Medicare HMO

## 2022-02-10 VITALS — HR 60 | Ht 75.0 in | Wt 253.8 lb

## 2022-02-10 DIAGNOSIS — I495 Sick sinus syndrome: Secondary | ICD-10-CM

## 2022-02-10 DIAGNOSIS — Z45010 Encounter for checking and testing of cardiac pacemaker pulse generator [battery]: Secondary | ICD-10-CM | POA: Diagnosis not present

## 2022-02-10 NOTE — Progress Notes (Signed)
? ?  Nurse Visit  ?  ?Date of Encounter: 02/10/2022 ?ID: Paul Paul, DOB 07/21/1956, MRN [id number] ? ?PCP:  Shelva Majestic, MD ?  ?Cardiologist: Riley Lam, MD  ?Advanced Practice Provider:   ?Electrophysiologist:  Sharrell Ku, MD  ?EP - Advanced Practice Provider:  Francis Dowse, PA ?Change PCP Type for MD, Specialty for APP is either Cardiology or Clinical Cardiac Electrophysiology  :[id number]} ? ? ?Visit Details  ? ?VS:  Ht 6\' 3"  (1.905 m)   Wt 253 lb 12.8 oz (115.1 kg)   BMI 31.72 kg/m?  , BMI Body mass index is 31.72 kg/m?. ? ?Wt Readings from Last 3 Encounters:  ?02/10/22 253 lb 12.8 oz (115.1 kg)  ?02/03/22 255 lb 9.6 oz (115.9 kg)  ?01/27/22 257 lb (116.6 kg)  ?  ? ?Reason for visit: EKG preop PPM generator change scheduled for 02/23/22 ?Performed today: EKG, Reviewed by 02/25/22, PA SR 1st AVB ?Changes (medications, testing, etc.) : No changes ?Length of Visit: 15 minutes ? ?Medications Adjustments/Labs and Tests Ordered: ?Orders Placed This Encounter  ?Procedures  ? EKG 12-Lead  ? ?No orders of the defined types were placed in this encounter. ? ? ?Signed, ?Francis Dowse, RN  ?02/10/2022 2:05 PM ? ? ?  ?

## 2022-02-10 NOTE — Patient Instructions (Signed)
Medication Instructions:  Your physician recommends that you continue on your current medications as directed. Please refer to the Current Medication list given to you today.  *If you need a refill on your cardiac medications before your next appointment, please call your pharmacy*   Lab Work: NONE If you have labs (blood work) drawn today and your tests are completely normal, you will receive your results only by: MyChart Message (if you have MyChart) OR A paper copy in the mail If you have any lab test that is abnormal or we need to change your treatment, we will call you to review the results.   Testing/Procedures: NONE   Follow-Up: As scheduled  At [facility], you and your health needs are our priority.  As part of our continuing mission to provide you with exceptional heart care, we have created designated Provider Care Teams.  These Care Teams include your primary Cardiologist (physician) and Advanced Practice Providers (APPs -  Physician Assistants and Nurse Practitioners) who all work together to provide you with the care you need, when you need it.   

## 2022-02-11 LAB — BASIC METABOLIC PANEL
BUN/Creatinine Ratio: 23 (ref 10–24)
BUN: 19 mg/dL (ref 8–27)
CO2: 19 mmol/L — ABNORMAL LOW (ref 20–29)
Calcium: 9.5 mg/dL (ref 8.6–10.2)
Chloride: 103 mmol/L (ref 96–106)
Creatinine, Ser: 0.83 mg/dL (ref 0.76–1.27)
Glucose: 111 mg/dL — ABNORMAL HIGH (ref 70–99)
Potassium: 4.2 mmol/L (ref 3.5–5.2)
Sodium: 138 mmol/L (ref 134–144)
eGFR: 97 mL/min/{1.73_m2} (ref >59)

## 2022-02-11 LAB — CBC
Hematocrit: 45.8 % (ref 37.5–51.0)
Hemoglobin: 15.8 g/dL (ref 13.0–17.7)
MCH: 30.6 pg (ref 26.6–33.0)
MCHC: 34.5 g/dL (ref 31.5–35.7)
MCV: 89 fL (ref 79–97)
Platelets: 170 10*3/uL (ref 150–450)
RBC: 5.17 x10E6/uL (ref 4.14–5.80)
RDW: 12.5 % (ref 11.6–15.4)
WBC: 5 10*3/uL (ref 3.4–10.8)

## 2022-02-15 ENCOUNTER — Telehealth: Payer: Self-pay | Admitting: *Deleted

## 2022-02-15 NOTE — Telephone Encounter (Signed)
-----   Message from Sheilah Pigeon, [place] sent at 02/13/2022  3:17 PM EDT ----- ?Glucose is markedly better!  Great job! ?We can proceed as planned with gen change ?

## 2022-02-15 NOTE — Telephone Encounter (Signed)
Spoke with patient about results and patient verbalized understanding. Patient and wife has concerns about device generator change and process. Patient  and wife is having  some doubts with the process and how the disconnecting of the old battery to the new and how does he stay alive though the process. Patient was told he would be contacted by the appropriate department individual to answer all their questions appropriately ?

## 2022-02-22 NOTE — Pre-Procedure Instructions (Signed)
Instructed patient on the following items: Arrival time 0730 Nothing to eat or drink after midnight No meds AM of procedure Responsible person to drive you home and stay with you for 24 hrs Wash with special soap night before and morning of procedure  

## 2022-02-23 ENCOUNTER — Ambulatory Visit (HOSPITAL_COMMUNITY)
Admission: RE | Admit: 2022-02-23 | Discharge: 2022-02-23 | Disposition: A | Discharge status: Home / Self Care | Payer: Medicare HMO | Attending: Internal Medicine | Admitting: Internal Medicine

## 2022-02-23 ENCOUNTER — Other Ambulatory Visit: Payer: Self-pay

## 2022-02-23 ENCOUNTER — Encounter (HOSPITAL_COMMUNITY)
Admission: RE | Disposition: A | Discharge status: Home / Self Care | Payer: Self-pay | Source: Home / Self Care | Attending: Internal Medicine

## 2022-02-23 DIAGNOSIS — Z79899 Other long term (current) drug therapy: Secondary | ICD-10-CM | POA: Diagnosis not present

## 2022-02-23 DIAGNOSIS — I10 Essential (primary) hypertension: Secondary | ICD-10-CM | POA: Insufficient documentation

## 2022-02-23 DIAGNOSIS — Z45010 Encounter for checking and testing of cardiac pacemaker pulse generator [battery]: Secondary | ICD-10-CM | POA: Diagnosis not present

## 2022-02-23 DIAGNOSIS — Z6830 Body mass index (BMI) 30.0-30.9, adult: Secondary | ICD-10-CM | POA: Diagnosis not present

## 2022-02-23 DIAGNOSIS — I2510 Atherosclerotic heart disease of native coronary artery without angina pectoris: Secondary | ICD-10-CM | POA: Insufficient documentation

## 2022-02-23 DIAGNOSIS — Z7982 Long term (current) use of aspirin: Secondary | ICD-10-CM | POA: Insufficient documentation

## 2022-02-23 DIAGNOSIS — G4730 Sleep apnea, unspecified: Secondary | ICD-10-CM | POA: Insufficient documentation

## 2022-02-23 DIAGNOSIS — I671 Cerebral aneurysm, nonruptured: Secondary | ICD-10-CM | POA: Diagnosis not present

## 2022-02-23 DIAGNOSIS — Z7984 Long term (current) use of oral hypoglycemic drugs: Secondary | ICD-10-CM | POA: Insufficient documentation

## 2022-02-23 DIAGNOSIS — Z87891 Personal history of nicotine dependence: Secondary | ICD-10-CM | POA: Diagnosis not present

## 2022-02-23 DIAGNOSIS — I495 Sick sinus syndrome: Secondary | ICD-10-CM | POA: Diagnosis not present

## 2022-02-23 DIAGNOSIS — E119 Type 2 diabetes mellitus without complications: Secondary | ICD-10-CM | POA: Insufficient documentation

## 2022-02-23 DIAGNOSIS — E669 Obesity, unspecified: Secondary | ICD-10-CM | POA: Diagnosis not present

## 2022-02-23 DIAGNOSIS — I252 Old myocardial infarction: Secondary | ICD-10-CM | POA: Insufficient documentation

## 2022-02-23 HISTORY — PX: PPM GENERATOR CHANGEOUT: EP1233

## 2022-02-23 LAB — GLUCOSE, CAPILLARY: Glucose-Capillary: 152 mg/dL — ABNORMAL HIGH (ref 70–99)

## 2022-02-23 SURGERY — PPM GENERATOR CHANGEOUT

## 2022-02-23 MED ORDER — LIDOCAINE HCL (PF) 1 % IJ SOLN
INTRAMUSCULAR | Status: AC
  Filled 2022-02-23: qty 30

## 2022-02-23 MED ORDER — POVIDONE-IODINE 10 % EX SWAB
2.0000 "application " | Freq: Once | CUTANEOUS | Status: AC
  Administered 2022-02-23: 2 via TOPICAL

## 2022-02-23 MED ORDER — CEFAZOLIN SODIUM-DEXTROSE 2-4 GM/100ML-% IV SOLN
2.0000 g | INTRAVENOUS | Status: AC
  Administered 2022-02-23: 2 g via INTRAVENOUS

## 2022-02-23 MED ORDER — FENTANYL CITRATE (PF) 100 MCG/2ML IJ SOLN
INTRAMUSCULAR | Status: AC
  Filled 2022-02-23: qty 2

## 2022-02-23 MED ORDER — CEFAZOLIN SODIUM-DEXTROSE 2-4 GM/100ML-% IV SOLN
INTRAVENOUS | Status: AC
  Filled 2022-02-23: qty 100

## 2022-02-23 MED ORDER — CHLORHEXIDINE GLUCONATE 4 % EX LIQD
4.0000 "application " | Freq: Once | CUTANEOUS | Status: DC
  Filled 2022-02-23: qty 60

## 2022-02-23 MED ORDER — ACETAMINOPHEN 325 MG PO TABS: 325.0000 mg | ORAL_TABLET | ORAL | Status: DC | PRN

## 2022-02-23 MED ORDER — MIDAZOLAM HCL 5 MG/5ML IJ SOLN
INTRAMUSCULAR | Status: DC | PRN
  Administered 2022-02-23: 2 mg via INTRAVENOUS

## 2022-02-23 MED ORDER — SODIUM CHLORIDE 0.9 % IV SOLN
INTRAVENOUS | Status: AC
  Filled 2022-02-23: qty 2

## 2022-02-23 MED ORDER — SODIUM CHLORIDE 0.9 % IV SOLN: INTRAVENOUS | Status: DC

## 2022-02-23 MED ORDER — SODIUM CHLORIDE 0.9 % IV SOLN
80.0000 mg | INTRAVENOUS | Status: AC
  Administered 2022-02-23: 80 mg

## 2022-02-23 MED ORDER — LIDOCAINE HCL (PF) 1 % IJ SOLN
INTRAMUSCULAR | Status: DC | PRN
  Administered 2022-02-23: 60 mL

## 2022-02-23 MED ORDER — FENTANYL CITRATE (PF) 100 MCG/2ML IJ SOLN
INTRAMUSCULAR | Status: DC | PRN
Start: 2022-02-23 — End: 2022-02-23
  Administered 2022-02-23: 25 ug via INTRAVENOUS

## 2022-02-23 MED ORDER — ONDANSETRON HCL 4 MG/2ML IJ SOLN: 4.0000 mg | Freq: Four times a day (QID) | INTRAMUSCULAR | Status: DC | PRN

## 2022-02-23 MED ORDER — MIDAZOLAM HCL 5 MG/5ML IJ SOLN
INTRAMUSCULAR | Status: AC
  Filled 2022-02-23: qty 5

## 2022-02-23 SURGICAL SUPPLY — 4 items
CABLE SURGICAL S-101-97-12 (CABLE) ×2 IMPLANT
PACEMAKER ASSURITY DR-RF (Pacemaker) ×1 IMPLANT
PAD DEFIB RADIO PHYSIO CONN (PAD) ×2 IMPLANT
TRAY PACEMAKER INSERTION (PACKS) ×2 IMPLANT

## 2022-02-23 NOTE — H&P (Signed)
?  ?  ?  ?HPI ?[name] returns today for followup. He is a pleasant 66yo man with symptomatic bradycardia, s/p PPM, HTN, obesity and a cerebral aneurysm, status post treatment.  He denies chest pain or sob. He has been under some increased stress and sustained an anterior MI several weeks ago. He is s/p PCI. He denies chest pain or sob. No syncope.  ?     ?Allergies  ?Allergen Reactions  ? Doxycycline Rash and Nausea Only  ? Lisinopril Rash  ? Losartan Potassium-Hctz Other (See Comments)  ?    Caused dizziness & fatigue  ? Prednisone Other (See Comments)  ?    Raises blood sugar  ?  ?  ?  ?      ?Current Outpatient Medications  ?Medication Sig Dispense Refill  ? aspirin EC 81 MG tablet Take 81 mg by mouth daily.      ? atorvastatin (LIPITOR) 80 MG tablet Take 1 tablet (80 mg total) by mouth daily. 90 tablet 3  ? glimepiride (AMARYL) 4 MG tablet TAKE 2 TABLETS  BY MOUTH DAILY BEFORE BREAKFAST. 180 tablet 1  ? metoprolol tartrate (LOPRESSOR) 25 MG tablet Take 1 tablet (25 mg total) by mouth 2 (two) times daily. 180 tablet 3  ? nitroGLYCERIN (NITROSTAT) 0.4 MG SL tablet Place 1 tablet (0.4 mg total) under the tongue every 5 (five) minutes as needed for chest pain (up to 3 doses. call for help if doesnt resolve after 1 dose.). 30 tablet 3  ? ticagrelor (BRILINTA) 90 MG TABS tablet Take 1 tablet (90 mg total) by mouth 2 (two) times daily. 180 tablet 3  ?  ?No current facility-administered medications for this visit.  ?  ?  ?  ?    ?Past Medical History:  ?Diagnosis Date  ? Anxiety    ? Arthritis    ? Bell palsy    ?  > 30 years. Left side of face droops a little  ? Bradycardia    ? Cerebral aneurysm    ? Cerebral aneurysm without rupture 06/2011  ?  Middle of Cerebral Artery  ? Complication of anesthesia 2012  ?  after surgery for cliping of cerebral anerysum- In the room, patient was shaking, and patient would awaken and then "go back out" shaking continued after he "went back out:  ? Constipation    ? Diabetes  mellitus    ?  now under control  ? Dysrhythmia    ?  BRADYCARDIA REQUIRING PACEMAKER .FOLLOWED BY Tifanie Gardiner  ? HYPERTENSION 12/10/2007  ? Pacemaker    ? Shoulder pain    ?  L SHOULDER. RECENT FALL  ? Sleep apnea    ?  CPAP.NOT WEARING NOW.NEEDS REPLACEMENT PIECE  ?  ?  ?ROS: ?  ? All systems reviewed and negative except as noted in the HPI. ?  ?  ?     ?Past Surgical History:  ?Procedure Laterality Date  ? CARDIAC CATHETERIZATION   07/06/11  ?  normal  ? CEREBRAL ANEURYSM REPAIR   06/2011  ?  "stenting and coiling" per patient  ? CORONARY STENT INTERVENTION N/A 07/21/2020  ?  Procedure: CORONARY STENT INTERVENTION;  Surgeon: Iran Ouch, MD;  Location: [facility] INVASIVE [facility] LAB;  Service: Cardiovascular;  Laterality: N/A;  ? FRACTURE SURGERY Left    ?  foot  ? INSERT / REPLACE / REMOVE PACEMAKER   2012  ? KNEE SURGERY Left    ?  left; arthoscopic  ?  LEFT HEART CATH AND CORONARY ANGIOGRAPHY N/A 07/21/2020  ?  Procedure: LEFT HEART CATH AND CORONARY ANGIOGRAPHY;  Surgeon: Iran Ouch, MD;  Location: [facility] INVASIVE CV LAB;  Service: Cardiovascular;  Laterality: N/A;  ? rotator cuff surgery Left 02/2012  ? septal deviation      ? SHOULDER ARTHROSCOPY WITH SUBACROMIAL DECOMPRESSION, ROTATOR CUFF REPAIR AND BICEP TENDON REPAIR Right 08/24/2015  ?  Procedure: RIGHT SHOULDER DIAGNOSTIC OPERATIVE ARTHROSCOPY WITH DEBRIDEMENT, SUBACROMIAL DECOMPRESSION, MIMI-OPEN ROTATOR CUFF TEAR REPAIR AND POSSIBLE  BICEPS TENODESIS.  ;  Surgeon: Cammy Copa, MD;  Location: [facility] OR;  Service: Orthopedics;  Laterality: Right;  RIGHT SHOULDER DIAGNOSTIC OPERATIVE ARTHROSCOPY, DEBRIDEMENT, SUBACROMIAL DECOMPRESSION, MINI-OPEN ROTATOR CUFF TEAR REPAIR, POSSIB  ?  ?  ?  ?     ?Family History  ?Problem Relation Age of Onset  ? Heart attack Mother    ?      died 74, smoker  ? Heart disease Mother    ? Diabetes Mother    ? Heart attack Father    ?      died 38, smoker  ? Heart disease Father    ?  ?  ?  ?Social History  ?  ?     ?Socioeconomic  History  ? Marital status: Married  ?    Spouse name: Not on file  ? Number of children: Not on file  ? Years of education: Not on file  ? Highest education level: Not on file  ?Occupational History  ? Not on file  ?Tobacco Use  ? Smoking status: Former Smoker  ?    Packs/day: 1.00  ?    Years: 20.00  ?    Pack years: 20.00  ?    Types: Cigarettes  ?    Quit date: 07/05/2011  ?    Years since quitting: 9.2  ? Smokeless tobacco: Never Used  ?Vaping Use  ? Vaping Use: Never used  ?Substance and Sexual Activity  ? Alcohol use: No  ?    Alcohol/week: 0.0 standard drinks  ? Drug use: No  ? Sexual activity: Not on file  ?Other Topics Concern  ? Not on file  ?Social History Narrative  ?  Married over 30 years in 2015. 1 step son. 1 grandson.   ?     ?  Disabled from brain aneurysm, pacemaker. Retired from [employer] years.   ?     ?  Hobbies: grandson, exercise at [facility]  ?  ?Social Determinants of Health  ?  ?   ?Financial Resource Strain:   ? Difficulty of Paying Living Expenses: Not on file  ?Food Insecurity:   ? Worried About Programme researcher, broadcasting/film/video in the Last Year: Not on file  ? Ran Out of Food in the Last Year: Not on file  ?Transportation Needs:   ? Freight forwarder (Medical): Not on file  ? Lack of Transportation (Non-Medical): Not on file  ?Physical Activity:   ? Days of Exercise per Week: Not on file  ? Minutes of Exercise per Session: Not on file  ?Stress:   ? Feeling of Stress : Not on file  ?Social Connections:   ? Frequency of Communication with Friends and Family: Not on file  ? Frequency of Social Gatherings with Friends and Family: Not on file  ? Attends Religious Services: Not on file  ? Active Member of Clubs or Organizations: Not on file  ? Attends Banker Meetings: Not on file  ?  Marital Status: Not on file  ?Intimate Partner Violence:   ? Fear of Current or Ex-Partner: Not on file  ? Emotionally Abused: Not on file  ? Physically Abused: Not on file  ? Sexually Abused: Not on file   ?  ?  ?  ?BP 126/72   Pulse 60   Ht 6\' 3"  (1.905 m)   Wt 266 lb 3.2 oz (120.7 kg)   SpO2 95%   BMI 33.27 kg/m?  ?  ?Physical Exam: ?  ?Well appearing NAD ?HEENT: Unremarkable ?Neck:  No JVD, no thyromegally ?Lymphatics:  No adenopathy ?Back:  No CVA tenderness ?Lungs:  Clear with no wheezes ?HEART:  Regular rate rhythm, no murmurs, no rubs, no clicks ?Abd:  soft, positive bowel sounds, no organomegally, no rebound, no guarding ?Ext:  2 plus pulses, no edema, no cyanosis, no clubbing ?Skin:  No rashes no nodules ?Neuro:  CN II through XII intact, motor grossly intact ?  ?DEVICE  ?Normal device function.  See PaceArt for details.  ?  ?Assess/Plan: ?1. Sinus node dysfunction - he is s/p PPM insertion and has reached ERI. He will undergo PPM gen change out. I have reviewed the indications/risks/benefits/ and he is willing to proceed. ?2. CAD - he is s/p recent anterior MI. He denies anginal symptoms and feels well. ?3. Obesity - his weight is neither up or down.  ? ?  ?Sharlot GowdaGregg Juandedios Dudash,MD ?

## 2022-02-23 NOTE — Progress Notes (Signed)
Per Dr. Ladona Ridgel, okay for patient to be discharged. Pressure dressing removed and site assessed. No bleeding or swelling noted. Patient stable.  ?

## 2022-02-23 NOTE — Discharge Instructions (Signed)

## 2022-02-24 ENCOUNTER — Encounter (HOSPITAL_COMMUNITY): Payer: Self-pay | Admitting: Internal Medicine

## 2022-02-27 ENCOUNTER — Telehealth: Payer: Self-pay | Admitting: Internal Medicine

## 2022-02-27 NOTE — Telephone Encounter (Signed)
Patient states his bike he uses is an elliptical bike that has a back support. States he does not use his arms while on the bike. Advised patient that would be fine, just advised not to use his arms at this time. Voiced understanding. ? ?Patient is aware of lifting restrictions of 10 lbs. ?

## 2022-02-27 NOTE — Telephone Encounter (Signed)
Patient had his pacemaker replaced last week Thursday.  He wants to know if it's okay for him to ride his stationary bike.   ?

## 2022-03-01 ENCOUNTER — Telehealth: Payer: Self-pay

## 2022-03-01 MED ORDER — EMPAGLIFLOZIN 10 MG PO TABS: 10.0000 mg | ORAL_TABLET | Freq: Every day | ORAL | 5 refills | Status: DC

## 2022-03-01 NOTE — Telephone Encounter (Signed)
MEDICATION: empagliflozin (JARDIANCE) 10 MG TABS tablet ? ?PHARMACY:  ?[facility] [address] [place], [place] - [address] Phone:  [phone]  ?Fax:  [phone]  ?  ? ? ?Comments: Patient called after hours about refill. ? ?**Let patient know to contact pharmacy at the end of the day to make sure medication is ready. ** ? ?** Please notify patient to allow 48-72 hours to process** ? ?**Encourage patient to contact the pharmacy for refills or they can request refills through [facility]** ? ?

## 2022-03-01 NOTE — Telephone Encounter (Signed)
Rx sent to pharmacy   

## 2022-03-09 ENCOUNTER — Ambulatory Visit (INDEPENDENT_AMBULATORY_CARE_PROVIDER_SITE_OTHER): Payer: Medicare HMO

## 2022-03-09 DIAGNOSIS — I495 Sick sinus syndrome: Secondary | ICD-10-CM

## 2022-03-09 LAB — CUP PACEART INCLINIC DEVICE CHECK
Battery Remaining Longevity: 135 mo
Battery Voltage: 3.08 V
Brady Statistic RA Percent Paced: 41 %
Brady Statistic RV Percent Paced: 0.01 %
Date Time Interrogation Session: 20230406144424
Implantable Lead Implant Date: 20120801
Implantable Lead Implant Date: 20120801
Implantable Lead Location: 753859
Implantable Lead Location: 753860
Implantable Pulse Generator Implant Date: 20230329
Lead Channel Impedance Value: 487.5 Ohm
Lead Channel Impedance Value: 525 Ohm
Lead Channel Pacing Threshold Amplitude: 1 V
Lead Channel Pacing Threshold Amplitude: 1 V
Lead Channel Pacing Threshold Amplitude: 1 V
Lead Channel Pacing Threshold Amplitude: 1 V
Lead Channel Pacing Threshold Pulse Width: 0.5 ms
Lead Channel Pacing Threshold Pulse Width: 0.5 ms
Lead Channel Pacing Threshold Pulse Width: 0.5 ms
Lead Channel Pacing Threshold Pulse Width: 0.5 ms
Lead Channel Sensing Intrinsic Amplitude: 5 mV
Lead Channel Sensing Intrinsic Amplitude: 7.5 mV
Lead Channel Setting Pacing Amplitude: 2.5 V
Lead Channel Setting Pacing Amplitude: 2.5 V
Lead Channel Setting Pacing Pulse Width: 0.5 ms
Lead Channel Setting Sensing Sensitivity: 2 mV
Pulse Gen Model: 2272
Pulse Gen Serial Number: 8052085

## 2022-03-09 NOTE — Patient Instructions (Addendum)

## 2022-03-09 NOTE — Progress Notes (Signed)
Wound check appointment. Steri-strips removed. Wound without redness or edema. Incision edges approximated, wound well healed. Normal device function. Thresholds, sensing, and impedances consistent with implant measurements. Device programmed at chronic outputs (chronic leads). Histogram distribution appropriate for patient and level of activity. No mode switches or high ventricular rates noted. Patient educated about wound care, arm mobility. ROV in 3 months with implanting physician.  Declined remote monitoring. ?

## 2022-05-19 ENCOUNTER — Ambulatory Visit: Payer: Medicare HMO | Admitting: Family Medicine

## 2022-05-19 NOTE — Progress Notes (Signed)
Patient will call back to sch apt - patient stated wife is hospitalized and needed to be with her     Note- (2nd message for this patient -original message from front desk did not go through)

## 2022-05-19 NOTE — Progress Notes (Signed)
He called office stating his wife was hospitalized and he needed to be with her- patient stated he would call back -

## 2022-06-02 ENCOUNTER — Encounter: Payer: Self-pay | Admitting: Internal Medicine

## 2022-06-02 ENCOUNTER — Ambulatory Visit: Payer: Medicare HMO | Admitting: Internal Medicine

## 2022-06-02 VITALS — BP 120/60 | HR 60 | Ht 75.0 in | Wt 254.0 lb

## 2022-06-02 DIAGNOSIS — I495 Sick sinus syndrome: Secondary | ICD-10-CM

## 2022-06-02 DIAGNOSIS — Z950 Presence of cardiac pacemaker: Secondary | ICD-10-CM

## 2022-06-02 DIAGNOSIS — I2510 Atherosclerotic heart disease of native coronary artery without angina pectoris: Secondary | ICD-10-CM | POA: Diagnosis not present

## 2022-06-02 NOTE — Patient Instructions (Addendum)
Medication Instructions:  Your physician recommends that you continue on your current medications as directed. Please refer to the Current Medication list given to you today.  Labwork: None ordered.  Testing/Procedures: None ordered.  Follow-Up: Your physician wants you to follow-up in: 6 months with the device clinic. You will receive a reminder letter in the mail two months in advance. If you don't receive a letter, please call our office to schedule the follow-up appointment.   Your physician wants you to follow-up in: one year with Lewayne Bunting, MD or one of the following Advanced Practice Providers on your designated Care Team:   Francis Dowse, [place] Casimiro Needle "Mardelle Matte" Lanna Poche, [place]    Any Other Special Instructions Will Be Listed Below (If Applicable).  If you need a refill on your cardiac medications before your next appointment, please call your pharmacy.   Important Information About Sugar

## 2022-06-02 NOTE — Progress Notes (Signed)
HPI Mr. Codner returns today for followup. He is a pleasant 66 yo man with symptomatic bradycardia, s/p PPM, HTN, obesity and a cerebral aneurysm, status post treatment.  He denies chest pain or sob. He has been working but under stress as his wife has become ill. He is s/p MI/PCI. He denies chest pain or sob. No syncope. Allergies  Allergen Reactions   Doxycycline Rash and Nausea Only   Lisinopril Rash   Losartan Potassium-Hctz Other (See Comments)    Caused dizziness & fatigue   Prednisone Other (See Comments)    Raises blood sugar     Current Outpatient Medications  Medication Sig Dispense Refill   aspirin EC 81 MG tablet Take 81 mg by mouth daily.     atorvastatin (LIPITOR) 80 MG tablet TAKE 1 TABLET EVERY DAY 90 tablet 2   empagliflozin (JARDIANCE) 10 MG TABS tablet Take 1 tablet (10 mg total) by mouth daily before breakfast. 30 tablet 5   glimepiride (AMARYL) 4 MG tablet TAKE 2 TABLETS  BY MOUTH DAILY BEFORE BREAKFAST. 180 tablet 1   metoprolol tartrate (LOPRESSOR) 25 MG tablet Take 25 mg by mouth daily.     nitroGLYCERIN (NITROSTAT) 0.4 MG SL tablet Place 1 tablet (0.4 mg total) under the tongue every 5 (five) minutes as needed for chest pain (up to 3 doses. call for help if doesnt resolve after 1 dose.). 30 tablet 3   No current facility-administered medications for this visit.     Past Medical History:  Diagnosis Date   Anxiety    Arthritis    Bell palsy    > 30 years. Left side of face droops a little   Bradycardia    Cerebral aneurysm    Cerebral aneurysm without rupture 06/2011   Middle of Cerebral Artery   Complication of anesthesia 2012   after surgery for cliping of cerebral anerysum- In the room, patient was shaking, and patient would awaken and then "go back out" shaking continued after he "went back out:   Constipation    Diabetes mellitus    now under control   Dysrhythmia    BRADYCARDIA REQUIRING PACEMAKER .FOLLOWED BY Nakeya Adinolfi    HYPERTENSION 12/10/2007   Pacemaker    Shoulder pain    L SHOULDER. RECENT FALL   Sleep apnea    CPAP.NOT WEARING NOW.NEEDS REPLACEMENT PIECE    ROS:   All systems reviewed and negative except as noted in the HPI.   Past Surgical History:  Procedure Laterality Date   CARDIAC CATHETERIZATION  07/06/11   normal   CEREBRAL ANEURYSM REPAIR  06/2011   "stenting and coiling" per patient   CORONARY STENT INTERVENTION N/A 07/21/2020   Procedure: CORONARY STENT INTERVENTION;  Surgeon: Iran Ouch, MD;  Location: [facility] INVASIVE CV LAB;  Service: Cardiovascular;  Laterality: N/A;   FRACTURE SURGERY Left    foot   INSERT / REPLACE / REMOVE PACEMAKER  2012   KNEE SURGERY Left    left; arthoscopic   LEFT HEART CATH AND CORONARY ANGIOGRAPHY N/A 07/21/2020   Procedure: LEFT HEART CATH AND CORONARY ANGIOGRAPHY;  Surgeon: Iran Ouch, MD;  Location: [facility] INVASIVE CV LAB;  Service: Cardiovascular;  Laterality: N/A;   PPM GENERATOR CHANGEOUT N/A 02/23/2022   Procedure: PPM GENERATOR CHANGEOUT;  Surgeon: Marinus Maw, MD;  Location[facility] INVASIVE CV LAB;  Service: Cardiovascular;  Laterality: N/A;   rotator cuff surgery Left 02/2012   septal deviation  SHOULDER ARTHROSCOPY WITH SUBACROMIAL DECOMPRESSION, ROTATOR CUFF REPAIR AND BICEP TENDON REPAIR Right 08/24/2015   Procedure: RIGHT SHOULDER DIAGNOSTIC OPERATIVE ARTHROSCOPY WITH DEBRIDEMENT, SUBACROMIAL DECOMPRESSION, MIMI-OPEN ROTATOR CUFF TEAR REPAIR AND POSSIBLE  BICEPS TENODESIS.  ;  Surgeon: Cammy Copa, MD;  Location: [facility] OR;  Service: Orthopedics;  Laterality: Right;  RIGHT SHOULDER DIAGNOSTIC OPERATIVE ARTHROSCOPY, DEBRIDEMENT, SUBACROMIAL DECOMPRESSION, MINI-OPEN ROTATOR CUFF TEAR REPAIR, POSSIB     Family History  Problem Relation Age of Onset   Heart attack Mother        died 62, smoker   Heart disease Mother    Diabetes Mother    Heart attack Father        died 34, smoker   Heart disease Father      Social History    Socioeconomic History   Marital status: Married    Spouse name: Not on file   Number of children: Not on file   Years of education: Not on file   Highest education level: Not on file  Occupational History   Occupation: Disabled  Tobacco Use   Smoking status: Former    Packs/day: 1.00    Years: 20.00    Total pack years: 20.00    Types: Cigarettes    Quit date: 07/05/2011    Years since quitting: 10.9   Smokeless tobacco: Never  Vaping Use   Vaping Use: Never used  Substance and Sexual Activity   Alcohol use: No    Alcohol/week: 0.0 standard drinks of alcohol   Drug use: No   Sexual activity: Not on file  Other Topics Concern   Not on file  Social History Narrative   Married over 30 years in 2015. 1 step son. 1 grandson.       Disabled from brain aneurysm, pacemaker. Retired from [employer] years.       Hobbies: grandson, exercise at [facility]   Social Determinants of Health   Financial Resource Strain: Low Risk  (10/24/2021)   Overall Financial Resource Strain (CARDIA)    Difficulty of Paying Living Expenses: Not hard at all  Food Insecurity: No Food Insecurity (10/24/2021)   Hunger Vital Sign    Worried About Running Out of Food in the Last Year: Never true    Ran Out of Food in the Last Year: Never true  Transportation Needs: No Transportation Needs (10/24/2021)   PRAPARE - Administrator, Civil Service (Medical): No    Lack of Transportation (Non-Medical): No  Physical Activity: Sufficiently Active (10/24/2021)   Exercise Vital Sign    Days of Exercise per Week: 5 days    Minutes of Exercise per Session: 60 min  Stress: No Stress Concern Present (10/24/2021)   [employer] of Occupational Health - Occupational Stress Questionnaire    Feeling of Stress : Not at all  Social Connections: Moderately Isolated (10/24/2021)   Social Connection and Isolation Panel [NHANES]    Frequency of Communication with Friends and Family: Once a week     Frequency of Social Gatherings with Friends and Family: Once a week    Attends Religious Services: 1 to 4 times per year    Active Member of [employer] or Organizations: No    Attends Banker Meetings: Never    Marital Status: Married  Catering manager Violence: Not At Risk (10/24/2021)   Humiliation, Afraid, Rape, and Kick questionnaire    Fear of Current or Ex-Partner: No    Emotionally Abused: No    Physically Abused:  No    Sexually Abused: No     BP 120/60   Pulse 60   Ht 6\' 3"  (1.905 m)   Wt 254 lb (115.2 kg)   SpO2 97%   BMI 31.75 kg/m   Physical Exam:  Well appearing NAD HEENT: Unremarkable Neck:  No JVD, no thyromegally Lymphatics:  No adenopathy Back:  No CVA tenderness Lungs:  Clear with no wheezes HEART:  Regular rate rhythm, no murmurs, no rubs, no clicks Abd:  soft, positive bowel sounds, no organomegally, no rebound, no guarding Ext:  2 plus pulses, no edema, no cyanosis, no clubbing Skin:  No rashes no nodules Neuro:  CN II through XII intact, motor grossly intact  EKG - nsr with atrial pacing and anterior MI  DEVICE  Normal device function.  See PaceArt for details.   Assess/Plan:  Sinus node dysfunction - he is s/p PPM insertion.  2. CAD - he is s/p recent anterior MI. He denies anginal symptoms and feels well. 3. Obesity - his weight is neither up or down.  4. PPM - his St. Jude DDD PM is working normally. We will recheck in several months.   Earmon Sherrow,MD

## 2022-06-21 NOTE — Progress Notes (Signed)
Spoke with patient.  He has declined to reschedule visit due to having to now take care of spouse.    I have informed him to give [place] a call back as soon as possible to rescheduled his appointment.

## 2022-07-04 ENCOUNTER — Other Ambulatory Visit: Payer: Self-pay | Admitting: Family Medicine

## 2022-07-11 ENCOUNTER — Encounter: Payer: Self-pay | Admitting: Family Medicine

## 2022-08-15 ENCOUNTER — Other Ambulatory Visit: Payer: Self-pay | Admitting: Internal Medicine

## 2022-08-28 ENCOUNTER — Encounter: Payer: Self-pay | Admitting: *Deleted

## 2022-09-15 ENCOUNTER — Encounter: Payer: Self-pay | Admitting: Family Medicine

## 2022-09-15 ENCOUNTER — Ambulatory Visit (INDEPENDENT_AMBULATORY_CARE_PROVIDER_SITE_OTHER): Payer: Medicare HMO | Admitting: Family Medicine

## 2022-09-15 ENCOUNTER — Other Ambulatory Visit: Payer: Self-pay | Admitting: Internal Medicine

## 2022-09-15 VITALS — BP 130/60 | HR 71 | Temp 97.8°F | Ht 75.0 in | Wt 256.0 lb

## 2022-09-15 DIAGNOSIS — E1169 Type 2 diabetes mellitus with other specified complication: Secondary | ICD-10-CM

## 2022-09-15 DIAGNOSIS — Z23 Encounter for immunization: Secondary | ICD-10-CM

## 2022-09-15 DIAGNOSIS — I2510 Atherosclerotic heart disease of native coronary artery without angina pectoris: Secondary | ICD-10-CM | POA: Diagnosis not present

## 2022-09-15 DIAGNOSIS — E785 Hyperlipidemia, unspecified: Secondary | ICD-10-CM | POA: Diagnosis not present

## 2022-09-15 DIAGNOSIS — I10 Essential (primary) hypertension: Secondary | ICD-10-CM

## 2022-09-15 DIAGNOSIS — E1165 Type 2 diabetes mellitus with hyperglycemia: Secondary | ICD-10-CM | POA: Diagnosis not present

## 2022-09-15 LAB — POCT GLYCOSYLATED HEMOGLOBIN (HGB A1C): Hemoglobin A1C: 10.9 % — AB (ref 4.0–5.6)

## 2022-09-15 LAB — COMPREHENSIVE METABOLIC PANEL
ALT: 67 U/L — ABNORMAL HIGH (ref 0–53)
AST: 58 U/L — ABNORMAL HIGH (ref 0–37)
Albumin: 3.9 g/dL (ref 3.5–5.2)
Alkaline Phosphatase: 91 U/L (ref 39–117)
BUN: 13 mg/dL (ref 6–23)
CO2: 25 mEq/L (ref 19–32)
Calcium: 9.3 mg/dL (ref 8.4–10.5)
Chloride: 101 mEq/L (ref 96–112)
Creatinine, Ser: 0.83 mg/dL (ref 0.40–1.50)
GFR: 90.61 mL/min (ref >60.00)
Glucose, Bld: 272 mg/dL — ABNORMAL HIGH (ref 70–99)
Potassium: 4.1 mEq/L (ref 3.5–5.1)
Sodium: 134 mEq/L — ABNORMAL LOW (ref 135–145)
Total Bilirubin: 1.1 mg/dL (ref 0.2–1.2)
Total Protein: 7.6 g/dL (ref 6.0–8.3)

## 2022-09-15 MED ORDER — ATORVASTATIN CALCIUM 80 MG PO TABS: 80.0000 mg | ORAL_TABLET | Freq: Every day | ORAL | 3 refills | Status: DC

## 2022-09-15 MED ORDER — INSULIN PEN NEEDLE 31G X 8 MM MISC
1.0000 | Freq: Two times a day (BID) | 11 refills | Status: DC
Start: 2022-09-15 — End: 2023-10-23

## 2022-09-15 MED ORDER — NOVOLOG 70/30 FLEXPEN RELION (70-30) 100 UNIT/ML ~~LOC~~ SUPN: 5.0000 [IU] | PEN_INJECTOR | Freq: Two times a day (BID) | SUBCUTANEOUS | 11 refills | Status: DC

## 2022-09-15 NOTE — Progress Notes (Addendum)
Phone [phone] In person visit   Subjective:   Paul Paul is a 66 y.o. year old very pleasant male patient who presents for/with See problem oriented charting Chief Complaint  Patient presents with   Follow-up   Diabetes   Past Medical History-  Patient Active Problem List   Diagnosis Date Noted   CAD (coronary artery disease) status post drug-eluting stent mid LAD August 2021 07/28/2020    Priority: High   Sinus node dysfunction ([place]) 08/05/2019    Priority: High   Cerebral aneurysm without rupture 01/01/2012    Priority: High   Pacemaker 10/07/2011    Priority: High   Poorly controlled type 2 diabetes mellitus ([place]) 11/09/2008    Priority: High   Aortic atherosclerosis ([place]) 07/28/2020    Priority: Medium    Fatty liver 06/18/2018    Priority: Medium    Hepatotoxicity due to statin drug 10/19/2017    Priority: Medium    Elevated LFTs 08/09/2017    Priority: Medium    GAD (generalized anxiety disorder) 10/11/2016    Priority: Medium    OSA on CPAP 09/24/2014    Priority: Medium    Hyperlipidemia associated with type 2 diabetes mellitus ([place]) 09/24/2014    Priority: Medium    Other specified cardiac dysrhythmias(427.89) 07/09/2012    Priority: Medium    Essential hypertension 12/10/2007    Priority: Medium    Obesity 01/01/2014    Priority: Low   Hyperlipidemia 10/19/2020   Chronic pruritic rash in adult 08/09/2017    Medications- reviewed and updated Current Outpatient Medications  Medication Sig Dispense Refill   aspirin EC 81 MG tablet Take 81 mg by mouth daily.     glimepiride (AMARYL) 4 MG tablet TAKE 2 TABLETS DAILY BEFORE BREAKFAST. 180 tablet 1   insulin aspart protamine - aspart (NOVOLOG 70/30 FLEXPEN) (70-30) 100 UNIT/ML FlexPen Inject 5-20 Units into the skin 2 (two) times daily. 15 mL 11   Insulin Pen Needle 31G X 8 MM MISC 1 each by Does not apply route 2 (two) times daily before a meal. 100 each 11   metoprolol tartrate (LOPRESSOR) 25 MG  tablet TAKE 1 TABLET EVERY DAY 90 tablet 2   nitroGLYCERIN (NITROSTAT) 0.4 MG SL tablet Place 1 tablet (0.4 mg total) under the tongue every 5 (five) minutes as needed for chest pain (up to 3 doses. call for help if doesnt resolve after 1 dose.). 30 tablet 3   atorvastatin (LIPITOR) 80 MG tablet Take 1 tablet (80 mg total) by mouth daily. 90 tablet 3   No current facility-administered medications for this visit.     Objective:  BP 130/60   Pulse 71   Temp 97.8 F (36.6 C)   Ht 6\' 3"  (1.905 m)   Wt 256 lb (116.1 kg)   SpO2 96%   BMI 32.00 kg/m  Gen: NAD, resting comfortably    Assessment and Plan   %History of cerebral aneurysm without rupture. Follows with [facility] radiology - in 2012 had 2 stents and coils.  Remains on aspirin - encouraged to call for follow up   #Diabetes mellitus-poor control. Metformin intolerant- GI. Declines insulin since 2020 S: compliant with 8mg  glimepiride daily, added Jardiance 10 mg last visit (most recently went up and stopped 2 weeks ago) Large swings in sugars in past as far as control with improved diet/exercise but usually not controlled.  CBGs- 350 this morning before sausage biscuit, mostly 200s while on jardiance now 300s off jardiance  Lab Results  Component Value Date   HGBA1C 10.9 (A) 09/15/2022   HGBA1C 13.4 (H) 01/12/2022   HGBA1C 8.7 (A) 09/28/2020  A/P: diabetes improving on jardiance and glimepiride but still above goal with a1c of 10.9 and jardiance too expensive. We are going to try for relion insulin 70/30 to take twice daily before breakfast and dinner -for the first week only take 5 units before breakfast then update me with sugars- I will likely increase this to 10 units slowly before adding in 5 units before dinner- we will work our way up slowly to avoid lows -Wife expresses significant concern about affordability even with ReliOn insulin.  With affordability issues with [facility] we will place referral to [facility]-they also expressed  concern about multiple medical bills and I think we could use some assistance and regular follow-up on blood sugars also with nursing team -refer to CCM all 3 disciplines today    #Hypertension S: Controlled on metoprolol 25 mg BID  BP Readings from Last 3 Encounters:  09/15/22 130/60  06/02/22 120/60  02/23/22 (!) 141/80   A/P:  Controlled. Continue current medications.    #CAD with history of stents  #Hyperlipidemia/elevated LFTs-but has done better on atorvastatin S: Medication: Atorvastatin 80 mg, aspirin 81 mg -no CP or SOB Lab Results  Component Value Date   CHOL 121 01/12/2022   HDL 45.20 01/12/2022   LDLCALC 58 01/12/2022   LDLDIRECT 104.0 08/09/2017   TRIG 93.0 01/12/2022   CHOLHDL 3 01/12/2022   -Prior trials: We tried lovastatin 20mg  once a week but had stomach upset on this.  A/P: CAD asymptomatic- continue current meds Lipids-Well-controlled most recently-continue current medication  #Colonoscopy discussion-declines even with risk of cancer   Recommended follow up: Return in about 4 months (around 01/16/2023) for physical or sooner if needed.Schedule b4 you leave.  Lab/Order associations:   ICD-10-CM   1. Poorly controlled type 2 diabetes mellitus (HCC)  E11.65 CBC with Differential/Platelet    Comprehensive metabolic panel    POCT HgB A1C    2. Coronary artery disease involving native coronary artery of native heart without angina pectoris  I25.10     3. Essential hypertension  I10     4. Hyperlipidemia associated with type 2 diabetes mellitus (HCC)  E11.69    E78.5     5. Need for immunization against influenza  Z23 Flu Vaccine QUAD High Dose(Fluad)      Meds ordered this encounter  Medications   atorvastatin (LIPITOR) 80 MG tablet    Sig: Take 1 tablet (80 mg total) by mouth daily.    Dispense:  90 tablet    Refill:  3   insulin aspart protamine - aspart (NOVOLOG 70/30 FLEXPEN) (70-30) 100 UNIT/ML FlexPen    Sig: Inject 5-20 Units into the skin 2  (two) times daily.    Dispense:  15 mL    Refill:  11    Trying to prescribe relion flex pen- around $85 for 15 ml I believe   Insulin Pen Needle 31G X 8 MM MISC    Sig: 1 each by Does not apply route 2 (two) times daily before a meal.    Dispense:  100 each    Refill:  11    Looking for relion pen needles to use with relion flex pen    Return precautions advised.  01/18/2023, MD

## 2022-09-15 NOTE — Addendum Note (Signed)
Addended by: Marin Olp on: 09/15/2022 02:29 PM   Modules accepted: Orders

## 2022-09-15 NOTE — Patient Instructions (Addendum)
Get Diabetic Eye exam scheduled.  Let us know when you get your [facility] vaccine at your pharmacy.  We are going to try for relion insulin 70/30 to take twice daily before breakfast and dinner -for the first week only take 5 units before breakfast then update me with sugars- I will likely increase this to 10 units slowly before adding in 5 units before dinner- we will work our way up slowly to avoid lows - let me know about any low sugars  Please stop by lab before you go If you have mychart- we will send your results within 3 business days of [place] receiving them.  If you do not have mychart- we will call you about results within 5 business days of [place] receiving them.  *please also note that you will see labs on mychart as soon as they post. I will later go in and write notes on them- will say "notes from Dr. Yong Channel"   Recommended follow up: Return in about 4 months (around 01/16/2023) for physical or sooner if needed.Schedule b4 you leave.

## 2022-09-18 LAB — CBC WITH DIFFERENTIAL/PLATELET
Basophils Absolute: 0.1 10*3/uL (ref 0.0–0.1)
Basophils Relative: 1.7 % (ref 0.0–3.0)
Eosinophils Absolute: 0.2 10*3/uL (ref 0.0–0.7)
Eosinophils Relative: 3.9 % (ref 0.0–5.0)
HCT: 44.6 % (ref 39.0–52.0)
Hemoglobin: 15.4 g/dL (ref 13.0–17.0)
Lymphocytes Relative: 38 % (ref 12.0–46.0)
Lymphs Abs: 2 10*3/uL (ref 0.7–4.0)
MCHC: 34.5 g/dL (ref 30.0–36.0)
MCV: 89.7 fl (ref 78.0–100.0)
Monocytes Absolute: 0.5 10*3/uL (ref 0.1–1.0)
Monocytes Relative: 10 % (ref 3.0–12.0)
Neutro Abs: 2.5 10*3/uL (ref 1.4–7.7)
Neutrophils Relative %: 46 % (ref 43.0–77.0)
Platelets: 157 10*3/uL (ref 150.0–400.0)
RBC: 4.97 Mil/uL (ref 4.22–5.81)
RDW: 12.9 % (ref 11.5–15.5)
WBC: 5.3 10*3/uL (ref 4.0–10.5)
nRBC: 0 /100 WBC (ref 0–4)

## 2022-10-17 ENCOUNTER — Telehealth: Payer: Self-pay | Admitting: Family Medicine

## 2022-10-17 NOTE — Telephone Encounter (Signed)
Called and spoke with pt and confirmed below insulin dose, pt will increase as instructed.

## 2022-10-17 NOTE — Telephone Encounter (Signed)
FYI

## 2022-10-17 NOTE — Telephone Encounter (Signed)
Please confirm current dose of insulin-I believe 5 units twice daily-if this is the case have him increase to 7 units in the morning before breakfast and 5 units before dinner.  Let me know if any lows and update me in another week

## 2022-10-17 NOTE — Telephone Encounter (Signed)
Pt states: -has a new phone and could not figure out how to submit his weekly readings report. -week of 10/09/22, blood sugar reading was between 200-250, it did not go below 200.   No further follow up requested at this time.

## 2022-10-19 ENCOUNTER — Telehealth: Payer: Self-pay

## 2022-10-19 NOTE — Progress Notes (Signed)
Error

## 2022-10-19 NOTE — Progress Notes (Signed)
Chronic Care Management   Note  10/19/2022 Name: Paul Paul MRN: [id number] DOB: [birth year]  Paul Paul is a 66 y.o. year old male who is a primary care patient of Shelva Majestic, MD. I reached out to Paul Paul by phone today in response to a referral sent by Paul Paul's PCP.  Paul Paul was not successfully contacted today. A HIPAA compliant voice message was left requesting a return call.   Follow up plan: Additional outreach attempts will be made.  Paul Paul, RMA Care Guide [facility]  [place], [place] [address] Direct Dial: [phone] [email or web address]

## 2022-10-24 ENCOUNTER — Encounter: Payer: Self-pay | Admitting: Family Medicine

## 2022-10-24 ENCOUNTER — Ambulatory Visit (INDEPENDENT_AMBULATORY_CARE_PROVIDER_SITE_OTHER): Payer: Medicare HMO | Admitting: Family Medicine

## 2022-10-24 VITALS — BP 138/60 | HR 60 | Temp 98.7°F | Ht 75.0 in | Wt 257.2 lb

## 2022-10-24 DIAGNOSIS — J208 Acute bronchitis due to other specified organisms: Secondary | ICD-10-CM

## 2022-10-24 DIAGNOSIS — E1165 Type 2 diabetes mellitus with hyperglycemia: Secondary | ICD-10-CM | POA: Diagnosis not present

## 2022-10-24 DIAGNOSIS — B9689 Other specified bacterial agents as the cause of diseases classified elsewhere: Secondary | ICD-10-CM | POA: Diagnosis not present

## 2022-10-24 DIAGNOSIS — R051 Acute cough: Secondary | ICD-10-CM | POA: Diagnosis not present

## 2022-10-24 LAB — POC COVID19 BINAXNOW: SARS Coronavirus 2 Ag: NEGATIVE

## 2022-10-24 MED ORDER — AZITHROMYCIN 250 MG PO TABS: ORAL_TABLET | ORAL | 0 refills | Status: DC

## 2022-10-24 NOTE — Progress Notes (Signed)
Subjective  CC:  Chief Complaint  Patient presents with   Cough    Pt stated that he has been coughing for the past 5 days and now starting to cough up green mocous    HPI: SUBJECTIVE:  Paul Paul is a 66 y.o. male who complains of congestion, nasal blockage, post nasal drip, cough described as productive and denies sinus, high fevers, SOB, chest pain or significant GI symptoms. Symptoms have been present for 4-5 days. He denies a history of anorexia, dizziness, vomiting and wheezing. He denies a history of asthma or COPD. Patient does not smoke cigarettes. He is an uncontrolled diabetic but reports his sugars are stable.   Assessment  1. Acute bacterial bronchitis   2. Acute cough   3. Poorly controlled type 2 diabetes mellitus (HCC)      Plan  Discussion:  Treat for bacterial bronchitis due to prolonged course and worsening symptoms. Education regarding differences between viral and bacterial infections and treatment options are discussed.  Supportive care measures are recommended.  We discussed the use of mucolytic's, decongestants, antihistamines and antitussives as needed.  Tylenol or Advil are recommended if needed.  Follow up: prn   Orders Placed This Encounter  Procedures   POC COVID-19   Meds ordered this encounter  Medications   azithromycin (ZITHROMAX) 250 MG tablet    Sig: Take 2 tabs today, then 1 tab daily for 4 days    Dispense:  1 each    Refill:  0      I reviewed the patients updated PMH, FH, and SocHx.  Social History: Patient  reports that he quit smoking about 11 years ago. His smoking use included cigarettes. He has a 20.00 pack-year smoking history. He has never used smokeless tobacco. He reports that he does not drink alcohol and does not use drugs.  Patient Active Problem List   Diagnosis Date Noted   Hyperlipidemia 10/19/2020   Aortic atherosclerosis (HCC) 07/28/2020   CAD (coronary artery disease) status post drug-eluting stent mid LAD  August 2021 07/28/2020   Sinus node dysfunction (HCC) 08/05/2019   Fatty liver 06/18/2018   Hepatotoxicity due to statin drug 10/19/2017   Elevated LFTs 08/09/2017   Chronic pruritic rash in adult 08/09/2017   GAD (generalized anxiety disorder) 10/11/2016   OSA on CPAP 09/24/2014   Hyperlipidemia associated with type 2 diabetes mellitus (HCC) 09/24/2014   Obesity 01/01/2014   Other specified cardiac dysrhythmias(427.89) 07/09/2012   Cerebral aneurysm without rupture 01/01/2012   Pacemaker 10/07/2011   Poorly controlled type 2 diabetes mellitus (HCC) 11/09/2008   Essential hypertension 12/10/2007    Review of Systems: Cardiovascular: negative for chest pain Respiratory: negative for SOB or hemoptysis Gastrointestinal: negative for abdominal pain Genitourinary: negative for dysuria or gross hematuria Current Meds  Medication Sig   aspirin EC 81 MG tablet Take 81 mg by mouth daily.   atorvastatin (LIPITOR) 80 MG tablet Take 1 tablet (80 mg total) by mouth daily.   azithromycin (ZITHROMAX) 250 MG tablet Take 2 tabs today, then 1 tab daily for 4 days   glimepiride (AMARYL) 4 MG tablet TAKE 2 TABLETS DAILY BEFORE BREAKFAST.   insulin aspart protamine - aspart (NOVOLOG 70/30 FLEXPEN) (70-30) 100 UNIT/ML FlexPen Inject 5-20 Units into the skin 2 (two) times daily.   Insulin Pen Needle 31G X 8 MM MISC 1 each by Does not apply route 2 (two) times daily before a meal.   metoprolol tartrate (LOPRESSOR) 25 MG tablet TAKE  1 TABLET EVERY DAY   nitroGLYCERIN (NITROSTAT) 0.4 MG SL tablet Place 1 tablet (0.4 mg total) under the tongue every 5 (five) minutes as needed for chest pain (up to 3 doses. call for help if doesnt resolve after 1 dose.).    Objective  Vitals: BP 138/60   Pulse 60   Temp 98.7 F (37.1 C)   Ht 6\' 3"  (1.905 m)   Wt 257 lb 3.2 oz (116.7 kg)   SpO2 96%   BMI 32.15 kg/m  General: no acute distress  Psych:  Alert and oriented, normal mood and affect HEENT:  Normocephalic,  atraumatic, supple neck, moist mucous membranes, mildly erythematous pharynx without exudate, mild lymphadenopathy, supple neck Cardiovascular:  RRR without murmur. no edema Respiratory:  Good breath sounds bilaterally, CTAB with normal respiratory effort with occasional rhonchi  Office Visit on 10/24/2022  Component Date Value Ref Range Status   SARS Coronavirus 2 Ag 10/24/2022 Negative  Negative Final   Lab Results  Component Value Date   HGBA1C 10.9 (A) 09/15/2022     Commons side effects, risks, benefits, and alternatives for medications and treatment plan prescribed today were discussed, and the patient expressed understanding of the given instructions. Patient is instructed to call or message via MyChart if he/she has any questions or concerns regarding our treatment plan. No barriers to understanding were identified. We discussed Red Flag symptoms and signs in detail. Patient expressed understanding regarding what to do in case of urgent or emergency type symptoms.  Medication list was reconciled, printed and provided to the patient in AVS. Patient instructions and summary information was reviewed with the patient as documented in the AVS. This note was prepared with assistance of Dragon voice recognition software. Occasional wrong-word or sound-a-like substitutions may have occurred due to the inherent limitations of voice recognition software

## 2022-10-24 NOTE — Patient Instructions (Signed)
Please follow up if symptoms do not improve or as needed.    Monitor your sugars while you are sick.  Acute Bronchitis, Adult  Acute bronchitis is sudden inflammation of the main airways (bronchi) that come off the windpipe (trachea) in the lungs. The swelling causes the airways to get smaller and make more mucus than normal. This can make it hard to breathe and can cause coughing or noisy breathing (wheezing). Acute bronchitis may last several weeks. The cough may last longer. Allergies, asthma, and exposure to smoke may make the condition worse. What are the causes? This condition can be caused by germs and by substances that irritate the lungs, including: Cold and flu viruses. The most common cause of this condition is the virus that causes the common cold. Bacteria. This is less common. Breathing in substances that irritate the lungs, including: Smoke from cigarettes and other forms of tobacco. Dust and pollen. Fumes from household cleaning products, gases, or burned fuel. Indoor or outdoor air pollution. What increases the risk? The following factors may make you more likely to develop this condition: A weak body's defense system, also called the immune system. A condition that affects your lungs and breathing, such as asthma. What are the signs or symptoms? Common symptoms of this condition include: Coughing. This may bring up clear, yellow, or green mucus from your lungs (sputum). Wheezing. Runny or stuffy nose. Having too much mucus in your lungs (chest congestion). Shortness of breath. Aches and pains, including sore throat or chest. How is this diagnosed? This condition is usually diagnosed based on: Your symptoms and medical history. A physical exam. You may also have other tests, including tests to rule out other conditions, such as pneumonia. These tests include: A test of lung function. Test of a mucus sample to look for the presence of bacteria. Tests to check the  oxygen level in your blood. Blood tests. Chest X-ray. How is this treated? Most cases of acute bronchitis clear up over time without treatment. Your health care provider may recommend: Drinking more fluids to help thin your mucus so it is easier to cough up. Taking inhaled medicine (inhaler) to improve air flow in and out of your lungs. Using a vaporizer or a humidifier. These are machines that add water to the air to help you breathe better. Taking a medicine that thins mucus and clears congestion (expectorant). Taking a medicine that prevents or stops coughing (cough suppressant). It is not common to take an antibiotic medicine for this condition. Follow these instructions at home:  Take over-the-counter and prescription medicines only as told by your health care provider. Use an inhaler, vaporizer, or humidifier as told by your health care provider. Take two teaspoons (10 mL) of honey at bedtime to lessen coughing at night. Drink enough fluid to keep your urine pale yellow. Do not use any products that contain nicotine or tobacco. These products include cigarettes, chewing tobacco, and vaping devices, such as e-cigarettes. If you need help quitting, ask your health care provider. Get plenty of rest. Return to your normal activities as told by your health care provider. Ask your health care provider what activities are safe for you. Keep all follow-up visits. This is important. How is this prevented? To lower your risk of getting this condition again: Wash your hands often with soap and water for at least 20 seconds. If soap and water are not available, use hand sanitizer. Avoid contact with people who have cold symptoms. Try not to touch  your mouth, nose, or eyes with your hands. Avoid breathing in smoke or chemical fumes. Breathing smoke or chemical fumes will make your condition worse. Get the flu shot every year. Contact a health care provider if: Your symptoms do not improve after  2 weeks. You have trouble coughing up the mucus. Your cough keeps you awake at night. You have a fever. Get help right away if you: Cough up blood. Feel pain in your chest. Have severe shortness of breath. Faint or keep feeling like you are going to faint. Have a severe headache. Have a fever or chills that get worse. These symptoms may represent a serious problem that is an emergency. Do not wait to see if the symptoms will go away. Get medical help right away. Call your local emergency services (911 in the [place] Do not drive yourself to the hospital. Summary Acute bronchitis is inflammation of the main airways (bronchi) that come off the windpipe (trachea) in the lungs. The swelling causes the airways to get smaller and make more mucus than normal. Drinking more fluids can help thin your mucus so it is easier to cough up. Take over-the-counter and prescription medicines only as told by your health care provider. Do not use any products that contain nicotine or tobacco. These products include cigarettes, chewing tobacco, and vaping devices, such as e-cigarettes. If you need help quitting, ask your health care provider. Contact a health care provider if your symptoms do not improve after 2 weeks. This information is not intended to replace advice given to you by your health care provider. Make sure you discuss any questions you have with your health care provider. Document Revised: 03/02/2022 Document Reviewed: 03/23/2021 Elsevier Patient Education  2023 [employer].

## 2022-10-25 NOTE — Progress Notes (Signed)
Chronic Care Management   Note  10/25/2022 Name: Paul Paul MRN: [id number] DOB: 1956-06-03  Paul Paul is a 66 y.o. year old male who is a primary care patient of Shelva Majestic, MD. I reached out to [name] by phone today in response to a referral sent by Paul Paul's PCP.  Paul Paul  agreedto scheduling an appointment with the CCM RN Case Manager   Follow up plan: Patient agreed to scheduled appointment with RN Case Manager on 11/06/2022(date/time).   [name], RMA Care Guide [facility]  [place], [place] [address] Direct Dial: [phone] [email or web address]

## 2022-10-31 ENCOUNTER — Ambulatory Visit (INDEPENDENT_AMBULATORY_CARE_PROVIDER_SITE_OTHER): Payer: Medicare HMO

## 2022-10-31 VITALS — Wt 257.0 lb

## 2022-10-31 DIAGNOSIS — Z0000 Encounter for general adult medical examination without abnormal findings: Secondary | ICD-10-CM

## 2022-10-31 NOTE — Telephone Encounter (Signed)
FYI  Patient states: - Blood sugars have stayed between 200-210 this past week  - Has been taking 7 units in the morning and 5 units at night daily

## 2022-10-31 NOTE — Patient Instructions (Signed)
Paul Paul , Thank you for taking time to come for your Medicare Wellness Visit. I appreciate your ongoing commitment to your health goals. Please review the following plan we discussed and let me know if I can assist you in the future.   These are the goals we discussed:  Goals      Patient Stated     Keep blood sugar down      Patient Stated     None at this time        This is a list of the screening recommended for you and due dates:  Health Maintenance  Topic Date Due   Screening for Lung Cancer  Never done   Eye exam for diabetics  12/28/2017   Medicare Annual Wellness Visit  10/24/2022   COVID-19 Vaccine (2 - Janssen risk series) 11/09/2022*   Zoster (Shingles) Vaccine (1 of 2) 12/16/2022*   Yearly kidney health urinalysis for diabetes  01/12/2023   Complete foot exam   01/12/2023   Hemoglobin A1C  03/17/2023   Yearly kidney function blood test for diabetes  09/16/2023   Pneumonia Vaccine  Completed   Flu Shot  Completed   Hepatitis C Screening: USPSTF Recommendation to screen - Ages 18-79 yo.  Completed   HPV Vaccine  Aged Out   Colon Cancer Screening  Discontinued  *Topic was postponed. The date shown is not the original due date.    Advanced directives: Advance directive discussed with you today. Even though you declined this today please call our office should you change your mind and we can give you the proper paperwork for you to fill out.  Conditions/risks identified: keep blood sugar lower   Next appointment: Follow up in one year for your annual wellness visit.   Preventive Care 19 Years and Older, Male  Preventive care refers to lifestyle choices and visits with your health care provider that can promote health and wellness. What does preventive care include? A yearly physical exam. This is also called an annual well check. Dental exams once or twice a year. Routine eye exams. Ask your health care provider how often you should have your eyes  checked. Personal lifestyle choices, including: Daily care of your teeth and gums. Regular physical activity. Eating a healthy diet. Avoiding tobacco and drug use. Limiting alcohol use. Practicing safe sex. Taking low doses of aspirin every day. Taking vitamin and mineral supplements as recommended by your health care provider. What happens during an annual well check? The services and screenings done by your health care provider during your annual well check will depend on your age, overall health, lifestyle risk factors, and family history of disease. Counseling  Your health care provider may ask you questions about your: Alcohol use. Tobacco use. Drug use. Emotional well-being. Home and relationship well-being. Sexual activity. Eating habits. History of falls. Memory and ability to understand (cognition). Work and work Astronomer. Screening  You may have the following tests or measurements: Height, weight, and BMI. Blood pressure. Lipid and cholesterol levels. These may be checked every 5 years, or more frequently if you are over 74 years old. Skin check. Lung cancer screening. You may have this screening every year starting at age 52 if you have a 30-pack-year history of smoking and currently smoke or have quit within the past 15 years. Fecal occult blood test (FOBT) of the stool. You may have this test every year starting at age 53. Flexible sigmoidoscopy or colonoscopy. You may have a sigmoidoscopy every  5 years or a colonoscopy every 10 years starting at age 48. Prostate cancer screening. Recommendations will vary depending on your family history and other risks. Hepatitis C blood test. Hepatitis B blood test. Sexually transmitted disease (STD) testing. Diabetes screening. This is done by checking your blood sugar (glucose) after you have not eaten for a while (fasting). You may have this done every 1-3 years. Abdominal aortic aneurysm (AAA) screening. You may need this  if you are a current or former smoker. Osteoporosis. You may be screened starting at age 85 if you are at high risk. Talk with your health care provider about your test results, treatment options, and if necessary, the need for more tests. Vaccines  Your health care provider may recommend certain vaccines, such as: Influenza vaccine. This is recommended every year. Tetanus, diphtheria, and acellular pertussis (Tdap, Td) vaccine. You may need a Td booster every 10 years. Zoster vaccine. You may need this after age 61. Pneumococcal 13-valent conjugate (PCV13) vaccine. One dose is recommended after age 7. Pneumococcal polysaccharide (PPSV23) vaccine. One dose is recommended after age 45. Talk to your health care provider about which screenings and vaccines you need and how often you need them. This information is not intended to replace advice given to you by your health care provider. Make sure you discuss any questions you have with your health care provider. Document Released: 12/17/2015 Document Revised: 08/09/2016 Document Reviewed: 09/21/2015 Elsevier Interactive Patient Education  2017 [employer].  Fall Prevention in the Home Falls can cause injuries. They can happen to people of all ages. There are many things you can do to make your home safe and to help prevent falls. What can I do on the outside of my home? Regularly fix the edges of walkways and driveways and fix any cracks. Remove anything that might make you trip as you walk through a door, such as a raised step or threshold. Trim any bushes or trees on the path to your home. Use bright outdoor lighting. Clear any walking paths of anything that might make someone trip, such as rocks or tools. Regularly check to see if handrails are loose or broken. Make sure that both sides of any steps have handrails. Any raised decks and porches should have guardrails on the edges. Have any leaves, snow, or ice cleared regularly. Use sand  or salt on walking paths during winter. Clean up any spills in your garage right away. This includes oil or grease spills. What can I do in the bathroom? Use night lights. Install grab bars by the toilet and in the tub and shower. Do not use towel bars as grab bars. Use non-skid mats or decals in the tub or shower. If you need to sit down in the shower, use a plastic, non-slip stool. Keep the floor dry. Clean up any water that spills on the floor as soon as it happens. Remove soap buildup in the tub or shower regularly. Attach bath mats securely with double-sided non-slip rug tape. Do not have throw rugs and other things on the floor that can make you trip. What can I do in the bedroom? Use night lights. Make sure that you have a light by your bed that is easy to reach. Do not use any sheets or blankets that are too big for your bed. They should not hang down onto the floor. Have a firm chair that has side arms. You can use this for support while you get dressed. Do not have throw  rugs and other things on the floor that can make you trip. What can I do in the kitchen? Clean up any spills right away. Avoid walking on wet floors. Keep items that you use a lot in easy-to-reach places. If you need to reach something above you, use a strong step stool that has a grab bar. Keep electrical cords out of the way. Do not use floor polish or wax that makes floors slippery. If you must use wax, use non-skid floor wax. Do not have throw rugs and other things on the floor that can make you trip. What can I do with my stairs? Do not leave any items on the stairs. Make sure that there are handrails on both sides of the stairs and use them. Fix handrails that are broken or loose. Make sure that handrails are as long as the stairways. Check any carpeting to make sure that it is firmly attached to the stairs. Fix any carpet that is loose or worn. Avoid having throw rugs at the top or bottom of the stairs.  If you do have throw rugs, attach them to the floor with carpet tape. Make sure that you have a light switch at the top of the stairs and the bottom of the stairs. If you do not have them, ask someone to add them for you. What else can I do to help prevent falls? Wear shoes that: Do not have high heels. Have rubber bottoms. Are comfortable and fit you well. Are closed at the toe. Do not wear sandals. If you use a stepladder: Make sure that it is fully opened. Do not climb a closed stepladder. Make sure that both sides of the stepladder are locked into place. Ask someone to hold it for you, if possible. Clearly mark and make sure that you can see: Any grab bars or handrails. First and last steps. Where the edge of each step is. Use tools that help you move around (mobility aids) if they are needed. These include: Canes. Walkers. Scooters. Crutches. Turn on the lights when you go into a dark area. Replace any light bulbs as soon as they burn out. Set up your furniture so you have a clear path. Avoid moving your furniture around. If any of your floors are uneven, fix them. If there are any pets around you, be aware of where they are. Review your medicines with your doctor. Some medicines can make you feel dizzy. This can increase your chance of falling. Ask your doctor what other things that you can do to help prevent falls. This information is not intended to replace advice given to you by your health care provider. Make sure you discuss any questions you have with your health care provider. Document Released: 09/16/2009 Document Revised: 04/27/2016 Document Reviewed: 12/25/2014 Elsevier Interactive Patient Education  2017 [employer].

## 2022-10-31 NOTE — Progress Notes (Signed)
I connected with  Paul Paul on 10/31/22 by a audio enabled telemedicine application and verified that I am speaking with the correct person using two identifiers.  Patient Location: Home  Provider Location: Office/Clinic  I discussed the limitations of evaluation and management by telemedicine. The patient expressed understanding and agreed to proceed.   Subjective:   Paul Paul is a 66 y.o. male who presents for Medicare Annual/Subsequent preventive examination.  Review of Systems     Cardiac Risk Factors include: advanced age (>78men, >19 women)     Objective:    Today's Vitals   10/31/22 1254  Weight: 257 lb (116.6 kg)   Body mass index is 32.12 kg/m.     10/31/2022   12:58 PM 02/23/2022    7:40 AM 10/24/2021    3:30 PM 10/11/2020    3:11 PM 10/02/2019    2:14 PM 01/02/2019    6:43 AM 02/13/2018    8:50 PM  Advanced Directives  Does Patient Have a Medical Advance Directive? No No No No Yes No No  Type of Advance Directive     Living will;Healthcare Power of Attorney    Does patient want to make changes to medical advance directive?     No - Patient declined    Copy of Healthcare Power of Attorney in Chart?     No - copy requested    Would patient like information on creating a medical advance directive? No - Patient declined No - Patient declined No - Patient declined No - Patient declined  No - Patient declined No - Patient declined    Current Medications (verified) Outpatient Encounter Medications as of 10/31/2022  Medication Sig   aspirin EC 81 MG tablet Take 81 mg by mouth daily.   atorvastatin (LIPITOR) 80 MG tablet Take 1 tablet (80 mg total) by mouth daily.   glimepiride (AMARYL) 4 MG tablet TAKE 2 TABLETS DAILY BEFORE BREAKFAST.   insulin aspart protamine - aspart (NOVOLOG 70/30 FLEXPEN) (70-30) 100 UNIT/ML FlexPen Inject 5-20 Units into the skin 2 (two) times daily.   Insulin Pen Needle 31G X 8 MM MISC 1 each by Does not apply route 2 (two) times daily  before a meal.   metoprolol tartrate (LOPRESSOR) 25 MG tablet TAKE 1 TABLET EVERY DAY   nitroGLYCERIN (NITROSTAT) 0.4 MG SL tablet Place 1 tablet (0.4 mg total) under the tongue every 5 (five) minutes as needed for chest pain (up to 3 doses. call for help if doesnt resolve after 1 dose.).   [DISCONTINUED] azithromycin (ZITHROMAX) 250 MG tablet Take 2 tabs today, then 1 tab daily for 4 days   No facility-administered encounter medications on file as of 10/31/2022.    Allergies (verified) Doxycycline, Lisinopril, Losartan potassium-hctz, and Prednisone   History: Past Medical History:  Diagnosis Date   Anxiety    Arthritis    Bell palsy    > 30 years. Left side of face droops a little   Bradycardia    Cerebral aneurysm    Cerebral aneurysm without rupture 06/2011   Middle of Cerebral Artery   Complication of anesthesia 2012   after surgery for cliping of cerebral anerysum- In the room, patient was shaking, and patient would awaken and then "go back out" shaking continued after he "went back out:   Constipation    Diabetes mellitus    now under control   Dysrhythmia    BRADYCARDIA REQUIRING PACEMAKER .FOLLOWED BY GREGG TAYLOR   HYPERTENSION 12/10/2007  Pacemaker    Shoulder pain    L SHOULDER. RECENT FALL   Sleep apnea    CPAP.NOT WEARING NOW.NEEDS REPLACEMENT PIECE   Past Surgical History:  Procedure Laterality Date   CARDIAC CATHETERIZATION  07/06/11   normal   CEREBRAL ANEURYSM REPAIR  06/2011   "stenting and coiling" per patient   CORONARY STENT INTERVENTION N/A 07/21/2020   Procedure: CORONARY STENT INTERVENTION;  Surgeon: Iran Ouch, MD;  Location: [facility] INVASIVE CV LAB;  Service: Cardiovascular;  Laterality: N/A;   FRACTURE SURGERY Left    foot   INSERT / REPLACE / REMOVE PACEMAKER  2012   KNEE SURGERY Left    left; arthoscopic   LEFT HEART CATH AND CORONARY ANGIOGRAPHY N/A 07/21/2020   Procedure: LEFT HEART CATH AND CORONARY ANGIOGRAPHY;  Surgeon: Iran Ouch, MD;  Location: [facility] INVASIVE CV LAB;  Service: Cardiovascular;  Laterality: N/A;   PPM GENERATOR CHANGEOUT N/A 02/23/2022   Procedure: PPM GENERATOR CHANGEOUT;  Surgeon: Marinus Maw, MD;  Location: [facility] INVASIVE CV LAB;  Service: Cardiovascular;  Laterality: N/A;   rotator cuff surgery Left 02/2012   septal deviation     SHOULDER ARTHROSCOPY WITH SUBACROMIAL DECOMPRESSION, ROTATOR CUFF REPAIR AND BICEP TENDON REPAIR Right 08/24/2015   Procedure: RIGHT SHOULDER DIAGNOSTIC OPERATIVE ARTHROSCOPY WITH DEBRIDEMENT, SUBACROMIAL DECOMPRESSION, MIMI-OPEN ROTATOR CUFF TEAR REPAIR AND POSSIBLE  BICEPS TENODESIS.  ;  Surgeon: Cammy Copa, MD;  Location: [facility] OR;  Service: Orthopedics;  Laterality: Right;  RIGHT SHOULDER DIAGNOSTIC OPERATIVE ARTHROSCOPY, DEBRIDEMENT, SUBACROMIAL DECOMPRESSION, MINI-OPEN ROTATOR CUFF TEAR REPAIR, POSSIB   Family History  Problem Relation Age of Onset   Heart attack Mother        died 60, smoker   Heart disease Mother    Diabetes Mother    Heart attack Father        died 28, smoker   Heart disease Father    Social History   Socioeconomic History   Marital status: Married    Spouse name: Not on file   Number of children: Not on file   Years of education: Not on file   Highest education level: Not on file  Occupational History   Occupation: Disabled  Tobacco Use   Smoking status: Former    Packs/day: 1.00    Years: 20.00    Total pack years: 20.00    Types: Cigarettes    Quit date: 07/05/2011    Years since quitting: 11.3   Smokeless tobacco: Never  Vaping Use   Vaping Use: Never used  Substance and Sexual Activity   Alcohol use: No    Alcohol/week: 0.0 standard drinks of alcohol   Drug use: No   Sexual activity: Not on file  Other Topics Concern   Not on file  Social History Narrative   Married over 30 years in 2015. 1 step son. 1 grandson.       Disabled from brain aneurysm, pacemaker. Retired from [employer] years.       Hobbies:  grandson, exercise at [facility]   Social Determinants of Health   Financial Resource Strain: Low Risk  (10/31/2022)   Overall Financial Resource Strain (CARDIA)    Difficulty of Paying Living Expenses: Not hard at all  Food Insecurity: No Food Insecurity (10/31/2022)   Hunger Vital Sign    Worried About Running Out of Food in the Last Year: Never true    Ran Out of Food in the Last Year: Never true  Transportation Needs: No Transportation  Needs (10/31/2022)   PRAPARE - Administrator, Civil Service (Medical): No    Lack of Transportation (Non-Medical): No  Physical Activity: Sufficiently Active (10/31/2022)   Exercise Vital Sign    Days of Exercise per Week: 5 days    Minutes of Exercise per Session: 60 min  Stress: Stress Concern Present (10/31/2022)   [employer] of Occupational Health - Occupational Stress Questionnaire    Feeling of Stress : To some extent  Social Connections: Moderately Integrated (10/31/2022)   Social Connection and Isolation Panel [NHANES]    Frequency of Communication with Friends and Family: More than three times a week    Frequency of Social Gatherings with Friends and Family: More than three times a week    Attends Religious Services: More than 4 times per year    Active Member of [employer] or Organizations: No    Attends Engineer, structural: Never    Marital Status: Married    Tobacco Counseling Counseling given: Not Answered   Clinical Intake:  Pre-visit preparation completed: Yes  Pain : No/denies pain     BMI - recorded: 32.12 Nutritional Status: BMI > 30  Obese Nutritional Risks: None Diabetes: Yes CBG done?: Yes (250 per pt) CBG resulted in Enter/ Edit results?: No Did pt. bring in CBG monitor from home?: No  How often do you need to have someone help you when you read instructions, pamphlets, or other written materials from your doctor or pharmacy?: 1 - Never  Diabetic? Nutrition Risk Assessment:  Has the  patient had any N/V/D within the last 2 months?  No  Does the patient have any non-healing wounds?  No  Has the patient had any unintentional weight loss or weight gain?  No   Diabetes:  Is the patient diabetic?  Yes  If diabetic, was a CBG obtained today?  Yes  Did the patient bring in their glucometer from home?  No  How often do you monitor your CBG's? daily.   Financial Strains and Diabetes Management:  Are you having any financial strains with the device, your supplies or your medication? No .  Does the patient want to be seen by Chronic Care Management for management of their diabetes?  No  Would the patient like to be referred to a Nutritionist or for Diabetic Management?  No   Diabetic Exams:  Diabetic Eye Exam: Overdue for diabetic eye exam. Pt has been advised about the importance in completing this exam. Patient advised to call and schedule an eye exam. Diabetic Foot Exam: Completed 01/12/22   Interpreter Needed?: No  Information entered by :: Lanier Ensign, LPN   Activities of Daily Living    10/31/2022   12:58 PM  In your present state of health, do you have any difficulty performing the following activities:  Hearing? 0  Vision? 0  Difficulty concentrating or making decisions? 0  Walking or climbing stairs? 0  Dressing or bathing? 0  Doing errands, shopping? 0  Preparing Food and eating ? N  Using the Toilet? N  In the past six months, have you accidently leaked urine? N  Do you have problems with loss of bowel control? N  Managing your Medications? N  Managing your Finances? N  Housekeeping or managing your Housekeeping? N    Patient Care Team: Shelva Majestic, MD as PCP - General (Family Medicine) Christell Constant, MD as PCP - Cardiology (Internal Medicine) Jethro Bolus, MD as Consulting Physician (Ophthalmology) Julieanne Cotton,  MD as Consulting Physician (Interventional Radiology) Marinus Mawaylor, Gregg W, MD as Consulting Physician  (Cardiology)  Indicate any recent Medical Services you may have received from other than [facility] providers in the past year (date may be approximate).     Assessment:   This is a routine wellness examination for Paul Paul.  Hearing/Vision screen Hearing Screening - Comments:: Pt denies any hearing issues  Vision Screening - Comments:: Pt follows up with Dr Nile Riggsshapiro for annul eye exams   Dietary issues and exercise activities discussed: Current Exercise Habits: Home exercise routine, Time (Minutes): 60, Frequency (Times/Week): 6, Weekly Exercise (Minutes/Week): 360   Goals Addressed             This Visit's Progress    Patient Stated       Keep blood sugar low        Depression Screen    10/31/2022   12:56 PM 10/24/2022   11:11 AM 10/24/2022   11:02 AM 01/12/2022   10:38 AM 10/24/2021    3:29 PM 10/11/2020    3:09 PM 10/02/2019    2:00 PM  PHQ 2/9 Scores  PHQ - 2 Score 0 0 0 0 0 0 0  PHQ- 9 Score    1       Fall Risk    10/31/2022   12:58 PM 10/24/2022   11:11 AM 10/24/2022   11:02 AM 10/24/2021    3:31 PM 10/11/2020    3:12 PM  Fall Risk   Falls in the past year? 0 0 0 0 0  Number falls in past yr: 0 0 0 0 0  Injury with Fall? 0 0 0 0 0  Risk for fall due to :  No Fall Risks No Fall Risks Impaired vision Impaired vision  Follow up Falls prevention discussed Falls evaluation completed Falls evaluation completed Falls prevention discussed Falls prevention discussed    FALL RISK PREVENTION PERTAINING TO THE HOME:  Any stairs in or around the home? Yes  If so, are there any without handrails? No  Home free of loose throw rugs in walkways, pet beds, electrical cords, etc? Yes  Adequate lighting in your home to reduce risk of falls? Yes   ASSISTIVE DEVICES UTILIZED TO PREVENT FALLS:  Life alert? No  Use of a cane, walker or w/c? No  Grab bars in the bathroom? No  Shower chair or bench in shower? No  Elevated toilet seat or a handicapped toilet? No   TIMED UP AND  GO:  Was the test performed? No .  Cognitive Function: Declined at this time         Immunizations Immunization History  Administered Date(s) Administered   Fluad Quad(high Dose 65+) 10/02/2019, 09/28/2020, 01/12/2022, 09/15/2022   Influenza Split 11/06/2011, 09/05/2012   Influenza,inj,Quad PF,6+ Mos 09/24/2014, 09/18/2018   Janssen (J&J) SARS-COV-2 Vaccination 02/15/2020   Pneumococcal Conjugate-13 04/23/2018   Pneumococcal Polysaccharide-23 07/26/2012, 12/17/2020   Tdap 01/01/2012    TDAP status: Up to date  Flu Vaccine status: Up to date  Pneumococcal vaccine status: Up to date  Covid-19 vaccine status: Completed vaccines  Qualifies for Shingles Vaccine? Yes   Zostavax completed No   Shingrix Completed?: No.    Education has been provided regarding the importance of this vaccine. Patient has been advised to call insurance company to determine out of pocket expense if they have not yet received this vaccine. Advised may also receive vaccine at local pharmacy or Health Dept. Verbalized acceptance and understanding.  Screening Tests Health  Maintenance  Topic Date Due   OPHTHALMOLOGY EXAM  12/28/2017   COVID-19 Vaccine (2 - Janssen risk series) 11/09/2022 (Originally 03/14/2020)   Zoster Vaccines- Shingrix (1 of 2) 12/16/2022 (Originally 04/06/1974)   Lung Cancer Screening  11/01/2023 (Originally 04/06/2005)   Diabetic kidney evaluation - Urine ACR  01/12/2023   FOOT EXAM  01/12/2023   HEMOGLOBIN A1C  03/17/2023   Diabetic kidney evaluation - GFR measurement  09/16/2023   Medicare Annual Wellness (AWV)  11/01/2023   Pneumonia Vaccine 88+ Years old  Completed   INFLUENZA VACCINE  Completed   Hepatitis C Screening  Completed   HPV VACCINES  Aged Out   COLONOSCOPY (Pts 45-56yrs Insurance coverage will need to be confirmed)  Discontinued    Health Maintenance  Health Maintenance Due  Topic Date Due   OPHTHALMOLOGY EXAM  12/28/2017    Colorectal cancer screening: No  longer required. / discontinued   Lung Cancer Screening: (Low Dose CT Chest recommended if Age 71-80 years, 30 pack-year currently smoking OR have quit w/in 15years.) does qualify.   Lung Cancer Screening Referral: pt declined   Additional Screening:  Hepatitis C Screening: Completed 05/03/18  Vision Screening: Recommended annual ophthalmology exams for early detection of glaucoma and other disorders of the eye. Is the patient up to date with their annual eye exam?  No  Who is the provider or what is the name of the office in which the patient attends annual eye exams? Dr Nile Riggs  If pt is not established with a provider, would they like to be referred to a provider to establish care? No .   Dental Screening: Recommended annual dental exams for proper oral hygiene  Community Resource Referral / Chronic Care Management: CRR required this visit?  No   CCM required this visit?  No      Plan:     I have personally reviewed and noted the following in the patient's chart:   Medical and social history Use of alcohol, tobacco or illicit drugs  Current medications and supplements including opioid prescriptions. Patient is not currently taking opioid prescriptions. Functional ability and status Nutritional status Physical activity Advanced directives List of other physicians Hospitalizations, surgeries, and ER visits in previous 12 months Vitals Screenings to include cognitive, depression, and falls Referrals and appointments  In addition, I have reviewed and discussed with patient certain preventive protocols, quality metrics, and best practice recommendations. A written personalized care plan for preventive services as well as general preventive health recommendations were provided to patient.     Marzella Schlein, LPN   09/23/1172   Nurse Notes: Pt declined cognitive testing at this time, Pt remained alert and oriented with verbal knowledge and understanding

## 2022-11-01 NOTE — Telephone Encounter (Signed)
Please increase to 9 units in the morning and 6 units in the evening and update me in another week please

## 2022-11-01 NOTE — Telephone Encounter (Signed)
Pt called back and verbalized understanding.

## 2022-11-01 NOTE — Telephone Encounter (Signed)
Left pt a vm and informed him on the note below per Dr.Hunters request. I informed pt he can give me a call back if he has any questions

## 2022-11-06 ENCOUNTER — Ambulatory Visit (INDEPENDENT_AMBULATORY_CARE_PROVIDER_SITE_OTHER): Payer: Medicare HMO | Admitting: *Deleted

## 2022-11-06 DIAGNOSIS — I10 Essential (primary) hypertension: Secondary | ICD-10-CM

## 2022-11-06 DIAGNOSIS — E1165 Type 2 diabetes mellitus with hyperglycemia: Secondary | ICD-10-CM

## 2022-11-06 NOTE — Chronic Care Management (AMB) (Signed)
Chronic Care Management   CCM RN Visit Note  11/06/2022 Name: Paul Paul MRN: [id number] DOB: May 15, 1956  Subjective: Paul Paul is a 66 y.o. year old male who is a primary care patient of Shelva Majestic, MD. The patient was referred to the Chronic Care Management team for assistance with care management needs subsequent to provider initiation of CCM services and plan of care.    Today's Visit:  Engaged with patient by telephone for initial visit.     SDOH Interventions Today    Flowsheet Row Most Recent Value  SDOH Interventions   Food Insecurity Interventions Intervention Not Indicated  Housing Interventions Intervention Not Indicated  Transportation Interventions Intervention Not Indicated  Utilities Interventions Intervention Not Indicated  Financial Strain Interventions Intervention Not Indicated  Physical Activity Interventions Intervention Not Indicated  Stress Interventions Intervention Not Indicated  Social Connections Interventions Intervention Not Indicated         Goals Addressed             This Visit's Progress    CCM (DIABETES) EXPECTED OUTCOME:  MONITOR, SELF-MANAGE AND REDUCE SYMPTOMS OF DIABETES       Current Barriers:  Knowledge Deficits related to Diabetes management Chronic Disease Management support and education needs related to Diabetes and diet No Advanced Directives in place- pt declines Patient reports he checks CBG once daily with readings around 200, pt states " it's much better than it was"  pt feels he is making strides with blood sugar management and improvement in [facility] Patient reports he eats mostly protein and vegetables, exercises regularly at the gym several times per week Patient declines LCSW services for medical bills, states not interested Patient declines pharmacist services, states is able to afford medications at present, states he gets insulin through [employer] and other medications through [facility]  Planned  Interventions: Provided education to patient about basic DM disease process; Reviewed medications with patient and discussed importance of medication adherence;        Reviewed prescribed diet with patient hypoglycemia; Discussed plans with patient for ongoing care management follow up and provided patient with direct contact information for care management team;      Provided patient with written educational materials related to hypo and hyperglycemia and importance of correct treatment;       Advised patient, providing education and rationale, to check cbg once daily and record        call provider for findings outside established parameters;       Review of patient status, including review of consultants reports, relevant laboratory and other test results, and medications completed;       Screening for signs and symptoms of depression related to chronic disease state;        Assessed social determinant of health barriers;         Symptom Management: Take medications as prescribed   Attend all scheduled provider appointments Call pharmacy for medication refills 3-7 days in advance of running out of medications Attend church or other social activities Perform all self care activities independently  Perform IADL's (shopping, preparing meals, housekeeping, managing finances) independently Call provider office for new concerns or questions  check blood sugar at prescribed times: once daily check feet daily for cuts, sores or redness enter blood sugar readings and medication or insulin into daily log take the blood sugar log to all doctor visits take the blood sugar meter to all doctor visits trim toenails straight across fill half of plate with  vegetables limit fast food meals to no more than 1 per week manage portion size read food labels for fat, fiber, carbohydrates and portion size do heel pump exercise 2 to 3 times each day Look over education sent via My Chart-  hypoglycemia  Follow Up Plan: Telephone follow up appointment with care management team member scheduled for:  01/08/23 at 9 am       CCM (HYPERTENSION) EXPECTED OUTCOME: MONITOR, SELF-MANAGE AND REDUCE SYMPTOMS OF HYPERTENSION       Current Barriers:  Knowledge Deficits related to Hypertension management Chronic Disease Management support and education needs related to Hypertension, diet No Advanced Directives in place- pt declines Patient reports he lives with spouse, is independent in all aspects of his care, 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   Patient reports he has blood pressure cuff but does not monitor blood pressure  Planned Interventions: Evaluation of current treatment plan related to hypertension self management and patient's adherence to plan as established by provider;   Reviewed prescribed diet low sodium Reviewed medications with patient and discussed importance of compliance;  Counseled on the importance of exercise goals with target of 150 minutes per week Discussed plans with patient for ongoing care management follow up and provided patient with direct contact information for care management team; Advised patient, providing education and rationale, to monitor blood pressure daily and record, calling PCP for findings outside established parameters;  Provided education on prescribed diet low sodium;  Discussed complications of poorly controlled blood pressure such as heart disease, stroke, circulatory complications, vision complications, kidney impairment, sexual dysfunction;  Screening for signs and symptoms of depression related to chronic disease state;  Assessed social determinant of health barriers;   Symptom Management: Take medications as prescribed   Attend all scheduled provider appointments Call pharmacy for medication refills 3-7 days in advance of running out of medications Attend church or other  social activities Perform all self care activities independently  Perform IADL's (shopping, preparing meals, housekeeping, managing finances) independently Call provider office for new concerns or questions  check blood pressure weekly choose a place to take my blood pressure (home, clinic or office, retail store) write blood pressure results in a log or diary learn about high blood pressure keep a blood pressure log take blood pressure log to all doctor appointments keep all doctor appointments take medications for blood pressure exactly as prescribed eat more whole grains, fruits and vegetables, lean meats and healthy fats Look over education sent via my chart- low sodium diet Continue working out at the gym- keep up the good work!  Follow Up Plan: Telephone follow up appointment with care management team member scheduled for:  01/08/23 at 9 am          Plan:Telephone follow up appointment with care management team member scheduled for:  01/08/23 at 9 am  [facility], BSN RN Case Physicist, medical Healthcare at [employer] [phone]

## 2022-11-06 NOTE — Plan of Care (Signed)
Chronic Care Management Provider Comprehensive Care Plan    11/06/2022 Name: RAYLON LAMSON MRN: [id number] DOB: 1956/07/15  Referral to Chronic Care Management (CCM) services was placed by Provider:  Tana Conch MD on Date: 09/15/22.  Chronic Condition 1: HYPERTENSION Provider Assessment and Plan  #Hypertension S: Controlled on metoprolol 25 mg BID     BP Readings from Last 3 Encounters:  09/15/22 130/60  06/02/22 120/60  02/23/22 (!) 141/80   A/P:  Controlled. Continue current medications.    Expected Outcome/Goals Addressed This Visit (Provider CCM goals/Provider Assessment and plan  CCM (HYPERTENSION) EXPECTED OUTCOME: MONITOR, SELF-MANAGE AND REDUCE SYMPTOMS OF HYPERTENSION  Symptom Management Condition 1: Take medications as prescribed   Attend all scheduled provider appointments Call pharmacy for medication refills 3-7 days in advance of running out of medications Attend church or other social activities Perform all self care activities independently  Perform IADL's (shopping, preparing meals, housekeeping, managing finances) independently Call provider office for new concerns or questions  check blood pressure weekly choose a place to take my blood pressure (home, clinic or office, retail store) write blood pressure results in a log or diary learn about high blood pressure keep a blood pressure log take blood pressure log to all doctor appointments keep all doctor appointments take medications for blood pressure exactly as prescribed eat more whole grains, fruits and vegetables, lean meats and healthy fats Look over education sent via my chart- low sodium diet Continue working out at the gym- keep up the good work!  Chronic Condition 2: DIABETES Provider Assessment and Plan #Diabetes mellitus-poor control. Metformin intolerant- GI. Declines insulin since 2020 S: compliant with 8mg  glimepiride daily, added Jardiance 10 mg last visit (most recently went up and  stopped 2 weeks ago) Large swings in sugars in past as far as control with improved diet/exercise but usually not controlled.  CBGs- 350 this morning before sausage biscuit, mostly 200s while on jardiance now 300s off jardiance   Recent Labs       Lab Results  Component Value Date    HGBA1C 10.9 (A) 09/15/2022    HGBA1C 13.4 (H) 01/12/2022    HGBA1C 8.7 (A) 09/28/2020    A/P: diabetes improving on jardiance and glimepiride but still above goal with a1c of 10.9 and jardiance too expensive. We are going to try for relion insulin 70/30 to take twice daily before breakfast and dinner -for the first week only take 5 units before breakfast then update me with sugars- I will likely increase this to 10 units slowly before adding in 5 units before dinner- we will work our way up slowly to avoid lows -Wife expresses significant concern about affordability even with ReliOn insulin.  With affordability issues with Jardiance we will place referral to CCM-they also expressed concern about multiple medical bills and I think we could use some assistance and regular follow-up on blood sugars also with nursing team -refer to CCM all 3 disciplines today    Expected Outcome/Goals Addressed This Visit (Provider CCM goals/Provider Assessment and plan  CCM (DIABETES) EXPECTED OUTCOME:  MONITOR, SELF-MANAGE AND REDUCE SYMPTOMS OF DIABETES  Symptom Management Condition 2: Take medications as prescribed   Attend all scheduled provider appointments Call pharmacy for medication refills 3-7 days in advance of running out of medications Attend church or other social activities Perform all self care activities independently  Perform IADL's (shopping, preparing meals, housekeeping, managing finances) independently Call provider office for new concerns or questions  check blood sugar at  prescribed times: once daily check feet daily for cuts, sores or redness enter blood sugar readings and medication or insulin into  daily log take the blood sugar log to all doctor visits take the blood sugar meter to all doctor visits trim toenails straight across fill half of plate with vegetables limit fast food meals to no more than 1 per week manage portion size read food labels for fat, fiber, carbohydrates and portion size do heel pump exercise 2 to 3 times each day Look over education sent via My Chart- hypoglycemia  Problem List Patient Active Problem List   Diagnosis Date Noted   Hyperlipidemia 10/19/2020   Aortic atherosclerosis (HCC) 07/28/2020   CAD (coronary artery disease) status post drug-eluting stent mid LAD August 2021 07/28/2020   Sinus node dysfunction (HCC) 08/05/2019   Fatty liver 06/18/2018   Hepatotoxicity due to statin drug 10/19/2017   Elevated LFTs 08/09/2017   Chronic pruritic rash in adult 08/09/2017   GAD (generalized anxiety disorder) 10/11/2016   OSA on CPAP 09/24/2014   Hyperlipidemia associated with type 2 diabetes mellitus (HCC) 09/24/2014   Obesity 01/01/2014   Other specified cardiac dysrhythmias(427.89) 07/09/2012   Cerebral aneurysm without rupture 01/01/2012   Pacemaker 10/07/2011   Poorly controlled type 2 diabetes mellitus (HCC) 11/09/2008   Essential hypertension 12/10/2007    Medication Management  Current Outpatient Medications:    aspirin EC 81 MG tablet, Take 81 mg by mouth daily., Disp: , Rfl:    atorvastatin (LIPITOR) 80 MG tablet, Take 1 tablet (80 mg total) by mouth daily., Disp: 90 tablet, Rfl: 3   glimepiride (AMARYL) 4 MG tablet, TAKE 2 TABLETS DAILY BEFORE BREAKFAST., Disp: 180 tablet, Rfl: 1   insulin aspart protamine - aspart (NOVOLOG 70/30 FLEXPEN) (70-30) 100 UNIT/ML FlexPen, Inject 5-20 Units into the skin 2 (two) times daily., Disp: 15 mL, Rfl: 11   Insulin Pen Needle 31G X 8 MM MISC, 1 each by Does not apply route 2 (two) times daily before a meal., Disp: 100 each, Rfl: 11   metoprolol tartrate (LOPRESSOR) 25 MG tablet, TAKE 1 TABLET EVERY  DAY, Disp: 90 tablet, Rfl: 2   nitroGLYCERIN (NITROSTAT) 0.4 MG SL tablet, Place 1 tablet (0.4 mg total) under the tongue every 5 (five) minutes as needed for chest pain (up to 3 doses. call for help if doesnt resolve after 1 dose.)., Disp: 30 tablet, Rfl: 3  Cognitive Assessment Identity Confirmed: : Name; DOB Cognitive Status: Normal   Functional Assessment Hearing Difficulty or Deaf: no Wear Glasses or Blind: yes Vision Management: can see well w/ glasses Concentrating, Remembering or Making Decisions Difficulty (CP): no Difficulty Communicating: no Difficulty Eating/Swallowing: no Walking or Climbing Stairs Difficulty: no Dressing/Bathing Difficulty: no Doing Errands Independently Difficulty (such as shopping) (CP): no   Caregiver Assessment  Primary Source of Support/Comfort: spouse Name of Support/Comfort Primary Source: Clearnce Leja   spouse People in Home: spouse Name(s) of People in Home: Audy Dauphine   Planned Interventions  Evaluation of current treatment plan related to hypertension self management and patient's adherence to plan as established by provider;   Reviewed prescribed diet low sodium Reviewed medications with patient and discussed importance of compliance;  Counseled on the importance of exercise goals with target of 150 minutes per week Discussed plans with patient for ongoing care management follow up and provided patient with direct contact information for care management team; Advised patient, providing education and rationale, to monitor blood pressure daily and record, calling PCP for findings  outside established parameters;  Provided education on prescribed diet low sodium;  Discussed complications of poorly controlled blood pressure such as heart disease, stroke, circulatory complications, vision complications, kidney impairment, sexual dysfunction;  Screening for signs and symptoms of depression related to chronic disease state;  Assessed  social determinant of health barriers;  Provided education to patient about basic DM disease process; Reviewed medications with patient and discussed importance of medication adherence;        Reviewed prescribed diet with patient hypoglycemia; Discussed plans with patient for ongoing care management follow up and provided patient with direct contact information for care management team;      Provided patient with written educational materials related to hypo and hyperglycemia and importance of correct treatment;       Advised patient, providing education and rationale, to check cbg once daily and record        call provider for findings outside established parameters;       Review of patient status, including review of consultants reports, relevant laboratory and other test results, and medications completed;       Screening for signs and symptoms of depression related to chronic disease state;        Assessed social determinant of health barriers;         Interaction and coordination with outside resources, practitioners, and providers See CCM Referral  Care Plan: Available in MyChart

## 2022-11-06 NOTE — Patient Instructions (Signed)
Please call the care guide team at [phone] if you need to cancel or reschedule your appointment.   If you are experiencing a Mental Health or [place] or need someone to talk to, please call the Suicide and Crisis Lifeline: 988 call the [place] National Suicide Prevention Lifeline: 813-118-7896 or TTY: 619-012-0383 TTY 4588148611) to talk to a trained counselor call 1-800-273-TALK (toll free, 24 hour hotline) go to [facility] [address], [place] [phone]) call 911   Following is a copy of your full provider care plan:   Goals Addressed             This Visit's Progress    CCM (DIABETES) EXPECTED OUTCOME:  MONITOR, SELF-MANAGE AND REDUCE SYMPTOMS OF DIABETES       Current Barriers:  Knowledge Deficits related to Diabetes management Chronic Disease Management support and education needs related to Diabetes and diet No Advanced Directives in place- pt declines Patient reports he checks CBG once daily with readings around 200, pt states " it's much better than it was"  pt feels he is making strides with blood sugar management and improvement in [facility] Patient reports he eats mostly protein and vegetables, exercises regularly at the gym several times per week Patient declines LCSW services for medical bills, states not interested Patient declines pharmacist services, states is able to afford medications at present, states he gets insulin through [employer] and other medications through [place]  Planned Interventions: Provided education to patient about basic DM disease process; Reviewed medications with patient and discussed importance of medication adherence;        Reviewed prescribed diet with patient hypoglycemia; Discussed plans with patient for ongoing care management follow up and provided patient with direct contact information for care management team;      Provided patient with written educational materials  related to hypo and hyperglycemia and importance of correct treatment;       Advised patient, providing education and rationale, to check cbg once daily and record        call provider for findings outside established parameters;       Review of patient status, including review of consultants reports, relevant laboratory and other test results, and medications completed;       Screening for signs and symptoms of depression related to chronic disease state;        Assessed social determinant of health barriers;         Symptom Management: Take medications as prescribed   Attend all scheduled provider appointments Call pharmacy for medication refills 3-7 days in advance of running out of medications Attend church or other social activities Perform all self care activities independently  Perform IADL's (shopping, preparing meals, housekeeping, managing finances) independently Call provider office for new concerns or questions  check blood sugar at prescribed times: once daily check feet daily for cuts, sores or redness enter blood sugar readings and medication or insulin into daily log take the blood sugar log to all doctor visits take the blood sugar meter to all doctor visits trim toenails straight across fill half of plate with vegetables limit fast food meals to no more than 1 per week manage portion size read food labels for fat, fiber, carbohydrates and portion size do heel pump exercise 2 to 3 times each day Look over education sent via My Chart- hypoglycemia  Follow Up Plan: Telephone follow up appointment with care management team member scheduled for:  01/08/23 at 9 am  CCM (HYPERTENSION) EXPECTED OUTCOME: MONITOR, SELF-MANAGE AND REDUCE SYMPTOMS OF HYPERTENSION       Current Barriers:  Knowledge Deficits related to Hypertension management Chronic Disease Management support and education needs related to Hypertension, diet No Advanced Directives in place- pt  declines Patient reports he lives with spouse, is independent in all aspects of his care, 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   Patient reports he has blood pressure cuff but does not monitor blood pressure  Planned Interventions: Evaluation of current treatment plan related to hypertension self management and patient's adherence to plan as established by provider;   Reviewed prescribed diet low sodium Reviewed medications with patient and discussed importance of compliance;  Counseled on the importance of exercise goals with target of 150 minutes per week Discussed plans with patient for ongoing care management follow up and provided patient with direct contact information for care management team; Advised patient, providing education and rationale, to monitor blood pressure daily and record, calling PCP for findings outside established parameters;  Provided education on prescribed diet low sodium;  Discussed complications of poorly controlled blood pressure such as heart disease, stroke, circulatory complications, vision complications, kidney impairment, sexual dysfunction;  Screening for signs and symptoms of depression related to chronic disease state;  Assessed social determinant of health barriers;   Symptom Management: Take medications as prescribed   Attend all scheduled provider appointments Call pharmacy for medication refills 3-7 days in advance of running out of medications Attend church or other social activities Perform all self care activities independently  Perform IADL's (shopping, preparing meals, housekeeping, managing finances) independently Call provider office for new concerns or questions  check blood pressure weekly choose a place to take my blood pressure (home, clinic or office, retail store) write blood pressure results in a log or diary learn about high blood pressure keep a blood pressure log take  blood pressure log to all doctor appointments keep all doctor appointments take medications for blood pressure exactly as prescribed eat more whole grains, fruits and vegetables, lean meats and healthy fats Look over education sent via my chart- low sodium diet Continue working out at the gym- keep up the good work!  Follow Up Plan: Telephone follow up appointment with care management team member scheduled for:  01/08/23 at 9 am          Patient verbalizes understanding of instructions and care plan provided today and agrees to view in [place]. Active MyChart status and patient understanding of how to access instructions and care plan via MyChart confirmed with patient.     Telephone follow up appointment with care management team member scheduled for:  01/08/23 at 9 am  Low-Sodium Eating Plan Sodium, which is an element that makes up salt, helps you maintain a healthy balance of fluids in your body. Too much sodium can increase your blood pressure and cause fluid and waste to be held in your body. Your health care provider or dietitian may recommend following this plan if you have high blood pressure (hypertension), kidney disease, liver disease, or heart failure. Eating less sodium can help lower your blood pressure, reduce swelling, and protect your heart, liver, and kidneys. What are tips for following this plan? Reading food labels The Nutrition Facts label lists the amount of sodium in one serving of the food. If you eat more than one serving, you must multiply the listed amount of sodium by the number  of servings. Choose foods with less than 140 mg of sodium per serving. Avoid foods with 300 mg of sodium or more per serving. Shopping  Look for lower-sodium products, often labeled as "low-sodium" or "no salt added." Always check the sodium content, even if foods are labeled as "unsalted" or "no salt added." Buy fresh foods. Avoid canned foods and pre-made or frozen meals. Avoid  canned, cured, or processed meats. Buy breads that have less than 80 mg of sodium per slice. Cooking  Eat more home-cooked food and less restaurant, buffet, and fast food. Avoid adding salt when cooking. Use salt-free seasonings or herbs instead of table salt or sea salt. Check with your health care provider or pharmacist before using salt substitutes. Cook with plant-based oils, such as canola, sunflower, or olive oil. Meal planning When eating at a restaurant, ask that your food be prepared with less salt or no salt, if possible. Avoid dishes labeled as brined, pickled, cured, smoked, or made with soy sauce, miso, or teriyaki sauce. Avoid foods that contain MSG (monosodium glutamate). MSG is sometimes added to [place] food, bouillon, and some canned foods. Make meals that can be grilled, baked, poached, roasted, or steamed. These are generally made with less sodium. General information Most people on this plan should limit their sodium intake to 1,500-2,000 mg (milligrams) of sodium each day. What foods should I eat? Fruits Fresh, frozen, or canned fruit. Fruit juice. Vegetables Fresh or frozen vegetables. "No salt added" canned vegetables. "No salt added" tomato sauce and paste. Low-sodium or reduced-sodium tomato and vegetable juice. Grains Low-sodium cereals, including oats, puffed wheat and rice, and shredded wheat. Low-sodium crackers. Unsalted rice. Unsalted pasta. Low-sodium bread. Whole-grain breads and whole-grain pasta. Meats and other proteins Fresh or frozen (no salt added) meat, poultry, seafood, and fish. Low-sodium canned tuna and salmon. Unsalted nuts. Dried peas, beans, and lentils without added salt. Unsalted canned beans. Eggs. Unsalted nut butters. Dairy Milk. Soy milk. Cheese that is naturally low in sodium, such as ricotta cheese, fresh mozzarella, or Swiss cheese. Low-sodium or reduced-sodium cheese. Cream cheese. Yogurt. Seasonings and condiments Fresh and dried  herbs and spices. Salt-free seasonings. Low-sodium mustard and ketchup. Sodium-free salad dressing. Sodium-free light mayonnaise. Fresh or refrigerated horseradish. Lemon juice. Vinegar. Other foods Homemade, reduced-sodium, or low-sodium soups. Unsalted popcorn and pretzels. Low-salt or salt-free chips. The items listed above may not be a complete list of foods and beverages you can eat. Contact a dietitian for more information. What foods should I avoid? Vegetables Sauerkraut, pickled vegetables, and relishes. Olives. [place] fries. Onion rings. Regular canned vegetables (not low-sodium or reduced-sodium). Regular canned tomato sauce and paste (not low-sodium or reduced-sodium). Regular tomato and vegetable juice (not low-sodium or reduced-sodium). Frozen vegetables in sauces. Grains Instant hot cereals. Bread stuffing, pancake, and biscuit mixes. Croutons. Seasoned rice or pasta mixes. Noodle soup cups. Boxed or frozen macaroni and cheese. Regular salted crackers. Self-rising flour. Meats and other proteins Meat or fish that is salted, canned, smoked, spiced, or pickled. Precooked or cured meat, such as sausages or meat loaves. Berniece Salines. Ham. Pepperoni. Hot dogs. Corned beef. Chipped beef. Salt pork. Jerky. Pickled herring. Anchovies and sardines. Regular canned tuna. Salted nuts. Dairy Processed cheese and cheese spreads. Hard cheeses. Cheese curds. Blue cheese. Feta cheese. String cheese. Regular cottage cheese. Buttermilk. Canned milk. Fats and oils Salted butter. Regular margarine. Ghee. Bacon fat. Seasonings and condiments Onion salt, garlic salt, seasoned salt, table salt, and sea salt. Canned and packaged gravies. Worcestershire sauce.  Tartar sauce. Barbecue sauce. Teriyaki sauce. Soy sauce, including reduced-sodium. Steak sauce. Fish sauce. Oyster sauce. Cocktail sauce. Horseradish that you find on the shelf. Regular ketchup and mustard. Meat flavorings and tenderizers. Bouillon cubes. Hot  sauce. Pre-made or packaged marinades. Pre-made or packaged taco seasonings. Relishes. Regular salad dressings. Salsa. Other foods Salted popcorn and pretzels. Corn chips and puffs. Potato and tortilla chips. Canned or dried soups. Pizza. Frozen entrees and pot pies. The items listed above may not be a complete list of foods and beverages you should avoid. Contact a dietitian for more information. Summary Eating less sodium can help lower your blood pressure, reduce swelling, and protect your heart, liver, and kidneys. Most people on this plan should limit their sodium intake to 1,500-2,000 mg (milligrams) of sodium each day. Canned, boxed, and frozen foods are high in sodium. Restaurant foods, fast foods, and pizza are also very high in sodium. You also get sodium by adding salt to food. Try to cook at home, eat more fresh fruits and vegetables, and eat less fast food and canned, processed, or prepared foods. This information is not intended to replace advice given to you by your health care provider. Make sure you discuss any questions you have with your health care provider. Document Revised: 12/26/2019 Document Reviewed: 10/22/2019 Elsevier Patient Education  [place]. Hypoglycemia Hypoglycemia is when the sugar (glucose) level in your blood is too low. Low blood sugar can happen to people who have diabetes and people who do not have diabetes. Low blood sugar can happen quickly, and it can be an emergency. What are the causes? This condition happens most often in people who have diabetes. It may be caused by: Diabetes medicine. Not eating enough, or not eating often enough. Doing more physical activity. Drinking alcohol on an empty stomach. If you do not have diabetes, this condition may be caused by: A tumor in the pancreas. Not eating enough, or not eating for long periods at a time (fasting). A very bad infection or illness. Problems after having weight loss (bariatric)  surgery. Kidney failure or liver failure. Certain medicines. What increases the risk? This condition is more likely to develop in people who: Have diabetes and take medicines to lower their blood sugar. Abuse alcohol. Have a very bad illness. What are the signs or symptoms? Mild Hunger. Sweating and feeling clammy. Feeling dizzy or light-headed. Being sleepy or having trouble sleeping. Feeling like you may vomit (nauseous). A fast heartbeat. A headache. Blurry vision. Mood changes, such as: Being grouchy. Feeling worried or nervous (anxious). Tingling or loss of feeling (numbness) around your mouth, lips, or tongue. Moderate Confusion and poor judgment. Behavior changes. Weakness. Uneven heartbeat. Trouble with moving (coordination). Very low Very low blood sugar (severe hypoglycemia) is a medical emergency. It can cause: Fainting. Seizures. Loss of consciousness (coma). Death. How is this treated? Treating low blood sugar Low blood sugar is often treated by eating or drinking something that has sugar in it right away. The food or drink should contain 15 grams of a fast-acting carb (carbohydrate). Options include: 4 oz (120 mL) of fruit juice. 4 oz (120 mL) of regular soda (not diet soda). A few pieces of hard candy. Check food labels to see how many pieces to eat for 15 grams. 1 Tbsp (15 mL) of sugar or honey. 4 glucose tablets. 1 tube of glucose gel. Treating low blood sugar if you have diabetes If you can think clearly and swallow safely, follow the 15:15  rule: Take 15 grams of a fast-acting carb. Talk with your doctor about how much you should take. Always keep a source of fast-acting carb with you, such as: Glucose tablets (take 4 tablets). A few pieces of hard candy. Check food labels to see how many pieces to eat for 15 grams. 4 oz (120 mL) of fruit juice. 4 oz (120 mL) of regular soda (not diet soda). 1 Tbsp (15 mL) of honey or sugar. 1 tube of glucose  gel. Check your blood sugar 15 minutes after you take the carb. If your blood sugar is still at or below 70 mg/dL (3.9 mmol/L), take 15 grams of a carb again. If your blood sugar does not go above 70 mg/dL (3.9 mmol/L) after 3 tries, get help right away. After your blood sugar goes back to normal, eat a meal or a snack within 1 hour.  Treating very low blood sugar If your blood sugar is below 54 mg/dL (3 mmol/L), you have very low blood sugar, or severe hypoglycemia. This is an emergency. Get medical help right away. If you have very low blood sugar and you cannot eat or drink, you will need to be given a hormone called glucagon. A family member or friend should learn how to check your blood sugar and how to give you glucagon. Ask your doctor if you need to have an emergency glucagon kit at home. Very low blood sugar may also need to be treated in a hospital. Follow these instructions at home: General instructions Take over-the-counter and prescription medicines only as told by your doctor. Stay aware of your blood sugar as told by your doctor. If you drink alcohol: Limit how much you have to: 0-1 drink a day for women who are not pregnant. 0-2 drinks a day for men. Know how much alcohol is in your drink. In the [place] one drink equals one 12 oz bottle of beer (355 mL), one 5 oz glass of wine (148 mL), or one 1 oz glass of hard liquor (44 mL). Be sure to eat food when you drink alcohol. Know that your body absorbs alcohol quickly. This may lead to low blood sugar later. Be sure to keep checking your blood sugar. Keep all follow-up visits. If you have diabetes:  Always have a fast-acting carb (15 grams) with you to treat low blood sugar. Follow your diabetes care plan as told by your doctor. Make sure you: Know the symptoms of low blood sugar. Check your blood sugar as often as told. Always check it before and after exercise. Always check your blood sugar before you drive. Take your  medicines as told. Follow your meal plan. Eat on time. Do not skip meals. Share your diabetes care plan with: Your work or school. People you live with. Carry a card or wear jewelry that says you have diabetes. Where to find more information American Diabetes Association: www.diabetes.org Contact a doctor if: You have trouble keeping your blood sugar in your target range. You have low blood sugar often. Get help right away if: You still have symptoms after you eat or drink something that contains 15 grams of fast-acting carb, and you cannot get your blood sugar above 70 mg/dL by following the 15:15 rule. Your blood sugar is below 54 mg/dL (3 mmol/L). You have a seizure. You faint. These symptoms may be an emergency. Get help right away. Call your local emergency services (911 in the [place] Do not wait to see if the symptoms will go  away. Do not drive yourself to the hospital. Summary Hypoglycemia happens when the level of sugar (glucose) in your blood is too low. Low blood sugar can happen to people who have diabetes and people who do not have diabetes. Low blood sugar can happen quickly, and it can be an emergency. Make sure you know the symptoms of low blood sugar and know how to treat it. Always keep a source of sugar (fast-acting carb) with you to treat low blood sugar. This information is not intended to replace advice given to you by your health care provider. Make sure you discuss any questions you have with your health care provider. Document Revised: 10/21/2020 Document Reviewed: 10/21/2020 Elsevier Patient Education  [place].

## 2022-11-08 NOTE — Telephone Encounter (Signed)
Okay perfect-we will discuss then

## 2022-11-08 NOTE — Telephone Encounter (Signed)
What are updates on his sugar level?

## 2022-11-08 NOTE — Telephone Encounter (Signed)
Pt states its still 200, pt states he is scheduled to see you tomorrow afternoon.

## 2022-11-09 ENCOUNTER — Encounter: Payer: Self-pay | Admitting: Family Medicine

## 2022-11-09 ENCOUNTER — Ambulatory Visit (INDEPENDENT_AMBULATORY_CARE_PROVIDER_SITE_OTHER): Payer: Medicare HMO | Admitting: Family Medicine

## 2022-11-09 VITALS — BP 130/50 | HR 60 | Temp 98.4°F | Ht 75.0 in | Wt 256.2 lb

## 2022-11-09 DIAGNOSIS — E1165 Type 2 diabetes mellitus with hyperglycemia: Secondary | ICD-10-CM | POA: Diagnosis not present

## 2022-11-09 DIAGNOSIS — L720 Epidermal cyst: Secondary | ICD-10-CM

## 2022-11-09 DIAGNOSIS — I10 Essential (primary) hypertension: Secondary | ICD-10-CM

## 2022-11-09 DIAGNOSIS — R051 Acute cough: Secondary | ICD-10-CM | POA: Diagnosis not present

## 2022-11-09 MED ORDER — ALBUTEROL SULFATE HFA 108 (90 BASE) MCG/ACT IN AERS
2.0000 | INHALATION_SPRAY | Freq: Four times a day (QID) | RESPIRATORY_TRACT | 0 refills | Status: DC | PRN
Start: 2022-11-09 — End: 2024-05-09

## 2022-11-09 NOTE — Progress Notes (Signed)
Phone [phone] In person visit   Subjective:   Paul Paul is a 66 y.o. year old very pleasant male patient who presents for/with See problem oriented charting Chief Complaint  Patient presents with   knot on neck    Pt stated that he has a a knot on back of his neck for the past month.   Past Medical History-  Patient Active Problem List   Diagnosis Date Noted   CAD (coronary artery disease) status post drug-eluting stent mid LAD August 2021 07/28/2020    Priority: High   Sinus node dysfunction (HCC) 08/05/2019    Priority: High   Cerebral aneurysm without rupture 01/01/2012    Priority: High   Pacemaker 10/07/2011    Priority: High   Poorly controlled type 2 diabetes mellitus (HCC) 11/09/2008    Priority: High   Aortic atherosclerosis (HCC) 07/28/2020    Priority: Medium    Fatty liver 06/18/2018    Priority: Medium    Hepatotoxicity due to statin drug 10/19/2017    Priority: Medium    Elevated LFTs 08/09/2017    Priority: Medium    GAD (generalized anxiety disorder) 10/11/2016    Priority: Medium    OSA on CPAP 09/24/2014    Priority: Medium    Hyperlipidemia associated with type 2 diabetes mellitus (HCC) 09/24/2014    Priority: Medium    Other specified cardiac dysrhythmias(427.89) 07/09/2012    Priority: Medium    Essential hypertension 12/10/2007    Priority: Medium    Obesity 01/01/2014    Priority: Low   Hyperlipidemia 10/19/2020   Chronic pruritic rash in adult 08/09/2017    Medications- reviewed and updated Current Outpatient Medications  Medication Sig Dispense Refill   albuterol (VENTOLIN HFA) 108 (90 Base) MCG/ACT inhaler Inhale 2 puffs into the lungs every 6 (six) hours as needed for wheezing or shortness of breath (for bronchitis). 1 each 0   aspirin EC 81 MG tablet Take 81 mg by mouth daily.     atorvastatin (LIPITOR) 80 MG tablet Take 1 tablet (80 mg total) by mouth daily. 90 tablet 3   glimepiride (AMARYL) 4 MG tablet TAKE 2 TABLETS  DAILY BEFORE BREAKFAST. 180 tablet 1   insulin aspart protamine - aspart (NOVOLOG 70/30 FLEXPEN) (70-30) 100 UNIT/ML FlexPen Inject 5-20 Units into the skin 2 (two) times daily. 15 mL 11   Insulin Pen Needle 31G X 8 MM MISC 1 each by Does not apply route 2 (two) times daily before a meal. 100 each 11   metoprolol tartrate (LOPRESSOR) 25 MG tablet TAKE 1 TABLET EVERY DAY 90 tablet 2   nitroGLYCERIN (NITROSTAT) 0.4 MG SL tablet Place 1 tablet (0.4 mg total) under the tongue every 5 (five) minutes as needed for chest pain (up to 3 doses. call for help if doesnt resolve after 1 dose.). 30 tablet 3   No current facility-administered medications for this visit.     Objective:  BP (!) 130/50   Pulse 60   Temp 98.4 F (36.9 C)   Ht 6\' 3"  (1.905 m)   Wt 256 lb 3.2 oz (116.2 kg)   SpO2 96%   BMI 32.02 kg/m  Gen: NAD, resting comfortably CV: RRR no murmurs rubs or gallops Lungs: CTAB no crackles, wheeze, rhonchi Skin: warm, dry, midline and upper back 5 x 5 cm raised area with central head-able to open this relatively easily with a mixture of blood, purulence, cottage cheeselike discharge, capsule contents from likely epidermoid cyst  Assessment and Plan   # Knot on neck S: Present for months but worsening in the last week A/P: We were able to open this without doing a full incision and drainage and I aggressively drained to this with manipulation at the end area of induration was about 2 x 2 cm down from 5 x 5 cm-we discussed option of doing formal incision and drainage but he preferred not to do this or be referred to dermatology for full excision unless fails to improve  # cough/chest congestion S:about 2 weeks of cough- seen by Dr. Mardelle Matte on 10/24/22 was given azithromycin for possible bacterial bronchitis. Azithromycin given on that date but has not helped. Improved maybe a day or two on first portion of this then worsened. No shorntess of breath. Cough is now dry- was productive before.   A/P: ongoing cough/congestion- concern bacterial bronchitis but with lack of improvement on antibiotic could very well be viral bronchitis which antibiotics would not help. On the other hand pneumonia would cause CXR changes and would benefit from antibiotic- so I offered CXR to assess - he wants to hold off or now but will let me know.   -will trial albuterol to see if helpful at all -he will call me back if not improving in next week or sooner if worsens  #Diabetes mellitus-poor control. Metformin intolerant- GI.  S: compliant with 8mg  glimepiride daily,-NovoLog 70/30 with 9 units in the morning and 6 units at night as of 10/17/2022 but he reports taking 10 units in the morning and 7 units at night before dinner Large swings in sugars in past as far as control with improved diet/exercise.  -10/19/2022 was too costly in 2023 but was helpful  Blood sugar still over 200 A/P: Ongoing poor control of diabetes but improving-increase NovoLog 7030 to 12 units in the morning and 8 units before dinner from his current 10 units in the morning and 7 units before dinner - Asked him to update me in 1 week   #Hypertension S: Controlled on metoprolol 25 mg BID   BP Readings from Last 3 Encounters:  11/09/22 (!) 130/50  10/24/22 138/60  09/15/22 130/60   A/P: Controlled. Continue current medications.  Recommended follow up: Return for next already scheduled visit or sooner if needed. Future Appointments  Date Time Provider Department Center  01/08/2023  9:00 AM [facility]  02/22/2023 10:20 AM 02/24/2023, MD [facility]    Lab/Order associations:   ICD-10-CM   1. Poorly controlled type 2 diabetes mellitus (HCC)  E11.65     2. Essential hypertension  I10     3. Epidermoid cyst  L72.0     4. Acute cough  R05.1       Meds ordered this encounter  Medications   albuterol (VENTOLIN HFA) 108 (90 Base) MCG/ACT inhaler    Sig: Inhale 2 puffs into the lungs every 6  (six) hours as needed for wheezing or shortness of breath (for bronchitis).    Dispense:  1 each    Refill:  0    Return precautions advised.  Shelva Majestic, MD

## 2022-11-09 NOTE — Patient Instructions (Addendum)
Tdap due at pharmacy- let us know when you get this  Cyst on back that we popped and drained as best as possible  Try albuterol for cough Recommended x-ray but you declined for now- call us back if you change your mind or if symptoms do not improve in next week or so  Increase insulin to 12 units in the morning before breakfast and 8 units before dinner. Update me in a week with sugars so we can keep increasing if needd- want morning sugars under 130 as well as no low sugarse  Recommended follow up: Return for next already scheduled visit or sooner if needed.

## 2022-11-13 ENCOUNTER — Telehealth: Payer: Self-pay | Admitting: Family Medicine

## 2022-11-13 MED ORDER — AMOXICILLIN-POT CLAVULANATE 875-125 MG PO TABS: 1.0000 | ORAL_TABLET | Freq: Two times a day (BID) | ORAL | 0 refills | Status: AC

## 2022-11-13 NOTE — Telephone Encounter (Signed)
Sent in augmentin- he needs to see [place] back if this fails to improve symptoms

## 2022-11-13 NOTE — Telephone Encounter (Signed)
See below: note from OV # cough/chest congestion S:about 2 weeks of cough- seen by Dr. Mardelle Matte on 10/24/22 was given azithromycin for possible bacterial bronchitis. Azithromycin given on that date but has not helped. Improved maybe a day or two on first portion of this then worsened. No shorntess of breath. Cough is now dry- was productive before.  A/P: ongoing cough/congestion- concern bacterial bronchitis but with lack of improvement on antibiotic could very well be viral bronchitis which antibiotics would not help. On the other hand pneumonia would cause CXR changes and would benefit from antibiotic- so I offered CXR to assess - he wants to hold off or now but will let me know.   -will trial albuterol to see if helpful at all -he will call me back if not improving in next week or sooner if worsens

## 2022-11-13 NOTE — Telephone Encounter (Signed)
Pt States: -At recent visit with PCP team, he was instructed to call back if his symptoms were not improved for Antibiotic Rx.   Pt requests: -Antibiotic Rx to be sent to pharmacy.  [facility] [address] [place] - [address] [place] [address] Phone: [phone]  Fax: [phone]

## 2022-12-03 DIAGNOSIS — I10 Essential (primary) hypertension: Secondary | ICD-10-CM

## 2022-12-03 DIAGNOSIS — E1165 Type 2 diabetes mellitus with hyperglycemia: Secondary | ICD-10-CM

## 2022-12-07 NOTE — Telephone Encounter (Signed)
Is his current insulin 12 units in the morning before breakfast and 8 units before dinner?  Also please confirm no low blood sugars.  If this is his current dose and no lows can increase morning insulin to 14 units and evening insulin to 9 units and have him update me in another week

## 2022-12-07 NOTE — Telephone Encounter (Signed)
FYI

## 2022-12-07 NOTE — Telephone Encounter (Signed)
Patient states: - Blood glucose is still in the 200s even when fasting  - PCP last saw him on 11/09/22 and told him to inform him about blood glucose numbers

## 2022-12-08 NOTE — Telephone Encounter (Signed)
Contacted patient and confirmed dosage is correct with no reports of low blood sugars. Advised recommendations of increasing morning insulin to 14 units and evening insulin to 9 units. Patient will update provider again in another week with readings and verbalized understanding.

## 2022-12-19 ENCOUNTER — Telehealth: Payer: Self-pay | Admitting: Family Medicine

## 2022-12-19 DIAGNOSIS — L720 Epidermal cyst: Secondary | ICD-10-CM | POA: Diagnosis not present

## 2022-12-19 NOTE — Telephone Encounter (Signed)
FYI

## 2022-12-19 NOTE — Telephone Encounter (Signed)
As long as no lows in daytime, increase morning to 18 units and evening to 11 units and update me in 1 week.

## 2022-12-19 NOTE — Telephone Encounter (Signed)
Called and spoke with pt and pt states in the AM the dose is 15 and PM the dose is 10.

## 2022-12-19 NOTE — Telephone Encounter (Signed)
Patient states he wants Dr. Yong Channel to know that his blood sugars are still 200

## 2022-12-19 NOTE — Telephone Encounter (Signed)
Called and spoke with pt and below message given. 

## 2022-12-19 NOTE — Telephone Encounter (Signed)
Sorry-what is his current insulin dosage?

## 2022-12-19 NOTE — Telephone Encounter (Signed)
Let is his current insulin dosage both for morning and night.  I want to increase this but need to know what he is on now

## 2023-01-08 ENCOUNTER — Ambulatory Visit (INDEPENDENT_AMBULATORY_CARE_PROVIDER_SITE_OTHER): Payer: Medicare HMO | Admitting: *Deleted

## 2023-01-08 DIAGNOSIS — I10 Essential (primary) hypertension: Secondary | ICD-10-CM

## 2023-01-08 DIAGNOSIS — E119 Type 2 diabetes mellitus without complications: Secondary | ICD-10-CM

## 2023-01-08 NOTE — Chronic Care Management (AMB) (Signed)
Chronic Care Management   CCM RN Visit Note  01/08/2023 Name: Paul Paul MRN: [id number] DOB: 07/06/1956  Subjective: Paul Paul is a 67 y.o. year old male who is a primary care patient of Marin Olp, MD. The patient was referred to the Chronic Care Management team for assistance with care management needs subsequent to provider initiation of CCM services and plan of care.    Today's Visit:  Engaged with patient by telephone for follow up visit.        Goals Addressed             This Visit's Progress    CCM (DIABETES) EXPECTED OUTCOME:  MONITOR, SELF-MANAGE AND REDUCE SYMPTOMS OF DIABETES       Current Barriers:  Knowledge Deficits related to Diabetes management Chronic Disease Management support and education needs related to Diabetes and diet No Advanced Directives in place- pt declines Patient reports he checks CBG once daily with readings still around 200, pt states " it's much better than it was"  pt feels he is making strides with blood sugar management and improvement in [facility] Patient reports he eats mostly protein and vegetables, exercises regularly at the gym several times per week (4 days per week) Patient declines LCSW services for medical bills, states not interested Patient declines pharmacist services, states is able to afford medications at present, states he gets insulin through [employer] and other medications through [place] Patient reports he is still working towards his goal to decrease AIC  Planned Interventions: Reviewed medications with patient and discussed importance of medication adherence;        Reviewed prescribed diet with patient hypoglycemia; Advised patient, providing education and rationale, to check cbg once daily and record        call provider for findings outside established parameters;       Review of patient status, including review of consultants reports, relevant laboratory and other test results, and medications completed;        Advised patient to discuss any issues with blood sugar, medicaitons with provider;       Symptom Management: Take medications as prescribed   Attend all scheduled provider appointments Call pharmacy for medication refills 3-7 days in advance of running out of medications Attend church or other social activities Perform all self care activities independently  Perform IADL's (shopping, preparing meals, housekeeping, managing finances) independently Call provider office for new concerns or questions  check blood sugar at prescribed times: once daily check feet daily for cuts, sores or redness enter blood sugar readings and medication or insulin into daily log take the blood sugar log to all doctor visits take the blood sugar meter to all doctor visits trim toenails straight across fill half of plate with vegetables limit fast food meals to no more than 1 per week manage portion size read food labels for fat, fiber, carbohydrates and portion size do heel pump exercise 2 to 3 times each day Keep exercising at the gym- keep up the good work! Be mindful of carbohydrate intake- too much bread, pasta, rice, potatoes, etc at one meal will elevate blood sugar  Follow Up Plan: Telephone follow up appointment with care management team member scheduled for:  04/09/23 at 9 am       CCM (HYPERTENSION) EXPECTED OUTCOME: MONITOR, SELF-MANAGE AND REDUCE SYMPTOMS OF HYPERTENSION       Current Barriers:  Knowledge Deficits related to Hypertension management Chronic Disease Management support and education needs related to Hypertension,  diet No Advanced Directives in place- pt declines Patient reports he lives with spouse, is independent in all aspects of his care, 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   Patient reports he has blood pressure cuff but does not monitor blood pressure Patient reports he is trying to eat healthy  Planned  Interventions: Evaluation of current treatment plan related to hypertension self management and patient's adherence to plan as established by provider;   Reviewed prescribed diet low sodium Reviewed medications with patient and discussed importance of compliance;  Counseled on the importance of exercise goals with target of 150 minutes per week Discussed plans with patient for ongoing care management follow up and provided patient with direct contact information for care management team; Advised patient, providing education and rationale, to monitor blood pressure daily and record, calling PCP for findings outside established parameters;  Provided education on prescribed diet low sodium;  Discussed complications of poorly controlled blood pressure such as heart disease, stroke, circulatory complications, vision complications, kidney impairment, sexual dysfunction;   Symptom Management: Take medications as prescribed   Attend all scheduled provider appointments Call pharmacy for medication refills 3-7 days in advance of running out of medications Attend church or other social activities Perform all self care activities independently  Perform IADL's (shopping, preparing meals, housekeeping, managing finances) independently Call provider office for new concerns or questions  check blood pressure weekly choose a place to take my blood pressure (home, clinic or office, retail store) write blood pressure results in a log or diary learn about high blood pressure keep a blood pressure log take blood pressure log to all doctor appointments keep all doctor appointments take medications for blood pressure exactly as prescribed report new symptoms to your doctor eat more whole grains, fruits and vegetables, lean meats and healthy fats Follow low sodium diet- read food labels Continue working out at the gym- keep up the good work!  Follow Up Plan: Telephone follow up appointment with care  management team member scheduled for:  04/09/23 at 9 am          Plan:Telephone follow up appointment with care management team member scheduled for:  04/09/23 at 9 am  Jacqlyn Larsen [facility], BSN RN Case Manager [place] at [employer] [phone]

## 2023-01-08 NOTE — Patient Instructions (Signed)
Please call the care guide team at [phone] if you need to cancel or reschedule your appointment.   If you are experiencing a Mental Health or [place] or need someone to talk to, please call the Suicide and Crisis Lifeline: 988 call the [place] National Suicide Prevention Lifeline: (530) 623-9357 or TTY: 773-228-9265 TTY 3182096800) to talk to a trained counselor call 1-800-273-TALK (toll free, 24 hour hotline) go to [facility] [address], [place] [phone]) call the [facility]: [phone] call 911   Following is a copy of the CCM Program Consent:  CCM service includes personalized support from designated clinical staff supervised by the physician, including individualized plan of care and coordination with other care providers 24/7 contact phone numbers for assistance for urgent and routine care needs. Service will only be billed when office clinical staff spend 20 minutes or more in a month to coordinate care. Only one practitioner may furnish and bill the service in a calendar month. The patient may stop CCM services at amy time (effective at the end of the month) by phone call to the office staff. The patient will be responsible for cost sharing (co-pay) or up to 20% of the service fee (after annual deductible is met)  Following is a copy of your full provider care plan:   Goals Addressed             This Visit's Progress    CCM (DIABETES) EXPECTED OUTCOME:  MONITOR, SELF-MANAGE AND REDUCE SYMPTOMS OF DIABETES       Current Barriers:  Knowledge Deficits related to Diabetes management Chronic Disease Management support and education needs related to Diabetes and diet No Advanced Directives in place- pt declines Patient reports he checks CBG once daily with readings still around 200, pt states " it's much better than it was"  pt feels he is making strides with blood sugar management and  improvement in [facility] Patient reports he eats mostly protein and vegetables, exercises regularly at the gym several times per week (4 days per week) Patient declines LCSW services for medical bills, states not interested Patient declines pharmacist services, states is able to afford medications at present, states he gets insulin through [employer] and other medications through [place] Patient reports he is still working towards his goal to decrease AIC  Planned Interventions: Reviewed medications with patient and discussed importance of medication adherence;        Reviewed prescribed diet with patient hypoglycemia; Advised patient, providing education and rationale, to check cbg once daily and record        call provider for findings outside established parameters;       Review of patient status, including review of consultants reports, relevant laboratory and other test results, and medications completed;       Advised patient to discuss any issues with blood sugar, medicaitons with provider;       Symptom Management: Take medications as prescribed   Attend all scheduled provider appointments Call pharmacy for medication refills 3-7 days in advance of running out of medications Attend church or other social activities Perform all self care activities independently  Perform IADL's (shopping, preparing meals, housekeeping, managing finances) independently Call provider office for new concerns or questions  check blood sugar at prescribed times: once daily check feet daily for cuts, sores or redness enter blood sugar readings and medication or insulin into daily log take the blood sugar log to all doctor visits take the blood sugar  meter to all doctor visits trim toenails straight across fill half of plate with vegetables limit fast food meals to no more than 1 per week manage portion size read food labels for fat, fiber, carbohydrates and portion size do heel pump exercise 2 to 3  times each day Keep exercising at the gym- keep up the good work! Be mindful of carbohydrate intake- too much bread, pasta, rice, potatoes, etc at one meal will elevate blood sugar  Follow Up Plan: Telephone follow up appointment with care management team member scheduled for:  04/09/23 at 9 am       CCM (HYPERTENSION) EXPECTED OUTCOME: MONITOR, SELF-MANAGE AND REDUCE SYMPTOMS OF HYPERTENSION       Current Barriers:  Knowledge Deficits related to Hypertension management Chronic Disease Management support and education needs related to Hypertension, diet No Advanced Directives in place- pt declines Patient reports he lives with spouse, is independent in all aspects of his care, 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   Patient reports he has blood pressure cuff but does not monitor blood pressure Patient reports he is trying to eat healthy  Planned Interventions: Evaluation of current treatment plan related to hypertension self management and patient's adherence to plan as established by provider;   Reviewed prescribed diet low sodium Reviewed medications with patient and discussed importance of compliance;  Counseled on the importance of exercise goals with target of 150 minutes per week Discussed plans with patient for ongoing care management follow up and provided patient with direct contact information for care management team; Advised patient, providing education and rationale, to monitor blood pressure daily and record, calling PCP for findings outside established parameters;  Provided education on prescribed diet low sodium;  Discussed complications of poorly controlled blood pressure such as heart disease, stroke, circulatory complications, vision complications, kidney impairment, sexual dysfunction;   Symptom Management: Take medications as prescribed   Attend all scheduled provider appointments Call pharmacy for medication  refills 3-7 days in advance of running out of medications Attend church or other social activities Perform all self care activities independently  Perform IADL's (shopping, preparing meals, housekeeping, managing finances) independently Call provider office for new concerns or questions  check blood pressure weekly choose a place to take my blood pressure (home, clinic or office, retail store) write blood pressure results in a log or diary learn about high blood pressure keep a blood pressure log take blood pressure log to all doctor appointments keep all doctor appointments take medications for blood pressure exactly as prescribed report new symptoms to your doctor eat more whole grains, fruits and vegetables, lean meats and healthy fats Follow low sodium diet- read food labels Continue working out at the gym- keep up the good work!  Follow Up Plan: Telephone follow up appointment with care management team member scheduled for:  04/09/23 at 9 am          Patient verbalizes understanding of instructions and care plan provided today and agrees to view in [place]. Active MyChart status and patient understanding of how to access instructions and care plan via MyChart confirmed with patient.     Telephone follow up appointment with care management team member scheduled for:   04/09/23 at 9 am

## 2023-02-01 DIAGNOSIS — I10 Essential (primary) hypertension: Secondary | ICD-10-CM

## 2023-02-01 DIAGNOSIS — E119 Type 2 diabetes mellitus without complications: Secondary | ICD-10-CM

## 2023-02-22 ENCOUNTER — Ambulatory Visit (INDEPENDENT_AMBULATORY_CARE_PROVIDER_SITE_OTHER): Payer: Medicare HMO | Admitting: Family Medicine

## 2023-02-22 ENCOUNTER — Encounter: Payer: Self-pay | Admitting: Family Medicine

## 2023-02-22 VITALS — BP 130/72 | HR 61 | Temp 97.8°F | Ht 75.0 in | Wt 265.0 lb

## 2023-02-22 DIAGNOSIS — I671 Cerebral aneurysm, nonruptured: Secondary | ICD-10-CM | POA: Diagnosis not present

## 2023-02-22 DIAGNOSIS — I700 Atherosclerosis of aorta: Secondary | ICD-10-CM

## 2023-02-22 DIAGNOSIS — R351 Nocturia: Secondary | ICD-10-CM | POA: Diagnosis not present

## 2023-02-22 DIAGNOSIS — E1165 Type 2 diabetes mellitus with hyperglycemia: Secondary | ICD-10-CM

## 2023-02-22 DIAGNOSIS — I10 Essential (primary) hypertension: Secondary | ICD-10-CM | POA: Diagnosis not present

## 2023-02-22 DIAGNOSIS — I2510 Atherosclerotic heart disease of native coronary artery without angina pectoris: Secondary | ICD-10-CM | POA: Diagnosis not present

## 2023-02-22 DIAGNOSIS — E1169 Type 2 diabetes mellitus with other specified complication: Secondary | ICD-10-CM

## 2023-02-22 DIAGNOSIS — I495 Sick sinus syndrome: Secondary | ICD-10-CM

## 2023-02-22 DIAGNOSIS — Z125 Encounter for screening for malignant neoplasm of prostate: Secondary | ICD-10-CM | POA: Diagnosis not present

## 2023-02-22 DIAGNOSIS — Z0000 Encounter for general adult medical examination without abnormal findings: Secondary | ICD-10-CM | POA: Diagnosis not present

## 2023-02-22 DIAGNOSIS — E785 Hyperlipidemia, unspecified: Secondary | ICD-10-CM

## 2023-02-22 LAB — CBC WITH DIFFERENTIAL/PLATELET
Basophils Absolute: 0.1 10*3/uL (ref 0.0–0.1)
Basophils Relative: 1.5 % (ref 0.0–3.0)
Eosinophils Absolute: 0.2 10*3/uL (ref 0.0–0.7)
Eosinophils Relative: 3.4 % (ref 0.0–5.0)
HCT: 45.8 % (ref 39.0–52.0)
Hemoglobin: 15.7 g/dL (ref 13.0–17.0)
Lymphocytes Relative: 33.5 % (ref 12.0–46.0)
Lymphs Abs: 1.8 10*3/uL (ref 0.7–4.0)
MCHC: 34.3 g/dL (ref 30.0–36.0)
MCV: 88.8 fl (ref 78.0–100.0)
Monocytes Absolute: 0.4 10*3/uL (ref 0.1–1.0)
Monocytes Relative: 8.2 % (ref 3.0–12.0)
Neutro Abs: 2.9 10*3/uL (ref 1.4–7.7)
Neutrophils Relative %: 53.4 % (ref 43.0–77.0)
Platelets: 158 10*3/uL (ref 150.0–400.0)
RBC: 5.16 Mil/uL (ref 4.22–5.81)
RDW: 13.5 % (ref 11.5–15.5)
WBC: 5.4 10*3/uL (ref 4.0–10.5)

## 2023-02-22 LAB — LIPID PANEL
Cholesterol: 139 mg/dL (ref 0–200)
HDL: 49.1 mg/dL (ref >39.00)
LDL Cholesterol: 60 mg/dL (ref 0–99)
NonHDL: 90.33
Total CHOL/HDL Ratio: 3
Triglycerides: 154 mg/dL — ABNORMAL HIGH (ref 0.0–149.0)
VLDL: 30.8 mg/dL (ref 0.0–40.0)

## 2023-02-22 LAB — URINALYSIS, ROUTINE W REFLEX MICROSCOPIC
Bilirubin Urine: NEGATIVE
Hgb urine dipstick: NEGATIVE
Leukocytes,Ua: NEGATIVE
Nitrite: NEGATIVE
RBC / HPF: NONE SEEN (ref 0–?)
Specific Gravity, Urine: 1.025 (ref 1.000–1.030)
Total Protein, Urine: NEGATIVE
Urine Glucose: >=1000 — AB
Urobilinogen, UA: 2 — AB (ref 0.0–1.0)
pH: 6 (ref 5.0–8.0)

## 2023-02-22 LAB — MICROALBUMIN / CREATININE URINE RATIO
Creatinine,U: 136 mg/dL
Microalb Creat Ratio: 0.5 mg/g (ref 0.0–30.0)
Microalb, Ur: <0.7 mg/dL (ref 0.0–1.9)

## 2023-02-22 LAB — COMPREHENSIVE METABOLIC PANEL
ALT: 58 U/L — ABNORMAL HIGH (ref 0–53)
AST: 49 U/L — ABNORMAL HIGH (ref 0–37)
Albumin: 3.8 g/dL (ref 3.5–5.2)
Alkaline Phosphatase: 85 U/L (ref 39–117)
BUN: 15 mg/dL (ref 6–23)
CO2: 25 mEq/L (ref 19–32)
Calcium: 9.6 mg/dL (ref 8.4–10.5)
Chloride: 103 mEq/L (ref 96–112)
Creatinine, Ser: 0.99 mg/dL (ref 0.40–1.50)
GFR: 78.62 mL/min (ref >60.00)
Glucose, Bld: 251 mg/dL — ABNORMAL HIGH (ref 70–99)
Potassium: 4.1 mEq/L (ref 3.5–5.1)
Sodium: 135 mEq/L (ref 135–145)
Total Bilirubin: 1.2 mg/dL (ref 0.2–1.2)
Total Protein: 7.6 g/dL (ref 6.0–8.3)

## 2023-02-22 LAB — HEMOGLOBIN A1C: Hgb A1c MFr Bld: 11.9 % — ABNORMAL HIGH (ref 4.6–6.5)

## 2023-02-22 LAB — PSA: PSA: 0.62 ng/mL (ref 0.10–4.00)

## 2023-02-22 MED ORDER — NOVOLOG 70/30 FLEXPEN RELION (70-30) 100 UNIT/ML ~~LOC~~ SUPN: 20.0000 [IU] | PEN_INJECTOR | Freq: Two times a day (BID) | SUBCUTANEOUS | 11 refills | Status: DC

## 2023-02-22 NOTE — Patient Instructions (Addendum)
Call Dr. Estanislado Pandy again as well as eye doctor and dentist  Please stop by lab before you go If you have mychart- we will send your results within 3 business days of [place] receiving them.  If you do not have mychart- we will call you about results within 5 business days of [place] receiving them.  *please also note that you will see labs on mychart as soon as they post. I will later go in and write notes on them- will say "notes from Dr. Yong Channel"   Recommended follow up: Return in about 14 weeks (around 05/31/2023) for followup or sooner if needed.Schedule b4 you leave.

## 2023-02-22 NOTE — Progress Notes (Signed)
Phone: [phone]   Subjective:  Patient presents today for their annual physical. Chief complaint-noted.   See problem oriented charting- ROS- full  review of systems was completed and negative  except for: high blood sugars  The following were reviewed and entered/updated in epic: Past Medical History:  Diagnosis Date   Anxiety    Arthritis    Bell palsy    > 30 years. Left side of face droops a little   Bradycardia    Cerebral aneurysm    Cerebral aneurysm without rupture 06/2011   Middle of Cerebral Artery   Complication of anesthesia 2012   after surgery for cliping of cerebral anerysum- In the room, patient was shaking, and patient would awaken and then "go back out" shaking continued after he "went back out:   Constipation    Diabetes mellitus    now under control   Dysrhythmia    BRADYC[employer]IA REQUIRING PACEMAKER .FOLLOWED BY [facility] [place]   HYPERTENSION 12/10/2007   Pacemaker    Shoulder pain    L SHOULDER. RECENT FALL   Sleep apnea    CPAP.NOT WEARING NOW.NEEDS REPLACEMENT PIECE   Patient Active Problem List   Diagnosis Date Noted   CAD (coronary artery disease) status post drug-eluting stent mid LAD August 2021 07/28/2020    Priority: High   Sinus node dysfunction ([place]) 08/05/2019    Priority: High   Cerebral aneurysm without rupture 01/01/2012    Priority: High   Pacemaker 10/07/2011    Priority: High   Poorly controlled type 2 diabetes mellitus ([place]) 11/09/2008    Priority: High   Aortic atherosclerosis ([place]) 07/28/2020    Priority: Medium    Fatty liver 06/18/2018    Priority: Medium    Hepatotoxicity due to statin drug 10/19/2017    Priority: Medium    Elevated LFTs 08/09/2017    Priority: Medium    GAD (generalized anxiety disorder) 10/11/2016    Priority: Medium    OSA on CPAP 09/24/2014    Priority: Medium    Hyperlipidemia associated with type 2 diabetes mellitus ([place]) 09/24/2014    Priority: Medium    Other specified cardiac  dysrhythmias(427.89) 07/09/2012    Priority: Medium    Essential hypertension 12/10/2007    Priority: Medium    Obesity 01/01/2014    Priority: Low   Chronic pruritic rash in adult 08/09/2017   Past Surgical History:  Procedure Laterality Date   CARDIAC CATHETERIZATION  07/06/11   normal   CEREBRAL ANEURYSM REPAIR  06/2011   "stenting and coiling" per patient   CORONARY STENT INTERVENTION N/A 07/21/2020   Procedure: CORONARY STENT INTERVENTION;  Surgeon: Wellington Hampshire, MD;  Location: [facility];  Service: Cardiovascular;  Laterality: N/A;   FRACTURE SURGERY Left    foot   INSERT / REPLACE / REMOVE PACEMAKER  2012   KNEE SURGERY Left    left; arthoscopic   LEFT HEART CATH AND CORONARY ANGIOGRAPHY N/A 07/21/2020   Procedure: LEFT HEART CATH AND CORONARY ANGIOGRAPHY;  Surgeon: Wellington Hampshire, MD;  Location: [facility];  Service: Cardiovascular;  Laterality: N/A;   PPM GENERATOR CHANGEOUT N/A 02/23/2022   Procedure: PPM GENERATOR CHANGEOUT;  Surgeon: Evans Lance, MD;  Location: [facility];  Service: Cardiovascular;  Laterality: N/A;   rotator cuff surgery Left 02/2012   septal deviation     SHOULDER ARTHROSCOPY WITH SUBACROMIAL DECOMPRESSION, ROTATOR CUFF REPAIR AND BICEP TENDON REPAIR Right 08/24/2015   Procedure: RIGHT SHOULDER DIAGNOSTIC  OPERATIVE ARTHROSCOPY WITH DEBRIDEMENT, SUBACROMIAL DECOMPRESSION, MIMI-OPEN ROTATOR CUFF TEAR REPAIR AND POSSIBLE  BICEPS TENODESIS.  ;  Surgeon: Meredith Pel, MD;  Location: [facility];  Service: Orthopedics;  Laterality: Right;  RIGHT SHOULDER DIAGNOSTIC OPERATIVE ARTHROSCOPY, DEBRIDEMENT, SUBACROMIAL DECOMPRESSION, MINI-OPEN ROTATOR CUFF TEAR REPAIR, POSSIB    Family History  Problem Relation Age of Onset   Heart attack Mother        died 55, smoker   Heart disease Mother    Diabetes Mother    Heart attack Father        died 46, smoker   Heart disease Father     Medications- reviewed and updated Current  Outpatient Medications  Medication Sig Dispense Refill   albuterol (VENTOLIN HFA) 108 (90 Base) MCG/ACT inhaler Inhale 2 puffs into the lungs every 6 (six) hours as needed for wheezing or shortness of breath (for bronchitis). 1 each 0   aspirin EC 81 MG tablet Take 81 mg by mouth daily.     atorvastatin (LIPITOR) 80 MG tablet Take 1 tablet (80 mg total) by mouth daily. 90 tablet 3   glimepiride (AMARYL) 4 MG tablet TAKE 2 TABLETS DAILY BEFORE BREAKFAST. 180 tablet 1   insulin aspart protamine - aspart (NOVOLOG 70/30 FLEXPEN) (70-30) 100 UNIT/ML FlexPen Inject 5-20 Units into the skin 2 (two) times daily. 15 mL 11   Insulin Pen Needle 31G X 8 MM MISC 1 each by Does not apply route 2 (two) times daily before a meal. 100 each 11   metoprolol tartrate (LOPRESSOR) 25 MG tablet TAKE 1 TABLET EVERY DAY 90 tablet 2   nitroGLYCERIN (NITROSTAT) 0.4 MG SL tablet Place 1 tablet (0.4 mg total) under the tongue every 5 (five) minutes as needed for chest pain (up to 3 doses. call for help if doesnt resolve after 1 dose.). 30 tablet 3   No current facility-administered medications for this visit.    Allergies-reviewed and updated Allergies  Allergen Reactions   Doxycycline Rash and Nausea Only   Lisinopril Rash   Losartan Potassium-Hctz Other (See Comments)    Caused dizziness & fatigue   Prednisone Other (See Comments)    Raises blood sugar    Social History   Social History Narrative   Married over 30 years in 2015. 1 step son. 1 grandson.       Disabled from brain aneurysm, pacemaker. Retired from [employer] years.       Hobbies: grandson, exercise at [facility]   Objective  Objective:  BP 130/72   Pulse 61   Temp 97.8 F (36.6 C)   Ht 6\' 3"  (1.905 m)   Wt 265 lb (120.2 kg)   SpO2 95%   BMI 33.12 kg/m  Gen: NAD, resting comfortably HEENT: Mucous membranes are moist. Oropharynx normal. Tympanic membrane normal beyond ceruem partial obstruction bilaterally  Neck: no thyromegaly CV:  RRR no murmurs rubs or gallops Lungs: CTAB no crackles, wheeze, rhonchi Abdomen: soft/nontender/nondistended/normal bowel sounds. No rebound or guarding.  Ext: trace edema Skin: warm, dry Neuro: grossly normal, moves all extremities, PERRLA  Diabetic Foot Exam - Simple   Simple Foot Form Diabetic Foot exam was performed with the following findings: Yes 02/22/2023 10:53 AM  Visual Inspection No deformities, no ulcerations, no other skin breakdown bilaterally: Yes Sensation Testing Intact to touch and monofilament testing bilaterally: Yes Pulse Check Posterior Tibialis and Dorsalis pulse intact bilaterally: Yes Comments Onychomycosis        Assessment and Plan  67 y.o. male presenting  for annual physical.  Health Maintenance counseling: 1. Anticipatory guidance: Patient counseled regarding regular dental exams -advised q6 months, eye exams - need to schedule yearly,  avoiding smoking and second hand smoke , limiting alcohol to 2 beverages per day - doesn't drink, no illicit drugs .   2. Risk factor reduction:  Advised patient of need for regular exercise and diet rich and fruits and vegetables to reduce risk of heart attack and stroke.  Exercise- working out twice a day with grandson.  Diet/weight management-weight up 9 lbs from last visit- thinks gaining muscle mass-sometimes 2 a days.  Wt Readings from Last 3 Encounters:  02/22/23 265 lb (120.2 kg)  11/09/22 256 lb 3.2 oz (116.2 kg)  10/31/22 257 lb (116.6 kg)  3. Immunizations/screenings/ancillary studies- decliens covid shot, holding off on shingrix  Immunization History  Administered Date(s) Administered   Fluad Quad(high Dose 65+) 10/02/2019, 09/28/2020, 01/12/2022, 09/15/2022   Influenza Split 11/06/2011, 09/05/2012   Influenza,inj,Quad PF,6+ Mos 09/24/2014, 09/18/2018   Janssen (J&J) SARS-COV-2 Vaccination 02/15/2020   Pneumococcal Conjugate-13 04/23/2018   Pneumococcal Polysaccharide-23 07/26/2012, 12/17/2020   Tdap  01/01/2012  4. Prostate cancer screening- low risk prior trend- update psa with labs  Lab Results  Component Value Date   PSA 0.48 01/12/2022   PSA 0.40 11/01/2012   PSA 0.55 01/30/2007   5. Colon cancer screening - declines until dsicusses with DR. Deveshwar- declines cologuard 6. Skin cancer screening- no dermatologist recently other than Dr. [place] Crane for cyst. advised regular sunscreen use. Denies worrisome, changing, or new skin lesions.  7. Smoking associated screening (lung cancer screening, AAA screen 65-75, UA)- former smoker- quit 2012- erroneously said never smoekr last year. Declines AAA screen.  8. STD screening - only active with wife. Declines lung cancer screening  Status of chronic or acute concerns   #prior cough improved after last visit  %History of cerebral aneurysm without rupture. Follows with [facility] - in 2012 had 2 stents and coils.  - After seeing dr. Estanislado Pandy with IR will let me know about colonoscopy (declines for now)- he still needs to call Dr. Estanislado Pandy back- strongly encouraged. No headaches thankfully -encouraged caution with lifting or running regimen by Dr. Estanislado Pandy  #Diabetes mellitus-poor control. Metformin intolerant- GI.  S: compliant with 8mg  glimepiride daily,-NovoLog 70/30 with 20 units in the morning and 20 units before dinner.  -Jardiance was too costly in 2023 but was helpful - blood sugars- still running in the 200s, lowest at 160 A/P: diabetes is poorly controlled- increase novolog 70/30 to 24 units in the morning and continue 20 units before dinner- he will update me in 2 weeks with sugars and we can further adjust   #Hypertension S: Controlled on metoprolol 25 mg BID BP Readings from Last 3 Encounters:  02/22/23 130/72  11/09/22 (!) 130/50  10/24/22 138/60  A/P: stable- continue current medicines   #CAD  #Hyperlipidemia/elevated LFTs S: Medication: Atorvastatin 80 mg, aspirin 81 mg - mild lft elevations on labs- holding meds  unless worens- likely has fatty liver -no chest pain or shortness of breath  Lab Results  Component Value Date   ALT 67 (H) 09/15/2022   AST 58 (H) 09/15/2022   ALKPHOS 91 09/15/2022   BILITOT 1.1 09/15/2022   Lab Results  Component Value Date   CHOL 121 01/12/2022   HDL 45.20 01/12/2022   LDLCALC 58 01/12/2022   LDLDIRECT 104.0 08/09/2017   TRIG 93.0 01/12/2022   CHOLHDL 3 01/12/2022  A/P: cad asymptomatic continue  current medications - will monitor liver function with labs   #pacemaker due to history symptomatic bradycardia S: Patient follows with Dr. Lovena Le.  He has a pacemaker due to history of symptomatic bradycardia- no recent issues A/P: stable- continue current medicines   Recommended follow up: Return in about 14 weeks (around 05/31/2023) for followup or sooner if needed.Schedule b4 you leave. Future Appointments  Date Time Provider [place]  04/09/2023  9:00 AM [facility]   Lab/Order associations:NOT fasting   ICD-10-CM   1. Preventative health care  Z00.00     2. Coronary artery disease involving native coronary artery of native heart without angina pectoris  I25.10     3. Cerebral aneurysm without rupture  I67.1     4. Poorly controlled type 2 diabetes mellitus ([place])  E11.65     5. Sinus node dysfunction ([facility])  I49.5     6. Aortic atherosclerosis ([facility])  I70.0     7. Essential hypertension  I10     8. Hyperlipidemia associated with type 2 diabetes mellitus ([facility])  E11.69    E78.5     9. Nocturia  R35.1     10. Screening for prostate cancer  Z12.5       No orders of the defined types were placed in this encounter.   Return precautions advised.  Garret Reddish, MD

## 2023-02-28 ENCOUNTER — Other Ambulatory Visit: Payer: Self-pay | Admitting: Family Medicine

## 2023-03-05 ENCOUNTER — Telehealth: Payer: Self-pay | Admitting: Family Medicine

## 2023-03-05 NOTE — Telephone Encounter (Signed)
See below

## 2023-03-05 NOTE — Telephone Encounter (Signed)
*  Not to run out of insulin-my apologies

## 2023-03-05 NOTE — Telephone Encounter (Signed)
Now would not recommend extra glimepiride-would wait until he gets more insulin-strongly prefer for him not to run on insulin

## 2023-03-05 NOTE — Telephone Encounter (Signed)
Out of insulin and will get more tomorrow and wants to know if he should take extra  glimepiride (AMARYL) 4 MG tablet  Until tomorrow. Please advise.

## 2023-03-06 NOTE — Telephone Encounter (Signed)
Called and spoke with pt and below message given. 

## 2023-04-09 ENCOUNTER — Telehealth: Payer: Self-pay | Admitting: *Deleted

## 2023-04-09 ENCOUNTER — Telehealth: Payer: Medicare HMO

## 2023-04-09 NOTE — Telephone Encounter (Signed)
   CCM RN Visit Note   04/09/23 Name: Paul Paul MRN: [id number]      DOB: February 03, 1956  Subjective: Paul Paul is a 67 y.o. year old male who is a primary care patient of Tana Conch MD. The patient was referred to the Chronic Care Management team for assistance with care management needs subsequent to provider initiation of CCM services and plan of care.      An unsuccessful telephone outreach was attempted today to contact the patient about Chronic Care Management needs.    Plan:Telephone follow up appointment with care management team member scheduled for:  upon care guide rescheduling.  Irving Shows RNC, BSN RN Case Physicist, medical Healthcare at [facility] [phone]

## 2023-04-24 ENCOUNTER — Ambulatory Visit: Payer: Self-pay | Admitting: *Deleted

## 2023-04-24 ENCOUNTER — Telehealth: Payer: Self-pay

## 2023-04-24 DIAGNOSIS — I10 Essential (primary) hypertension: Secondary | ICD-10-CM

## 2023-04-24 NOTE — Chronic Care Management (AMB) (Signed)
   04/24/2023  Paul Paul 25-Sep-1956 [id number]   Message from care guide, pt declined to reschedule for follow up, care plan updated and resolved,  Case closed.  Irving Shows RNC, BSN RN Case Physicist, medical Healthcare at [facility] [phone]

## 2023-04-24 NOTE — Progress Notes (Signed)
  Chronic Care Management Note  04/24/2023 Name: Paul Paul MRN: [id number] DOB: [birth year]  Paul Paul is a 67 y.o. year old male who is a primary care patient of Shelva Majestic, MD and is actively engaged with the Chronic Care Management team. I reached out to [name] by phone today to assist with re-scheduling a follow up visit with the RN Case Manager  Follow up plan: Patient declines further follow up and engagement by the care management team. Appropriate care team members and provider have been notified via electronic communication.   [name], RMA Care Guide [facility]  [place], [place] [address] Direct Dial: [phone] [email or web address]

## 2023-04-30 ENCOUNTER — Encounter (HOSPITAL_COMMUNITY): Payer: Self-pay

## 2023-04-30 ENCOUNTER — Emergency Department (HOSPITAL_COMMUNITY): Payer: Medicare HMO

## 2023-04-30 ENCOUNTER — Emergency Department (HOSPITAL_COMMUNITY)
Admission: EM | Admit: 2023-04-30 | Discharge: 2023-04-30 | Disposition: A | Discharge status: Home / Self Care | Payer: Medicare HMO | Attending: Emergency Medicine | Admitting: Emergency Medicine

## 2023-04-30 ENCOUNTER — Telehealth: Payer: Self-pay | Admitting: Home Health

## 2023-04-30 ENCOUNTER — Other Ambulatory Visit: Payer: Self-pay

## 2023-04-30 DIAGNOSIS — Z7984 Long term (current) use of oral hypoglycemic drugs: Secondary | ICD-10-CM | POA: Insufficient documentation

## 2023-04-30 DIAGNOSIS — Z79899 Other long term (current) drug therapy: Secondary | ICD-10-CM | POA: Insufficient documentation

## 2023-04-30 DIAGNOSIS — Z7982 Long term (current) use of aspirin: Secondary | ICD-10-CM | POA: Insufficient documentation

## 2023-04-30 DIAGNOSIS — I2510 Atherosclerotic heart disease of native coronary artery without angina pectoris: Secondary | ICD-10-CM | POA: Insufficient documentation

## 2023-04-30 DIAGNOSIS — Z794 Long term (current) use of insulin: Secondary | ICD-10-CM | POA: Insufficient documentation

## 2023-04-30 DIAGNOSIS — E119 Type 2 diabetes mellitus without complications: Secondary | ICD-10-CM | POA: Insufficient documentation

## 2023-04-30 DIAGNOSIS — R0602 Shortness of breath: Secondary | ICD-10-CM | POA: Diagnosis not present

## 2023-04-30 DIAGNOSIS — R0609 Other forms of dyspnea: Secondary | ICD-10-CM | POA: Insufficient documentation

## 2023-04-30 DIAGNOSIS — R0600 Dyspnea, unspecified: Secondary | ICD-10-CM | POA: Diagnosis not present

## 2023-04-30 DIAGNOSIS — I10 Essential (primary) hypertension: Secondary | ICD-10-CM | POA: Insufficient documentation

## 2023-04-30 LAB — COMPREHENSIVE METABOLIC PANEL
ALT: 53 U/L — ABNORMAL HIGH (ref 0–44)
AST: 50 U/L — ABNORMAL HIGH (ref 15–41)
Albumin: 3 g/dL — ABNORMAL LOW (ref 3.5–5.0)
Alkaline Phosphatase: 84 U/L (ref 38–126)
Anion gap: 8 (ref 5–15)
BUN: 13 mg/dL (ref 8–23)
CO2: 23 mmol/L (ref 22–32)
Calcium: 8.9 mg/dL (ref 8.9–10.3)
Chloride: 104 mmol/L (ref 98–111)
Creatinine, Ser: 0.92 mg/dL (ref 0.61–1.24)
GFR, Estimated: >60 mL/min (ref >60)
Glucose, Bld: 278 mg/dL — ABNORMAL HIGH (ref 70–99)
Potassium: 3.9 mmol/L (ref 3.5–5.1)
Sodium: 135 mmol/L (ref 135–145)
Total Bilirubin: 1.2 mg/dL (ref 0.3–1.2)
Total Protein: 7.3 g/dL (ref 6.5–8.1)

## 2023-04-30 LAB — CBC WITH DIFFERENTIAL/PLATELET
Abs Immature Granulocytes: 0.02 10*3/uL (ref 0.00–0.07)
Basophils Absolute: 0.1 10*3/uL (ref 0.0–0.1)
Basophils Relative: 1 %
Eosinophils Absolute: 0.2 10*3/uL (ref 0.0–0.5)
Eosinophils Relative: 3 %
HCT: 43.3 % (ref 39.0–52.0)
Hemoglobin: 14.7 g/dL (ref 13.0–17.0)
Immature Granulocytes: 0 %
Lymphocytes Relative: 21 %
Lymphs Abs: 1.2 10*3/uL (ref 0.7–4.0)
MCH: 30.2 pg (ref 26.0–34.0)
MCHC: 33.9 g/dL (ref 30.0–36.0)
MCV: 88.9 fL (ref 80.0–100.0)
Monocytes Absolute: 0.4 10*3/uL (ref 0.1–1.0)
Monocytes Relative: 7 %
Neutro Abs: 3.8 10*3/uL (ref 1.7–7.7)
Neutrophils Relative %: 68 %
Platelets: 146 10*3/uL — ABNORMAL LOW (ref 150–400)
RBC: 4.87 MIL/uL (ref 4.22–5.81)
RDW: 12.8 % (ref 11.5–15.5)
WBC: 5.6 10*3/uL (ref 4.0–10.5)
nRBC: 0 % (ref 0.0–0.2)

## 2023-04-30 LAB — BRAIN NATRIURETIC PEPTIDE: B Natriuretic Peptide: 49.2 pg/mL (ref 0.0–100.0)

## 2023-04-30 LAB — TROPONIN I (HIGH SENSITIVITY)
Troponin I (High Sensitivity): 13 ng/L (ref <18)
Troponin I (High Sensitivity): 11 ng/L (ref <18)

## 2023-04-30 MED ORDER — FUROSEMIDE 20 MG PO TABS: 20.0000 mg | ORAL_TABLET | Freq: Every day | ORAL | 0 refills | Status: AC | PRN

## 2023-04-30 MED ORDER — ASPIRIN 81 MG PO CHEW
324.0000 mg | CHEWABLE_TABLET | Freq: Once | ORAL | Status: AC
  Administered 2023-04-30: 324 mg via ORAL
  Filled 2023-04-30: qty 4

## 2023-04-30 NOTE — ED Notes (Signed)
Patient transported to x-ray. ?

## 2023-04-30 NOTE — Discharge Instructions (Signed)
Your shortness of breath may be related to your heart.  There is no heart attack today but you need to follow-up closely with a cardiologist and they have set up an appointment for you on 6/3.  However, if your shortness of breath worsens, you develop chest pain, you have shortness of breath or chest pain at rest, or if you have any other new/concerning symptoms before then the need to return to the ER or call 911.

## 2023-04-30 NOTE — ED Triage Notes (Signed)
Chest pain and shortness of breath. This has been going on for approximately 1 month. Patient reports it is worse when walking for a while or climbing stairs. Hx of stent and pacemaker.

## 2023-04-30 NOTE — Progress Notes (Signed)
Asked to curbside consult on pt with DOE. Trops 13>11, BNP normal, but patinet subjectively notes abdominal swelling and worsening SOB. Suspect mild component of Hfpef. Appt made for outpatient follow up 6/3 for ED f/u. Prn lasix 20 mg recommended, Rx per Dr. Criss Alvine. I briefly discussed with patient and son, they will follow up in office. Should consider outpatient echocardiogram and/or stress testing if symptoms persist. Consider labs if patient required lasix at appt 6/3 to reassess renal function and lytes.  Parke Poisson, MD

## 2023-04-30 NOTE — ED Provider Notes (Signed)
[facility] EMERGENCY DEPARTMENT AT [facility] Provider Note   CSN: [id number] Arrival date & time: 04/30/23  7829     History  Chief Complaint  Patient presents with   Chest Pain    Paul Paul is a 67 y.o. male.  HPI 67 year old male with history of hypertension, diabetes, and CAD presents with shortness of breath.  History is from patient and son.  Over the last 2-4 weeks patient's been having exertional dyspnea.  He feels like it is progressively a little worse.  Seemed worse this morning after he walked up the stairs and he had no appetite today for breakfast which is atypical for him.  He has never had chest pain during these episodes but fairly consistently when exerting himself will notice shortness of breath.  He had a mild amount of sweating, which he's had previously in these episodes.  He is also noticed some arm tingling that is in his forearm and into his left thumb, thinks it happens with these episodes.  Currently he is asymptomatic resting in the stretcher.  No leg swelling but his son and the patient feels like he is gaining some weight in his abdominal area over the last couple weeks.  Home Medications Prior to Admission medications   Medication Sig Start Date End Date Taking? Authorizing Provider  aspirin EC 81 MG tablet Take 81 mg by mouth daily.   Yes [provider]  atorvastatin (LIPITOR) 80 MG tablet Take 1 tablet (80 mg total) by mouth daily. 09/15/22  Yes Shelva Majestic, MD  furosemide (LASIX) 20 MG tablet Take 1 tablet (20 mg total) by mouth daily as needed for fluid or edema. 04/30/23  Yes Pricilla Loveless, MD  glimepiride (AMARYL) 4 MG tablet TAKE 2 TABLETS EVERY DAY BEFORE BREAKFAST Patient taking differently: Take 4 mg by mouth daily with breakfast. 02/28/23  Yes Shelva Majestic, MD  insulin aspart protamine - aspart (NOVOLOG 70/30 FLEXPEN) (70-30) 100 UNIT/ML FlexPen Inject 20-30 Units into the skin 2 (two) times daily. 02/22/23  Yes  Shelva Majestic, MD  Insulin Pen Needle 31G X 8 MM MISC 1 each by Does not apply route 2 (two) times daily before a meal. 09/15/22  Yes Shelva Majestic, MD  metoprolol tartrate (LOPRESSOR) 25 MG tablet TAKE 1 TABLET EVERY DAY 09/15/22  Yes Marinus Maw, MD  nitroGLYCERIN (NITROSTAT) 0.4 MG SL tablet Place 1 tablet (0.4 mg total) under the tongue every 5 (five) minutes as needed for chest pain (up to 3 doses. call for help if doesnt resolve after 1 dose.). 07/28/20  Yes Shelva Majestic, MD  albuterol (VENTOLIN HFA) 108 (90 Base) MCG/ACT inhaler Inhale 2 puffs into the lungs every 6 (six) hours as needed for wheezing or shortness of breath (for bronchitis). Patient not taking: Reported on 04/30/2023 11/09/22   Shelva Majestic, MD      Allergies    Doxycycline, Lisinopril, and Losartan potassium-hctz    Review of Systems   Review of Systems  Constitutional:  Positive for appetite change, diaphoresis and unexpected weight change.  Respiratory:  Positive for shortness of breath.   Cardiovascular:  Negative for chest pain and leg swelling.  Gastrointestinal:  Negative for abdominal pain and nausea.    Physical Exam Updated Vital Signs BP (!) 151/70   Pulse 63   Temp 98.8 F (37.1 C) (Oral)   Resp 14   Ht 6\' 3"  (1.905 m)   Wt 120.2 kg  SpO2 99%   BMI 33.12 kg/m  Physical Exam Vitals and nursing note reviewed.  Constitutional:      General: He is not in acute distress.    Appearance: He is well-developed. He is not ill-appearing or diaphoretic.  HENT:     Head: Normocephalic and atraumatic.  Cardiovascular:     Rate and Rhythm: Normal rate and regular rhythm.     Pulses:          Radial pulses are 2+ on the right side and 2+ on the left side.     Heart sounds: Normal heart sounds.  Pulmonary:     Effort: Pulmonary effort is normal.     Breath sounds: Normal breath sounds.  Abdominal:     Palpations: Abdomen is soft.     Tenderness: There is no abdominal tenderness.   Musculoskeletal:     Right lower leg: No edema.     Left lower leg: No edema.  Skin:    General: Skin is warm and dry.  Neurological:     Mental Status: He is alert.     ED Results / Procedures / Treatments   Labs (all labs ordered are listed, but only abnormal results are displayed) Labs Reviewed  COMPREHENSIVE METABOLIC PANEL - Abnormal; Notable for the following components:      Result Value   Glucose, Bld 278 (*)    Albumin 3.0 (*)    AST 50 (*)    ALT 53 (*)    All other components within normal limits  CBC WITH DIFFERENTIAL/PLATELET - Abnormal; Notable for the following components:   Platelets 146 (*)    All other components within normal limits  BRAIN NATRIURETIC PEPTIDE  TROPONIN I (HIGH SENSITIVITY)  TROPONIN I (HIGH SENSITIVITY)    EKG EKG Interpretation  Date/Time:  Monday Apr 30 2023 07:26:07 EDT Ventricular Rate:  63 PR Interval:  51 QRS Duration: 103 QT Interval:  402 QTC Calculation: 412 R Axis:   -36 Text Interpretation: Sinus rhythm Short PR interval Left axis deviation Abnormal R-wave progression, late transition Borderline T abnormalities, inferior leads anterior T waves now normal when compared to 2021 Confirmed by Pricilla Loveless [phone]) on 04/30/2023 7:29:10 AM  Radiology DG Chest 2 View  Result Date: 04/30/2023 CLINICAL DATA:  Dyspnea EXAM: CHEST - 2 VIEW COMPARISON:  09/01/2021 FINDINGS: Dual-chamber pacer leads from the left in stable position. Artifact from EKG leads. Normal heart size and mediastinal contours. No acute infiltrate or edema. No effusion or pneumothorax. No acute osseous findings. IMPRESSION: No active cardiopulmonary disease. Electronically Signed   By: Tiburcio Pea M.D.   On: 04/30/2023 08:06    Procedures Procedures    Medications Ordered in ED Medications  aspirin chewable tablet 324 mg (324 mg Oral Given 04/30/23 0757)    ED Course/ Medical Decision Making/ A&P                             Medical Decision  Making Amount and/or Complexity of Data Reviewed Independent Historian:     Details: Son Labs: ordered.    Details: Troponins normal x 2. Radiology: ordered and independent interpretation performed.    Details: No CHF ECG/medicine tests: ordered and independent interpretation performed.    Details: No acute ischemia  Risk OTC drugs. Prescription drug management.   Patient is asymptomatic at this time.  Discussed with cardiology, Dr. Jacques Navy, who advises that they can get him an urgent cardiology follow-up  within about a week or so.  I think this would be reasonable as he will need some form of cardiac evaluation though MI or unstable angina seems unlikely given the symptoms only occur with exertion.  She did see the patient as well briefly and advises to also give 20 mg Lasix as needed as he is complaining of some abdominal distention/weight gain though his BNP does appear to be normal.  Otherwise, I stressed that this does not rule out all heart disease and if his symptoms worsen or if he gets symptoms at rest he needs to come back to the ER for evaluation.        Final Clinical Impression(s) / ED Diagnoses Final diagnoses:  Exertional dyspnea    Rx / DC Orders ED Discharge Orders          Ordered    furosemide (LASIX) 20 MG tablet  Daily PRN        04/30/23 1113              Pricilla Loveless, MD 04/30/23 1527

## 2023-05-05 NOTE — Telephone Encounter (Signed)
error 

## 2023-05-06 NOTE — Progress Notes (Unsigned)
Cardiology Office Note:    Date:  05/07/2023   ID:  Paul Paul, Paul Paul September 22, 1956, MRN [id number]  PCP:  Shelva Majestic, MD   [facility] Providers Cardiologist:  Christell Constant, MD     Referring MD: Shelva Majestic, MD   Chief Complaint: leg swelling  History of Present Illness:    Paul Paul is a pleasant 67 y.o. male with a hx of CAD s/p anterior MI symptomatic bradycardia s/p PPM implant, HTN, morbid obesity, cerebral aneurysm s/p coiling and hyperlipidemia.   Pacemaker implant 2012 for syncope secondary to bradycardia.  NSTEMI 07/21/2020 with subsequent mLAD PCI with residual disease D1 60%, dLAD 40%.   Seen by Dr. Raynelle Jan 07/20/2021 at which time Lasix was stopped and aspirin 81 mg alone was continued.  Discussion of stopping metoprolol ensued with recommendation from Dr. Izora Ribas to resume if recurrent angina.  He underwent PPM generator change out 02/23/22 with Dr. Ladona Ridgel.  Last seen in clinic by Dr. Ladona Ridgel on 06/02/2022 at which time he was doing well.   He presented to ED on 04/30/2023 with exertional dyspnea progressively worsening over the previous 2 to 4 weeks. Seems worse the morning of arrival after walking up stairs.  He had no appetite for breakfast which is atypical for him.  Some chest pain and diaphoresis.  Noticed arm tingling in the forearm and his son thought his abdomen had gotten bigger. Hs trops normal at 13 >> 11. BNP was normal at 49. No evidence of acute concerns. Cardiology curbside consult by Dr. Jacques Navy who recommended Lasix 20 mg daily with soon follow-up.  Today, he is here for evaluation and is accompanied by his wife. He reports no further episodes of shortness of breath with walking. Walked in today from parking lot with no symptoms. Was concerned on day of ED visit additionally about some left arm pain which has resolved. Symptom prior to NSTEMI 07/2020 was chest pain.  Reports he continues to workout.  Has mild bilateral LE  edema. He denies chest pain, shortness of breath, DOE, palpitations, orthopnea, PND. Has not taken Lasix due to concern about frequent urination. His wife and his son think his abdomen looks distended. He has a large umbilical hernia. Admits to eating bacon and sausage frequently. His wife says he drinks a large amount of soda and will not eat a meal without meat.   Past Medical History:  Diagnosis Date   Anxiety    Arthritis    Bell palsy    > 30 years. Left side of face droops a little   Bradycardia    Cerebral aneurysm    Cerebral aneurysm without rupture 06/2011   Middle of Cerebral Artery   Complication of anesthesia 2012   after surgery for cliping of cerebral anerysum- In the room, patient was shaking, and patient would awaken and then "go back out" shaking continued after he "went back out:   Constipation    Diabetes mellitus    now under control   Dysrhythmia    BRADYCARDIA REQUIRING PACEMAKER .FOLLOWED BY GREGG TAYLOR   HYPERTENSION 12/10/2007   Pacemaker    Shoulder pain    L SHOULDER. RECENT FALL   Sleep apnea    CPAP.NOT WEARING NOW.NEEDS REPLACEMENT PIECE    Past Surgical History:  Procedure Laterality Date   CARDIAC CATHETERIZATION  07/06/11   normal   CEREBRAL ANEURYSM REPAIR  06/2011   "stenting and coiling" per patient   CORONARY STENT INTERVENTION N/A 07/21/2020  Procedure: CORONARY STENT INTERVENTION;  Surgeon: Iran Ouch, MD;  Location: [facility] INVASIVE CV LAB;  Service: Cardiovascular;  Laterality: N/A;   FRACTURE SURGERY Left    foot   INSERT / REPLACE / REMOVE PACEMAKER  2012   KNEE SURGERY Left    left; arthoscopic   LEFT HEART CATH AND CORONARY ANGIOGRAPHY N/A 07/21/2020   Procedure: LEFT HEART CATH AND CORONARY ANGIOGRAPHY;  Surgeon: Iran Ouch, MD;  Location: [facility] INVASIVE CV LAB;  Service: Cardiovascular;  Laterality: N/A;   PPM GENERATOR CHANGEOUT N/A 02/23/2022   Procedure: PPM GENERATOR CHANGEOUT;  Surgeon: Marinus Maw, MD;  Location: [facility]  INVASIVE CV LAB;  Service: Cardiovascular;  Laterality: N/A;   rotator cuff surgery Left 02/2012   septal deviation     SHOULDER ARTHROSCOPY WITH SUBACROMIAL DECOMPRESSION, ROTATOR CUFF REPAIR AND BICEP TENDON REPAIR Right 08/24/2015   Procedure: RIGHT SHOULDER DIAGNOSTIC OPERATIVE ARTHROSCOPY WITH DEBRIDEMENT, SUBACROMIAL DECOMPRESSION, MIMI-OPEN ROTATOR CUFF TEAR REPAIR AND POSSIBLE  BICEPS TENODESIS.  ;  Surgeon: Cammy Copa, MD;  Location: [facility] OR;  Service: Orthopedics;  Laterality: Right;  RIGHT SHOULDER DIAGNOSTIC OPERATIVE ARTHROSCOPY, DEBRIDEMENT, SUBACROMIAL DECOMPRESSION, MINI-OPEN ROTATOR CUFF TEAR REPAIR, POSSIB    Current Medications: Current Meds  Medication Sig   aspirin EC 81 MG tablet Take 81 mg by mouth daily.   atorvastatin (LIPITOR) 80 MG tablet Take 1 tablet (80 mg total) by mouth daily.   glimepiride (AMARYL) 4 MG tablet TAKE 2 TABLETS EVERY DAY BEFORE BREAKFAST   insulin aspart protamine - aspart (NOVOLOG 70/30 FLEXPEN) (70-30) 100 UNIT/ML FlexPen Inject 20-30 Units into the skin 2 (two) times daily.   Insulin Pen Needle 31G X 8 MM MISC 1 each by Does not apply route 2 (two) times daily before a meal.   metoprolol tartrate (LOPRESSOR) 25 MG tablet TAKE 1 TABLET EVERY DAY   nitroGLYCERIN (NITROSTAT) 0.4 MG SL tablet Place 1 tablet (0.4 mg total) under the tongue every 5 (five) minutes as needed for chest pain (up to 3 doses. call for help if doesnt resolve after 1 dose.).     Allergies:   Doxycycline, Lisinopril, and Losartan potassium-hctz   Social History   Socioeconomic History   Marital status: Married    Spouse name: Not on file   Number of children: Not on file   Years of education: Not on file   Highest education level: Not on file  Occupational History   Occupation: Disabled  Tobacco Use   Smoking status: Former    Packs/day: 1.00    Years: 20.00    Additional pack years: 0.00    Total pack years: 20.00    Types: Cigarettes    Quit date:  07/05/2011    Years since quitting: 11.8   Smokeless tobacco: Never  Vaping Use   Vaping Use: Never used  Substance and Sexual Activity   Alcohol use: No    Alcohol/week: 0.0 standard drinks of alcohol   Drug use: No   Sexual activity: Not on file  Other Topics Concern   Not on file  Social History Narrative   Married over 30 years in 2015. 1 step son. 1 grandson.       Disabled from brain aneurysm, pacemaker. Retired from [employer] years.       Hobbies: grandson, exercise at [facility]   Social Determinants of Health   Financial Resource Strain: Low Risk  (11/06/2022)   Overall Financial Resource Strain (CARDIA)    Difficulty of Paying Living Expenses:  Not hard at all  Food Insecurity: No Food Insecurity (11/06/2022)   Hunger Vital Sign    Worried About Running Out of Food in the Last Year: Never true    Ran Out of Food in the Last Year: Never true  Transportation Needs: No Transportation Needs (11/06/2022)   PRAPARE - Administrator, Civil Service (Medical): No    Lack of Transportation (Non-Medical): No  Physical Activity: Sufficiently Active (11/06/2022)   Exercise Vital Sign    Days of Exercise per Week: 5 days    Minutes of Exercise per Session: 60 min  Stress: No Stress Concern Present (11/06/2022)   [employer] of Occupational Health - Occupational Stress Questionnaire    Feeling of Stress : Only a little  Recent Concern: Stress - Stress Concern Present (10/31/2022)   [employer] of Occupational Health - Occupational Stress Questionnaire    Feeling of Stress : To some extent  Social Connections: Moderately Integrated (11/06/2022)   Social Connection and Isolation Panel [NHANES]    Frequency of Communication with Friends and Family: More than three times a week    Frequency of Social Gatherings with Friends and Family: More than three times a week    Attends Religious Services: More than 4 times per year    Active Member of [employer] or  Organizations: No    Attends Banker Meetings: Never    Marital Status: Married     Family History: The patient's family history includes Diabetes in his mother; Heart attack in his father and mother; Heart disease in his father and mother.  ROS:   Please see the history of present illness.    + bilateral LE edema All other systems reviewed and are negative.  Labs/Other Studies Reviewed:    The following studies were reviewed today:  LHC 07/21/20 1st Diag lesion is 60% stenosed. Mid LAD lesion is 95% stenosed. Post intervention, there is a 5% residual stenosis. A drug-eluting stent was successfully placed using a STENT RESOLUTE ONYX 2.5X26. Dist LAD lesion is 40% stenosed.   1.  Severe one-vessel coronary artery disease with 95% moderately calcified stenosis in the mid LAD which seems to be the culprit for non-ST elevation myocardial infarction.  The RCA is heavily calcified with mild diffuse disease.   2.  Mildly elevated left ventricular end-diastolic pressure at 15 mmHg. 3.  Successful angioplasty and drug-eluting stent placement to the mid LAD.   Recommendations: Dual antiplatelet therapy for at least 1 year. Aggressive treatment of risk factors. Likely discharge home tomorrow if no issues.  Echo 07/21/20 1. Left ventricular ejection fraction, by estimation, is 55 to 60%. The  left ventricle has normal function. The left ventricle has no regional  wall motion abnormalities. Left ventricular diastolic parameters were  normal.   2. Pacing wires in RA/RV. Right ventricular systolic function is normal.  The right ventricular size is normal.   3. The mitral valve is normal in structure. No evidence of mitral valve  regurgitation. No evidence of mitral stenosis.   4. The aortic valve was not well visualized. Aortic valve regurgitation  is not visualized. No aortic stenosis is present.   5. The inferior vena cava is normal in size with greater than 50%   respiratory variability, suggesting right atrial pressure of 3 mmHg.     Recent Labs: 04/30/2023: ALT 53; B Natriuretic Peptide 49.2; BUN 13; Creatinine, Ser 0.92; Hemoglobin 14.7; Platelets 146; Potassium 3.9; Sodium 135  Recent Lipid Panel  Component Value Date/Time   CHOL 139 02/22/2023 1106   TRIG 154.0 (H) 02/22/2023 1106   HDL 49.10 02/22/2023 1106   CHOLHDL 3 02/22/2023 1106   VLDL 30.8 02/22/2023 1106   LDLCALC 60 02/22/2023 1106   LDLCALC 39 09/28/2020 0927   LDLDIRECT 104.0 08/09/2017 1402     Risk Assessment/Calculations:           Physical Exam:    VS:  BP 112/62   Pulse 63   Ht 6\' 3"  (1.905 m)   Wt 266 lb 9.6 oz (120.9 kg)   SpO2 95%   BMI 33.32 kg/m     Wt Readings from Last 3 Encounters:  05/07/23 266 lb 9.6 oz (120.9 kg)  04/30/23 265 lb (120.2 kg)  02/22/23 265 lb (120.2 kg)     GEN: Obese well developed in no acute distress HEENT: Normal NECK: No JVD; No carotid bruits CARDIAC: RRR, no murmurs, rubs, gallops RESPIRATORY:  Clear to auscultation without rales, wheezing or rhonchi  ABDOMEN: Soft, non-tender, non-distended. Protuberant with umbilical hernia MUSCULOSKELETAL:  1+ pitting edema bilateral LE; No deformity. 2+ pedal pulses, equal bilaterally SKIN: Warm and dry NEUROLOGIC:  Alert and oriented x 3 PSYCHIATRIC:  Normal affect   EKG:  EKG is not ordered today.        Diagnoses:    1. Leg edema   2. Pacemaker   3. Coronary artery disease involving native coronary artery of native heart without angina pectoris   4. Primary hypertension   5. Acute heart failure with preserved ejection fraction (HFpEF) (HCC)   6. Pure hypercholesterolemia    Assessment and Plan:     Leg edema/Acute HFpEF: He has bilateral LE edema that extends up his calves. Seems unaware. Reports symptoms from 04/30/23 for which he went to ED including DOE and abdominal distention have resolved. Reports weight is stable. Volume status is difficult to assess due  to body habitus.  No evidence of JVD on exam. Lengthy discussion about diet.  Encouraged low sodium diet < 2000 mg daily.  Encouraged him to take Lasix 20 mg for 2 to 3 days to see if leg edema improves.  We will get echocardiogram for evaluation of heart function.   CAD without angina: NSTEMI with PCI/DES to mLAD 07/2020 with residual disease in 1st diag (60%) and dist LAD (40%). ED visit a few days prior with DOE. Reports this has resolved. He denies chest pain, dyspnea, or other symptoms concerning for angina. Will evaluate for wall motion abnormality on echo. Consider further ischemic evaluation if symptoms warrant.  Hyperlipidemia: LDL 60 on 02/22/23. LDL goal now 55 or lower.  Have encouraged less saturated fat and processed foods. Continue atorvastatin.  Hypertension: BP is well controlled.   PPM implant: He underwent generator change out 02/23/2022.  Last office visit with Dr. Ladona Ridgel, EP cardiologist, was 06/02/2022. There are no remote device checks on file.  Will notify device team.     Disposition: 2 months with me  Medication Adjustments/Labs and Tests Ordered: Current medicines are reviewed at length with the patient today.  Concerns regarding medicines are outlined above.  Orders Placed This Encounter  Procedures   ECHOCARDIOGRAM COMPLETE   No orders of the defined types were placed in this encounter.   Patient Instructions  Medication Instructions:   Your physician recommends that you continue on your current medications as directed. Please refer to the Current Medication list given to you today.    *If you need a refill  on your cardiac medications before your next appointment, please call your pharmacy*   Lab Work:  None ordered.  If you have labs (blood work) drawn today and your tests are completely normal, you will receive your results only by: MyChart Message (if you have MyChart) OR A paper copy in the mail If you have any lab test that is abnormal or we need  to change your treatment, we will call you to review the results.   Testing/Procedures:  Your physician has requested that you have an echocardiogram. Echocardiography is a painless test that uses sound waves to create images of your heart. It provides your doctor with information about the size and shape of your heart and how well your heart's chambers and valves are working. This procedure takes approximately one hour. There are no restrictions for this procedure. Please do NOT wear cologne,aftershave, or lotions (deodorant is allowed). Please arrive 15 minutes prior to your appointment time.    Follow-Up: At [facility], you and your health needs are our priority.  As part of our continuing mission to provide you with exceptional heart care, we have created designated Provider Care Teams.  These Care Teams include your primary Cardiologist (physician) and Advanced Practice Providers (APPs -  Physician Assistants and Nurse Practitioners) who all work together to provide you with the care you need, when you need it.  We recommend signing up for the patient portal called "MyChart".  Sign up information is provided on this After Visit Summary.  MyChart is used to connect with patients for Virtual Visits (Telemedicine).  Patients are able to view lab/test results, encounter notes, upcoming appointments, etc.  Non-urgent messages can be sent to your provider as well.   To learn more about what you can do with MyChart, go to [email or web address].    Your next appointment:   2 month(s)  Provider:   Eligha Bridegroom, NP         Other Instructions Mediterranean Diet A Mediterranean diet refers to food and lifestyle choices that are based on the traditions of countries located on the [place] Sea. It focuses on eating more fruits, vegetables, whole grains, beans, nuts, seeds, and heart-healthy fats, and eating less dairy, meat, eggs, and processed foods with added sugar, salt, and  fat. This way of eating has been shown to help prevent certain conditions and improve outcomes for people who have chronic diseases, like kidney disease and heart disease. What are tips for following this plan? Reading food labels Check the serving size of packaged foods. For foods such as rice and pasta, the serving size refers to the amount of cooked product, not dry. Check the total fat in packaged foods. Avoid foods that have saturated fat or trans fats. Check the ingredient list for added sugars, such as corn syrup. Shopping  Buy a variety of foods that offer a balanced diet, including: Fresh fruits and vegetables (produce). Grains, beans, nuts, and seeds. Some of these may be available in unpackaged forms or large amounts (in bulk). Fresh seafood. Poultry and eggs. Low-fat dairy products. Buy whole ingredients instead of prepackaged foods. Buy fresh fruits and vegetables in-season from local farmers markets. Buy plain frozen fruits and vegetables. If you do not have access to quality fresh seafood, buy precooked frozen shrimp or canned fish, such as tuna, salmon, or sardines. Stock your pantry so you always have certain foods on hand, such as olive oil, canned tuna, canned tomatoes, rice, pasta, and beans. Cooking Cook foods with  extra-virgin olive oil instead of using butter or other vegetable oils. Have meat as a side dish, and have vegetables or grains as your main dish. This means having meat in small portions or adding small amounts of meat to foods like pasta or stew. Use beans or vegetables instead of meat in common dishes like chili or lasagna. Experiment with different cooking methods. Try roasting, broiling, steaming, and sauting vegetables. Add frozen vegetables to soups, stews, pasta, or rice. Add nuts or seeds for added healthy fats and plant protein at each meal. You can add these to yogurt, salads, or vegetable dishes. Marinate fish or vegetables using olive oil, lemon  juice, garlic, and fresh herbs. Meal planning Plan to eat one vegetarian meal one day each week. Try to work up to two vegetarian meals, if possible. Eat seafood two or more times a week. Have healthy snacks readily available, such as: Vegetable sticks with hummus. Greek yogurt. Fruit and nut trail mix. Eat balanced meals throughout the week. This includes: Fruit: 2-3 servings a day. Vegetables: 4-5 servings a day. Low-fat dairy: 2 servings a day. Fish, poultry, or lean meat: 1 serving a day. Beans and legumes: 2 or more servings a week. Nuts and seeds: 1-2 servings a day. Whole grains: 6-8 servings a day. Extra-virgin olive oil: 3-4 servings a day. Limit red meat and sweets to only a few servings a month. Lifestyle  Cook and eat meals together with your family, when possible. Drink enough fluid to keep your urine pale yellow. Be physically active every day. This includes: Aerobic exercise like running or swimming. Leisure activities like gardening, walking, or housework. Get 7-8 hours of sleep each night. If recommended by your health care provider, drink red wine in moderation. This means 1 glass a day for nonpregnant women and 2 glasses a day for men. A glass of wine equals 5 oz (150 mL). What foods should I eat? Fruits Apples. Apricots. Avocado. Berries. Bananas. Cherries. Dates. Figs. Grapes. Lemons. Melon. Oranges. Peaches. Plums. Pomegranate. Vegetables Artichokes. Beets. Broccoli. Cabbage. Carrots. Eggplant. Green beans. Chard. Kale. Spinach. Onions. Leeks. Peas. Squash. Tomatoes. Peppers. Radishes. Grains Whole-grain pasta. Brown rice. Bulgur wheat. Polenta. Couscous. Whole-wheat bread. Orpah Cobb. Meats and other proteins Beans. Almonds. Sunflower seeds. Pine nuts. Peanuts. Cod. Salmon. Scallops. Shrimp. Tuna. Tilapia. Clams. Oysters. Eggs. Poultry without skin. Dairy Low-fat milk. Cheese. Greek yogurt. Fats and oils Extra-virgin olive oil. Avocado oil. Grapeseed  oil. Beverages Water. Red wine. Herbal tea. Sweets and desserts Greek yogurt with honey. Baked apples. Poached pears. Trail mix. Seasonings and condiments Basil. Cilantro. Coriander. Cumin. Mint. Parsley. Sage. Rosemary. Tarragon. Garlic. Oregano. Thyme. Pepper. Balsamic vinegar. Tahini. Hummus. Tomato sauce. Olives. Mushrooms. The items listed above may not be a complete list of foods and beverages you can eat. Contact a dietitian for more information. What foods should I limit? This is a list of foods that should be eaten rarely or only on special occasions. Fruits Fruit canned in syrup. Vegetables Deep-fried potatoes (french fries). Grains Prepackaged pasta or rice dishes. Prepackaged cereal with added sugar. Prepackaged snacks with added sugar. Meats and other proteins Beef. Pork. Lamb. Poultry with skin. Hot dogs. Tomasa Blase. Dairy Ice cream. Sour cream. Whole milk. Fats and oils Butter. Canola oil. Vegetable oil. Beef fat (tallow). Lard. Beverages Juice. Sugar-sweetened soft drinks. Beer. Liquor and spirits. Sweets and desserts Cookies. Cakes. Pies. Candy. Seasonings and condiments Mayonnaise. Pre-made sauces and marinades. The items listed above may not be a complete list of foods and  beverages you should limit. Contact a dietitian for more information. Summary The Mediterranean diet includes both food and lifestyle choices. Eat a variety of fresh fruits and vegetables, beans, nuts, seeds, and whole grains. Limit the amount of red meat and sweets that you eat. If recommended by your health care provider, drink red wine in moderation. This means 1 glass a day for nonpregnant women and 2 glasses a day for men. A glass of wine equals 5 oz (150 mL). This information is not intended to replace advice given to you by your health care provider. Make sure you discuss any questions you have with your health care provider. Document Revised: 12/26/2019 Document Reviewed:  10/23/2019 Elsevier Patient Education  2023 Elsevier Inc. Adopting a Healthy Lifestyle.   Weight: Know what a healthy weight is for you (roughly BMI <25) and aim to maintain this. You can calculate your body mass index on your smart phone  Diet: Aim for 7+ servings of fruits and vegetables daily Limit animal fats in diet for cholesterol and heart health - choose grass fed whenever available Avoid highly processed foods (fast food burgers, tacos, fried chicken, pizza, hot dogs, french fries)  Saturated fat comes in the form of butter, lard, coconut oil, margarine, partially hydrogenated oils, and fat in meat. These increase your risk of cardiovascular disease.  Use healthy plant oils, such as olive, canola, soy, corn, sunflower and peanut.  Whole foods such as fruits, vegetables and whole grains have fiber  Men need > 38 grams of fiber per day Women need > 25 grams of fiber per day  Load up on vegetables and fruits - one-half of your plate: Aim for color and variety, and remember that potatoes dont count. Go for whole grains - one-quarter of your plate: Whole wheat, barley, wheat berries, quinoa, oats, brown rice, and foods made with them. If you want pasta, go with whole wheat pasta. Protein power - one-quarter of your plate: Fish, chicken, beans, and nuts are all healthy, versatile protein sources. Limit red meat. You need carbohydrates for energy! The type of carbohydrate is more important than the amount. Choose carbohydrates such as vegetables, fruits, whole grains, beans, and nuts in the place of white rice, white pasta, potatoes (baked or fried), macaroni and cheese, cakes, cookies, and donuts.  If youre thirsty, drink water. Coffee and tea are good in moderation, but skip sugary drinks and limit milk and dairy products to one or two daily servings. Keep sugar intake at 6 teaspoons or 24 grams or LESS       Exercise: Aim for 150 min of moderate intensity exercise weekly for heart  health, and weights twice weekly for bone health Stay active - any steps are better than no steps! Aim for 7-9 hours of sleep daily          Signed, Levi Aland, NP  05/07/2023 5:09 PM    [facility]

## 2023-05-07 ENCOUNTER — Telehealth: Payer: Self-pay

## 2023-05-07 ENCOUNTER — Encounter: Payer: Self-pay | Admitting: Nurse Practitioner

## 2023-05-07 ENCOUNTER — Ambulatory Visit: Payer: Medicare HMO | Attending: Nurse Practitioner | Admitting: Nurse Practitioner

## 2023-05-07 VITALS — BP 112/62 | HR 63 | Ht 75.0 in | Wt 266.6 lb

## 2023-05-07 DIAGNOSIS — I10 Essential (primary) hypertension: Secondary | ICD-10-CM | POA: Diagnosis not present

## 2023-05-07 DIAGNOSIS — Z950 Presence of cardiac pacemaker: Secondary | ICD-10-CM | POA: Diagnosis not present

## 2023-05-07 DIAGNOSIS — E7800 Pure hypercholesterolemia, unspecified: Secondary | ICD-10-CM | POA: Diagnosis not present

## 2023-05-07 DIAGNOSIS — I5031 Acute diastolic (congestive) heart failure: Secondary | ICD-10-CM

## 2023-05-07 DIAGNOSIS — I2510 Atherosclerotic heart disease of native coronary artery without angina pectoris: Secondary | ICD-10-CM | POA: Diagnosis not present

## 2023-05-07 DIAGNOSIS — R600 Localized edema: Secondary | ICD-10-CM | POA: Diagnosis not present

## 2023-05-07 NOTE — Patient Instructions (Signed)
Medication Instructions:   Your physician recommends that you continue on your current medications as directed. Please refer to the Current Medication list given to you today.    *If you need a refill on your cardiac medications before your next appointment, please call your pharmacy*   Lab Work:  None ordered.  If you have labs (blood work) drawn today and your tests are completely normal, you will receive your results only by: MyChart Message (if you have MyChart) OR A paper copy in the mail If you have any lab test that is abnormal or we need to change your treatment, we will call you to review the results.   Testing/Procedures:  Your physician has requested that you have an echocardiogram. Echocardiography is a painless test that uses sound waves to create images of your heart. It provides your doctor with information about the size and shape of your heart and how well your heart's chambers and valves are working. This procedure takes approximately one hour. There are no restrictions for this procedure. Please do NOT wear cologne,aftershave, or lotions (deodorant is allowed). Please arrive 15 minutes prior to your appointment time.    Follow-Up: At [facility], you and your health needs are our priority.  As part of our continuing mission to provide you with exceptional heart care, we have created designated Provider Care Teams.  These Care Teams include your primary Cardiologist (physician) and Advanced Practice Providers (APPs -  Physician Assistants and Nurse Practitioners) who all work together to provide you with the care you need, when you need it.  We recommend signing up for the patient portal called "MyChart".  Sign up information is provided on this After Visit Summary.  MyChart is used to connect with patients for Virtual Visits (Telemedicine).  Patients are able to view lab/test results, encounter notes, upcoming appointments, etc.  Non-urgent messages can be  sent to your provider as well.   To learn more about what you can do with MyChart, go to [email or web address].    Your next appointment:   2 month(s)  Provider:   Eligha Bridegroom, NP         Other Instructions Mediterranean Diet A Mediterranean diet refers to food and lifestyle choices that are based on the traditions of countries located on the [place] Sea. It focuses on eating more fruits, vegetables, whole grains, beans, nuts, seeds, and heart-healthy fats, and eating less dairy, meat, eggs, and processed foods with added sugar, salt, and fat. This way of eating has been shown to help prevent certain conditions and improve outcomes for people who have chronic diseases, like kidney disease and heart disease. What are tips for following this plan? Reading food labels Check the serving size of packaged foods. For foods such as rice and pasta, the serving size refers to the amount of cooked product, not dry. Check the total fat in packaged foods. Avoid foods that have saturated fat or trans fats. Check the ingredient list for added sugars, such as corn syrup. Shopping  Buy a variety of foods that offer a balanced diet, including: Fresh fruits and vegetables (produce). Grains, beans, nuts, and seeds. Some of these may be available in unpackaged forms or large amounts (in bulk). Fresh seafood. Poultry and eggs. Low-fat dairy products. Buy whole ingredients instead of prepackaged foods. Buy fresh fruits and vegetables in-season from local farmers markets. Buy plain frozen fruits and vegetables. If you do not have access to quality fresh seafood, buy precooked frozen shrimp or canned  fish, such as tuna, salmon, or sardines. Stock your pantry so you always have certain foods on hand, such as olive oil, canned tuna, canned tomatoes, rice, pasta, and beans. Cooking Cook foods with extra-virgin olive oil instead of using butter or other vegetable oils. Have meat as a side dish, and  have vegetables or grains as your main dish. This means having meat in small portions or adding small amounts of meat to foods like pasta or stew. Use beans or vegetables instead of meat in common dishes like chili or lasagna. Experiment with different cooking methods. Try roasting, broiling, steaming, and sauting vegetables. Add frozen vegetables to soups, stews, pasta, or rice. Add nuts or seeds for added healthy fats and plant protein at each meal. You can add these to yogurt, salads, or vegetable dishes. Marinate fish or vegetables using olive oil, lemon juice, garlic, and fresh herbs. Meal planning Plan to eat one vegetarian meal one day each week. Try to work up to two vegetarian meals, if possible. Eat seafood two or more times a week. Have healthy snacks readily available, such as: Vegetable sticks with hummus. Greek yogurt. Fruit and nut trail mix. Eat balanced meals throughout the week. This includes: Fruit: 2-3 servings a day. Vegetables: 4-5 servings a day. Low-fat dairy: 2 servings a day. Fish, poultry, or lean meat: 1 serving a day. Beans and legumes: 2 or more servings a week. Nuts and seeds: 1-2 servings a day. Whole grains: 6-8 servings a day. Extra-virgin olive oil: 3-4 servings a day. Limit red meat and sweets to only a few servings a month. Lifestyle  Cook and eat meals together with your family, when possible. Drink enough fluid to keep your urine pale yellow. Be physically active every day. This includes: Aerobic exercise like running or swimming. Leisure activities like gardening, walking, or housework. Get 7-8 hours of sleep each night. If recommended by your health care provider, drink red wine in moderation. This means 1 glass a day for nonpregnant women and 2 glasses a day for men. A glass of wine equals 5 oz (150 mL). What foods should I eat? Fruits Apples. Apricots. Avocado. Berries. Bananas. Cherries. Dates. Figs. Grapes. Lemons. Melon. Oranges.  Peaches. Plums. Pomegranate. Vegetables Artichokes. Beets. Broccoli. Cabbage. Carrots. Eggplant. Green beans. Chard. Kale. Spinach. Onions. Leeks. Peas. Squash. Tomatoes. Peppers. Radishes. Grains Whole-grain pasta. Brown rice. Bulgur wheat. Polenta. Couscous. Whole-wheat bread. Orpah Cobb. Meats and other proteins Beans. Almonds. Sunflower seeds. Pine nuts. Peanuts. Cod. Salmon. Scallops. Shrimp. Tuna. Tilapia. Clams. Oysters. Eggs. Poultry without skin. Dairy Low-fat milk. Cheese. Greek yogurt. Fats and oils Extra-virgin olive oil. Avocado oil. Grapeseed oil. Beverages Water. Red wine. Herbal tea. Sweets and desserts Greek yogurt with honey. Baked apples. Poached pears. Trail mix. Seasonings and condiments Basil. Cilantro. Coriander. Cumin. Mint. Parsley. Sage. Rosemary. Tarragon. Garlic. Oregano. Thyme. Pepper. Balsamic vinegar. Tahini. Hummus. Tomato sauce. Olives. Mushrooms. The items listed above may not be a complete list of foods and beverages you can eat. Contact a dietitian for more information. What foods should I limit? This is a list of foods that should be eaten rarely or only on special occasions. Fruits Fruit canned in syrup. Vegetables Deep-fried potatoes (french fries). Grains Prepackaged pasta or rice dishes. Prepackaged cereal with added sugar. Prepackaged snacks with added sugar. Meats and other proteins Beef. Pork. Lamb. Poultry with skin. Hot dogs. Tomasa Blase. Dairy Ice cream. Sour cream. Whole milk. Fats and oils Butter. Canola oil. Vegetable oil. Beef fat (tallow). Lard. Beverages Juice. Sugar-sweetened  soft drinks. Beer. Liquor and spirits. Sweets and desserts Cookies. Cakes. Pies. Candy. Seasonings and condiments Mayonnaise. Pre-made sauces and marinades. The items listed above may not be a complete list of foods and beverages you should limit. Contact a dietitian for more information. Summary The Mediterranean diet includes both food and lifestyle  choices. Eat a variety of fresh fruits and vegetables, beans, nuts, seeds, and whole grains. Limit the amount of red meat and sweets that you eat. If recommended by your health care provider, drink red wine in moderation. This means 1 glass a day for nonpregnant women and 2 glasses a day for men. A glass of wine equals 5 oz (150 mL). This information is not intended to replace advice given to you by your health care provider. Make sure you discuss any questions you have with your health care provider. Document Revised: 12/26/2019 Document Reviewed: 10/23/2019 Elsevier Patient Education  2023 Elsevier Inc. Adopting a Healthy Lifestyle.   Weight: Know what a healthy weight is for you (roughly BMI <25) and aim to maintain this. You can calculate your body mass index on your smart phone  Diet: Aim for 7+ servings of fruits and vegetables daily Limit animal fats in diet for cholesterol and heart health - choose grass fed whenever available Avoid highly processed foods (fast food burgers, tacos, fried chicken, pizza, hot dogs, french fries)  Saturated fat comes in the form of butter, lard, coconut oil, margarine, partially hydrogenated oils, and fat in meat. These increase your risk of cardiovascular disease.  Use healthy plant oils, such as olive, canola, soy, corn, sunflower and peanut.  Whole foods such as fruits, vegetables and whole grains have fiber  Men need > 38 grams of fiber per day Women need > 25 grams of fiber per day  Load up on vegetables and fruits - one-half of your plate: Aim for color and variety, and remember that potatoes dont count. Go for whole grains - one-quarter of your plate: Whole wheat, barley, wheat berries, quinoa, oats, brown rice, and foods made with them. If you want pasta, go with whole wheat pasta. Protein power - one-quarter of your plate: Fish, chicken, beans, and nuts are all healthy, versatile protein sources. Limit red meat. You need carbohydrates for  energy! The type of carbohydrate is more important than the amount. Choose carbohydrates such as vegetables, fruits, whole grains, beans, and nuts in the place of white rice, white pasta, potatoes (baked or fried), macaroni and cheese, cakes, cookies, and donuts.  If youre thirsty, drink water. Coffee and tea are good in moderation, but skip sugary drinks and limit milk and dairy products to one or two daily servings. Keep sugar intake at 6 teaspoons or 24 grams or LESS       Exercise: Aim for 150 min of moderate intensity exercise weekly for heart health, and weights twice weekly for bone health Stay active - any steps are better than no steps! Aim for 7-9 hours of sleep daily

## 2023-05-07 NOTE — Telephone Encounter (Signed)
Transition Care Management Unsuccessful Follow-up Telephone Call  Date of discharge and from where:  04/30/2023 [facility]  Attempts:  1st Attempt  Reason for unsuccessful TCM follow-up call:  Unable to reach patient  Kioni Stahl [facility]  [facility] Population Health Community Resource Care Guide   [email or web address]  ?? [phone]   Website: [email or web address]  [email or web address]

## 2023-05-08 ENCOUNTER — Telehealth: Payer: Self-pay

## 2023-05-08 NOTE — Telephone Encounter (Signed)
Transition Care Management Follow-up Telephone Call Date of discharge and from where: 04/30/2023 [facility] How have you been since you were released from the hospital? Patient stated he is feeling better Any questions or concerns? No  Items Reviewed: Did the pt receive and understand the discharge instructions provided? Yes  Medications obtained and verified? Yes  Other? No  Any new allergies since your discharge? No  Dietary orders reviewed? Yes Do you have support at home? Yes   Follow up appointments reviewed:  PCP Hospital f/u appt confirmed? No  Scheduled to see  on  @ . Specialist Hospital f/u appt confirmed? Yes  Scheduled to see Eligha Bridegroom, NP on 05/07/2023 @ [facility]. Are transportation arrangements needed? No  If their condition worsens, is the pt aware to call PCP or go to the Emergency Dept.? Yes Was the patient provided with contact information for the PCP's office or ED? Yes Was to pt encouraged to call back with questions or concerns? Yes  Margarite Vessel [facility]  [facility] Population Health Community Resource Care Guide   [email or web address]  ?? [phone]   Website: [email or web address]  [email or web address]

## 2023-05-09 ENCOUNTER — Encounter (HOSPITAL_BASED_OUTPATIENT_CLINIC_OR_DEPARTMENT_OTHER): Payer: Self-pay | Admitting: Emergency Medicine

## 2023-05-09 ENCOUNTER — Other Ambulatory Visit: Payer: Self-pay

## 2023-05-09 ENCOUNTER — Emergency Department (HOSPITAL_BASED_OUTPATIENT_CLINIC_OR_DEPARTMENT_OTHER)
Admission: EM | Admit: 2023-05-09 | Discharge: 2023-05-09 | Disposition: A | Discharge status: Home / Self Care | Payer: Medicare HMO | Attending: Emergency Medicine | Admitting: Emergency Medicine

## 2023-05-09 ENCOUNTER — Telehealth: Payer: Self-pay | Admitting: Family Medicine

## 2023-05-09 ENCOUNTER — Telehealth: Payer: Self-pay | Admitting: Nurse Practitioner

## 2023-05-09 ENCOUNTER — Emergency Department (HOSPITAL_BASED_OUTPATIENT_CLINIC_OR_DEPARTMENT_OTHER): Payer: Medicare HMO

## 2023-05-09 DIAGNOSIS — Z7984 Long term (current) use of oral hypoglycemic drugs: Secondary | ICD-10-CM | POA: Diagnosis not present

## 2023-05-09 DIAGNOSIS — N401 Enlarged prostate with lower urinary tract symptoms: Secondary | ICD-10-CM | POA: Diagnosis not present

## 2023-05-09 DIAGNOSIS — R001 Bradycardia, unspecified: Secondary | ICD-10-CM | POA: Diagnosis not present

## 2023-05-09 DIAGNOSIS — R600 Localized edema: Secondary | ICD-10-CM | POA: Diagnosis not present

## 2023-05-09 DIAGNOSIS — Z794 Long term (current) use of insulin: Secondary | ICD-10-CM | POA: Diagnosis not present

## 2023-05-09 DIAGNOSIS — R3912 Poor urinary stream: Secondary | ICD-10-CM | POA: Diagnosis present

## 2023-05-09 DIAGNOSIS — Z950 Presence of cardiac pacemaker: Secondary | ICD-10-CM | POA: Insufficient documentation

## 2023-05-09 DIAGNOSIS — Z951 Presence of aortocoronary bypass graft: Secondary | ICD-10-CM | POA: Insufficient documentation

## 2023-05-09 DIAGNOSIS — Z79899 Other long term (current) drug therapy: Secondary | ICD-10-CM | POA: Insufficient documentation

## 2023-05-09 DIAGNOSIS — E1165 Type 2 diabetes mellitus with hyperglycemia: Secondary | ICD-10-CM | POA: Insufficient documentation

## 2023-05-09 DIAGNOSIS — Z87891 Personal history of nicotine dependence: Secondary | ICD-10-CM | POA: Diagnosis not present

## 2023-05-09 DIAGNOSIS — N400 Enlarged prostate without lower urinary tract symptoms: Secondary | ICD-10-CM

## 2023-05-09 DIAGNOSIS — Z7982 Long term (current) use of aspirin: Secondary | ICD-10-CM | POA: Diagnosis not present

## 2023-05-09 DIAGNOSIS — I2510 Atherosclerotic heart disease of native coronary artery without angina pectoris: Secondary | ICD-10-CM | POA: Insufficient documentation

## 2023-05-09 DIAGNOSIS — I10 Essential (primary) hypertension: Secondary | ICD-10-CM | POA: Insufficient documentation

## 2023-05-09 DIAGNOSIS — R34 Anuria and oliguria: Secondary | ICD-10-CM

## 2023-05-09 DIAGNOSIS — N3289 Other specified disorders of bladder: Secondary | ICD-10-CM | POA: Diagnosis not present

## 2023-05-09 LAB — CBC
HCT: 43.4 % (ref 39.0–52.0)
Hemoglobin: 15.3 g/dL (ref 13.0–17.0)
MCH: 30.7 pg (ref 26.0–34.0)
MCHC: 35.3 g/dL (ref 30.0–36.0)
MCV: 87.1 fL (ref 80.0–100.0)
Platelets: 179 10*3/uL (ref 150–400)
RBC: 4.98 MIL/uL (ref 4.22–5.81)
RDW: 13.1 % (ref 11.5–15.5)
WBC: 5.4 10*3/uL (ref 4.0–10.5)
nRBC: 0 % (ref 0.0–0.2)

## 2023-05-09 LAB — URINALYSIS, ROUTINE W REFLEX MICROSCOPIC
Bacteria, UA: NONE SEEN
Bilirubin Urine: NEGATIVE
Glucose, UA: >1000 mg/dL — AB
Hgb urine dipstick: NEGATIVE
Ketones, ur: NEGATIVE mg/dL
Leukocytes,Ua: NEGATIVE
Nitrite: NEGATIVE
Protein, ur: NEGATIVE mg/dL
Specific Gravity, Urine: 1.034 — ABNORMAL HIGH (ref 1.005–1.030)
pH: 5.5 (ref 5.0–8.0)

## 2023-05-09 LAB — BASIC METABOLIC PANEL
Anion gap: 9 (ref 5–15)
BUN: 18 mg/dL (ref 8–23)
CO2: 23 mmol/L (ref 22–32)
Calcium: 9.9 mg/dL (ref 8.9–10.3)
Chloride: 102 mmol/L (ref 98–111)
Creatinine, Ser: 0.86 mg/dL (ref 0.61–1.24)
GFR, Estimated: >60 mL/min (ref >60)
Glucose, Bld: 259 mg/dL — ABNORMAL HIGH (ref 70–99)
Potassium: 4 mmol/L (ref 3.5–5.1)
Sodium: 134 mmol/L — ABNORMAL LOW (ref 135–145)

## 2023-05-09 NOTE — ED Notes (Signed)
Results for bladder scan shown to MD.

## 2023-05-09 NOTE — Telephone Encounter (Signed)
FYI

## 2023-05-09 NOTE — Telephone Encounter (Signed)
Agree with plan to go to ED.   Thank you.

## 2023-05-09 NOTE — Telephone Encounter (Signed)
Final Outcome: Go to ED Now.   Patient Name First: Paul Last: Paul Gender: Male DOB: Sep 05, 1956 Age: 67 Y 1 M 1 D Return Phone Number: [phone] (Primary) Address: City/ State/ Zip: [place]  [address] Client [facility], [facility] Provider Tana Conch- MD Contact Type Call Who Is Calling Patient / Member / Family / Caregiver Call Type Triage / Clinical Relationship To Patient Self Return Phone Number [phone] (Primary) Chief Complaint URINATE - sudden inability to urinate Reason for Call Symptomatic / Request for Health Information Initial Comment Caller states that they have not been able to urinate for 24 hours after starting lasik. Additional Comment Caller transferred to [place] by office. Translation No Nurse Assessment Nurse: Daphine Deutscher, RN, Melanie Date/Time (Eastern Time): 05/09/2023 2:38:05 PM Confirm and document reason for call. If symptomatic, describe symptoms. ---Caller states he has not been able to urinate since Tuesday morning. Has been in hospital and seen cardiology Monday. Started lasix Does the patient have any new or worsening symptoms? ---Yes Will a triage be completed? ---Yes Related visit to physician within the last 2 weeks? ---Yes Does the PT have any chronic conditions? (i.e. diabetes, asthma, this includes High risk factors for pregnancy, etc.) ---Yes List chronic conditions. ---diabetic Is this a behavioral health or substance abuse call? ---No Guidelines Guideline Title Affirmed Question Affirmed Notes Nurse Date/Time (Eastern Time) Urinary Symptoms [1] Unable to urinate (or only a few drops) > 4 hours AND [2] bladder feels very full (e.g., palpable bladder or strong urge to urinate) Daphine Deutscher, RN, Melanie 05/09/2023 2:40:24 PM PLEASE NOTE: All timestamps contained within this report are represented as [place] Standard Time. CONFIDENTIALTY  NOTICE: This fax transmission is intended only for the addressee. It contains information that is legally privileged, confidential or otherwise protected from use or disclosure. If you are not the intended recipient, you are strictly prohibited from reviewing, disclosing, copying using or disseminating any of this information or taking any action in reliance on or regarding this information. If you have received this fax in error, please notify us immediately by telephone so that we can arrange for its return to [place]. Phone: [phone], Toll-Free: [phone], Fax: [phone] Page: 2 of 2 Call Id: [id number] Disp. Time Lamount Cohen Time) Disposition Final User 05/09/2023 2:36:24 PM Send to Urgent Queue [place] 05/09/2023 2:43:39 PM Go to ED Now Yes Daphine Deutscher, RN, Melanie Final Disposition 05/09/2023 2:43:39 PM Go to ED Now Yes Daphine Deutscher, RN, Mittie Bodo Disagree/Comply Comply Caller Understands Yes PreDisposition Call Doctor Care Advice Given Per Guideline GO TO ED NOW: * You need to be seen in the Emergency Department. CARE ADVICE given per Urinary Symptoms (Adult) guideline. Referrals [facility] - ED

## 2023-05-09 NOTE — Telephone Encounter (Signed)
Pt c/o medication issue:  1. Name of Medication:   furosemide (LASIX) 20 MG tablet    2. How are you currently taking this medication (dosage and times per day)?  Take 1 tablet (20 mg total) by mouth daily as needed for fluid or edema.Patient not taking: Reported on 05/07/2023       3. Are you having a reaction (difficulty breathing--STAT)? No  4. What is your medication issue? Pt stated at his last office visit he was told to start above medication but now he's not going to the bathroom and he's concern. Pt stated he's been once since yesterday but he's taken 2 already. Pt would like a callback regarding this matter.

## 2023-05-09 NOTE — Discharge Instructions (Addendum)
We evaluated you for your low urine output.  Your laboratory tests did not show any sign of an injury to your kidneys and your urinalysis appeared normal.  We checked to see if you were retaining urine in your bladder and we did not see any signs of urinary retention.  Your kidneys also appeared normal on the CT scan.  We are not exactly sure why your urine output is decreased, but given that your testing is reassuring, I think it is safe for you to follow-up with your cardiologist as an outpatient.  Your CT scan did show an enlarged prostate.  This may cause some urine symptoms such as dribbling or urgency.  I would follow-up with a urologist if you have not followed up with one in the past.  You can follow-up with Dr. Laverle Patter.  Although your tests were reassuring, please return to the emergency department if you develop any new or worsening symptoms such as abdominal distention, painful urination, pain in your back, fevers or chills, difficulty breathing, worsening leg swelling, nausea vomiting, confusion, or any other new symptoms.

## 2023-05-09 NOTE — Telephone Encounter (Signed)
Attempted phone call to pt and left voicemail message to contact triage at [phone]. 

## 2023-05-09 NOTE — Telephone Encounter (Signed)
Spoke with pt who states he has taken Furosemide 20mg  - 1 tablet yesterday and today.  Pt reports dramatic decrease in urination since yesterday. He normally urinates x 4 throughout the night but was up x 2 and had no output.  He has urinated a very small amount this afternoon x 1.  He denies any improvement in his edema.   Pt advised due to lack of urinary output he should be seen in the ED immediately.  Pt states he was also advised to go to the ED by his PCP office as well. Pt verbalizes understanding and states he will have his son drive [facility] on [address].

## 2023-05-09 NOTE — Telephone Encounter (Signed)
May have obstruction and need cath- Emergency Department makes sense as we do not have cath equipment

## 2023-05-09 NOTE — ED Triage Notes (Signed)
Pt arrives to ED with c/o decreased urine output. Pt notes he started lasix yesterday and has not urinated but twice.

## 2023-05-09 NOTE — Telephone Encounter (Signed)
FYI: This call has been transferred to Access Nurse. Once the result note has been entered staff can address the message at that time.  Patient called in with the following symptoms:  Red Word: Hasn't urinated in 48 hours since starting lasix   Please advise at Mobile [phone] (mobile)  Message is routed to Provider Pool and [facility] Triage

## 2023-05-09 NOTE — Telephone Encounter (Signed)
Called and informed pt that PCP believes ED makes sense due to equipment. Pt verbalized understanding and stated he would make his way there. States he was able to urinate a little following triage.

## 2023-05-09 NOTE — Telephone Encounter (Signed)
Patient is returning call.  °

## 2023-05-10 NOTE — Telephone Encounter (Signed)
Patient seen at [facility] 05/09/23

## 2023-05-10 NOTE — ED Provider Notes (Signed)
[place] EMERGENCY DEPARTMENT AT [facility] Provider Note  CSN: [id number] Arrival date & time: 05/09/23 1731  Chief Complaint(s) Urine Output  HPI Paul Paul is a 67 y.o. male with prior history of cerebral aneurysm, diabetes, bradycardia status post pacemaker, hypertension presenting to the emergency department with decreased urine output.  Patient reports that he has had some leg swelling recently and his cardiologist prescribed him Lasix.  He reports he took 2 doses but has only had a couple episodes of urination.  He felt as if he would be urinating more.  Possibly has some lower abdominal discomfort but no dysuria.  No flank pain.  No fevers or chills.  No lightheadedness or dizziness.  No shortness of breath or chest pain.  Past Medical History Past Medical History:  Diagnosis Date   Anxiety    Arthritis    Bell palsy    > 30 years. Left side of face droops a little   Bradycardia    Cerebral aneurysm    Cerebral aneurysm without rupture 06/2011   Middle of Cerebral Artery   Complication of anesthesia 2012   after surgery for cliping of cerebral anerysum- In the room, patient was shaking, and patient would awaken and then "go back out" shaking continued after he "went back out:   Constipation    Diabetes mellitus    now under control   Dysrhythmia    BRADYCARDIA REQUIRING PACEMAKER .FOLLOWED BY GREGG TAYLOR   HYPERTENSION 12/10/2007   Pacemaker    Shoulder pain    L SHOULDER. RECENT FALL   Sleep apnea    CPAP.NOT WEARING NOW.NEEDS REPLACEMENT PIECE   Patient Active Problem List   Diagnosis Date Noted   Aortic atherosclerosis (HCC) 07/28/2020   CAD (coronary artery disease) status post drug-eluting stent mid LAD August 2021 07/28/2020   Sinus node dysfunction (HCC) 08/05/2019   Fatty liver 06/18/2018   Hepatotoxicity due to statin drug 10/19/2017   Elevated LFTs 08/09/2017   Chronic pruritic rash in adult 08/09/2017   GAD (generalized anxiety disorder)  10/11/2016   OSA on CPAP 09/24/2014   Hyperlipidemia associated with type 2 diabetes mellitus (HCC) 09/24/2014   Obesity 01/01/2014   Other specified cardiac dysrhythmias(427.89) 07/09/2012   Cerebral aneurysm without rupture 01/01/2012   Pacemaker 10/07/2011   Poorly controlled type 2 diabetes mellitus (HCC) 11/09/2008   Essential hypertension 12/10/2007   Home Medication(s) Prior to Admission medications   Medication Sig Start Date End Date Taking? Authorizing Provider  albuterol (VENTOLIN HFA) 108 (90 Base) MCG/ACT inhaler Inhale 2 puffs into the lungs every 6 (six) hours as needed for wheezing or shortness of breath (for bronchitis). Patient not taking: Reported on 04/30/2023 11/09/22   Shelva Majestic, MD  aspirin EC 81 MG tablet Take 81 mg by mouth daily.    [provider]  atorvastatin (LIPITOR) 80 MG tablet Take 1 tablet (80 mg total) by mouth daily. 09/15/22   Shelva Majestic, MD  furosemide (LASIX) 20 MG tablet Take 1 tablet (20 mg total) by mouth daily as needed for fluid or edema. Patient not taking: Reported on 05/07/2023 04/30/23   Pricilla Loveless, MD  glimepiride (AMARYL) 4 MG tablet TAKE 2 TABLETS EVERY DAY BEFORE BREAKFAST 02/28/23   Shelva Majestic, MD  insulin aspart protamine - aspart (NOVOLOG 70/30 FLEXPEN) (70-30) 100 UNIT/ML FlexPen Inject 20-30 Units into the skin 2 (two) times daily. 02/22/23   Shelva Majestic, MD  Insulin Pen Needle 31G X 8 MM  MISC 1 each by Does not apply route 2 (two) times daily before a meal. 09/15/22   Shelva Majestic, MD  metoprolol tartrate (LOPRESSOR) 25 MG tablet TAKE 1 TABLET EVERY DAY 09/15/22   Marinus Maw, MD  nitroGLYCERIN (NITROSTAT) 0.4 MG SL tablet Place 1 tablet (0.4 mg total) under the tongue every 5 (five) minutes as needed for chest pain (up to 3 doses. call for help if doesnt resolve after 1 dose.). 07/28/20   Shelva Majestic, MD                                                                                                                                     Past Surgical History Past Surgical History:  Procedure Laterality Date   CARDIAC CATHETERIZATION  07/06/11   normal   CEREBRAL ANEURYSM REPAIR  06/2011   "stenting and coiling" per patient   CORONARY STENT INTERVENTION N/A 07/21/2020   Procedure: CORONARY STENT INTERVENTION;  Surgeon: Iran Ouch, MD;  Location: [facility] INVASIVE CV LAB;  Service: Cardiovascular;  Laterality: N/A;   FRACTURE SURGERY Left    foot   INSERT / REPLACE / REMOVE PACEMAKER  2012   KNEE SURGERY Left    left; arthoscopic   LEFT HEART CATH AND CORONARY ANGIOGRAPHY N/A 07/21/2020   Procedure: LEFT HEART CATH AND CORONARY ANGIOGRAPHY;  Surgeon: Iran Ouch, MD;  Location: [facility] INVASIVE CV LAB;  Service: Cardiovascular;  Laterality: N/A;   PPM GENERATOR CHANGEOUT N/A 02/23/2022   Procedure: PPM GENERATOR CHANGEOUT;  Surgeon: Marinus Maw, MD;  Location: [facility] INVASIVE CV LAB;  Service: Cardiovascular;  Laterality: N/A;   rotator cuff surgery Left 02/2012   septal deviation     SHOULDER ARTHROSCOPY WITH SUBACROMIAL DECOMPRESSION, ROTATOR CUFF REPAIR AND BICEP TENDON REPAIR Right 08/24/2015   Procedure: RIGHT SHOULDER DIAGNOSTIC OPERATIVE ARTHROSCOPY WITH DEBRIDEMENT, SUBACROMIAL DECOMPRESSION, MIMI-OPEN ROTATOR CUFF TEAR REPAIR AND POSSIBLE  BICEPS TENODESIS.  ;  Surgeon: Cammy Copa, MD;  Location: [facility] OR;  Service: Orthopedics;  Laterality: Right;  RIGHT SHOULDER DIAGNOSTIC OPERATIVE ARTHROSCOPY, DEBRIDEMENT, SUBACROMIAL DECOMPRESSION, MINI-OPEN ROTATOR CUFF TEAR REPAIR, POSSIB   Family History Family History  Problem Relation Age of Onset   Heart attack Mother        died 84, smoker   Heart disease Mother    Diabetes Mother    Heart attack Father        died 45, smoker   Heart disease Father     Social History Social History   Tobacco Use   Smoking status: Former    Packs/day: 1.00    Years: 20.00    Additional pack years: 0.00    Total pack years:  20.00    Types: Cigarettes    Quit date: 07/05/2011    Years since quitting: 11.8   Smokeless tobacco: Never  Vaping Use   Vaping Use: Never used  Substance Use Topics  Alcohol use: No    Alcohol/week: 0.0 standard drinks of alcohol   Drug use: No   Allergies Doxycycline, Lisinopril, and Losartan potassium-hctz  Review of Systems Review of Systems  All other systems reviewed and are negative.   Physical Exam Vital Signs  I have reviewed the triage vital signs BP 138/78   Pulse 60   Temp 97.9 F (36.6 C) (Oral)   Resp 17   SpO2 96%  Physical Exam Vitals and nursing note reviewed.  Constitutional:      General: He is not in acute distress.    Appearance: Normal appearance.  HENT:     Mouth/Throat:     Mouth: Mucous membranes are moist.  Eyes:     Conjunctiva/sclera: Conjunctivae normal.  Cardiovascular:     Rate and Rhythm: Normal rate and regular rhythm.  Pulmonary:     Effort: Pulmonary effort is normal. No respiratory distress.     Breath sounds: Normal breath sounds.  Abdominal:     General: Abdomen is flat.     Palpations: Abdomen is soft.     Tenderness: There is no abdominal tenderness. There is no right CVA tenderness or left CVA tenderness.  Musculoskeletal:     Right lower leg: Edema present.     Left lower leg: Edema present.     Comments: 1+ b/l edema   Skin:    General: Skin is warm and dry.     Capillary Refill: Capillary refill takes less than 2 seconds.  Neurological:     Mental Status: He is alert and oriented to person, place, and time. Mental status is at baseline.  Psychiatric:        Mood and Affect: Mood normal.        Behavior: Behavior normal.     ED Results and Treatments Labs (all labs ordered are listed, but only abnormal results are displayed) Labs Reviewed  BASIC METABOLIC PANEL - Abnormal; Notable for the following components:      Result Value   Sodium 134 (*)    Glucose, Bld 259 (*)    All other components within  normal limits  URINALYSIS, ROUTINE W REFLEX MICROSCOPIC - Abnormal; Notable for the following components:   Specific Gravity, Urine 1.034 (*)    Glucose, UA >1,000 (*)    All other components within normal limits  CBC                                                                                                                          Radiology CT Renal Stone Study  Result Date: 05/09/2023 CLINICAL DATA:  Bladder neck obstruction. Decreased urine output. Started Lasix yesterday. EXAM: CT ABDOMEN AND PELVIS WITHOUT CONTRAST TECHNIQUE: Multidetector CT imaging of the abdomen and pelvis was performed following the standard protocol without IV contrast. RADIATION DOSE REDUCTION: This exam was performed according to the departmental dose-optimization program which includes automated exposure control, adjustment of the mA and/or kV according to patient size and/or  use of iterative reconstruction technique. COMPARISON:  02/08/2006 FINDINGS: Lower chest: Lung bases are clear. Hepatobiliary: Nodular contour to the liver suggests hepatic cirrhosis. No focal lesions are demonstrated on noncontrast imaging. Gallbladder and bile ducts are normal. Pancreas: Unremarkable. No pancreatic ductal dilatation or surrounding inflammatory changes. Spleen: Normal in size without focal abnormality. Adrenals/Urinary Tract: No adrenal gland nodules. Kidneys are symmetrical. No renal or ureteral stones. No hydronephrosis or hydroureter. The bladder appears somewhat under distended. Mild bladder wall thickening may be due to under distention, cystitis, or chronic outlet obstruction. No stones or focal filling defects. Stomach/Bowel: Stomach, small bowel, and colon are not abnormally distended. No wall thickening or inflammatory changes. Stool diffusely throughout the colon. Appendix is not identified. Vascular/Lymphatic: Aortic atherosclerosis. No enlarged abdominal or pelvic lymph nodes. Reproductive: Prostate gland is enlarged,  measuring 4.8 cm diameter. Other: No abdominal wall hernia or abnormality. No abdominopelvic ascites. Musculoskeletal: Degenerative changes in the spine. IMPRESSION: 1. Bladder is under distended. Mild bladder wall thickening may be due to under distention, cystitis, or chronic outlet obstruction. 2. Prostate gland is enlarged, measuring 4.8 cm diameter. 3. Nodular contour of the liver suggesting hepatic cirrhosis. 4. Aortic atherosclerosis. Electronically Signed   By: Burman Nieves M.D.   On: 05/09/2023 21:03    Pertinent labs & imaging results that were available during my care of the patient were reviewed by me and considered in my medical decision making (see MDM for details).  Medications Ordered in ED Medications - No data to display                                                                                                                                   Procedures Procedures  (including critical care time)  Medical Decision Making / ED Course   MDM:  67 year old male presenting to the emergency department with decreased urine output.  Patient overall well-appearing, physical exam with no abdominal tenderness, no CVA tenderness.  Has chronic 1+ BLE edema which seems unchanged per patient.  Unclear cause of decreased urine output, suspect most likely cause is that patient could be mildly dehydrated.  His urine specific gravity is slightly elevated, but his laboratory testing Is reassuring including his creatinine. There is no evidence of infection on his urinalysis.  Considered urinary retention but bladder scan with only around 80 cc after void.  Laboratory testing reassuring with normal creatinine.  No evidence of kidney stone or upper urinary tract obstruction on CT renal stone protocol.  His prostate is enlarged on the CT scan but patient not having any pain, fevers or chills to suggest occult prostatitis or infection, advised follow-up with urology for this and provided  contact information.  Patient has mild chronic edema to both lower extremities, doubt CHF, BNP was checked around 1 week ago and was normal and his lungs are clear on exam.  Advised also follow-up with his cardiologist who prescribed him the Lasix.  Patient reports that they told him to stop taking it if it did not seem to change his symptoms so he will stop for now. Will discharge patient to home. All questions answered. Patient comfortable with plan of discharge. Return precautions discussed with patient and specified on the after visit summary.       Additional history obtained: -Additional history obtained from family -External records from outside source obtained and reviewed including: Chart review including previous notes, labs, imaging, consultation notes including cardiology notes   Lab Tests: -I ordered, reviewed, and interpreted labs.   The pertinent results include:   Labs Reviewed  BASIC METABOLIC PANEL - Abnormal; Notable for the following components:      Result Value   Sodium 134 (*)    Glucose, Bld 259 (*)    All other components within normal limits  URINALYSIS, ROUTINE W REFLEX MICROSCOPIC - Abnormal; Notable for the following components:   Specific Gravity, Urine 1.034 (*)    Glucose, UA >1,000 (*)    All other components within normal limits  CBC    Notable for mild hyperglycemia, concentrated urine  Imaging Studies ordered: I ordered imaging studies including CT renal stone On my interpretation imaging demonstrates enlarged prostate, no upper obstruction I independently visualized and interpreted imaging. I agree with the radiologist interpretation   Medicines ordered and prescription drug management: No orders of the defined types were placed in this encounter.   -I have reviewed the patients home medicines and have made adjustments as needed   Social Determinants of Health:  Diagnosis or treatment significantly limited by social determinants of health:  former smoker   Co morbidities that complicate the patient evaluation  Past Medical History:  Diagnosis Date   Anxiety    Arthritis    Bell palsy    > 30 years. Left side of face droops a little   Bradycardia    Cerebral aneurysm    Cerebral aneurysm without rupture 06/2011   Middle of Cerebral Artery   Complication of anesthesia 2012   after surgery for cliping of cerebral anerysum- In the room, patient was shaking, and patient would awaken and then "go back out" shaking continued after he "went back out:   Constipation    Diabetes mellitus    now under control   Dysrhythmia    BRADYCARDIA REQUIRING PACEMAKER .FOLLOWED BY GREGG TAYLOR   HYPERTENSION 12/10/2007   Pacemaker    Shoulder pain    L SHOULDER. RECENT FALL   Sleep apnea    CPAP.NOT WEARING NOW.NEEDS REPLACEMENT PIECE      Dispostion: Disposition decision including need for hospitalization was considered, and patient discharged from emergency department.    Final Clinical Impression(s) / ED Diagnoses Final diagnoses:  Urine output low  Enlarged prostate     This chart was dictated using voice recognition software.  Despite best efforts to proofread,  errors can occur which can change the documentation meaning.    Lonell Grandchild, MD 05/10/23 1257

## 2023-05-11 DIAGNOSIS — R3914 Feeling of incomplete bladder emptying: Secondary | ICD-10-CM | POA: Diagnosis not present

## 2023-05-11 DIAGNOSIS — R3912 Poor urinary stream: Secondary | ICD-10-CM | POA: Diagnosis not present

## 2023-05-14 ENCOUNTER — Telehealth: Payer: Self-pay

## 2023-05-14 NOTE — Telephone Encounter (Signed)
Transition Care Management Follow-up Telephone Call Date of discharge and from where: [facility] 6/4 How have you been since you were released from the hospital? Doing fine  Any questions or concerns? No  Items Reviewed: Did the pt receive and understand the discharge instructions provided? Yes  Medications obtained and verified? Yes  Other? No  Any new allergies since your discharge? No  Dietary orders reviewed? No Do you have support at home? Yes    Follow up appointments reviewed:  PCP Hospital f/u appt confirmed? No  Scheduled to see  on  @ . Specialist Hospital f/u appt confirmed? Yes  Scheduled to see  on  @ . Are transportation arrangements needed? No  If their condition worsens, is the pt aware to call PCP or go to the Emergency Dept.? Yes Was the patient provided with contact information for the PCP's office or ED? Yes Was to pt encouraged to call back with questions or concerns? Yes

## 2023-05-29 ENCOUNTER — Encounter: Payer: Self-pay | Admitting: Physician Assistant

## 2023-05-29 ENCOUNTER — Ambulatory Visit (INDEPENDENT_AMBULATORY_CARE_PROVIDER_SITE_OTHER): Payer: Medicare HMO | Admitting: Physician Assistant

## 2023-05-29 VITALS — BP 126/70 | HR 64 | Temp 97.5°F | Ht 75.0 in | Wt 263.4 lb

## 2023-05-29 DIAGNOSIS — R058 Other specified cough: Secondary | ICD-10-CM | POA: Diagnosis not present

## 2023-05-29 LAB — POC COVID19 BINAXNOW: SARS Coronavirus 2 Ag: NEGATIVE

## 2023-05-29 MED ORDER — AZITHROMYCIN 250 MG PO TABS
ORAL_TABLET | ORAL | 0 refills | Status: DC
Start: 2023-05-29 — End: 2023-10-15

## 2023-05-29 MED ORDER — PREDNISONE 20 MG PO TABS
20.0000 mg | ORAL_TABLET | Freq: Two times a day (BID) | ORAL | 0 refills | Status: AC
Start: 2023-05-29 — End: 2023-06-03

## 2023-05-29 NOTE — Progress Notes (Signed)
Subjective:    Patient ID: Paul Paul, male    DOB: [birth year], 67 y.o.   MRN: [id number]  Chief Complaint  Patient presents with   Cough    Pt c/o cough and congestion for 7days, has not tested for COVID. Started in head now it has moved to his chest.    Cough   Patient is in today for acute cough / congestion symptoms x 7 days. Not SOB. No CP. Had chills at first, but no fever. Taking Nyquil and Mucinex OTC. Not improving. Usually gets these symptoms this time of year annually. No asthma or COPD. Wife has similar symptoms currently.   Past Medical History:  Diagnosis Date   Anxiety    Arthritis    Bell palsy    > 30 years. Left side of face droops a little   Bradycardia    Cerebral aneurysm    Cerebral aneurysm without rupture 06/2011   Middle of Cerebral Artery   Complication of anesthesia 2012   after surgery for cliping of cerebral anerysum- In the room, patient was shaking, and patient would awaken and then "go back out" shaking continued after he "went back out:   Constipation    Diabetes mellitus    now under control   Dysrhythmia    BRADYCARDIA REQUIRING PACEMAKER .FOLLOWED BY GREGG TAYLOR   HYPERTENSION 12/10/2007   Pacemaker    Shoulder pain    L SHOULDER. RECENT FALL   Sleep apnea    CPAP.NOT WEARING NOW.NEEDS REPLACEMENT PIECE    Past Surgical History:  Procedure Laterality Date   CARDIAC CATHETERIZATION  07/06/11   normal   CEREBRAL ANEURYSM REPAIR  06/2011   "stenting and coiling" per patient   CORONARY STENT INTERVENTION N/A 07/21/2020   Procedure: CORONARY STENT INTERVENTION;  Surgeon: Iran Ouch, MD;  Location: [facility] INVASIVE CV LAB;  Service: Cardiovascular;  Laterality: N/A;   FRACTURE SURGERY Left    foot   INSERT / REPLACE / REMOVE PACEMAKER  2012   KNEE SURGERY Left    left; arthoscopic   LEFT HEART CATH AND CORONARY ANGIOGRAPHY N/A 07/21/2020   Procedure: LEFT HEART CATH AND CORONARY ANGIOGRAPHY;  Surgeon: Iran Ouch, MD;  Location:  [facility] INVASIVE CV LAB;  Service: Cardiovascular;  Laterality: N/A;   PPM GENERATOR CHANGEOUT N/A 02/23/2022   Procedure: PPM GENERATOR CHANGEOUT;  Surgeon: Marinus Maw, MD;  Location: [facility] INVASIVE CV LAB;  Service: Cardiovascular;  Laterality: N/A;   rotator cuff surgery Left 02/2012   septal deviation     SHOULDER ARTHROSCOPY WITH SUBACROMIAL DECOMPRESSION, ROTATOR CUFF REPAIR AND BICEP TENDON REPAIR Right 08/24/2015   Procedure: RIGHT SHOULDER DIAGNOSTIC OPERATIVE ARTHROSCOPY WITH DEBRIDEMENT, SUBACROMIAL DECOMPRESSION, MIMI-OPEN ROTATOR CUFF TEAR REPAIR AND POSSIBLE  BICEPS TENODESIS.  ;  Surgeon: Cammy Copa, MD;  Location: [facility] OR;  Service: Orthopedics;  Laterality: Right;  RIGHT SHOULDER DIAGNOSTIC OPERATIVE ARTHROSCOPY, DEBRIDEMENT, SUBACROMIAL DECOMPRESSION, MINI-OPEN ROTATOR CUFF TEAR REPAIR, POSSIB    Family History  Problem Relation Age of Onset   Heart attack Mother        died 9, smoker   Heart disease Mother    Diabetes Mother    Heart attack Father        died 79, smoker   Heart disease Father     Social History   Tobacco Use   Smoking status: Former    Packs/day: 1.00    Years: 20.00    Additional pack years: 0.00  Total pack years: 20.00    Types: Cigarettes    Quit date: 07/05/2011    Years since quitting: 11.9   Smokeless tobacco: Never  Vaping Use   Vaping Use: Never used  Substance Use Topics   Alcohol use: No    Alcohol/week: 0.0 standard drinks of alcohol   Drug use: No     Allergies  Allergen Reactions   Doxycycline Rash and Nausea Only   Lisinopril Rash   Losartan Potassium-Hctz Other (See Comments)    Caused dizziness & fatigue    Review of Systems  Respiratory:  Positive for cough.    NEGATIVE UNLESS OTHERWISE INDICATED IN HPI      Objective:     BP 126/70   Pulse 64   Temp (!) 97.5 F (36.4 C)   Ht 6\' 3"  (1.905 m)   Wt 263 lb 6.4 oz (119.5 kg)   SpO2 95%   BMI 32.92 kg/m   Wt Readings from Last 3 Encounters:   05/29/23 263 lb 6.4 oz (119.5 kg)  05/07/23 266 lb 9.6 oz (120.9 kg)  04/30/23 265 lb (120.2 kg)    BP Readings from Last 3 Encounters:  05/29/23 126/70  05/09/23 138/78  05/07/23 112/62     Physical Exam Vitals and nursing note reviewed.  Constitutional:      General: He is not in acute distress.    Appearance: Normal appearance. He is not ill-appearing.  HENT:     Head: Normocephalic.     Right Ear: External ear normal.     Left Ear: External ear normal.     Nose: No congestion.     Mouth/Throat:     Mouth: Mucous membranes are moist.     Pharynx: No oropharyngeal exudate or posterior oropharyngeal erythema.  Eyes:     Extraocular Movements: Extraocular movements intact.     Conjunctiva/sclera: Conjunctivae normal.     Pupils: Pupils are equal, round, and reactive to light.  Cardiovascular:     Rate and Rhythm: Normal rate and regular rhythm.     Pulses: Normal pulses.     Heart sounds: Normal heart sounds. No murmur heard. Pulmonary:     Effort: Pulmonary effort is normal. No respiratory distress.     Breath sounds: Normal breath sounds. No wheezing.     Comments: Some hacking cough Musculoskeletal:     Cervical back: Normal range of motion.  Skin:    General: Skin is warm.  Neurological:     Mental Status: He is alert and oriented to person, place, and time.  Psychiatric:        Mood and Affect: Mood normal.        Behavior: Behavior normal.        Assessment & Plan:  Productive cough -     POC COVID-19 BinaxNow -     Azithromycin; Take two tablets on day one, followed by one tablet daily for the next four days.  Dispense: 6 tablet; Refill: 0 -     predniSONE; Take 1 tablet (20 mg total) by mouth 2 (two) times daily with a meal for 5 days.  Dispense: 10 tablet; Refill: 0    Reassured pt that POC COVID-19 is negative. No red flags on exam. Lungs really sound good, fairly CTAB. Pt is persistent that he has this every year and Z-pak and prednisone help.  Given duration of symptoms, will go ahead with Rx at this time. Pt aware of risks vs benefits and possible adverse reactions.  Needs to monitor glucose levels. Ok to use Mucinex. Pt agreeable with plan.     Return if symptoms worsen or fail to improve.     Sherlynn Tourville M Willie Loy, PA-C

## 2023-06-05 ENCOUNTER — Telehealth: Payer: Self-pay | Admitting: Internal Medicine

## 2023-06-05 ENCOUNTER — Ambulatory Visit (HOSPITAL_COMMUNITY): Payer: Medicare HMO | Attending: Internal Medicine

## 2023-06-05 DIAGNOSIS — I10 Essential (primary) hypertension: Secondary | ICD-10-CM | POA: Insufficient documentation

## 2023-06-05 DIAGNOSIS — R600 Localized edema: Secondary | ICD-10-CM | POA: Insufficient documentation

## 2023-06-05 DIAGNOSIS — Z950 Presence of cardiac pacemaker: Secondary | ICD-10-CM | POA: Diagnosis not present

## 2023-06-05 DIAGNOSIS — I2510 Atherosclerotic heart disease of native coronary artery without angina pectoris: Secondary | ICD-10-CM | POA: Diagnosis not present

## 2023-06-05 LAB — ECHOCARDIOGRAM COMPLETE
Area-P 1/2: 2 cm2
S' Lateral: 3.4 cm

## 2023-06-05 NOTE — Telephone Encounter (Signed)
Patient is requesting a call back to discuss echo results. 

## 2023-06-05 NOTE — Telephone Encounter (Signed)
Pt reports he is calling because the result has already been  sent to his mychart but he doesn't know what it means. Aware MD has not read the echo yet, but that we would be in touch w/ result once reviewed by MD. Pt appreciates the information and agreeable to plan.

## 2023-07-02 NOTE — Progress Notes (Deleted)
Cardiology Office Note:    Date:  07/02/2023   ID:  Paul Paul, Paul Paul Jul 12, 1956, MRN [id number]  PCP:  Paul Majestic, MD   [facility] Providers Cardiologist:  Paul Constant, MD     Referring MD: Paul Majestic, MD   Chief Complaint: leg swelling ***  History of Present Illness:    Paul Paul is a pleasant 67 y.o. male with a hx of CAD s/p anterior MI symptomatic bradycardia s/p PPM implant, HTN, morbid obesity, cerebral aneurysm s/p coiling and hyperlipidemia.   Pacemaker implant 2012 for syncope secondary to bradycardia.  NSTEMI 07/21/2020 with subsequent mLAD PCI with residual disease D1 60%, dLAD 40%.   Seen by Dr. Raynelle Paul 07/20/2021 at which time Lasix was stopped and aspirin 81 mg alone was continued.  Discussion of stopping metoprolol ensued with recommendation from Dr. Izora Paul to resume if recurrent angina.  He underwent PPM generator change out 02/23/22 with Dr. Ladona Paul.  Last seen in clinic by Dr. Ladona Paul on 06/02/2022 at which time he was doing well.   He presented to ED on 04/30/2023 with exertional dyspnea progressively worsening over the previous 2 to 4 weeks. Seems worse the morning of arrival after walking up stairs.  He had no appetite for breakfast which is atypical for him.  Some chest pain and diaphoresis.  Noticed arm tingling in the forearm and his son thought his abdomen had gotten bigger. Hs trops normal at 13 >> 11. BNP was normal at 49. No evidence of acute concerns. Cardiology curbside consult by Dr. Jacques Paul who recommended Lasix 20 mg daily with soon follow-up.  Seen by me on 05/07/23 for ED follow-up, accompanied by his wife. Reported no further episodes of shortness of breath with walking. Walked in today from parking lot with no symptoms. Was concerned on day of ED visit additionally about left arm pain which has resolved. Symptom prior to NSTEMI 07/2020 was chest pain. Continues to workout. He denied chest pain, shortness of  breath, DOE, palpitations, orthopnea, PND. Has not taken Lasix due to concern about frequent urination. His wife and his son think his abdomen looks distended. He has a large umbilical hernia. Admits to eating bacon and sausage frequently. His wife says he drinks a large amount of soda and will not eat a meal without meat.  He had bilateral LE edema extending up to his calves.  He reported stable weight.  Encouraged him to follow low-sodium diet and take Lasix 20 mg for 2 to 3 days for leg edema. He was also due for follow-up with EP for PPM.   Echocardiogram completed 06/05/2023 revealed the EF 60 to 65%, no RWMA, G1 DD, normal RV, no significant valve disease.   Today,   Past Medical History:  Diagnosis Date   Anxiety    Arthritis    Bell palsy    > 30 years. Left side of face droops a little   Bradycardia    Cerebral aneurysm    Cerebral aneurysm without rupture 06/2011   Middle of Cerebral Artery   Complication of anesthesia 2012   after surgery for cliping of cerebral anerysum- In the room, patient was shaking, and patient would awaken and then "go back out" shaking continued after he "went back out:   Constipation    Diabetes mellitus    now under control   Dysrhythmia    BRADYCARDIA REQUIRING PACEMAKER .FOLLOWED BY GREGG TAYLOR   HYPERTENSION 12/10/2007   Pacemaker    Shoulder  pain    L SHOULDER. RECENT FALL   Sleep apnea    CPAP.NOT WEARING NOW.NEEDS REPLACEMENT PIECE    Past Surgical History:  Procedure Laterality Date   CARDIAC CATHETERIZATION  07/06/11   normal   CEREBRAL ANEURYSM REPAIR  06/2011   "stenting and coiling" per patient   CORONARY STENT INTERVENTION N/A 07/21/2020   Procedure: CORONARY STENT INTERVENTION;  Surgeon: Iran Ouch, MD;  Location: [facility] INVASIVE CV LAB;  Service: Cardiovascular;  Laterality: N/A;   FRACTURE SURGERY Left    foot   INSERT / REPLACE / REMOVE PACEMAKER  2012   KNEE SURGERY Left    left; arthoscopic   LEFT HEART CATH AND CORONARY  ANGIOGRAPHY N/A 07/21/2020   Procedure: LEFT HEART CATH AND CORONARY ANGIOGRAPHY;  Surgeon: Iran Ouch, MD;  Location: [facility] INVASIVE CV LAB;  Service: Cardiovascular;  Laterality: N/A;   PPM GENERATOR CHANGEOUT N/A 02/23/2022   Procedure: PPM GENERATOR CHANGEOUT;  Surgeon: Marinus Maw, MD;  Location: [facility] INVASIVE CV LAB;  Service: Cardiovascular;  Laterality: N/A;   rotator cuff surgery Left 02/2012   septal deviation     SHOULDER ARTHROSCOPY WITH SUBACROMIAL DECOMPRESSION, ROTATOR CUFF REPAIR AND BICEP TENDON REPAIR Right 08/24/2015   Procedure: RIGHT SHOULDER DIAGNOSTIC OPERATIVE ARTHROSCOPY WITH DEBRIDEMENT, SUBACROMIAL DECOMPRESSION, MIMI-OPEN ROTATOR CUFF TEAR REPAIR AND POSSIBLE  BICEPS TENODESIS.  ;  Surgeon: Cammy Copa, MD;  Location: [facility] OR;  Service: Orthopedics;  Laterality: Right;  RIGHT SHOULDER DIAGNOSTIC OPERATIVE ARTHROSCOPY, DEBRIDEMENT, SUBACROMIAL DECOMPRESSION, MINI-OPEN ROTATOR CUFF TEAR REPAIR, POSSIB    Current Medications: No outpatient medications have been marked as taking for the 07/10/23 encounter (Appointment) with Lissa Hoard Zachary George, NP.     Allergies:   Doxycycline, Lisinopril, and Losartan potassium-hctz   Social History   Socioeconomic History   Marital status: Married    Spouse name: Not on file   Number of children: Not on file   Years of education: Not on file   Highest education level: Not on file  Occupational History   Occupation: Disabled  Tobacco Use   Smoking status: Former    Current packs/day: 0.00    Average packs/day: 1 pack/day for 20.0 years (20.0 ttl pk-yrs)    Types: Cigarettes    Start date: 07/05/1991    Quit date: 07/05/2011    Years since quitting: 12.0   Smokeless tobacco: Never  Vaping Use   Vaping status: Never Used  Substance and Sexual Activity   Alcohol use: No    Alcohol/week: 0.0 standard drinks of alcohol   Drug use: No   Sexual activity: Not on file  Other Topics Concern   Not on file  Social History  Narrative   Married over 30 years in 2015. 1 step son. 1 grandson.       Disabled from brain aneurysm, pacemaker. Retired from [employer] years.       Hobbies: grandson, exercise at [facility]   Social Determinants of Health   Financial Resource Strain: Low Risk  (11/06/2022)   Overall Financial Resource Strain (CARDIA)    Difficulty of Paying Living Expenses: Not hard at all  Food Insecurity: No Food Insecurity (11/06/2022)   Hunger Vital Sign    Worried About Running Out of Food in the Last Year: Never true    Ran Out of Food in the Last Year: Never true  Transportation Needs: No Transportation Needs (11/06/2022)   PRAPARE - Administrator, Civil Service (Medical): No  Lack of Transportation (Non-Medical): No  Physical Activity: Sufficiently Active (11/06/2022)   Exercise Vital Sign    Days of Exercise per Week: 5 days    Minutes of Exercise per Session: 60 min  Stress: No Stress Concern Present (11/06/2022)   [employer] of Occupational Health - Occupational Stress Questionnaire    Feeling of Stress : Only a little  Recent Concern: Stress - Stress Concern Present (10/31/2022)   [employer] of Occupational Health - Occupational Stress Questionnaire    Feeling of Stress : To some extent  Social Connections: Moderately Integrated (11/06/2022)   Social Connection and Isolation Panel [NHANES]    Frequency of Communication with Friends and Family: More than three times a week    Frequency of Social Gatherings with Friends and Family: More than three times a week    Attends Religious Services: More than 4 times per year    Active Member of [employer] or Organizations: No    Attends Banker Meetings: Never    Marital Status: Married     Family History: The patient's family history includes Diabetes in his mother; Heart attack in his father and mother; Heart disease in his father and mother.  ROS:   Please see the history of present illness.    +  bilateral LE edema All other systems reviewed and are negative.  Labs/Other Studies Reviewed:    The following studies were reviewed today:  Echo Complete 06/05/23  1. Left ventricular ejection fraction, by estimation, is 60 to 65%. The  left ventricle has normal function. The left ventricle has no regional  wall motion abnormalities. Left ventricular diastolic parameters are  consistent with Grade I diastolic  dysfunction (impaired relaxation).   2. Right ventricular systolic function is normal. The right ventricular  size is normal.   3. The mitral valve is normal in structure. No evidence of mitral valve  regurgitation. No evidence of mitral stenosis.   4. The aortic valve is tricuspid. Aortic valve regurgitation is not  visualized.   5. The inferior vena cava is normal in size with greater than 50%  respiratory variability, suggesting right atrial pressure of 3 mmHg.   Comparison(s): Prior images reviewed side by side. IVC is less dilated  from prior.  LHC 07/21/20 1st Diag lesion is 60% stenosed. Mid LAD lesion is 95% stenosed. Post intervention, there is a 5% residual stenosis. A drug-eluting stent was successfully placed using a STENT RESOLUTE ONYX 2.5X26. Dist LAD lesion is 40% stenosed.   1.  Severe one-vessel coronary artery disease with 95% moderately calcified stenosis in the mid LAD which seems to be the culprit for non-ST elevation myocardial infarction.  The RCA is heavily calcified with mild diffuse disease.   2.  Mildly elevated left ventricular end-diastolic pressure at 15 mmHg. 3.  Successful angioplasty and drug-eluting stent placement to the mid LAD.   Recommendations: Dual antiplatelet therapy for at least 1 year. Aggressive treatment of risk factors. Likely discharge home tomorrow if no issues.  Echo 07/21/20 1. Left ventricular ejection fraction, by estimation, is 55 to 60%. The  left ventricle has normal function. The left ventricle has no regional   wall motion abnormalities. Left ventricular diastolic parameters were  normal.   2. Pacing wires in RA/RV. Right ventricular systolic function is normal.  The right ventricular size is normal.   3. The mitral valve is normal in structure. No evidence of mitral valve  regurgitation. No evidence of mitral stenosis.   4.  The aortic valve was not well visualized. Aortic valve regurgitation  is not visualized. No aortic stenosis is present.   5. The inferior vena cava is normal in size with greater than 50%  respiratory variability, suggesting right atrial pressure of 3 mmHg.     Recent Labs: 04/30/2023: ALT 53; B Natriuretic Peptide 49.2 05/09/2023: BUN 18; Creatinine, Ser 0.86; Hemoglobin 15.3; Platelets 179; Potassium 4.0; Sodium 134  Recent Lipid Panel    Component Value Date/Time   CHOL 139 02/22/2023 1106   TRIG 154.0 (H) 02/22/2023 1106   HDL 49.10 02/22/2023 1106   CHOLHDL 3 02/22/2023 1106   VLDL 30.8 02/22/2023 1106   LDLCALC 60 02/22/2023 1106   LDLCALC 39 09/28/2020 0927   LDLDIRECT 104.0 08/09/2017 1402     Risk Assessment/Calculations:           Physical Exam:    VS:  There were no vitals taken for this visit.    Wt Readings from Last 3 Encounters:  05/29/23 263 lb 6.4 oz (119.5 kg)  05/07/23 266 lb 9.6 oz (120.9 kg)  04/30/23 265 lb (120.2 kg)     GEN: Obese well developed in no acute distress HEENT: Normal NECK: No JVD; No carotid bruits CARDIAC: RRR, no murmurs, rubs, gallops RESPIRATORY:  Clear to auscultation without rales, wheezing or rhonchi  ABDOMEN: Soft, non-tender, non-distended. Protuberant with umbilical hernia MUSCULOSKELETAL:  1+ pitting edema bilateral LE; No deformity. 2+ pedal pulses, equal bilaterally SKIN: Warm and dry NEUROLOGIC:  Alert and oriented x 3 PSYCHIATRIC:  Normal affect   EKG:  EKG is not ordered today.  *** No BP recorded.  {Refresh Note OR Click here to enter BP  :1}***    Diagnoses:    No diagnosis  found.  Assessment and Plan:     Leg edema/Acute HFpEF: He has bilateral LE edema that extends up his calves. Seems unaware. Reports symptoms from 04/30/23 for which he went to ED including DOE and abdominal distention have resolved. Reports weight is stable. Volume status is difficult to assess due to body habitus.  No evidence of JVD on exam. Lengthy discussion about diet.  Encouraged low sodium diet < 2000 mg daily.  Encouraged him to take Lasix 20 mg for 2 to 3 days to see if leg edema improves.  We will get echocardiogram for evaluation of heart function.   CAD without angina: NSTEMI with PCI/DES to mLAD 07/2020 with residual disease in 1st diag (60%) and dist LAD (40%). ED visit a few days prior with DOE. Reports this has resolved. He denies chest pain, dyspnea, or other symptoms concerning for angina. Will evaluate for wall motion abnormality on echo. Consider further ischemic evaluation if symptoms warrant.  Hyperlipidemia: LDL 60 on 02/22/23. LDL goal now 55 or lower.  Have encouraged less saturated fat and processed foods. Continue atorvastatin.  Hypertension: BP is well controlled.   PPM implant: He underwent generator change out 02/23/2022.  Last office visit with Dr. Ladona Paul, EP cardiologist, was 06/02/2022. There are no remote device checks on file.  Will notify device team.     Disposition: ***  Medication Adjustments/Labs and Tests Ordered: Current medicines are reviewed at length with the patient today.  Concerns regarding medicines are outlined above.  No orders of the defined types were placed in this encounter.  No orders of the defined types were placed in this encounter.   There are no Patient Instructions on file for this visit.   Signed, , Zachary George, NP  07/02/2023 8:49 AM    [facility]

## 2023-07-10 ENCOUNTER — Ambulatory Visit: Payer: Medicare HMO | Admitting: Nurse Practitioner

## 2023-07-18 ENCOUNTER — Ambulatory Visit: Payer: Medicare HMO | Admitting: Internal Medicine

## 2023-07-18 ENCOUNTER — Encounter: Payer: Self-pay | Admitting: Internal Medicine

## 2023-07-18 VITALS — BP 142/68 | HR 60 | Ht 75.0 in | Wt 267.0 lb

## 2023-07-18 DIAGNOSIS — I495 Sick sinus syndrome: Secondary | ICD-10-CM | POA: Diagnosis not present

## 2023-07-18 LAB — CUP PACEART INCLINIC DEVICE CHECK
Battery Remaining Longevity: 123 mo
Battery Voltage: 3.01 V
Brady Statistic RA Percent Paced: 32 %
Brady Statistic RV Percent Paced: 0.05 %
Date Time Interrogation Session: 20240814154807
Implantable Lead Connection Status: 753985
Implantable Lead Connection Status: 753985
Implantable Lead Implant Date: 20120801
Implantable Lead Implant Date: 20120801
Implantable Lead Location: 753859
Implantable Lead Location: 753860
Implantable Pulse Generator Implant Date: 20230329
Lead Channel Impedance Value: 512.5 Ohm
Lead Channel Impedance Value: 525 Ohm
Lead Channel Pacing Threshold Amplitude: 1 V
Lead Channel Pacing Threshold Amplitude: 1 V
Lead Channel Pacing Threshold Amplitude: 1 V
Lead Channel Pacing Threshold Amplitude: 1 V
Lead Channel Pacing Threshold Pulse Width: 0.5 ms
Lead Channel Pacing Threshold Pulse Width: 0.5 ms
Lead Channel Pacing Threshold Pulse Width: 0.5 ms
Lead Channel Pacing Threshold Pulse Width: 0.5 ms
Lead Channel Sensing Intrinsic Amplitude: 10.4 mV
Lead Channel Sensing Intrinsic Amplitude: 4.8 mV
Lead Channel Setting Pacing Amplitude: 2.5 V
Lead Channel Setting Pacing Amplitude: 2.5 V
Lead Channel Setting Pacing Pulse Width: 0.5 ms
Lead Channel Setting Sensing Sensitivity: 2 mV
Pulse Gen Model: 2272
Pulse Gen Serial Number: 8052085

## 2023-07-18 NOTE — Patient Instructions (Signed)

## 2023-07-18 NOTE — Progress Notes (Signed)
HPI Paul Paul returns today for followup. He is a pleasant 67 yo man with symptomatic bradycardia, s/p PPM, HTN, obesity and a cerebral aneurysm, status post PPM insertion.  He denies chest pain or sob. He has been working at [employer]. He is s/p MI/PCI. He denies chest pain or sob. No syncope.  Allergies  Allergen Reactions   Doxycycline Rash and Nausea Only   Lisinopril Rash   Losartan Potassium-Hctz Other (See Comments)    Caused dizziness & fatigue     Current Outpatient Medications  Medication Sig Dispense Refill   albuterol (VENTOLIN HFA) 108 (90 Base) MCG/ACT inhaler Inhale 2 puffs into the lungs every 6 (six) hours as needed for wheezing or shortness of breath (for bronchitis). 1 each 0   aspirin EC 81 MG tablet Take 81 mg by mouth daily.     atorvastatin (LIPITOR) 80 MG tablet Take 1 tablet (80 mg total) by mouth daily. 90 tablet 3   azithromycin (ZITHROMAX Z-PAK) 250 MG tablet Take two tablets on day one, followed by one tablet daily for the next four days. 6 tablet 0   furosemide (LASIX) 20 MG tablet Take 1 tablet (20 mg total) by mouth daily as needed for fluid or edema. 7 tablet 0   glimepiride (AMARYL) 4 MG tablet TAKE 2 TABLETS EVERY DAY BEFORE BREAKFAST 180 tablet 3   insulin aspart protamine - aspart (NOVOLOG 70/30 FLEXPEN) (70-30) 100 UNIT/ML FlexPen Inject 20-30 Units into the skin 2 (two) times daily. 18 mL 11   Insulin Pen Needle 31G X 8 MM MISC 1 each by Does not apply route 2 (two) times daily before a meal. 100 each 11   metoprolol tartrate (LOPRESSOR) 25 MG tablet TAKE 1 TABLET EVERY DAY 90 tablet 2   nitroGLYCERIN (NITROSTAT) 0.4 MG SL tablet Place 1 tablet (0.4 mg total) under the tongue every 5 (five) minutes as needed for chest pain (up to 3 doses. call for help if doesnt resolve after 1 dose.). 30 tablet 3   No current facility-administered medications for this visit.     Past Medical History:  Diagnosis Date   Anxiety    Arthritis    Bell palsy     > 30 years. Left side of face droops a little   Bradycardia    Cerebral aneurysm    Cerebral aneurysm without rupture 06/2011   Middle of Cerebral Artery   Complication of anesthesia 2012   after surgery for cliping of cerebral anerysum- In the room, patient was shaking, and patient would awaken and then "go back out" shaking continued after he "went back out:   Constipation    Diabetes mellitus    now under control   Dysrhythmia    BRADYCARDIA REQUIRING PACEMAKER .FOLLOWED BY Paul Paul   HYPERTENSION 12/10/2007   Pacemaker    Shoulder pain    L SHOULDER. RECENT FALL   Sleep apnea    CPAP.NOT WEARING NOW.NEEDS REPLACEMENT PIECE    ROS:   All systems reviewed and negative except as noted in the HPI.   Past Surgical History:  Procedure Laterality Date   CARDIAC CATHETERIZATION  07/06/11   normal   CEREBRAL ANEURYSM REPAIR  06/2011   "stenting and coiling" per patient   CORONARY STENT INTERVENTION N/A 07/21/2020   Procedure: CORONARY STENT INTERVENTION;  Surgeon: Iran Ouch, MD;  Location: [facility] INVASIVE CV LAB;  Service: Cardiovascular;  Laterality: N/A;   FRACTURE SURGERY Left    foot  INSERT / REPLACE / REMOVE PACEMAKER  2012   KNEE SURGERY Left    left; arthoscopic   LEFT HEART CATH AND CORONARY ANGIOGRAPHY N/A 07/21/2020   Procedure: LEFT HEART CATH AND CORONARY ANGIOGRAPHY;  Surgeon: Iran Ouch, MD;  Location: [facility] INVASIVE CV LAB;  Service: Cardiovascular;  Laterality: N/A;   PPM GENERATOR CHANGEOUT N/A 02/23/2022   Procedure: PPM GENERATOR CHANGEOUT;  Surgeon: Marinus Maw, MD;  Location: [facility] INVASIVE CV LAB;  Service: Cardiovascular;  Laterality: N/A;   rotator cuff surgery Left 02/2012   septal deviation     SHOULDER ARTHROSCOPY WITH SUBACROMIAL DECOMPRESSION, ROTATOR CUFF REPAIR AND BICEP TENDON REPAIR Right 08/24/2015   Procedure: RIGHT SHOULDER DIAGNOSTIC OPERATIVE ARTHROSCOPY WITH DEBRIDEMENT, SUBACROMIAL DECOMPRESSION, MIMI-OPEN ROTATOR CUFF TEAR  REPAIR AND POSSIBLE  BICEPS TENODESIS.  ;  Surgeon: Cammy Copa, MD;  Location: [facility] OR;  Service: Orthopedics;  Laterality: Right;  RIGHT SHOULDER DIAGNOSTIC OPERATIVE ARTHROSCOPY, DEBRIDEMENT, SUBACROMIAL DECOMPRESSION, MINI-OPEN ROTATOR CUFF TEAR REPAIR, POSSIB     Family History  Problem Relation Age of Onset   Heart attack Mother        died 16, smoker   Heart disease Mother    Diabetes Mother    Heart attack Father        died 39, smoker   Heart disease Father      Social History   Socioeconomic History   Marital status: Married    Spouse name: Not on file   Number of children: Not on file   Years of education: Not on file   Highest education level: Not on file  Occupational History   Occupation: Disabled  Tobacco Use   Smoking status: Former    Current packs/day: 0.00    Average packs/day: 1 pack/day for 20.0 years (20.0 ttl pk-yrs)    Types: Cigarettes    Start date: 07/05/1991    Quit date: 07/05/2011    Years since quitting: 12.0   Smokeless tobacco: Never  Vaping Use   Vaping status: Never Used  Substance and Sexual Activity   Alcohol use: No    Alcohol/week: 0.0 standard drinks of alcohol   Drug use: No   Sexual activity: Not on file  Other Topics Concern   Not on file  Social History Narrative   Married over 30 years in 2015. 1 step son. 1 grandson.       Disabled from brain aneurysm, pacemaker. Retired from [employer] years.       Hobbies: grandson, exercise at [name]   Social Determinants of Health   Financial Resource Strain: Low Risk  (11/06/2022)   Overall Financial Resource Strain (CARDIA)    Difficulty of Paying Living Expenses: Not hard at all  Food Insecurity: No Food Insecurity (11/06/2022)   Hunger Vital Sign    Worried About Running Out of Food in the Last Year: Never true    Ran Out of Food in the Last Year: Never true  Transportation Needs: No Transportation Needs (11/06/2022)   PRAPARE - Scientist, research (physical sciences) (Medical): No    Lack of Transportation (Non-Medical): No  Physical Activity: Sufficiently Active (11/06/2022)   Exercise Vital Sign    Days of Exercise per Week: 5 days    Minutes of Exercise per Session: 60 min  Stress: No Stress Concern Present (11/06/2022)   [employer] of Occupational Health - Occupational Stress Questionnaire    Feeling of Stress : Only a little  Recent Concern: Stress -  Stress Concern Present (10/31/2022)   [employer] of Occupational Health - Occupational Stress Questionnaire    Feeling of Stress : To some extent  Social Connections: Moderately Integrated (11/06/2022)   Social Connection and Isolation Panel [NHANES]    Frequency of Communication with Friends and Family: More than three times a week    Frequency of Social Gatherings with Friends and Family: More than three times a week    Attends Religious Services: More than 4 times per year    Active Member of [employer] or Organizations: No    Attends Banker Meetings: Never    Marital Status: Married  Catering manager Violence: Not At Risk (11/06/2022)   Humiliation, Afraid, Rape, and Kick questionnaire    Fear of Current or Ex-Partner: No    Emotionally Abused: No    Physically Abused: No    Sexually Abused: No     BP (!) 142/68   Pulse 60   Ht 6\' 3"  (1.905 m)   Wt 267 lb (121.1 kg)   SpO2 96%   BMI 33.37 kg/m   Physical Exam:  Well appearing NAD HEENT: Unremarkable Neck:  No JVD, no thyromegally Lymphatics:  No adenopathy Back:  No CVA tenderness Lungs:  Clear with no wheezes HEART:  Regular rate rhythm, no murmurs, no rubs, no clicks Abd:  soft, positive bowel sounds, no organomegally, no rebound, no guarding Ext:  2 plus pulses, no edema, no cyanosis, no clubbing Skin:  No rashes no nodules Neuro:  CN II through XII intact, motor grossly intact  DEVICE  Normal device function.  See PaceArt for details.   Assess/Plan:  1.Sinus node dysfunction - he  is s/p PPM insertion.  2. CAD - he is s/p recent anterior MI. He denies anginal symptoms and feels well. 3. Obesity - his weight is neither up or down.  4. PPM - his St. Jude DDD PM is working normally. We will recheck in several months.   Sharlot Gowda Katia Hannen,MD

## 2023-07-24 ENCOUNTER — Ambulatory Visit: Payer: Medicare HMO | Admitting: Nurse Practitioner

## 2023-08-09 DIAGNOSIS — R3912 Poor urinary stream: Secondary | ICD-10-CM | POA: Diagnosis not present

## 2023-08-09 DIAGNOSIS — R3914 Feeling of incomplete bladder emptying: Secondary | ICD-10-CM | POA: Diagnosis not present

## 2023-08-16 ENCOUNTER — Other Ambulatory Visit: Payer: Self-pay | Admitting: Family Medicine

## 2023-08-16 ENCOUNTER — Other Ambulatory Visit: Payer: Self-pay | Admitting: Internal Medicine

## 2023-09-18 ENCOUNTER — Telehealth: Payer: Self-pay | Admitting: Internal Medicine

## 2023-09-18 NOTE — Telephone Encounter (Signed)
Spoke with Pt.  Pt  notices SOB with exertion.  Pt with SND.  Histograms are flat and left leaning.  Discussed rate response.  May be helpful for Pt.  Device follow up made.  Will discuss with  GT.

## 2023-09-18 NOTE — Telephone Encounter (Signed)
Pt c/o Shortness Of Breath: STAT if SOB developed within the last 24 hours or pt is noticeably SOB on the phone  1. Are you currently SOB (can you hear that pt is SOB on the phone)? No   2. How long have you been experiencing SOB? A couple of months   3. Are you SOB when sitting or when up moving around? Moving around   4. Are you currently experiencing any other symptoms? No, patient states that he has occasional issues with SOB and wants to know if this could be a pacemaker issue. Requesting call back to discuss this further.

## 2023-09-20 ENCOUNTER — Ambulatory Visit: Payer: Medicare HMO

## 2023-09-20 ENCOUNTER — Ambulatory Visit: Payer: Medicare HMO | Attending: Cardiology

## 2023-09-20 DIAGNOSIS — I495 Sick sinus syndrome: Secondary | ICD-10-CM

## 2023-09-20 LAB — CUP PACEART INCLINIC DEVICE CHECK
Date Time Interrogation Session: 20241017091147
Implantable Lead Connection Status: 753985
Implantable Lead Connection Status: 753985
Implantable Lead Implant Date: 20120801
Implantable Lead Implant Date: 20120801
Implantable Lead Location: 753859
Implantable Lead Location: 753860
Implantable Pulse Generator Implant Date: 20230329
Pulse Gen Model: 2272
Pulse Gen Serial Number: 8052085

## 2023-09-20 NOTE — Progress Notes (Signed)
Pt seen in device clinic for reprogramming. LRL increased to 70 bpm.  Rate response turned on. Pt will contact device clinic if any further changes needed.

## 2023-09-20 NOTE — Patient Instructions (Signed)
Follow up as scheduled.

## 2023-10-02 ENCOUNTER — Ambulatory Visit (INDEPENDENT_AMBULATORY_CARE_PROVIDER_SITE_OTHER): Payer: Medicare HMO

## 2023-10-02 DIAGNOSIS — Z23 Encounter for immunization: Secondary | ICD-10-CM | POA: Diagnosis not present

## 2023-10-15 ENCOUNTER — Ambulatory Visit (INDEPENDENT_AMBULATORY_CARE_PROVIDER_SITE_OTHER): Payer: Medicare HMO | Admitting: Family Medicine

## 2023-10-15 ENCOUNTER — Encounter: Payer: Self-pay | Admitting: Family Medicine

## 2023-10-15 VITALS — BP 112/70 | HR 70 | Temp 97.7°F | Ht 75.0 in | Wt 267.2 lb

## 2023-10-15 DIAGNOSIS — I10 Essential (primary) hypertension: Secondary | ICD-10-CM

## 2023-10-15 DIAGNOSIS — Z794 Long term (current) use of insulin: Secondary | ICD-10-CM

## 2023-10-15 DIAGNOSIS — I2510 Atherosclerotic heart disease of native coronary artery without angina pectoris: Secondary | ICD-10-CM | POA: Diagnosis not present

## 2023-10-15 DIAGNOSIS — E1165 Type 2 diabetes mellitus with hyperglycemia: Secondary | ICD-10-CM

## 2023-10-15 MED ORDER — NOVOLOG 70/30 FLEXPEN RELION (70-30) 100 UNIT/ML ~~LOC~~ SUPN: 30.0000 [IU] | PEN_INJECTOR | Freq: Two times a day (BID) | SUBCUTANEOUS | 11 refills | Status: DC

## 2023-10-15 NOTE — Progress Notes (Signed)
Phone [phone] In person visit   Subjective:   Paul Paul is a 67 y.o. year old very pleasant male patient who presents for/with See problem oriented charting Chief Complaint  Patient presents with   Medical Management of Chronic Issues   Diabetes    Pt c/o sugars still running 200 and over, this morning it was 280 fasting. DEE not done or scheduled.   Past Medical History-  Patient Active Problem List   Diagnosis Date Noted   CAD (coronary artery disease) status post drug-eluting stent mid LAD August 2021 07/28/2020    Priority: High   Sinus node dysfunction (HCC) 08/05/2019    Priority: High   Cerebral aneurysm without rupture 01/01/2012    Priority: High   Pacemaker 10/07/2011    Priority: High   Poorly controlled type 2 diabetes mellitus (HCC) 11/09/2008    Priority: High   Aortic atherosclerosis (HCC) 07/28/2020    Priority: Medium    Fatty liver 06/18/2018    Priority: Medium    Hepatotoxicity due to statin drug 10/19/2017    Priority: Medium    Elevated LFTs 08/09/2017    Priority: Medium    GAD (generalized anxiety disorder) 10/11/2016    Priority: Medium    OSA on CPAP 09/24/2014    Priority: Medium    Hyperlipidemia associated with type 2 diabetes mellitus (HCC) 09/24/2014    Priority: Medium    Other specified cardiac dysrhythmias(427.89) 07/09/2012    Priority: Medium    Essential hypertension 12/10/2007    Priority: Medium    Obesity 01/01/2014    Priority: Low   Chronic pruritic rash in adult 08/09/2017    Medications- reviewed and updated Current Outpatient Medications  Medication Sig Dispense Refill   albuterol (VENTOLIN HFA) 108 (90 Base) MCG/ACT inhaler Inhale 2 puffs into the lungs every 6 (six) hours as needed for wheezing or shortness of breath (for bronchitis). 1 each 0   aspirin EC 81 MG tablet Take 81 mg by mouth daily.     atorvastatin (LIPITOR) 80 MG tablet TAKE 1 TABLET EVERY DAY 90 tablet 3   furosemide (LASIX) 20 MG tablet  Take 1 tablet (20 mg total) by mouth daily as needed for fluid or edema. 7 tablet 0   glimepiride (AMARYL) 4 MG tablet TAKE 2 TABLETS EVERY DAY BEFORE BREAKFAST 180 tablet 3   Insulin Pen Needle 31G X 8 MM MISC 1 each by Does not apply route 2 (two) times daily before a meal. 100 each 11   metoprolol tartrate (LOPRESSOR) 25 MG tablet TAKE 1 TABLET EVERY DAY 90 tablet 3   nitroGLYCERIN (NITROSTAT) 0.4 MG SL tablet Place 1 tablet (0.4 mg total) under the tongue every 5 (five) minutes as needed for chest pain (up to 3 doses. call for help if doesnt resolve after 1 dose.). 30 tablet 3   insulin aspart protamine - aspart (NOVOLOG 70/30 FLEXPEN) (70-30) 100 UNIT/ML FlexPen Inject 30-40 Units into the skin 2 (two) times daily. 21 mL 11   No current facility-administered medications for this visit.     Objective:  BP 112/70   Pulse 70   Temp 97.7 F (36.5 C)   Ht 6\' 3"  (1.905 m)   Wt 267 lb 3.2 oz (121.2 kg)   SpO2 95%   BMI 33.40 kg/m  Gen: NAD, resting comfortably     Assessment and Plan   #HM- advised cologuard- he declines  %History of cerebral aneurysm without rupture. Follows with [place] radiology - in  2012 had 2 stents and coils.  -he agrees to call to schedule with Dr. Corliss Skains but apparently needs sugar better first  #Diabetes mellitus-poor control. Metformin intolerant- GI.  S: compliant with 8mg  glimepiride daily,-NovoLog 70/30 with 30 units in morning and 30 in evening. 180 is lowest he has seen -sugars still usually at least 200- this morning up to 280 - has not done diabetic eye exam but encouraged today -no low blood sugars Lab Results  Component Value Date   HGBA1C 11.9 (H) 02/22/2023   HGBA1C 10.9 (A) 09/15/2022   HGBA1C 13.4 (H) 01/12/2022  A/P: diabetes poorly controlled last check- update a1c today- from sugars I can tell poorly controlled but hopefully improving- increase to 34 units in morning and 30 units in evening- update me in 2 weeks and see me in 4-6 weeks  so we can keep working this down   #Hypertension S: Controlled on metoprolol 25 mg twice daily BP Readings from Last 3 Encounters:  10/15/23 112/70  07/18/23 (!) 142/68  05/29/23 126/70  A/P: stable- continue current medicines   #CAD  #Hyperlipidemia/elevated LFTs S: Medication: Atorvastatin 80 mg, aspirin 81 mg -no chest pain or shortness of breath  Lab Results  Component Value Date   CHOL 139 02/22/2023   HDL 49.10 02/22/2023   LDLCALC 60 02/22/2023   LDLDIRECT 104.0 08/09/2017   TRIG 154.0 (H) 02/22/2023   CHOLHDL 3 02/22/2023  A/P: coronary artery disease asymptomatic continue current medications Lipids doing well and last LFTs ok- monitor today   #pacemaker due to history symptomatic bradycardia S: Patient follows with Dr. Ladona Ridgel.  He has a pacemaker due to history of symptomatic bradycardia- did have device adjustments in October that were helpful- increased lower rate to 70 A/P: doing reasonably well- continue current medications   Recommended follow up: Return in about 1 month (around 11/14/2023) for followup or sooner if needed.Schedule b4 you leave. Future Appointments  Date Time Provider Department Center  11/07/2023  8:00 AM [facility] ANNUAL WELLNESS VISIT 1 [facility]    Lab/Order associations:   ICD-10-CM   1. Poorly controlled type 2 diabetes mellitus (HCC)  E11.65 Hemoglobin A1c    Comprehensive metabolic panel    CBC with Differential/Platelet    2. Coronary artery disease involving native coronary artery of native heart without angina pectoris  I25.10     3. Essential hypertension  I10      Meds ordered this encounter  Medications   insulin aspart protamine - aspart (NOVOLOG 70/30 FLEXPEN) (70-30) 100 UNIT/ML FlexPen    Sig: Inject 30-40 Units into the skin 2 (two) times daily.    Dispense:  21 mL    Refill:  11    Trying to prescribe relion flex pen- around $85 for 15 ml I believe   Return precautions advised.  Tana Conch, MD

## 2023-10-15 NOTE — Patient Instructions (Addendum)
diabetes poorly controlled last check- update a1c today- from sugars I can tell poorly controlled but hopefully improving- increase to 34 units in morning and 30 units in evening- update me in 2 weeks and see me in 4-6 weeks so we can keep working this down  Get your diabetes eye exam  Please stop by lab before you go If you have mychart- we will send your results within 3 business days of [place] receiving them.  If you do not have mychart- we will call you about results within 5 business days of [place] receiving them.  *please also note that you will see labs on mychart as soon as they post. I will later go in and write notes on them- will say "notes from Dr. Durene Cal"   Recommended follow up: Return in about 1 month (around 11/14/2023) for followup or sooner if needed.Schedule b4 you leave.

## 2023-10-16 ENCOUNTER — Telehealth: Payer: Self-pay | Admitting: Family Medicine

## 2023-10-16 LAB — COMPREHENSIVE METABOLIC PANEL
ALT: 48 U/L (ref 0–53)
AST: 41 U/L — ABNORMAL HIGH (ref 0–37)
Albumin: 3.9 g/dL (ref 3.5–5.2)
Alkaline Phosphatase: 73 U/L (ref 39–117)
BUN: 16 mg/dL (ref 6–23)
CO2: 25 meq/L (ref 19–32)
Calcium: 10 mg/dL (ref 8.4–10.5)
Chloride: 100 meq/L (ref 96–112)
Creatinine, Ser: 0.88 mg/dL (ref 0.40–1.50)
GFR: 88.34 mL/min (ref >60.00)
Glucose, Bld: 221 mg/dL — ABNORMAL HIGH (ref 70–99)
Potassium: 4.1 meq/L (ref 3.5–5.1)
Sodium: 133 meq/L — ABNORMAL LOW (ref 135–145)
Total Bilirubin: 0.9 mg/dL (ref 0.2–1.2)
Total Protein: 8.2 g/dL (ref 6.0–8.3)

## 2023-10-16 LAB — CBC WITH DIFFERENTIAL/PLATELET
Basophils Absolute: 0.1 10*3/uL (ref 0.0–0.1)
Basophils Relative: 1.2 % (ref 0.0–3.0)
Eosinophils Absolute: 0.2 10*3/uL (ref 0.0–0.7)
Eosinophils Relative: 3.1 % (ref 0.0–5.0)
HCT: 45.3 % (ref 39.0–52.0)
Hemoglobin: 15.6 g/dL (ref 13.0–17.0)
Lymphocytes Relative: 38.4 % (ref 12.0–46.0)
Lymphs Abs: 2 10*3/uL (ref 0.7–4.0)
MCHC: 34.5 g/dL (ref 30.0–36.0)
MCV: 91.3 fL (ref 78.0–100.0)
Monocytes Absolute: 0.4 10*3/uL (ref 0.1–1.0)
Monocytes Relative: 8.2 % (ref 3.0–12.0)
Neutro Abs: 2.6 10*3/uL (ref 1.4–7.7)
Neutrophils Relative %: 49.1 % (ref 43.0–77.0)
Platelets: 185 10*3/uL (ref 150.0–400.0)
RBC: 4.96 Mil/uL (ref 4.22–5.81)
RDW: 13.6 % (ref 11.5–15.5)
WBC: 5.2 10*3/uL (ref 4.0–10.5)

## 2023-10-16 LAB — HEMOGLOBIN A1C: Hgb A1c MFr Bld: 11.6 % — ABNORMAL HIGH (ref 4.6–6.5)

## 2023-10-16 NOTE — Telephone Encounter (Signed)
Patient requests to be called to be given lab results 

## 2023-10-17 NOTE — Telephone Encounter (Signed)
Patient contacted and given lab results and recommendations. Patient stated understanding and did not have any questions or concerns at this time.  ? ?

## 2023-10-23 ENCOUNTER — Other Ambulatory Visit: Payer: Self-pay | Admitting: Family Medicine

## 2023-11-05 ENCOUNTER — Encounter: Payer: Self-pay | Admitting: Family

## 2023-11-05 ENCOUNTER — Ambulatory Visit (INDEPENDENT_AMBULATORY_CARE_PROVIDER_SITE_OTHER): Payer: Medicare HMO | Admitting: Family

## 2023-11-05 DIAGNOSIS — R053 Chronic cough: Secondary | ICD-10-CM | POA: Diagnosis not present

## 2023-11-05 DIAGNOSIS — R509 Fever, unspecified: Secondary | ICD-10-CM | POA: Diagnosis not present

## 2023-11-05 LAB — POC COVID19 BINAXNOW: SARS Coronavirus 2 Ag: NEGATIVE

## 2023-11-05 LAB — POCT INFLUENZA A/B
Influenza A, POC: NEGATIVE
Influenza B, POC: NEGATIVE

## 2023-11-05 MED ORDER — AZITHROMYCIN 250 MG PO TABS
ORAL_TABLET | ORAL | 0 refills | Status: AC
Start: 2023-11-05 — End: 2023-11-10

## 2023-11-05 MED ORDER — PREDNISONE 10 MG PO TABS
ORAL_TABLET | ORAL | 0 refills | Status: DC
Start: 2023-11-05 — End: 2023-11-19

## 2023-11-05 NOTE — Progress Notes (Signed)
Patient ID: Paul Paul, male    DOB: May 21, 1956, 67 y.o.   MRN: [id number]  Chief Complaint  Patient presents with   Cough    Started 5 days ago    congestion    Discussed the use of AI scribe software for clinical note transcription with the patient, who gave verbal consent to proceed.  History of Present Illness   The patient presents with a recurrent cough that he reports occurs annually. The cough progresses into bronchitis and then pneumonia. He notes that he is currently producing mucus, particularly around 4 o'clock in the morning. He denies any sinus symptoms and reports using over-the-counter Mucinex to manage his symptoms. He denies any shortness of breath or chest tightness. He has found relief from his symptoms in the past with a Z-Pak and prednisone, which he last used 6 mos ago. The patient received his flu shot.           Assessment & Plan:     Recurrent Cough - Rapid covid & flu testing negative. Biannual episodes of cough progressing in the past to bronchitis and pneumonia. Currently with productive cough and throat irritation. Lungs clear on auscultation. No shortness of breath or chest tightness. Lungs clear on auscultation. -Prescribed Z-Pak and Prednisone as these have been effective in the past, advised pt on being proactive with symptoms in the future to avoid needing continued antibiotics & steroids. -Advised over-the-counter generic Flonase or Nasacort for congestion and sinus sx. -Encourage hydration, 2L water qd. -Check in if symptoms do not improve after completing medications.  Cerumen Impaction - Bilateral ear wax accumulation. No history of using ear drops. -Recommend over-the-counter Dbrox eardrops or mineral oil for a few nights to soften wax. -Advised to get water in ears and shake it out after using drops for 3 nights.     Subjective:    Outpatient Medications Prior to Visit  Medication Sig Dispense Refill   albuterol (VENTOLIN HFA) 108 (90 Base)  MCG/ACT inhaler Inhale 2 puffs into the lungs every 6 (six) hours as needed for wheezing or shortness of breath (for bronchitis). 1 each 0   aspirin EC 81 MG tablet Take 81 mg by mouth daily.     atorvastatin (LIPITOR) 80 MG tablet TAKE 1 TABLET EVERY DAY 90 tablet 3   furosemide (LASIX) 20 MG tablet Take 1 tablet (20 mg total) by mouth daily as needed for fluid or edema. 7 tablet 0   glimepiride (AMARYL) 4 MG tablet TAKE 2 TABLETS EVERY DAY BEFORE BREAKFAST 180 tablet 3   insulin aspart protamine - aspart (NOVOLOG 70/30 FLEXPEN) (70-30) 100 UNIT/ML FlexPen Inject 30-40 Units into the skin 2 (two) times daily. 21 mL 11   metoprolol tartrate (LOPRESSOR) 25 MG tablet TAKE 1 TABLET EVERY DAY 90 tablet 3   MM PEN NEEDLES 31G X 8 MM MISC 1  TWICE DAILY BEFORE MEAL(S) 100 each 0   nitroGLYCERIN (NITROSTAT) 0.4 MG SL tablet Place 1 tablet (0.4 mg total) under the tongue every 5 (five) minutes as needed for chest pain (up to 3 doses. call for help if doesnt resolve after 1 dose.). 30 tablet 3   No facility-administered medications prior to visit.   Past Medical History:  Diagnosis Date   Anxiety    Arthritis    Bell palsy    > 30 years. Left side of face droops a little   Bradycardia    Cerebral aneurysm    Cerebral aneurysm without rupture 06/2011  Middle of Cerebral Artery   Complication of anesthesia 2012   after surgery for cliping of cerebral anerysum- In the room, patient was shaking, and patient would awaken and then "go back out" shaking continued after he "went back out:   Constipation    Diabetes mellitus    now under control   Dysrhythmia    BRADYCARDIA REQUIRING PACEMAKER .FOLLOWED BY [facility]   HYPERTENSION 12/10/2007   Pacemaker    Shoulder pain    L SHOULDER. RECENT FALL   Sleep apnea    CPAP.NOT WEARING NOW.NEEDS REPLACEMENT PIECE   Past Surgical History:  Procedure Laterality Date   CARDIAC CATHETERIZATION  07/06/11   normal   CEREBRAL ANEURYSM REPAIR  06/2011    "stenting and coiling" per patient   CORONARY STENT INTERVENTION N/A 07/21/2020   Procedure: CORONARY STENT INTERVENTION;  Surgeon: Iran Ouch, MD;  Location: [facility];  Service: Cardiovascular;  Laterality: N/A;   FRACTURE SURGERY Left    foot   INSERT / REPLACE / REMOVE PACEMAKER  2012   KNEE SURGERY Left    left; arthoscopic   LEFT HEART CATH AND CORONARY ANGIOGRAPHY N/A 07/21/2020   Procedure: LEFT HEART CATH AND CORONARY ANGIOGRAPHY;  Surgeon: Iran Ouch, MD;  Location: [facility];  Service: Cardiovascular;  Laterality: N/A;   PPM GENERATOR CHANGEOUT N/A 02/23/2022   Procedure: PPM GENERATOR CHANGEOUT;  Surgeon: Marinus Maw, MD;  [facility];  Service: Cardiovascular;  Laterality: N/A;   rotator cuff surgery Left 02/2012   septal deviation     SHOULDER ARTHROSCOPY WITH SUBACROMIAL DECOMPRESSION, ROTATOR CUFF REPAIR AND BICEP TENDON REPAIR Right 08/24/2015   Procedure: RIGHT SHOULDER DIAGNOSTIC OPERATIVE ARTHROSCOPY WITH DEBRIDEMENT, SUBACROMIAL DECOMPRESSION, MIMI-OPEN ROTATOR CUFF TEAR REPAIR AND POSSIBLE  BICEPS TENODESIS.  ;  Surgeon: Cammy Copa, MD;  Location: [facility] OR;  Service: Orthopedics;  Laterality: Right;  RIGHT SHOULDER DIAGNOSTIC OPERATIVE ARTHROSCOPY, DEBRIDEMENT, SUBACROMIAL DECOMPRESSION, MINI-OPEN ROTATOR CUFF TEAR REPAIR, POSSIB   Allergies  Allergen Reactions   Doxycycline Rash and Nausea Only   Lisinopril Rash   Losartan Potassium-Hctz Other (See Comments)    Caused dizziness & fatigue      Objective:    Physical Exam Vitals and nursing note reviewed.  Constitutional:      General: He is not in acute distress.    Appearance: Normal appearance.  HENT:     Head: Normocephalic.     Right Ear: There is impacted cerumen.     Left Ear: There is impacted cerumen.     Mouth/Throat:     Mouth: Mucous membranes are moist.     Pharynx: Posterior oropharyngeal erythema and postnasal drip present. No pharyngeal  swelling or oropharyngeal exudate.     Tonsils: No tonsillar exudate or tonsillar abscesses.  Cardiovascular:     Rate and Rhythm: Normal rate and regular rhythm.  Pulmonary:     Effort: Pulmonary effort is normal.     Breath sounds: Normal breath sounds.  Musculoskeletal:        General: Normal range of motion.     Cervical back: Normal range of motion.  Lymphadenopathy:     Head:     Right side of head: No tonsillar, preauricular, posterior auricular or occipital adenopathy.     Left side of head: No tonsillar, preauricular, posterior auricular or occipital adenopathy.     Cervical: No cervical adenopathy.  Skin:    General: Skin is warm and dry.  Neurological:     Mental Status: He is alert and oriented to person, place, and time.  Psychiatric:        Mood and Affect: Mood normal.    BP 130/72   Pulse 70   Temp 98.1 F (36.7 C) (Temporal)   Ht 6\' 3"  (1.905 m)   Wt 266 lb 3.2 oz (120.7 kg)   SpO2 96%   BMI 33.27 kg/m  Wt Readings from Last 3 Encounters:  11/05/23 266 lb 3.2 oz (120.7 kg)  10/15/23 267 lb 3.2 oz (121.2 kg)  07/18/23 267 lb (121.1 kg)      Dulce Sellar, NP

## 2023-11-19 ENCOUNTER — Ambulatory Visit (INDEPENDENT_AMBULATORY_CARE_PROVIDER_SITE_OTHER): Payer: Medicare HMO | Admitting: Family Medicine

## 2023-11-19 ENCOUNTER — Encounter: Payer: Self-pay | Admitting: Family Medicine

## 2023-11-19 VITALS — BP 138/70 | HR 72 | Temp 97.9°F | Ht 75.0 in | Wt 272.0 lb

## 2023-11-19 DIAGNOSIS — Z794 Long term (current) use of insulin: Secondary | ICD-10-CM

## 2023-11-19 DIAGNOSIS — I10 Essential (primary) hypertension: Secondary | ICD-10-CM

## 2023-11-19 DIAGNOSIS — E1165 Type 2 diabetes mellitus with hyperglycemia: Secondary | ICD-10-CM | POA: Diagnosis not present

## 2023-11-19 DIAGNOSIS — Z7984 Long term (current) use of oral hypoglycemic drugs: Secondary | ICD-10-CM | POA: Diagnosis not present

## 2023-11-19 NOTE — Patient Instructions (Addendum)
Get diabetic eye exam scheduled.  Declines cologuard and colonoscopy  increase novolog 70/30 to 38 units in am and keep 30 units in pm and update me with morning sugars in 3 weeks and see me in 6 weeks.   Recommended follow up: Return in about 6 weeks (around 12/31/2023) for followup or sooner if needed.Schedule b4 you leave.

## 2023-11-19 NOTE — Progress Notes (Signed)
Phone [phone] In person visit   Subjective:   Paul Paul is a 67 y.o. year old very pleasant male patient who presents for/with See problem oriented charting Chief Complaint  Patient presents with   Medical Management of Chronic Issues   Diabetes   Past Medical History-  Patient Active Problem List   Diagnosis Date Noted   CAD (coronary artery disease) status post drug-eluting stent mid LAD August 2021 07/28/2020    Priority: High   Sinus node dysfunction (HCC) 08/05/2019    Priority: High   Cerebral aneurysm without rupture 01/01/2012    Priority: High   Pacemaker 10/07/2011    Priority: High   Poorly controlled type 2 diabetes mellitus (HCC) 11/09/2008    Priority: High   Aortic atherosclerosis (HCC) 07/28/2020    Priority: Medium    Fatty liver 06/18/2018    Priority: Medium    Hepatotoxicity due to statin drug 10/19/2017    Priority: Medium    Elevated LFTs 08/09/2017    Priority: Medium    GAD (generalized anxiety disorder) 10/11/2016    Priority: Medium    OSA on CPAP 09/24/2014    Priority: Medium    Hyperlipidemia associated with type 2 diabetes mellitus (HCC) 09/24/2014    Priority: Medium    Other specified cardiac dysrhythmias(427.89) 07/09/2012    Priority: Medium    Essential hypertension 12/10/2007    Priority: Medium    Obesity 01/01/2014    Priority: Low   Chronic pruritic rash in adult 08/09/2017    Medications- reviewed and updated Current Outpatient Medications  Medication Sig Dispense Refill   albuterol (VENTOLIN HFA) 108 (90 Base) MCG/ACT inhaler Inhale 2 puffs into the lungs every 6 (six) hours as needed for wheezing or shortness of breath (for bronchitis). 1 each 0   aspirin EC 81 MG tablet Take 81 mg by mouth daily.     atorvastatin (LIPITOR) 80 MG tablet TAKE 1 TABLET EVERY DAY 90 tablet 3   furosemide (LASIX) 20 MG tablet Take 1 tablet (20 mg total) by mouth daily as needed for fluid or edema. 7 tablet 0   glimepiride (AMARYL)  4 MG tablet TAKE 2 TABLETS EVERY DAY BEFORE BREAKFAST 180 tablet 3   insulin aspart protamine - aspart (NOVOLOG 70/30 FLEXPEN) (70-30) 100 UNIT/ML FlexPen Inject 30-40 Units into the skin 2 (two) times daily. 21 mL 11   metoprolol tartrate (LOPRESSOR) 25 MG tablet TAKE 1 TABLET EVERY DAY 90 tablet 3   MM PEN NEEDLES 31G X 8 MM MISC 1  TWICE DAILY BEFORE MEAL(S) 100 each 0   nitroGLYCERIN (NITROSTAT) 0.4 MG SL tablet Place 1 tablet (0.4 mg total) under the tongue every 5 (five) minutes as needed for chest pain (up to 3 doses. call for help if doesnt resolve after 1 dose.). 30 tablet 3   No current facility-administered medications for this visit.     Objective:  BP 138/70   Pulse 72   Temp 97.9 F (36.6 C)   Ht 6\' 3"  (1.905 m)   Wt 272 lb (123.4 kg)   SpO2 96%   BMI 34.00 kg/m  Gen: NAD, resting comfortably Diastasis recti noted- discussed what these means     Assessment and Plan   #Diabetes mellitus-poor control. Metformin intolerant- GI. S: compliant with 8mg  glimepiride daily, NovoLog 70/30 with 34 units in am and 30 units in pm  -sugars have come down some under 170 fasting (trying to exercise more and cut down on sugars) but  for most part still around 200 -started walking some and encouraged him -wife has been helpful and improved healthiness of cooking. Tried to cut down starches Lab Results  Component Value Date   HGBA1C 11.6 (H) 10/15/2023   HGBA1C 11.9 (H) 02/22/2023   HGBA1C 10.9 (A) 09/15/2022  A/P: diabetes improving but still poorly controlled- increase novolog 70/30 to 38 units in am and keep 30 units in pm and update me with morning sugars in 3 weeks and see me in 6 weeks.  - still declines endocrine referral  -jardiance and Ozempic too costly   #Hypertension S: Controlled on metoprolol 25 mg twice daily BP Readings from Last 3 Encounters:  11/19/23 138/70  11/05/23 130/72  10/15/23 112/70  A/P: high acceptable- continue current medications   #Health  maintenance - Declines cologuard and colonoscopy -needs to call Dr. Corliss Skains when sugars get lower  Recommended follow up: Return in about 6 weeks (around 12/31/2023) for followup or sooner if needed.Schedule b4 you leave. Future Appointments  Date Time Provider Department Center  12/12/2023  8:00 AM [facility] ANNUAL WELLNESS VISIT 1 [facility]    Lab/Order associations:   ICD-10-CM   1. Poorly controlled type 2 diabetes mellitus (HCC)  E11.65     2. Current use of insulin (HCC)  Z79.4     3. Long term current use of oral hypoglycemic drug  Z79.84     4. Essential hypertension  I10       No orders of the defined types were placed in this encounter.   Return precautions advised.  Tana Conch, MD

## 2023-12-11 ENCOUNTER — Other Ambulatory Visit: Payer: Self-pay | Admitting: Family Medicine

## 2023-12-12 ENCOUNTER — Ambulatory Visit: Payer: Medicare HMO

## 2023-12-12 VITALS — Wt 272.0 lb

## 2023-12-12 DIAGNOSIS — Z0000 Encounter for general adult medical examination without abnormal findings: Secondary | ICD-10-CM | POA: Diagnosis not present

## 2023-12-12 NOTE — Progress Notes (Deleted)
 Subjective:   Paul Paul is a 68 y.o. male who presents for Medicare Annual/Subsequent preventive examination.  Visit Complete: Virtual I connected with  Paul Paul on 12/12/23 by a audio enabled telemedicine application and verified that I am speaking with the correct person using two identifiers.  Patient Location: Home  Provider Location: Home Office  I discussed the limitations of evaluation and management by telemedicine. The patient expressed understanding and agreed to proceed.  Vital Signs: Because this visit was a virtual/telehealth visit, some criteria may be missing or patient reported. Any vitals not documented were not able to be obtained and vitals that have been documented are patient reported.  Cardiac Risk Factors include: advanced age (>75men, >37 women);hypertension;diabetes mellitus;dyslipidemia;male gender;obesity (BMI >30kg/m2)     Objective:    Today's Vitals   12/12/23 0809  Weight: 272 lb (123.4 kg)   Body mass index is 34 kg/m.     12/12/2023    8:15 AM 05/09/2023    5:44 PM 04/30/2023    7:24 AM 11/06/2022    9:36 AM 10/31/2022   12:58 PM 02/23/2022    7:40 AM 10/24/2021    3:30 PM  Advanced Directives  Does Patient Have a Medical Advance Directive? Yes No No No No No No  Type of Estate Agent of [place];Living will        Copy of Healthcare Power of Attorney in Chart? No - copy requested        Would patient like information on creating a medical advance directive?    No - Patient declined No - Patient declined No - Patient declined No - Patient declined    Current Medications (verified) Outpatient Encounter Medications as of 12/12/2023  Medication Sig   albuterol  (VENTOLIN  HFA) 108 (90 Base) MCG/ACT inhaler Inhale 2 puffs into the lungs every 6 (six) hours as needed for wheezing or shortness of breath (for bronchitis).   aspirin  EC 81 MG tablet Take 81 mg by mouth daily.   atorvastatin  (LIPITOR ) 80 MG tablet TAKE 1  TABLET EVERY DAY   glimepiride  (AMARYL ) 4 MG tablet TAKE 2 TABLETS EVERY DAY BEFORE BREAKFAST   insulin  aspart protamine - aspart (NOVOLOG  70/30 FLEXPEN) (70-30) 100 UNIT/ML FlexPen Inject 30-40 Units into the skin 2 (two) times daily.   metoprolol  tartrate (LOPRESSOR ) 25 MG tablet TAKE 1 TABLET EVERY DAY   MM PEN NEEDLES 31G X 8 MM MISC USE TO CHECK BLOOD GLUCOSE TWICE DAILY BEFORE MEAL(S)   furosemide  (LASIX ) 20 MG tablet Take 1 tablet (20 mg total) by mouth daily as needed for fluid or edema. (Patient not taking: Reported on 12/12/2023)   nitroGLYCERIN  (NITROSTAT ) 0.4 MG SL tablet Place 1 tablet (0.4 mg total) under the tongue every 5 (five) minutes as needed for chest pain (up to 3 doses. call for help if doesnt resolve after 1 dose.). (Patient not taking: Reported on 12/12/2023)   No facility-administered encounter medications on file as of 12/12/2023.    Allergies (verified) Doxycycline , Lisinopril, and Losartan  potassium-hctz   History: Past Medical History:  Diagnosis Date   Anxiety    Arthritis    Bell palsy    > 30 years. Left side of face droops a little   Bradycardia    Cerebral aneurysm    Cerebral aneurysm without rupture 06/2011   Middle of Cerebral Artery   Complication of anesthesia 2012   after surgery for cliping of cerebral anerysum- In the room, patient was shaking, and patient  would awaken and then go back out shaking continued after he went back out:   Constipation    Diabetes mellitus    now under control   Dysrhythmia    BRADYCARDIA REQUIRING PACEMAKER .FOLLOWED BY GREGG TAYLOR   HYPERTENSION 12/10/2007   Pacemaker    Shoulder pain    L SHOULDER. RECENT FALL   Sleep apnea    CPAP.NOT WEARING NOW.NEEDS REPLACEMENT PIECE   Past Surgical History:  Procedure Laterality Date   CARDIAC CATHETERIZATION  07/06/11   normal   CEREBRAL ANEURYSM REPAIR  06/2011   stenting and coiling per patient   CORONARY STENT INTERVENTION N/A 07/21/2020   Procedure: CORONARY  STENT INTERVENTION;  Surgeon: Darron Deatrice LABOR, MD;  Location: [facility] INVASIVE CV LAB;  Service: Cardiovascular;  Laterality: N/A;   FRACTURE SURGERY Left    foot   INSERT / REPLACE / REMOVE PACEMAKER  2012   KNEE SURGERY Left    left; arthoscopic   LEFT HEART CATH AND CORONARY ANGIOGRAPHY N/A 07/21/2020   Procedure: LEFT HEART CATH AND CORONARY ANGIOGRAPHY;  Surgeon: Darron Deatrice LABOR, MD;  Location: [facility]ASIVE CV [facility];  Service: Cardiovascular;  Laterality: N/A;   PPM GENERATOR CHANGEOUT N/A 02/23/2022   Procedure: PPM GENERATOR CHANGEOUT;  Surgeon: Waddell Danelle ORN, MD;  Location: [facility];  Service: Cardiovascular;  Laterality: N/A;   rotator cuff surgery Left 02/2012   septal deviation     SHOULDER ARTHROSCOPY WITH SUBACROMIAL DECOMPRESSION, ROTATOR CUFF REPAIR AND BICEP TENDON REPAIR Right 08/24/2015   Procedure: RIGHT SHOULDER DIAGNOSTIC OPERATIVE ARTHROSCOPY WITH DEBRIDEMENT, SUBACROMIAL DECOMPRESSION, MIMI-OPEN ROTATOR CUFF TEAR REPAIR AND POSSIBLE  BICEPS TENODESIS.  ;  Surgeon: Glendia Cordella Hutchinson, MD;  Location: [facility] OR;  Service: Orthopedics;  Laterality: Right;  RIGHT SHOULDER DIAGNOSTIC OPERATIVE ARTHROSCOPY, DEBRIDEMENT, SUBACROMIAL DECOMPRESSION, MINI-OPEN ROTATOR CUFF TEAR REPAIR, POSSIB   Family History  Problem Relation Age of Onset   Heart attack Mother        died 51, smoker   Heart disease Mother    Diabetes Mother    Heart attack Father        died 67, smoker   Heart disease Father    Social History   Socioeconomic History   Marital status: Married    Spouse name: Not on file   Number of children: Not on file   Years of education: Not on file   Highest education level: Not on file  Occupational History   Occupation: Disabled  Tobacco Use   Smoking status: Former    Current packs/day: 0.00    Average packs/day: 1 pack/day for 20.0 years (20.0 ttl pk-yrs)    Types: Cigarettes    Start date: 07/05/1991    Quit date: 07/05/2011    Years since quitting: 12.4    Smokeless tobacco: Never  Vaping Use   Vaping status: Never Used  Substance and Sexual Activity   Alcohol use: No    Alcohol/week: 0.0 standard drinks of alcohol   Drug use: No   Sexual activity: Not on file  Other Topics Concern   Not on file  Social History Narrative   Married over 30 years in 2015. 1 step son. 1 grandson.       Disabled from brain aneurysm, pacemaker. Retired from [employer] years.       Hobbies: grandson, exercise at [facility]   Social Drivers of Health   Financial Resource Strain: Low Risk  (12/12/2023)   Overall Financial Resource Strain (CARDIA)  Difficulty of Paying Living Expenses: Not hard at all  Food Insecurity: No Food Insecurity (12/12/2023)   Hunger Vital Sign    Worried About Running Out of Food in the Last Year: Never true    Ran Out of Food in the Last Year: Never true  Transportation Needs: No Transportation Needs (12/12/2023)   PRAPARE - Administrator, Civil Service (Medical): No    Lack of Transportation (Non-Medical): No  Physical Activity: Sufficiently Active (12/12/2023)   Exercise Vital Sign    Days of Exercise per Week: 5 days    Minutes of Exercise per Session: 60 min  Stress: No Stress Concern Present (12/12/2023)   [employer] of Occupational Health - Occupational Stress Questionnaire    Feeling of Stress : Not at all  Social Connections: Moderately Integrated (12/12/2023)   Social Connection and Isolation Panel [NHANES]    Frequency of Communication with Friends and Family: Three times a week    Frequency of Social Gatherings with Friends and Family: Three times a week    Attends Religious Services: More than 4 times per year    Active Member of Clubs or Organizations: No    Attends Banker Meetings: Never    Marital Status: Married    Tobacco Counseling Counseling given: Not Answered   Clinical Intake:  Pre-visit preparation completed: Yes  Pain : No/denies pain     BMI - recorded:  34 Nutritional Status: BMI > 30  Obese Diabetes: Yes CBG done?: Yes (260per pt) CBG resulted in Enter/ Edit results?: No Did pt. bring in CBG monitor from home?: No  How often do you need to have someone help you when you read instructions, pamphlets, or other written materials from your doctor or pharmacy?: 1 - Never  Interpreter Needed?: No  Information entered by :: Ellouise Haws, LPN   Activities of Daily Living    12/12/2023    8:12 AM  In your present state of health, do you have any difficulty performing the following activities:  Hearing? 0  Vision? 0  Difficulty concentrating or making decisions? 0  Walking or climbing stairs? 0  Dressing or bathing? 0  Doing errands, shopping? 0  Preparing Food and eating ? N  Using the Toilet? N  In the past six months, have you accidently leaked urine? N  Do you have problems with loss of bowel control? N  Managing your Medications? N  Managing your Finances? N  Housekeeping or managing your Housekeeping? N    Patient Care Team: Katrinka Garnette KIDD, MD as PCP - General (Family Medicine) Santo Stanly LABOR, MD as PCP - Cardiology (Internal Medicine) Roz Anes, MD as Consulting Physician (Ophthalmology) Dolphus Carrion, MD as Consulting Physician (Interventional Radiology) Waddell Danelle ORN, MD as Consulting Physician (Cardiology) Aura Mliss LABOR, RN as [facility] Care Management  Indicate any recent Medical Services you may have received from other than [facility] providers in the past year (date may be approximate).     Assessment:   This is a routine wellness examination for Paul Paul.  Hearing/Vision screen Hearing Screening - Comments:: Pt denies any hearing    Goals Addressed             This Visit's Progress    Patient Stated       Maintain health and activity        Depression Screen    12/12/2023    8:16 AM 02/22/2023   10:10 AM 11/09/2022  3:13 PM 11/06/2022    9:29 AM 10/31/2022    12:56 PM 10/24/2022   11:11 AM 10/24/2022   11:02 AM  PHQ 2/9 Scores  PHQ - 2 Score 0 0 0 0 0 0 0  PHQ- 9 Score  0         Fall Risk    02/22/2023   10:10 AM 11/09/2022    3:13 PM 11/06/2022    9:14 AM 10/31/2022   12:58 PM 10/24/2022   11:11 AM  Fall Risk   Falls in the past year? 0 0 0 0 0  Number falls in past yr: 0 0  0 0  Injury with Fall? 0 0  0 0  Risk for fall due to : History of fall(s) No Fall Risks   No Fall Risks  Follow up Falls evaluation completed Falls evaluation completed  Falls prevention discussed Falls evaluation completed    MEDICARE RISK AT HOME:    TIMED UP AND GO:  Was the test performed?  No    Cognitive Function:        Immunizations Immunization History  Administered Date(s) Administered   Fluad Quad(high Dose 65+) 10/02/2019, 09/28/2020, 01/12/2022, 09/15/2022   Fluad Trivalent(High Dose 65+) 10/02/2023   Influenza Split 11/06/2011, 09/05/2012   Influenza,inj,Quad PF,6+ Mos 09/24/2014, 09/18/2018   Janssen (J&J) SARS-COV-2 Vaccination 02/15/2020   Pneumococcal Conjugate-13 04/23/2018   Pneumococcal Polysaccharide-23 07/26/2012, 12/17/2020   Tdap 01/01/2012      Flu Vaccine status: Up to date  Pneumococcal vaccine status: Up to date  Covid-19 vaccine status: Information provided on how to obtain vaccines.   Qualifies for Shingles Vaccine? Yes   Zostavax completed No   Shingrix Completed?: No.    Education has been provided regarding the importance of this vaccine. Patient has been advised to call insurance company to determine out of pocket expense if they have not yet received this vaccine. Advised may also receive vaccine at local pharmacy or Health Dept. Verbalized acceptance and understanding.  Screening Tests Health Maintenance  Topic Date Due   Lung Cancer Screening  Never done   OPHTHALMOLOGY EXAM  12/28/2017   COVID-19 Vaccine (2 - Janssen risk series) 03/14/2020   Zoster Vaccines- Shingrix (1 of 2) 01/15/2024  (Originally 04/06/1975)   Diabetic kidney evaluation - Urine ACR  02/22/2024   FOOT EXAM  02/22/2024   HEMOGLOBIN A1C  04/13/2024   Diabetic kidney evaluation - eGFR measurement  10/14/2024   Medicare Annual Wellness (AWV)  12/11/2024   Pneumonia Vaccine 81+ Years old  Completed   INFLUENZA VACCINE  Completed   Hepatitis C Screening  Completed   HPV VACCINES  Aged Out   DTaP/Tdap/Td  Discontinued   Colonoscopy  Discontinued    Health Maintenance  Health Maintenance Due  Topic Date Due   Lung Cancer Screening  Never done   OPHTHALMOLOGY EXAM  12/28/2017   COVID-19 Vaccine (2 - Janssen risk series) 03/14/2020    Colorectal cancer screening: No longer required. Discontinued   Lung Cancer Screening: (Low Dose CT Chest recommended if Age 110-80 years, 20 pack-year currently smoking OR have quit w/in 15years.) does qualify.   Lung Cancer Screening Referral: declined at this time per pt   Additional Screening:  Hepatitis C Screening:  Completed 05/03/18  Vision Screening: Recommended annual ophthalmology exams for early detection of glaucoma and other disorders of the eye. Is the patient up to date with their annual eye exam?  No  Who is the provider or what  is the name of the office in which the patient attends annual eye exams? Pt needs to find a new provider  If pt is not established with a provider, would they like to be referred to a provider to establish care? Yes .   Dental Screening: Recommended annual dental exams for proper oral hygiene  Diabetic Foot Exam: Diabetic Foot Exam: Completed 02/22/23  Community Resource Referral / Chronic Care Management: CRR required this visit?  No   CCM required this visit?  No     Plan:     I have personally reviewed and noted the following in the patient's chart:   Medical and social history Use of alcohol, tobacco or illicit drugs  Current medications and supplements including opioid prescriptions. Patient is not currently  taking opioid prescriptions. Functional ability and status Nutritional status Physical activity Advanced directives List of other physicians Hospitalizations, surgeries, and ER visits in previous 12 months Vitals Screenings to include cognitive, depression, and falls Referrals and appointments  In addition, I have reviewed and discussed with patient certain preventive protocols, quality metrics, and best practice recommendations. A written personalized care plan for preventive services as well as general preventive health recommendations were provided to patient.     Ellouise VEAR Haws, LPN   07/04/7973   After Visit Summary: (MyChart) Due to this being a telephonic visit, the after visit summary with patients personalized plan was offered to patient via MyChart   Paul Notes: Pt declined video preference is telephonic

## 2023-12-12 NOTE — Progress Notes (Deleted)
 Subjective:   Paul Paul is a 68 y.o. male who presents for Medicare Annual/Subsequent preventive examination.  Visit Complete: Virtual I connected with  Paul Paul on 12/12/23 by a audio enabled telemedicine application and verified that I am speaking with the correct person using two identifiers.  Patient Location: Home  Provider Location: Home Office  I discussed the limitations of evaluation and management by telemedicine. The patient expressed understanding and agreed to proceed.  Vital Signs: Because this visit was a virtual/telehealth visit, some criteria may be missing or patient reported. Any vitals not documented were not able to be obtained and vitals that have been documented are patient reported.  Cardiac Risk Factors include: advanced age (>4men, >16 women);hypertension;diabetes mellitus;dyslipidemia;male gender;obesity (BMI >30kg/m2)     Objective:    Today's Vitals   12/12/23 0809  Weight: 272 lb (123.4 kg)   Body mass index is 34 kg/m.     12/12/2023    8:15 AM 05/09/2023    5:44 PM 04/30/2023    7:24 AM 11/06/2022    9:36 AM 10/31/2022   12:58 PM 02/23/2022    7:40 AM 10/24/2021    3:30 PM  Advanced Directives  Does Patient Have a Medical Advance Directive? Yes No No No No No No  Type of Estate Agent of [place];Living will        Copy of Healthcare Power of Attorney in Chart? No - copy requested        Would patient like information on creating a medical advance directive?    No - Patient declined No - Patient declined No - Patient declined No - Patient declined    Current Medications (verified) Outpatient Encounter Medications as of 12/12/2023  Medication Sig   albuterol  (VENTOLIN  HFA) 108 (90 Base) MCG/ACT inhaler Inhale 2 puffs into the lungs every 6 (six) hours as needed for wheezing or shortness of breath (for bronchitis).   aspirin  EC 81 MG tablet Take 81 mg by mouth daily.   atorvastatin  (LIPITOR ) 80 MG tablet TAKE 1  TABLET EVERY DAY   glimepiride  (AMARYL ) 4 MG tablet TAKE 2 TABLETS EVERY DAY BEFORE BREAKFAST   insulin  aspart protamine - aspart (NOVOLOG  70/30 FLEXPEN) (70-30) 100 UNIT/ML FlexPen Inject 30-40 Units into the skin 2 (two) times daily.   metoprolol  tartrate (LOPRESSOR ) 25 MG tablet TAKE 1 TABLET EVERY DAY   MM PEN NEEDLES 31G X 8 MM MISC USE TO CHECK BLOOD GLUCOSE TWICE DAILY BEFORE MEAL(S)   furosemide  (LASIX ) 20 MG tablet Take 1 tablet (20 mg total) by mouth daily as needed for fluid or edema. (Patient not taking: Reported on 12/12/2023)   nitroGLYCERIN  (NITROSTAT ) 0.4 MG SL tablet Place 1 tablet (0.4 mg total) under the tongue every 5 (five) minutes as needed for chest pain (up to 3 doses. call for help if doesnt resolve after 1 dose.). (Patient not taking: Reported on 12/12/2023)   No facility-administered encounter medications on file as of 12/12/2023.    Allergies (verified) Doxycycline , Lisinopril, and Losartan  potassium-hctz   History: Past Medical History:  Diagnosis Date   Anxiety    Arthritis    Bell palsy    > 30 years. Left side of face droops a little   Bradycardia    Cerebral aneurysm    Cerebral aneurysm without rupture 06/2011   Middle of Cerebral Artery   Complication of anesthesia 2012   after surgery for cliping of cerebral anerysum- In the room, patient was shaking, and patient  would awaken and then go back out shaking continued after he went back out:   Constipation    Diabetes mellitus    now under control   Dysrhythmia    BRADYCARDIA REQUIRING PACEMAKER .FOLLOWED BY GREGG TAYLOR   HYPERTENSION 12/10/2007   Pacemaker    Shoulder pain    L SHOULDER. RECENT FALL   Sleep apnea    CPAP.NOT WEARING NOW.NEEDS REPLACEMENT PIECE   Past Surgical History:  Procedure Laterality Date   CARDIAC CATHETERIZATION  07/06/11   normal   CEREBRAL ANEURYSM REPAIR  06/2011   stenting and coiling per patient   CORONARY STENT INTERVENTION N/A 07/21/2020   Procedure: CORONARY  STENT INTERVENTION;  Surgeon: Darron Deatrice LABOR, MD;  Location: [facility] INVASIVE CV LAB;  Service: Cardiovascular;  Laterality: N/A;   FRACTURE SURGERY Left    foot   INSERT / REPLACE / REMOVE PACEMAKER  2012   KNEE SURGERY Left    left; arthoscopic   LEFT HEART CATH AND CORONARY ANGIOGRAPHY N/A 07/21/2020   Procedure: LEFT HEART CATH AND CORONARY ANGIOGRAPHY;  Surgeon: Darron Deatrice LABOR, MD;  Location: [facility] INVASIVE CV LAB;  Service: Cardiovascular;  Laterality: N/A;   PPM GENERATOR CHANGEOUT N/A 02/23/2022   Procedure: PPM GENERATOR CHANGEOUT;  Surgeon: Waddell Danelle ORN, MD;  Location: [facility] INVASIVE CV LAB;  Service: Cardiovascular;  Laterality: N/A;   rotator cuff surgery Left 02/2012   septal deviation     SHOULDER ARTHROSCOPY WITH SUBACROMIAL DECOMPRESSION, ROTATOR CUFF REPAIR AND BICEP TENDON REPAIR Right 08/24/2015   Procedure: RIGHT SHOULDER DIAGNOSTIC OPERATIVE ARTHROSCOPY WITH DEBRIDEMENT, SUBACROMIAL DECOMPRESSION, MIMI-OPEN ROTATOR CUFF TEAR REPAIR AND POSSIBLE  BICEPS TENODESIS.  ;  Surgeon: Glendia Cordella Hutchinson, MD;  Location: [facility] OR;  Service: Orthopedics;  Laterality: Right;  RIGHT SHOULDER DIAGNOSTIC OPERATIVE ARTHROSCOPY, DEBRIDEMENT, SUBACROMIAL DECOMPRESSION, MINI-OPEN ROTATOR CUFF TEAR REPAIR, POSSIB   Family History  Problem Relation Age of Onset   Heart attack Mother        died 79, smoker   Heart disease Mother    Diabetes Mother    Heart attack Father        died 51, smoker   Heart disease Father    Social History   Socioeconomic History   Marital status: Married    Spouse name: Not on file   Number of children: Not on file   Years of education: Not on file   Highest education level: Not on file  Occupational History   Occupation: Disabled  Tobacco Use   Smoking status: Former    Current packs/day: 0.00    Average packs/day: 1 pack/day for 20.0 years (20.0 ttl pk-yrs)    Types: Cigarettes    Start date: 07/05/1991    Quit date: 07/05/2011    Years since quitting: 12.4    Smokeless tobacco: Never  Vaping Use   Vaping status: Never Used  Substance and Sexual Activity   Alcohol use: No    Alcohol/week: 0.0 standard drinks of alcohol   Drug use: No   Sexual activity: Not on file  Other Topics Concern   Not on file  Social History Narrative   Married over 30 years in 2015. 1 step son. 1 grandson.       Disabled from brain aneurysm, pacemaker. Retired from [employer] years.       Hobbies: grandson, exercise at [facility]   Social Drivers of Health   Financial Resource Strain: Low Risk  (12/12/2023)   Overall Financial Resource Strain (CARDIA)  Difficulty of Paying Living Expenses: Not hard at all  Food Insecurity: No Food Insecurity (12/12/2023)   Hunger Vital Sign    Worried About Running Out of Food in the Last Year: Never true    Ran Out of Food in the Last Year: Never true  Transportation Needs: No Transportation Needs (12/12/2023)   PRAPARE - Administrator, Civil Service (Medical): No    Lack of Transportation (Non-Medical): No  Physical Activity: Sufficiently Active (12/12/2023)   Exercise Vital Sign    Days of Exercise per Week: 5 days    Minutes of Exercise per Session: 60 min  Stress: No Stress Concern Present (12/12/2023)   [employer] of Occupational Health - Occupational Stress Questionnaire    Feeling of Stress : Not at all  Social Connections: Moderately Integrated (12/12/2023)   Social Connection and Isolation Panel [NHANES]    Frequency of Communication with Friends and Family: Three times a week    Frequency of Social Gatherings with Friends and Family: Three times a week    Attends Religious Services: More than 4 times per year    Active Member of Clubs or Organizations: No    Attends Banker Meetings: Never    Marital Status: Married    Tobacco Counseling Counseling given: Not Answered   Clinical Intake:  Pre-visit preparation completed: Yes  Pain : No/denies pain     BMI - recorded:  34 Nutritional Status: BMI > 30  Obese Diabetes: Yes CBG done?: Yes (260per pt) CBG resulted in Enter/ Edit results?: No Did pt. bring in CBG monitor from home?: No  How often do you need to have someone help you when you read instructions, pamphlets, or other written materials from your doctor or pharmacy?: 1 - Never  Interpreter Needed?: No  Information entered by :: Ellouise Haws, LPN   Activities of Daily Living    12/12/2023    8:12 AM  In your present state of health, do you have any difficulty performing the following activities:  Hearing? 0  Vision? 0  Difficulty concentrating or making decisions? 0  Walking or climbing stairs? 0  Dressing or bathing? 0  Doing errands, shopping? 0  Preparing Food and eating ? N  Using the Toilet? N  In the past six months, have you accidently leaked urine? N  Do you have problems with loss of bowel control? N  Managing your Medications? N  Managing your Finances? N  Housekeeping or managing your Housekeeping? N    Patient Care Team: Katrinka Garnette KIDD, MD as PCP - General (Family Medicine) Santo Stanly LABOR, MD as PCP - Cardiology (Internal Medicine) Roz Anes, MD as Consulting Physician (Ophthalmology) Dolphus Carrion, MD as Consulting Physician (Interventional Radiology) Waddell Danelle ORN, MD as Consulting Physician (Cardiology) Aura Mliss LABOR, RN as [facility] Care Management  Indicate any recent Medical Services you may have received from other than [facility] providers in the past year (date may be approximate).     Assessment:   This is a routine wellness examination for Paul Paul.  Hearing/Vision screen Hearing Screening - Comments:: Pt denies any hearing    Goals Addressed             This Visit's Progress    Patient Stated       Maintain health and activity        Depression Screen    12/12/2023    8:16 AM 02/22/2023   10:10 AM 11/09/2022  3:13 PM 11/06/2022    9:29 AM 10/31/2022    12:56 PM 10/24/2022   11:11 AM 10/24/2022   11:02 AM  PHQ 2/9 Scores  PHQ - 2 Score 0 0 0 0 0 0 0  PHQ- 9 Score  0         Fall Risk    02/22/2023   10:10 AM 11/09/2022    3:13 PM 11/06/2022    9:14 AM 10/31/2022   12:58 PM 10/24/2022   11:11 AM  Fall Risk   Falls in the past year? 0 0 0 0 0  Number falls in past yr: 0 0  0 0  Injury with Fall? 0 0  0 0  Risk for fall due to : History of fall(s) No Fall Risks   No Fall Risks  Follow up Falls evaluation completed Falls evaluation completed  Falls prevention discussed Falls evaluation completed    MEDICARE RISK AT HOME:    TIMED UP AND GO:  Was the test performed?  No    Cognitive Function:        12/12/2023    3:35 PM  6CIT Screen  What Year? 0 points  What month? 0 points  What time? 0 points  Count back from 20 0 points  Months in reverse 0 points  Repeat phrase 0 points  Total Score 0 points    Immunizations Immunization History  Administered Date(s) Administered   Fluad Quad(high Dose 65+) 10/02/2019, 09/28/2020, 01/12/2022, 09/15/2022   Fluad Trivalent(High Dose 65+) 10/02/2023   Influenza Split 11/06/2011, 09/05/2012   Influenza,inj,Quad PF,6+ Mos 09/24/2014, 09/18/2018   Janssen (J&J) SARS-COV-2 Vaccination 02/15/2020   Pneumococcal Conjugate-13 04/23/2018   Pneumococcal Polysaccharide-23 07/26/2012, 12/17/2020   Tdap 01/01/2012      Flu Vaccine status: Up to date  Pneumococcal vaccine status: Up to date  Covid-19 vaccine status: Information provided on how to obtain vaccines.   Qualifies for Shingles Vaccine? Yes   Zostavax completed No   Shingrix Completed?: No.    Education has been provided regarding the importance of this vaccine. Patient has been advised to call insurance company to determine out of pocket expense if they have not yet received this vaccine. Advised may also receive vaccine at local pharmacy or Health Dept. Verbalized acceptance and understanding.  Screening Tests Health  Maintenance  Topic Date Due   Lung Cancer Screening  Never done   OPHTHALMOLOGY EXAM  12/28/2017   COVID-19 Vaccine (2 - Janssen risk series) 03/14/2020   Zoster Vaccines- Shingrix (1 of 2) 01/15/2024 (Originally 04/06/1975)   Diabetic kidney evaluation - Urine ACR  02/22/2024   FOOT EXAM  02/22/2024   HEMOGLOBIN A1C  04/13/2024   Diabetic kidney evaluation - eGFR measurement  10/14/2024   Medicare Annual Wellness (AWV)  12/11/2024   Pneumonia Vaccine 61+ Years old  Completed   INFLUENZA VACCINE  Completed   Hepatitis C Screening  Completed   HPV VACCINES  Aged Out   DTaP/Tdap/Td  Discontinued   Colonoscopy  Discontinued    Health Maintenance  Health Maintenance Due  Topic Date Due   Lung Cancer Screening  Never done   OPHTHALMOLOGY EXAM  12/28/2017   COVID-19 Vaccine (2 - Janssen risk series) 03/14/2020    Colorectal cancer screening: No longer required. Discontinued   Lung Cancer Screening: (Low Dose CT Chest recommended if Age 61-80 years, 20 pack-year currently smoking OR have quit w/in 15years.) does qualify.   Lung Cancer Screening Referral: declined at this time per  pt   Additional Screening:  Hepatitis C Screening:  Completed 05/03/18  Vision Screening: Recommended annual ophthalmology exams for early detection of glaucoma and other disorders of the eye. Is the patient up to date with their annual eye exam?  No  Who is the provider or what is the name of the office in which the patient attends annual eye exams? Pt needs to find a new provider  If pt is not established with a provider, would they like to be referred to a provider to establish care? Yes .   Dental Screening: Recommended annual dental exams for proper oral hygiene  Diabetic Foot Exam: Diabetic Foot Exam: Completed 02/22/23  Community Resource Referral / Chronic Care Management: CRR required this visit?  No   CCM required this visit?  No     Plan:     I have personally reviewed and noted the  following in the patient's chart:   Medical and social history Use of alcohol, tobacco or illicit drugs  Current medications and supplements including opioid prescriptions. Patient is not currently taking opioid prescriptions. Functional ability and status Nutritional status Physical activity Advanced directives List of other physicians Hospitalizations, surgeries, and ER visits in previous 12 months Vitals Screenings to include cognitive, depression, and falls Referrals and appointments  In addition, I have reviewed and discussed with patient certain preventive protocols, quality metrics, and best practice recommendations. A written personalized care plan for preventive services as well as general preventive health recommendations were provided to patient.     Ellouise VEAR Haws, LPN   07/04/7973   After Visit Summary: (MyChart) Due to this being a telephonic visit, the after visit summary with patients personalized plan was offered to patient via MyChart   Nurse Notes: Pt declined video preference is telephonic

## 2023-12-12 NOTE — Patient Instructions (Signed)
 Paul Paul , Thank you for taking time to come for your Medicare Wellness Visit. I appreciate your ongoing commitment to your health goals. Please review the following plan we discussed and let me know if I can assist you in the future.   Referrals/Orders/Follow-Ups/Clinician Recommendations: maintain health and actvity   This is a list of the screening recommended for you and due dates:  Health Maintenance  Topic Date Due   Screening for Lung Cancer  Never done   Eye exam for diabetics  12/28/2017   COVID-19 Vaccine (2 - Janssen risk series) 03/14/2020   Zoster (Shingles) Vaccine (1 of 2) 01/15/2024*   Yearly kidney health urinalysis for diabetes  02/22/2024   Complete foot exam   02/22/2024   Hemoglobin A1C  04/13/2024   Yearly kidney function blood test for diabetes  10/14/2024   Medicare Annual Wellness Visit  12/11/2024   Pneumonia Vaccine  Completed   Flu Shot  Completed   Hepatitis C Screening  Completed   HPV Vaccine  Aged Out   DTaP/Tdap/Td vaccine  Discontinued   Colon Cancer Screening  Discontinued  *Topic was postponed. The date shown is not the original due date.    Advanced directives: (Copy Requested) Please bring a copy of your health care power of attorney and living will to the office to be added to your chart at your convenience.  Next Medicare Annual Wellness Visit scheduled for next year: Yes

## 2023-12-17 NOTE — Progress Notes (Signed)
 Subjective:   Paul Paul is a 68 y.o. male who presents for Medicare Annual/Subsequent preventive examination.  Visit Complete: Virtual I connected with  Paul Paul on 12/17/23 by a audio enabled telemedicine application and verified that I am speaking with the correct person using two identifiers.  Patient Location: Home  Provider Location: Home Office  I discussed the limitations of evaluation and management by telemedicine. The patient expressed understanding and agreed to proceed.  Vital Signs: Because this visit was a virtual/telehealth visit, some criteria may be missing or patient reported. Any vitals not documented were not able to be obtained and vitals that have been documented are patient reported.  Cardiac Risk Factors include: advanced age (>24men, >22 women);hypertension;diabetes mellitus;dyslipidemia;male gender;obesity (BMI >30kg/m2)     Objective:    Today's Vitals   12/12/23 0809  Weight: 272 lb (123.4 kg)   Body mass index is 34 kg/m.     12/12/2023    8:15 AM 05/09/2023    5:44 PM 04/30/2023    7:24 AM 11/06/2022    9:36 AM 10/31/2022   12:58 PM 02/23/2022    7:40 AM 10/24/2021    3:30 PM  Advanced Directives  Does Patient Have a Medical Advance Directive? Yes No No No No No No  Type of Estate Agent of [place];Living will        Copy of Healthcare Power of Attorney in Chart? No - copy requested        Would patient like information on creating a medical advance directive?    No - Patient declined No - Patient declined No - Patient declined No - Patient declined    Current Medications (verified) Outpatient Encounter Medications as of 12/12/2023  Medication Sig   albuterol  (VENTOLIN  HFA) 108 (90 Base) MCG/ACT inhaler Inhale 2 puffs into the lungs every 6 (six) hours as needed for wheezing or shortness of breath (for bronchitis).   aspirin  EC 81 MG tablet Take 81 mg by mouth daily.   atorvastatin  (LIPITOR ) 80 MG tablet TAKE 1  TABLET EVERY DAY   glimepiride  (AMARYL ) 4 MG tablet TAKE 2 TABLETS EVERY DAY BEFORE BREAKFAST   insulin  aspart protamine - aspart (NOVOLOG  70/30 FLEXPEN) (70-30) 100 UNIT/ML FlexPen Inject 30-40 Units into the skin 2 (two) times daily.   metoprolol  tartrate (LOPRESSOR ) 25 MG tablet TAKE 1 TABLET EVERY DAY   MM PEN NEEDLES 31G X 8 MM MISC USE TO CHECK BLOOD GLUCOSE TWICE DAILY BEFORE MEAL(S)   furosemide  (LASIX ) 20 MG tablet Take 1 tablet (20 mg total) by mouth daily as needed for fluid or edema. (Patient not taking: Reported on 12/12/2023)   nitroGLYCERIN  (NITROSTAT ) 0.4 MG SL tablet Place 1 tablet (0.4 mg total) under the tongue every 5 (five) minutes as needed for chest pain (up to 3 doses. call for help if doesnt resolve after 1 dose.). (Patient not taking: Reported on 12/12/2023)   No facility-administered encounter medications on file as of 12/12/2023.    Allergies (verified) Doxycycline , Lisinopril, and Losartan  potassium-hctz   History: Past Medical History:  Diagnosis Date   Anxiety    Arthritis    Bell palsy    > 30 years. Left side of face droops a little   Bradycardia    Cerebral aneurysm    Cerebral aneurysm without rupture 06/2011   Middle of Cerebral Artery   Complication of anesthesia 2012   after surgery for cliping of cerebral anerysum- In the room, patient was shaking, and patient  would awaken and then go back out shaking continued after he went back out:   Constipation    Diabetes mellitus    now under control   Dysrhythmia    BRADYCARDIA REQUIRING PACEMAKER .FOLLOWED BY GREGG TAYLOR   HYPERTENSION 12/10/2007   Pacemaker    Shoulder pain    L SHOULDER. RECENT FALL   Sleep apnea    CPAP.NOT WEARING NOW.NEEDS REPLACEMENT PIECE   Past Surgical History:  Procedure Laterality Date   CARDIAC CATHETERIZATION  07/06/11   normal   CEREBRAL ANEURYSM REPAIR  06/2011   stenting and coiling per patient   CORONARY STENT INTERVENTION N/A 07/21/2020   Procedure: CORONARY  STENT INTERVENTION;  Surgeon: Darron Deatrice LABOR, MD;  Location: [facility] INVASIVE CV LAB;  Service: Cardiovascular;  Laterality: N/A;   FRACTURE SURGERY Left    foot   INSERT / REPLACE / REMOVE PACEMAKER  2012   KNEE SURGERY Left    left; arthoscopic   LEFT HEART CATH AND CORONARY ANGIOGRAPHY N/A 07/21/2020   Procedure: LEFT HEART CATH AND CORONARY ANGIOGRAPHY;  Surgeon: Darron Deatrice LABOR, MD;  Location: [facility] INVASIVE CV LAB;  Service: Cardiovascular;  Laterality: N/A;   PPM GENERATOR CHANGEOUT N/A 02/23/2022   Procedure: PPM GENERATOR CHANGEOUT;  Surgeon: Waddell Danelle ORN, MD;  Location: [facility] INVASIVE CV LAB;  Service: Cardiovascular;  Laterality: N/A;   rotator cuff surgery Left 02/2012   septal deviation     SHOULDER ARTHROSCOPY WITH SUBACROMIAL DECOMPRESSION, ROTATOR CUFF REPAIR AND BICEP TENDON REPAIR Right 08/24/2015   Procedure: RIGHT SHOULDER DIAGNOSTIC OPERATIVE ARTHROSCOPY WITH DEBRIDEMENT, SUBACROMIAL DECOMPRESSION, MIMI-OPEN ROTATOR CUFF TEAR REPAIR AND POSSIBLE  BICEPS TENODESIS.  ;  Surgeon: Glendia Cordella Hutchinson, MD;  Location: [facility] OR;  Service: Orthopedics;  Laterality: Right;  RIGHT SHOULDER DIAGNOSTIC OPERATIVE ARTHROSCOPY, DEBRIDEMENT, SUBACROMIAL DECOMPRESSION, MINI-OPEN ROTATOR CUFF TEAR REPAIR, POSSIB   Family History  Problem Relation Age of Onset   Heart attack Mother        died 81, smoker   Heart disease Mother    Diabetes Mother    Heart attack Father        died 49, smoker   Heart disease Father    Social History   Socioeconomic History   Marital status: Married    Spouse name: Not on file   Number of children: Not on file   Years of education: Not on file   Highest education level: Not on file  Occupational History   Occupation: Disabled  Tobacco Use   Smoking status: Former    Current packs/day: 0.00    Average packs/day: 1 pack/day for 20.0 years (20.0 ttl pk-yrs)    Types: Cigarettes    Start date: 07/05/1991    Quit date: 07/05/2011    Years since quitting: 12.4    Smokeless tobacco: Never  Vaping Use   Vaping status: Never Used  Substance and Sexual Activity   Alcohol use: No    Alcohol/week: 0.0 standard drinks of alcohol   Drug use: No   Sexual activity: Not on file  Other Topics Concern   Not on file  Social History Narrative   Married over 30 years in 2015. 1 step son. 1 grandson.       Disabled from brain aneurysm, pacemaker. Retired from [employer] years.       Hobbies: grandson, exercise at [facility]   Social Drivers of Health   Financial Resource Strain: Low Risk  (12/12/2023)   Overall Financial Resource Strain (CARDIA)  Difficulty of Paying Living Expenses: Not hard at all  Food Insecurity: No Food Insecurity (12/12/2023)   Hunger Vital Sign    Worried About Running Out of Food in the Last Year: Never true    Ran Out of Food in the Last Year: Never true  Transportation Needs: No Transportation Needs (12/12/2023)   PRAPARE - Administrator, Civil Service (Medical): No    Lack of Transportation (Non-Medical): No  Physical Activity: Sufficiently Active (12/12/2023)   Exercise Vital Sign    Days of Exercise per Week: 5 days    Minutes of Exercise per Session: 60 min  Stress: No Stress Concern Present (12/12/2023)   [employer] of Occupational Health - Occupational Stress Questionnaire    Feeling of Stress : Not at all  Social Connections: Moderately Integrated (12/12/2023)   Social Connection and Isolation Panel [NHANES]    Frequency of Communication with Friends and Family: Three times a week    Frequency of Social Gatherings with Friends and Family: Three times a week    Attends Religious Services: More than 4 times per year    Active Member of Clubs or Organizations: No    Attends Banker Meetings: Never    Marital Status: Married    Tobacco Counseling Counseling given: Not Answered   Clinical Intake:  Pre-visit preparation completed: Yes  Pain : No/denies pain     BMI - recorded:  34 Nutritional Status: BMI > 30  Obese Diabetes: Yes CBG done?: Yes (260per pt) CBG resulted in Enter/ Edit results?: No Did pt. bring in CBG monitor from home?: No  How often do you need to have someone help you when you read instructions, pamphlets, or other written materials from your doctor or pharmacy?: 1 - Never  Interpreter Needed?: No  Information entered by :: Ellouise Haws, LPN   Activities of Daily Living    12/12/2023    8:12 AM  In your present state of health, do you have any difficulty performing the following activities:  Hearing? 0  Vision? 0  Difficulty concentrating or making decisions? 0  Walking or climbing stairs? 0  Dressing or bathing? 0  Doing errands, shopping? 0  Preparing Food and eating ? N  Using the Toilet? N  In the past six months, have you accidently leaked urine? N  Do you have problems with loss of bowel control? N  Managing your Medications? N  Managing your Finances? N  Housekeeping or managing your Housekeeping? N    Patient Care Team: Katrinka Garnette KIDD, MD as PCP - General (Family Medicine) Santo Stanly LABOR, MD as PCP - Cardiology (Internal Medicine) Roz Anes, MD as Consulting Physician (Ophthalmology) Dolphus Carrion, MD as Consulting Physician (Interventional Radiology) Waddell Danelle ORN, MD as Consulting Physician (Cardiology) Aura Mliss LABOR, RN as [facility] Care Management  Indicate any recent Medical Services you may have received from other than [facility] providers in the past year (date may be approximate).     Assessment:   This is a routine wellness examination for [place].  Hearing/Vision screen Hearing Screening - Comments:: Pt denies any hearing  Vision Screening - Comments:: Pt will need to follow up with eye provider    Goals Addressed             This Visit's Progress    Patient Stated       Maintain health and activity        Depression Screen    12/12/2023  8:16 AM  02/22/2023   10:10 AM 11/09/2022    3:13 PM 11/06/2022    9:29 AM 10/31/2022   12:56 PM 10/24/2022   11:11 AM 10/24/2022   11:02 AM  PHQ 2/9 Scores  PHQ - 2 Score 0 0 0 0 0 0 0  PHQ- 9 Score  0         Fall Risk    12/12/2023    7:53 AM 02/22/2023   10:10 AM 11/09/2022    3:13 PM 11/06/2022    9:14 AM 10/31/2022   12:58 PM  Fall Risk   Falls in the past year? 0 0 0 0 0  Number falls in past yr: 0 0 0  0  Injury with Fall? 0 0 0  0  Risk for fall due to : No Fall Risks History of fall(s) No Fall Risks    Follow up Falls prevention discussed Falls evaluation completed Falls evaluation completed  Falls prevention discussed    MEDICARE RISK AT HOME: Medicare Risk at Home Any stairs in or around the home?: Yes If so, are there any without handrails?: No Home free of loose throw rugs in walkways, pet beds, electrical cords, etc?: Yes Adequate lighting in your home to reduce risk of falls?: Yes Life alert?: No Use of a cane, walker or w/c?: No Grab bars in the bathroom?: No Shower chair or bench in shower?: No Elevated toilet seat or a handicapped toilet?: No  TIMED UP AND GO:  Was the test performed?  No    Cognitive Function:        12/12/2023    3:35 PM  6CIT Screen  What Year? 0 points  What month? 0 points  What time? 0 points  Count back from 20 0 points  Months in reverse 0 points  Repeat phrase 0 points  Total Score 0 points    Immunizations Immunization History  Administered Date(s) Administered   Fluad Quad(high Dose 65+) 10/02/2019, 09/28/2020, 01/12/2022, 09/15/2022   Fluad Trivalent(High Dose 65+) 10/02/2023   Influenza Split 11/06/2011, 09/05/2012   Influenza,inj,Quad PF,6+ Mos 09/24/2014, 09/18/2018   Janssen (J&J) SARS-COV-2 Vaccination 02/15/2020   Pneumococcal Conjugate-13 04/23/2018   Pneumococcal Polysaccharide-23 07/26/2012, 12/17/2020   Tdap 01/01/2012      Flu Vaccine status: Up to date  Pneumococcal vaccine status: Up to  date  Covid-19 vaccine status: Information provided on how to obtain vaccines.   Qualifies for Shingles Vaccine? Yes   Zostavax completed No   Shingrix Completed?: No.    Education has been provided regarding the importance of this vaccine. Patient has been advised to call insurance company to determine out of pocket expense if they have not yet received this vaccine. Advised may also receive vaccine at local pharmacy or Health Dept. Verbalized acceptance and understanding.  Screening Tests Health Maintenance  Topic Date Due   Lung Cancer Screening  Never done   OPHTHALMOLOGY EXAM  12/28/2017   COVID-19 Vaccine (2 - Janssen risk series) 03/14/2020   Zoster Vaccines- Shingrix (1 of 2) 01/15/2024 (Originally 04/06/1975)   Diabetic kidney evaluation - Urine ACR  02/22/2024   FOOT EXAM  02/22/2024   HEMOGLOBIN A1C  04/13/2024   Diabetic kidney evaluation - eGFR measurement  10/14/2024   Medicare Annual Wellness (AWV)  12/11/2024   Pneumonia Vaccine 73+ Years old  Completed   INFLUENZA VACCINE  Completed   Hepatitis C Screening  Completed   HPV VACCINES  Aged Out   DTaP/Tdap/Td  Discontinued  Colonoscopy  Discontinued    Health Maintenance  Health Maintenance Due  Topic Date Due   Lung Cancer Screening  Never done   OPHTHALMOLOGY EXAM  12/28/2017   COVID-19 Vaccine (2 - Janssen risk series) 03/14/2020    Colorectal cancer screening: No longer required. Discontinued   Lung Cancer Screening: (Low Dose CT Chest recommended if Age 64-80 years, 20 pack-year currently smoking OR have quit w/in 15years.) does qualify.   Lung Cancer Screening Referral: declined at this time per pt   Additional Screening:  Hepatitis C Screening:  Completed 05/03/18  Vision Screening: Recommended annual ophthalmology exams for early detection of glaucoma and other disorders of the eye. Is the patient up to date with their annual eye exam?  No  Who is the provider or what is the name of the office in  which the patient attends annual eye exams? Pt needs to find a new provider  If pt is not established with a provider, would they like to be referred to a provider to establish care? Yes .   Dental Screening: Recommended annual dental exams for proper oral hygiene  Diabetic Foot Exam: Diabetic Foot Exam: Completed 02/22/23  Community Resource Referral / Chronic Care Management: CRR required this visit?  No   CCM required this visit?  No     Plan:     I have personally reviewed and noted the following in the patient's chart:   Medical and social history Use of alcohol, tobacco or illicit drugs  Current medications and supplements including opioid prescriptions. Patient is not currently taking opioid prescriptions. Functional ability and status Nutritional status Physical activity Advanced directives List of other physicians Hospitalizations, surgeries, and ER visits in previous 12 months Vitals Screenings to include cognitive, depression, and falls Referrals and appointments  In addition, I have reviewed and discussed with patient certain preventive protocols, quality metrics, and best practice recommendations. A written personalized care plan for preventive services as well as general preventive health recommendations were provided to patient.     Ellouise VEAR Haws, LPN   8/[id number]/7974   After Visit Summary: (MyChart) Due to this being a telephonic visit, the after visit summary with patients personalized plan was offered to patient via MyChart   Nurse Notes: Pt declined video preference is telephonic

## 2024-01-24 ENCOUNTER — Other Ambulatory Visit: Payer: Self-pay | Admitting: Family Medicine

## 2024-02-25 ENCOUNTER — Other Ambulatory Visit: Payer: Self-pay | Admitting: Family Medicine

## 2024-02-26 ENCOUNTER — Encounter: Payer: Self-pay | Admitting: Family Medicine

## 2024-02-26 ENCOUNTER — Ambulatory Visit: Admitting: Family Medicine

## 2024-02-26 VITALS — BP 112/60 | HR 73 | Temp 97.2°F | Ht 75.0 in | Wt 266.2 lb

## 2024-02-26 DIAGNOSIS — Z794 Long term (current) use of insulin: Secondary | ICD-10-CM | POA: Diagnosis not present

## 2024-02-26 DIAGNOSIS — I2510 Atherosclerotic heart disease of native coronary artery without angina pectoris: Secondary | ICD-10-CM

## 2024-02-26 DIAGNOSIS — H6123 Impacted cerumen, bilateral: Secondary | ICD-10-CM

## 2024-02-26 DIAGNOSIS — Z7984 Long term (current) use of oral hypoglycemic drugs: Secondary | ICD-10-CM

## 2024-02-26 DIAGNOSIS — I10 Essential (primary) hypertension: Secondary | ICD-10-CM | POA: Diagnosis not present

## 2024-02-26 DIAGNOSIS — E1165 Type 2 diabetes mellitus with hyperglycemia: Secondary | ICD-10-CM

## 2024-02-26 DIAGNOSIS — Z125 Encounter for screening for malignant neoplasm of prostate: Secondary | ICD-10-CM | POA: Diagnosis not present

## 2024-02-26 NOTE — Patient Instructions (Addendum)
 We have placed a referral for you today to ENT- please call their # if you do not hear within a week (may be listed below or you may see mychart message within a few days with #).   Please stop by lab before you go If you have mychart- we will send your results within 3 business days of [place] receiving them.  If you do not have mychart- we will call you about results within 5 business days of [place] receiving them.  *please also note that you will see labs on mychart as soon as they post. I will later go in and write notes on them- will say "notes from Dr. Durene Cal"   Recommended follow up: Return in about 14 weeks (around 06/03/2024) for followup or sooner if needed.Schedule b4 you leave.

## 2024-02-26 NOTE — Progress Notes (Signed)
 Phone [phone] In person visit   Subjective:   Paul Paul is a 68 y.o. year old very pleasant male patient who presents for/with See problem oriented charting Chief Complaint  Patient presents with   bilateral ear fullness    Pt c/o bilateral ear fullness and would like ENT referral    Past Medical History-  Patient Active Problem List   Diagnosis Date Noted   CAD (coronary artery disease) status post drug-eluting stent mid LAD August 2021 07/28/2020    Priority: High   Sinus node dysfunction (HCC) 08/05/2019    Priority: High   Cerebral aneurysm without rupture 01/01/2012    Priority: High   Pacemaker 10/07/2011    Priority: High   Poorly controlled type 2 diabetes mellitus (HCC) 11/09/2008    Priority: High   Aortic atherosclerosis (HCC) 07/28/2020    Priority: Medium    Fatty liver 06/18/2018    Priority: Medium    Hepatotoxicity due to statin drug 10/19/2017    Priority: Medium    Elevated LFTs 08/09/2017    Priority: Medium    GAD (generalized anxiety disorder) 10/11/2016    Priority: Medium    OSA on CPAP 09/24/2014    Priority: Medium    Hyperlipidemia associated with type 2 diabetes mellitus (HCC) 09/24/2014    Priority: Medium    Other specified cardiac dysrhythmias(427.89) 07/09/2012    Priority: Medium    Essential hypertension 12/10/2007    Priority: Medium    Obesity 01/01/2014    Priority: Low   Chronic pruritic rash in adult 08/09/2017    Medications- reviewed and updated Current Outpatient Medications  Medication Sig Dispense Refill   albuterol (VENTOLIN HFA) 108 (90 Base) MCG/ACT inhaler Inhale 2 puffs into the lungs every 6 (six) hours as needed for wheezing or shortness of breath (for bronchitis). 1 each 0   aspirin EC 81 MG tablet Take 81 mg by mouth daily.     atorvastatin (LIPITOR) 80 MG tablet TAKE 1 TABLET EVERY DAY 90 tablet 3   furosemide (LASIX) 20 MG tablet Take 1 tablet (20 mg total) by mouth daily as needed for fluid or  edema. 7 tablet 0   glimepiride (AMARYL) 4 MG tablet TAKE 2 TABLETS EVERY DAY BEFORE BREAKFAST 180 tablet 3   insulin aspart protamine - aspart (NOVOLOG 70/30 FLEXPEN) (70-30) 100 UNIT/ML FlexPen Inject 30-40 Units into the skin 2 (two) times daily. 21 mL 11   metoprolol tartrate (LOPRESSOR) 25 MG tablet TAKE 1 TABLET EVERY DAY 90 tablet 3   MM PEN NEEDLES 31G X 8 MM MISC USE TO CHECK BLOOD GLUCOSE TWICE DAILY BEFORE A MEAL 100 each 0   nitroGLYCERIN (NITROSTAT) 0.4 MG SL tablet Place 1 tablet (0.4 mg total) under the tongue every 5 (five) minutes as needed for chest pain (up to 3 doses. call for help if doesnt resolve after 1 dose.). 30 tablet 3   No current facility-administered medications for this visit.     Objective:  BP 112/60   Pulse 73   Temp (!) 97.2 F (36.2 C)   Ht 6\' 3"  (1.905 m)   Wt 266 lb 3.2 oz (120.7 kg)   SpO2 95%   BMI 33.27 kg/m  Gen: NAD, resting comfortably CV: RRR no murmurs rubs or gallops Lungs: CTAB no crackles, wheeze, rhonchi Abdomen: soft/nontender/nondistended/normal bowel sounds. No rebound or guarding.  Ext: no edema Skin: warm, dry     Assessment and Plan   # Bilateral ear fullness  S:  Patient has noted bilateral ear fullness-he would like to establish with ear nose and throat but requires referral per his report A/P: Bilateral cerumen impaction-offered irrigation but he declines-he prefers to establish with ENT-has been seen in the past and simply needs new referral-referral was placed today and he prefers to be within [facility] network   #OSA- off his CPAP- advised to restart-has noted more daytime sleepiness and also wonder if this could stress his cerebral aneurysm 38- 36 units  Lab Results  Component Value Date   HGBA1C 11.6 (H) 10/15/2023   HGBA1C 11.9 (H) 02/22/2023   HGBA1C 10.9 (A) 09/15/2022     #Diabetes mellitus-poor control. Metformin intolerant- GI. S: compliant with 8mg  glimepiride daily, NovoLog 70/30 with 38 units in am and  30 units in pm  - Large swings in sugars in past as far as control with improved diet/exercise.  Unfortunately he has been consuming Pepsi per wife and advised against this -[place] was too costly in 2023 but was helpful Lab Results  Component Value Date   HGBA1C 11.6 (H) 10/15/2023   HGBA1C 11.9 (H) 02/22/2023   HGBA1C 10.9 (A) 09/15/2022  A/P: A1c has been far too elevated/poor control diabetes-update A1c today-expense has been a major concern so we have been rather limited-likely recommend endocrinology referral after repeat labs back   #Hypertension S: Controlled on metoprolol 25 mg twice daily.  He has not needed Lasix BP Readings from Last 3 Encounters:  02/26/24 112/60  11/19/23 138/70  11/05/23 130/72  A/P:  well controlled continue current medications   #CAD  #Hyperlipidemia/elevated LFTs S: Medication: Atorvastatin 80 mg, aspirin 81 mg.  Has had LFT elevations due to statins in the past but has tolerated atorvastatin lately -No chest pain or shortness of breath reported today A/P: CAD appears asymptomatic-continue current medication.  Update lipid panel with hope for LDL under 70   #pacemaker due to history symptomatic bradycardia S: Patient follows with Dr. Ladona Ridgel.  He has a pacemaker due to history of symptomatic bradycardia A/P: Patient doing reasonably well with pacemaker-continue cardiology follow-up  Recommended follow up: Return in about 14 weeks (around 06/03/2024) for followup or sooner if needed.Schedule b4 you leave. Future Appointments  Date Time Provider Department Center  12/16/2024  8:00 AM [facility] ANNUAL WELLNESS VISIT 1 [facility]    Lab/Order associations: breakfast and later protein shake   ICD-10-CM   1. Bilateral impacted cerumen  H61.23 Ambulatory referral to ENT    CANCELED: Ambulatory referral to ENT    2. Poorly controlled type 2 diabetes mellitus (HCC)  E11.65 Comprehensive metabolic panel    CBC with Differential/Platelet    Lipid panel     Hemoglobin A1c    3. Essential hypertension  I10 Comprehensive metabolic panel    CBC with Differential/Platelet    Lipid panel    4. Coronary artery disease involving native coronary artery of native heart without angina pectoris  I25.10     5. Screening for prostate cancer  Z12.5 PSA, Medicare     No orders of the defined types were placed in this encounter.  Return precautions advised.  Tana Conch, MD

## 2024-02-27 LAB — COMPREHENSIVE METABOLIC PANEL WITH GFR
ALT: 58 U/L — ABNORMAL HIGH (ref 0–53)
AST: 54 U/L — ABNORMAL HIGH (ref 0–37)
Albumin: 3.7 g/dL (ref 3.5–5.2)
Alkaline Phosphatase: 90 U/L (ref 39–117)
BUN: 19 mg/dL (ref 6–23)
CO2: 24 meq/L (ref 19–32)
Calcium: 9.7 mg/dL (ref 8.4–10.5)
Chloride: 100 meq/L (ref 96–112)
Creatinine, Ser: 0.83 mg/dL (ref 0.40–1.50)
GFR: 89.69 mL/min (ref >60.00)
Glucose, Bld: 303 mg/dL — ABNORMAL HIGH (ref 70–99)
Potassium: 4.1 meq/L (ref 3.5–5.1)
Sodium: 133 meq/L — ABNORMAL LOW (ref 135–145)
Total Bilirubin: 1 mg/dL (ref 0.2–1.2)
Total Protein: 7.3 g/dL (ref 6.0–8.3)

## 2024-02-27 LAB — CBC WITH DIFFERENTIAL/PLATELET
Basophils Absolute: 0 10*3/uL (ref 0.0–0.1)
Basophils Relative: 0.9 % (ref 0.0–3.0)
Eosinophils Absolute: 0.1 10*3/uL (ref 0.0–0.7)
Eosinophils Relative: 2.8 % (ref 0.0–5.0)
HCT: 44.6 % (ref 39.0–52.0)
Hemoglobin: 15.3 g/dL (ref 13.0–17.0)
Lymphocytes Relative: 34.1 % (ref 12.0–46.0)
Lymphs Abs: 1.8 10*3/uL (ref 0.7–4.0)
MCHC: 34.2 g/dL (ref 30.0–36.0)
MCV: 91.4 fl (ref 78.0–100.0)
Monocytes Absolute: 0.4 10*3/uL (ref 0.1–1.0)
Monocytes Relative: 7.4 % (ref 3.0–12.0)
Neutro Abs: 2.8 10*3/uL (ref 1.4–7.7)
Neutrophils Relative %: 54.8 % (ref 43.0–77.0)
Platelets: 160 10*3/uL (ref 150.0–400.0)
RBC: 4.88 Mil/uL (ref 4.22–5.81)
RDW: 13.4 % (ref 11.5–15.5)
WBC: 5.2 10*3/uL (ref 4.0–10.5)

## 2024-02-27 LAB — LIPID PANEL
Cholesterol: 123 mg/dL (ref 0–200)
HDL: 39.9 mg/dL (ref >39.00)
LDL Cholesterol: 54 mg/dL (ref 0–99)
NonHDL: 83.51
Total CHOL/HDL Ratio: 3
Triglycerides: 150 mg/dL — ABNORMAL HIGH (ref 0.0–149.0)
VLDL: 30 mg/dL (ref 0.0–40.0)

## 2024-02-27 LAB — HEMOGLOBIN A1C: Hgb A1c MFr Bld: 12 % — ABNORMAL HIGH (ref 4.6–6.5)

## 2024-02-28 LAB — PSA, MEDICARE: PSA: 0.9 ng/mL (ref 0.10–4.00)

## 2024-02-29 ENCOUNTER — Encounter: Payer: Self-pay | Admitting: Family Medicine

## 2024-02-29 ENCOUNTER — Other Ambulatory Visit: Payer: Self-pay

## 2024-02-29 DIAGNOSIS — E1165 Type 2 diabetes mellitus with hyperglycemia: Secondary | ICD-10-CM

## 2024-03-12 ENCOUNTER — Institutional Professional Consult (permissible substitution) (INDEPENDENT_AMBULATORY_CARE_PROVIDER_SITE_OTHER)

## 2024-03-13 ENCOUNTER — Encounter (INDEPENDENT_AMBULATORY_CARE_PROVIDER_SITE_OTHER): Payer: Self-pay

## 2024-03-13 ENCOUNTER — Ambulatory Visit (INDEPENDENT_AMBULATORY_CARE_PROVIDER_SITE_OTHER): Admitting: Physician Assistant

## 2024-03-13 VITALS — BP 160/89 | HR 70 | Ht 75.0 in | Wt 265.0 lb

## 2024-03-13 DIAGNOSIS — H6123 Impacted cerumen, bilateral: Secondary | ICD-10-CM | POA: Diagnosis not present

## 2024-03-13 NOTE — Progress Notes (Signed)
 Dear Dr. Arlene Ben, Here is my assessment for our mutual patient, Paul Paul. Thank you for allowing me the opportunity to care for your patient. Please do not hesitate to contact me should you have any other questions. Sincerely, Belma Boxer PA-C  Otolaryngology Clinic Note Referring provider: Dr. Arlene Ben HPI:  Paul Paul is a 68 y.o. male kindly referred by Dr. Arlene Ben   The patient is a 68 year old gentleman seen in our office for evaluation of cerumen impaction.  The patient notes a history of several weeks of bilateral fullness in his ears, he denies any pain, no drainage.  Prior to the fullness he denied any significant ear history, no history of recurrent ear infections, no significant hearing deficits.  He was seen by his primary care provider who offered irrigation, he declined and elected to proceed with ENT evaluation.   Independent Review of Additional Tests or Records:  PCP office visit note 02/26/2024   PMH/Meds/All/SocHx/FamHx/ROS:   Past Medical History:  Diagnosis Date   Anxiety    Arthritis    Bell palsy    > 30 years. Left side of face droops a little   Bradycardia    Cerebral aneurysm    Cerebral aneurysm without rupture 06/2011   Middle of Cerebral Artery   Complication of anesthesia 2012   after surgery for cliping of cerebral anerysum- In the room, patient was shaking, and patient would awaken and then "go back out" shaking continued after he "went back out:   Constipation    Diabetes mellitus    now under control   Dysrhythmia    BRADYCARDIA REQUIRING PACEMAKER .FOLLOWED BY GREGG TAYLOR   HYPERTENSION 12/10/2007   Pacemaker    Shoulder pain    L SHOULDER. RECENT FALL   Sleep apnea    CPAP.NOT WEARING NOW.NEEDS REPLACEMENT PIECE     Past Surgical History:  Procedure Laterality Date   CARDIAC CATHETERIZATION  07/06/11   normal   CEREBRAL ANEURYSM REPAIR  06/2011   "stenting and coiling" per patient   CORONARY STENT INTERVENTION N/A 07/21/2020   Procedure:  CORONARY STENT INTERVENTION;  Surgeon: Wenona Hamilton, MD;  [facility]: [facility];  Service: Cardiovascular;  Laterality: N/A;   FRACTURE SURGERY Left    foot   INSERT / REPLACE / REMOVE PACEMAKER  2012   KNEE SURGERY Left    left; arthoscopic   LEFT HEART CATH AND CORONARY ANGIOGRAPHY N/A 07/21/2020   Procedure: LEFT HEART CATH AND CORONARY ANGIOGRAPHY;  Surgeon: Wenona Hamilton, MD;  Location: [facility];  Service: Cardiovascular;  Laterality: N/A;   PPM GENERATOR CHANGEOUT N/A 02/23/2022   Procedure: PPM GENERATOR CHANGEOUT;  Surgeon: Tammie Fall, MD;  Location: [facility] INVASIVE CV LAB;  Service: Cardiovascular;  Laterality: N/A;   rotator cuff surgery Left 02/2012   septal deviation     SHOULDER ARTHROSCOPY WITH SUBACROMIAL DECOMPRESSION, ROTATOR CUFF REPAIR AND BICEP TENDON REPAIR Right 08/24/2015   Procedure: RIGHT SHOULDER DIAGNOSTIC OPERATIVE ARTHROSCOPY WITH DEBRIDEMENT, SUBACROMIAL DECOMPRESSION, MIMI-OPEN ROTATOR CUFF TEAR REPAIR AND POSSIBLE  BICEPS TENODESIS.  ;  Surgeon: Jasmine Mesi, MD;  Location: [facility];  Service: Orthopedics;  Laterality: Right;  RIGHT SHOULDER DIAGNOSTIC OPERATIVE ARTHROSCOPY, DEBRIDEMENT, SUBACROMIAL DECOMPRESSION, MINI-OPEN ROTATOR CUFF TEAR REPAIR, POSSIB    Family History  Problem Relation Age of Onset   Heart attack Mother        died 33, smoker   Heart disease Mother    Diabetes Mother    Heart attack  Father        died 35, smoker   Heart disease Father      Social Connections: Moderately Integrated (12/12/2023)   Social Connection and Isolation Panel [NHANES]    Frequency of Communication with Friends and Family: Three times a week    Frequency of Social Gatherings with Friends and Family: Three times a week    Attends Religious Services: More than 4 times per year    Active Member of Clubs or Organizations: No    Attends Banker Meetings: Never    Marital Status: Married      Current Outpatient  Medications:    aspirin EC 81 MG tablet, Take 81 mg by mouth daily., Disp: , Rfl:    atorvastatin (LIPITOR) 80 MG tablet, TAKE 1 TABLET EVERY DAY, Disp: 90 tablet, Rfl: 3   glimepiride (AMARYL) 4 MG tablet, TAKE 2 TABLETS EVERY DAY BEFORE BREAKFAST, Disp: 180 tablet, Rfl: 3   insulin aspart protamine - aspart (NOVOLOG 70/30 FLEXPEN) (70-30) 100 UNIT/ML FlexPen, Inject 30-40 Units into the skin 2 (two) times daily., Disp: 21 mL, Rfl: 11   metoprolol tartrate (LOPRESSOR) 25 MG tablet, TAKE 1 TABLET EVERY DAY, Disp: 90 tablet, Rfl: 3   MM PEN NEEDLES 31G X 8 MM MISC, USE TO CHECK BLOOD GLUCOSE TWICE DAILY BEFORE A MEAL, Disp: 100 each, Rfl: 0   nitroGLYCERIN (NITROSTAT) 0.4 MG SL tablet, Place 1 tablet (0.4 mg total) under the tongue every 5 (five) minutes as needed for chest pain (up to 3 doses. call for help if doesnt resolve after 1 dose.)., Disp: 30 tablet, Rfl: 3   albuterol (VENTOLIN HFA) 108 (90 Base) MCG/ACT inhaler, Inhale 2 puffs into the lungs every 6 (six) hours as needed for wheezing or shortness of breath (for bronchitis). (Patient not taking: Reported on 03/13/2024), Disp: 1 each, Rfl: 0   furosemide (LASIX) 20 MG tablet, Take 1 tablet (20 mg total) by mouth daily as needed for fluid or edema. (Patient not taking: Reported on 03/13/2024), Disp: 7 tablet, Rfl: 0   Physical Exam:   BP (!) 160/89 (BP Location: Left Wrist, Patient Position: Sitting, Cuff Size: Normal)   Pulse 70   Ht 6\' 3"  (1.905 m)   Wt 265 lb (120.2 kg)   SpO2 92%   BMI 33.12 kg/m   Pertinent Findings  CN II-XII intact Bilateral cerumen impaction Weber 512: equal Rinne 512: AC > BC b/l  Anterior rhinoscopy: Septum midline; bilateral inferior turbinates with no hypertrophy No lesions of oral cavity/oropharynx;  No obviously neck masses/lymphadenopathy/thyromegaly No respiratory distress or stridor  Seprately Identifiable Procedures:  Procedure: Bilateral ear microscopy and cerumen removal using microscope (CPT  [phone]) - Mod 50 Pre-procedure diagnosis: bilateral cerumen impaction external auditory canals Post-procedure diagnosis: same Indication: bilateral cerumen impaction; given patient's otologic complaints and history as well as for improved and comprehensive examination of external ear and tympanic membrane, bilateral otologic examination using microscope was performed and impacted cerumen removed  Procedure: Patient was placed semi-recumbent. Both ear canals were examined using the microscope with findings above. Cerumen removed from bilateral external auditory canals using suction and currette with improvement in EAC examination and patency. Left: EAC was patent. TM was intact . Middle ear was aerated. Drainage: none Right: EAC was patent. TM was intact . Middle ear was aerated . Drainage: none Patient tolerated the procedure well.   Impression & Plans:  Sayre Witherington is a 68 y.o. male with the following   Cerumen impaction-  The patient presented today with cerumen impaction.  This was removed without difficulty.  I see no signs of infection.  The patient will reach out to the office if she develops any new or worsening signs or symptoms.  She does not feel she has any significant change to her baseline hearing and did not elect for audiology evaluation today.    - f/u PRN   Thank you for allowing me the opportunity to care for your patient. Please do not hesitate to contact me should you have any other questions.  Sincerely, Belma Boxer PA-C [facility] Phone: [phone] Fax: [phone]  03/13/2024, 11:29 AM

## 2024-03-15 ENCOUNTER — Other Ambulatory Visit: Payer: Self-pay | Admitting: Family Medicine

## 2024-03-17 ENCOUNTER — Other Ambulatory Visit: Payer: Self-pay | Admitting: Family Medicine

## 2024-03-17 NOTE — Telephone Encounter (Signed)
 Copied from CRM [id number]. Topic: Clinical - Medication Refill >> Mar 17, 2024  9:12 AM Howard Macho wrote: Most Recent Primary Care Visit:  Provider: Almira Jaeger  Department: [facility]  Visit Type: OFFICE VISIT  Date: 02/26/2024  Medication: insulin aspart protamine - aspart (NOVOLOG 70/30 FLEXPEN) (70-30) 100 UNIT/ML FlexPen and MM PEN NEEDLES 31G X 8 MM MISC  Has the patient contacted their pharmacy? Yes (Agent: If no, request that the patient contact the pharmacy for the refill. If patient does not wish to contact the pharmacy document the reason why and proceed with request.) (Agent: If yes, when and what did the pharmacy advise?)  Is this the correct pharmacy for this prescription? Yes If no, delete pharmacy and type the correct one.  This is the patient's preferred pharmacy:   [facility] [address] [place] - [address] [place] [address] Phone: [phone] Fax: [phone]   Has the prescription been filled recently? No  Is the patient out of the medication? Yes  Has the patient been seen for an appointment in the last year OR does the patient have an upcoming appointment? Yes  Can we respond through MyChart? Yes  Agent: Please be advised that Rx refills may take up to 3 business days. We ask that you follow-up with your pharmacy.

## 2024-03-17 NOTE — Telephone Encounter (Signed)
 Cancelled reorder for both requests, MM pen needles was refilled today. Called [employer] and patient has refills available as well as one ready for pick up today on his insulin flex plen. Closing encounter.

## 2024-05-02 ENCOUNTER — Other Ambulatory Visit: Payer: Self-pay | Admitting: Family Medicine

## 2024-05-09 ENCOUNTER — Ambulatory Visit (INDEPENDENT_AMBULATORY_CARE_PROVIDER_SITE_OTHER): Admitting: Family Medicine

## 2024-05-09 ENCOUNTER — Encounter: Payer: Self-pay | Admitting: Family Medicine

## 2024-05-09 VITALS — BP 136/70 | HR 73 | Temp 97.4°F | Resp 18 | Ht 75.0 in | Wt 268.2 lb

## 2024-05-09 DIAGNOSIS — J208 Acute bronchitis due to other specified organisms: Secondary | ICD-10-CM

## 2024-05-09 DIAGNOSIS — B9689 Other specified bacterial agents as the cause of diseases classified elsewhere: Secondary | ICD-10-CM

## 2024-05-09 MED ORDER — AZITHROMYCIN 250 MG PO TABS: ORAL_TABLET | ORAL | 0 refills | Status: AC

## 2024-05-09 NOTE — Patient Instructions (Signed)
 Zpack sent.

## 2024-05-09 NOTE — Progress Notes (Signed)
 Subjective:     Patient ID: Paul Paul, male    DOB: Oct 03, 1956, 68 y.o.   MRN: [id number]  Chief Complaint  Patient presents with   Nasal Drip   Cough    Productive cough with yellow mucus Sx started 7 or 8 days ago   Chest Congestion    HPI Discussed the use of AI scribe software for clinical note transcription with the patient, who gave verbal consent to proceed.  History of Present Illness Paul Paul is a 68 year old male who presents with upper respiratory symptoms.  He has been experiencing upper respiratory symptoms for a little over a week, including rhinorrhea, nasal congestion, and pharyngitis. He has a productive cough with yellow sputum and has difficulty sleeping due to these symptoms. He mentions that his symptoms typically progress to bronchitis.  No shortness of breath, fever, or vomiting.   He has a known allergy to doxycycline  and reports that azithromycin  and prednisone  have been effective in the past for similar symptoms. However, prednisone  affects his blood sugar levels, which have been high, with an A1c of 13, though it was 12 in March and his blood sugar is now below 200.  He does not find albuterol  inhalers effective and has not been using them. He typically uses Nyquil for symptom relief but did not take any the previous night.  He works at [employer].    Health Maintenance Due  Topic Date Due   Lung Cancer Screening  Never done   OPHTHALMOLOGY EXAM  12/28/2017   Diabetic kidney evaluation - Urine ACR  02/22/2024   FOOT EXAM  02/22/2024    Past Medical History:  Diagnosis Date   Anxiety    Arthritis    Bell palsy    > 30 years. Left side of face droops a little   Bradycardia    Cerebral aneurysm    Cerebral aneurysm without rupture 06/2011   Middle of Cerebral Artery   Complication of anesthesia 2012   after surgery for cliping of cerebral anerysum- In the room, patient was shaking, and patient would awaken and then "go back out" shaking  continued after he "went back out:   Constipation    Diabetes mellitus    now under control   Dysrhythmia    BRADYCARDIA REQUIRING PACEMAKER .FOLLOWED BY GREGG TAYLOR   HYPERTENSION 12/10/2007   Pacemaker    Shoulder pain    L SHOULDER. RECENT FALL   Sleep apnea    CPAP.NOT WEARING NOW.NEEDS REPLACEMENT PIECE    Past Surgical History:  Procedure Laterality Date   CARDIAC CATHETERIZATION  07/06/11   normal   CEREBRAL ANEURYSM REPAIR  06/2011   "stenting and coiling" per patient   CORONARY STENT INTERVENTION N/A 07/21/2020   Procedure: CORONARY STENT INTERVENTION;  Surgeon: Wenona Hamilton, MD;  Location: [facility];  Service: Cardiovascular;  Laterality: N/A;   FRACTURE SURGERY Left    foot   INSERT / REPLACE / REMOVE PACEMAKER  2012   KNEE SURGERY Left    left; arthoscopic   LEFT HEART CATH AND CORONARY ANGIOGRAPHY N/A 07/21/2020   Procedure: LEFT HEART CATH AND CORONARY ANGIOGRAPHY;  Surgeon: Wenona Hamilton, MD;  [facility]: [facility];  Service: Cardiovascular;  Laterality: N/A;   PPM GENERATOR CHANGEOUT N/A 02/23/2022   Procedure: PPM GENERATOR CHANGEOUT;  Surgeon: Tammie Fall, MD;  Location: [facility] INVASIVE CV LAB;  Service: Cardiovascular;  Laterality: N/A;   rotator cuff surgery  Left 02/2012   septal deviation     SHOULDER ARTHROSCOPY WITH SUBACROMIAL DECOMPRESSION, ROTATOR CUFF REPAIR AND BICEP TENDON REPAIR Right 08/24/2015   Procedure: RIGHT SHOULDER DIAGNOSTIC OPERATIVE ARTHROSCOPY WITH DEBRIDEMENT, SUBACROMIAL DECOMPRESSION, MIMI-OPEN ROTATOR CUFF TEAR REPAIR AND POSSIBLE  BICEPS TENODESIS.  ;  Surgeon: Jasmine Mesi, MD;  Location: [facility];  Service: Orthopedics;  Laterality: Right;  RIGHT SHOULDER DIAGNOSTIC OPERATIVE ARTHROSCOPY, DEBRIDEMENT, SUBACROMIAL DECOMPRESSION, MINI-OPEN ROTATOR CUFF TEAR REPAIR, POSSIB     Current Outpatient Medications:    aspirin  EC 81 MG tablet, Take 81 mg by mouth daily., Disp: , Rfl:    atorvastatin  (LIPITOR ) 80 MG  tablet, TAKE 1 TABLET EVERY DAY, Disp: 90 tablet, Rfl: 3   azithromycin  (ZITHROMAX ) 250 MG tablet, Take 2 tablets on day 1, then 1 tablet daily on days 2 through 5, Disp: 6 tablet, Rfl: 0   furosemide  (LASIX ) 20 MG tablet, Take 1 tablet (20 mg total) by mouth daily as needed for fluid or edema., Disp: 7 tablet, Rfl: 0   glimepiride  (AMARYL ) 4 MG tablet, TAKE 2 TABLETS EVERY DAY BEFORE BREAKFAST, Disp: 180 tablet, Rfl: 3   insulin  aspart protamine - aspart (NOVOLOG  70/30 FLEXPEN) (70-30) 100 UNIT/ML FlexPen, Inject 30-40 Units into the skin 2 (two) times daily., Disp: 21 mL, Rfl: 11   metoprolol  tartrate (LOPRESSOR ) 25 MG tablet, TAKE 1 TABLET EVERY DAY, Disp: 90 tablet, Rfl: 3   MM PEN NEEDLES 31G X 8 MM MISC, USE 1  TWICE DAILY BEFORE A MEAL, Disp: 100 each, Rfl: 0   nitroGLYCERIN  (NITROSTAT ) 0.4 MG SL tablet, Place 1 tablet (0.4 mg total) under the tongue every 5 (five) minutes as needed for chest pain (up to 3 doses. call for help if doesnt resolve after 1 dose.)., Disp: 30 tablet, Rfl: 3  Allergies  Allergen Reactions   Doxycycline  Rash and Nausea Only   Lisinopril Rash   Losartan  Potassium-Hctz Other (See Comments)    Caused dizziness & fatigue   ROS neg/noncontributory except as noted HPI/below      Objective:      BP 136/70   Pulse 73   Temp (!) 97.4 F (36.3 C) (Temporal)   Resp 18   Ht 6\' 3"  (1.905 m)   Wt 268 lb 4 oz (121.7 kg)   SpO2 97%   BMI 33.53 kg/m  Wt Readings from Last 3 Encounters:  05/09/24 268 lb 4 oz (121.7 kg)  03/13/24 265 lb (120.2 kg)  02/26/24 266 lb 3.2 oz (120.7 kg)    Physical Exam   Gen: WDWN NAD HEENT: NCAT, conjunctiva not injected, sclera nonicteric TM WNL B, OP moist, no exudates  NECK:  supple, no thyromegaly, no nodes CARDIAC: RRR, S1S2+ LUNGS: CTAB. No wheezes EXT:  no edema MSK: no gross abnormalities.  NEURO: A&O x3.  CN II-XII intact.  PSYCH: normal mood. Good eye contact     Assessment & Plan:  Acute bacterial  bronchitis  Other orders -     Azithromycin ; Take 2 tablets on day 1, then 1 tablet daily on days 2 through 5  Dispense: 6 tablet; Refill: 0  Assessment and Plan Assessment & Plan Upper Respiratory Infection   Symptoms have persisted for over a week, including rhinorrhea, congestion, and pharyngitis, along with a productive cough with yellow sputum and difficulty sleeping. Similar episodes have previously progressed to bronchitis. There is no fever or dyspnea. He is allergic to doxycycline  and typically responds to azithromycin  and prednisone , but prednisone  is contraindicated due  to poorly controlled diabetes (A1c of 12). avoid prednisone  to prevent hyperglycemia.  Diabetes Mellitus, poorly controlled   Diabetes is poorly controlled with an A1c of 13, although recent blood glucose levels are below 200 mg/dL. Prednisone  is contraindicated due to the risk of hyperglycemia. He is aware of the need for improved diabetes management. Monitor blood glucose levels closely and reassess the diabetes management plan.    Return if symptoms worsen or fail to improve.  Ellsworth Haas, MD

## 2024-06-13 ENCOUNTER — Other Ambulatory Visit: Payer: Self-pay | Admitting: Family Medicine

## 2024-07-10 ENCOUNTER — Telehealth: Payer: Self-pay | Admitting: *Deleted

## 2024-07-10 DIAGNOSIS — E1165 Type 2 diabetes mellitus with hyperglycemia: Secondary | ICD-10-CM

## 2024-07-10 NOTE — Telephone Encounter (Signed)
 I ordered 2 labs-including A1c and liver-you can schedule him for lab visit

## 2024-07-10 NOTE — Telephone Encounter (Signed)
 Copied from CRM [id number]. Topic: Clinical - Request for Lab/Test Order >> Jul 10, 2024  8:08 AM Robinson DEL wrote: Reason for CRM: Patient wants to know if provider would rather have him come in for bloodwork before appointment on 8/14.  Theodis [phone]    Please advise  Elora KRAFT

## 2024-07-10 NOTE — Telephone Encounter (Signed)
Please schedule a lab appt.

## 2024-07-10 NOTE — Telephone Encounter (Signed)
 Spoke with patient and scheduled lab appt for 07/15/2024 2pm.

## 2024-07-15 ENCOUNTER — Other Ambulatory Visit

## 2024-07-15 ENCOUNTER — Other Ambulatory Visit (INDEPENDENT_AMBULATORY_CARE_PROVIDER_SITE_OTHER)

## 2024-07-15 ENCOUNTER — Ambulatory Visit: Payer: Self-pay | Admitting: Family Medicine

## 2024-07-15 DIAGNOSIS — E1165 Type 2 diabetes mellitus with hyperglycemia: Secondary | ICD-10-CM

## 2024-07-15 LAB — HEMOGLOBIN A1C: Hgb A1c MFr Bld: 10.6 % — ABNORMAL HIGH (ref 4.6–6.5)

## 2024-07-15 LAB — COMPREHENSIVE METABOLIC PANEL WITH GFR
ALT: 69 U/L — ABNORMAL HIGH (ref 0–53)
AST: 57 U/L — ABNORMAL HIGH (ref 0–37)
Albumin: 3.6 g/dL (ref 3.5–5.2)
Alkaline Phosphatase: 91 U/L (ref 39–117)
BUN: 15 mg/dL (ref 6–23)
CO2: 26 meq/L (ref 19–32)
Calcium: 9.1 mg/dL (ref 8.4–10.5)
Chloride: 102 meq/L (ref 96–112)
Creatinine, Ser: 0.91 mg/dL (ref 0.40–1.50)
GFR: 86.14 mL/min (ref >60.00)
Glucose, Bld: 303 mg/dL — ABNORMAL HIGH (ref 70–99)
Potassium: 4.2 meq/L (ref 3.5–5.1)
Sodium: 134 meq/L — ABNORMAL LOW (ref 135–145)
Total Bilirubin: 1.3 mg/dL — ABNORMAL HIGH (ref 0.2–1.2)
Total Protein: 7.5 g/dL (ref 6.0–8.3)

## 2024-07-16 ENCOUNTER — Telehealth: Payer: Self-pay | Admitting: Family Medicine

## 2024-07-16 NOTE — Telephone Encounter (Signed)
 Spoke with patient to go over labs from yesterday. Read patient provider remarks and recommendations. Patient is NOT agreeable to endocrinology referral. Will be coming to appt on 07/22/2024.   Copied from CRM [id number]. Topic: Clinical - Lab/Test Results >> Jul 16, 2024 11:38 AM Carlyon D wrote: Reason for CRM: Pt is calling in regards to his lab results he stated he does not use mychart and would like a nurse to call him [phone]

## 2024-07-17 ENCOUNTER — Ambulatory Visit: Admitting: Family Medicine

## 2024-07-21 ENCOUNTER — Other Ambulatory Visit: Payer: Self-pay | Admitting: Internal Medicine

## 2024-07-21 ENCOUNTER — Other Ambulatory Visit: Payer: Self-pay | Admitting: Family Medicine

## 2024-07-22 ENCOUNTER — Ambulatory Visit (INDEPENDENT_AMBULATORY_CARE_PROVIDER_SITE_OTHER): Admitting: Family Medicine

## 2024-07-22 ENCOUNTER — Encounter: Payer: Self-pay | Admitting: Family Medicine

## 2024-07-22 VITALS — BP 128/70 | HR 68 | Temp 97.8°F | Ht 75.0 in | Wt 270.8 lb

## 2024-07-22 DIAGNOSIS — E1169 Type 2 diabetes mellitus with other specified complication: Secondary | ICD-10-CM | POA: Diagnosis not present

## 2024-07-22 DIAGNOSIS — I671 Cerebral aneurysm, nonruptured: Secondary | ICD-10-CM

## 2024-07-22 DIAGNOSIS — R7989 Other specified abnormal findings of blood chemistry: Secondary | ICD-10-CM | POA: Diagnosis not present

## 2024-07-22 DIAGNOSIS — I495 Sick sinus syndrome: Secondary | ICD-10-CM

## 2024-07-22 DIAGNOSIS — Z7984 Long term (current) use of oral hypoglycemic drugs: Secondary | ICD-10-CM | POA: Diagnosis not present

## 2024-07-22 DIAGNOSIS — I2510 Atherosclerotic heart disease of native coronary artery without angina pectoris: Secondary | ICD-10-CM | POA: Diagnosis not present

## 2024-07-22 DIAGNOSIS — E1165 Type 2 diabetes mellitus with hyperglycemia: Secondary | ICD-10-CM

## 2024-07-22 DIAGNOSIS — I10 Essential (primary) hypertension: Secondary | ICD-10-CM | POA: Diagnosis not present

## 2024-07-22 DIAGNOSIS — E785 Hyperlipidemia, unspecified: Secondary | ICD-10-CM

## 2024-07-22 NOTE — Progress Notes (Signed)
 Phone [phone] In person visit   Subjective:   Paul Paul is a 68 y.o. year old very pleasant male patient who presents for/with See problem oriented charting Chief Complaint  Patient presents with   Diabetes    Past Medical History-  Patient Active Problem List   Diagnosis Date Noted   CAD (coronary artery disease) status post drug-eluting stent mid LAD August 2021 07/28/2020    Priority: High   Sinus node dysfunction (HCC) 08/05/2019    Priority: High   Cerebral aneurysm without rupture 01/01/2012    Priority: High   Pacemaker 10/07/2011    Priority: High   Poorly controlled type 2 diabetes mellitus (HCC) 11/09/2008    Priority: High   Aortic atherosclerosis (HCC) 07/28/2020    Priority: Medium    Fatty liver 06/18/2018    Priority: Medium    Hepatotoxicity due to statin drug 10/19/2017    Priority: Medium    Elevated LFTs 08/09/2017    Priority: Medium    GAD (generalized anxiety disorder) 10/11/2016    Priority: Medium    OSA on CPAP 09/24/2014    Priority: Medium    Hyperlipidemia associated with type 2 diabetes mellitus (HCC) 09/24/2014    Priority: Medium    Other specified cardiac dysrhythmias(427.89) 07/09/2012    Priority: Medium    Essential hypertension 12/10/2007    Priority: Medium    Obesity 01/01/2014    Priority: Low   Chronic pruritic rash in adult 08/09/2017    Medications- reviewed and updated Current Outpatient Medications  Medication Sig Dispense Refill   aspirin  EC 81 MG tablet Take 81 mg by mouth daily.     atorvastatin  (LIPITOR ) 80 MG tablet TAKE 1 TABLET EVERY DAY 90 tablet 3   furosemide  (LASIX ) 20 MG tablet Take 1 tablet (20 mg total) by mouth daily as needed for fluid or edema. 7 tablet 0   glimepiride  (AMARYL ) 4 MG tablet TAKE 2 TABLETS EVERY DAY BEFORE BREAKFAST 180 tablet 3   insulin  aspart protamine - aspart (NOVOLOG  70/30 FLEXPEN) (70-30) 100 UNIT/ML FlexPen Inject 30-40 Units into the skin 2 (two) times daily. 21 mL 11    metoprolol  tartrate (LOPRESSOR ) 25 MG tablet TAKE 1 TABLET EVERY DAY 90 tablet 3   MM PEN NEEDLES 31G X 8 MM MISC USE 1  TWICE DAILY BEFORE A MEAL 100 each 0   nitroGLYCERIN  (NITROSTAT ) 0.4 MG SL tablet Place 1 tablet (0.4 mg total) under the tongue every 5 (five) minutes as needed for chest pain (up to 3 doses. call for help if doesnt resolve after 1 dose.). 30 tablet 3   No current facility-administered medications for this visit.     Objective:  BP 128/70 (BP Location: Left Arm, Patient Position: Sitting, Cuff Size: Normal)   Pulse 68   Temp 97.8 F (36.6 C) (Temporal)   Ht 6' 3 (1.905 m)   Wt 270 lb 12.8 oz (122.8 kg)   SpO2 96%   BMI 33.85 kg/m  Gen: NAD, resting comfortably CV: RRR no murmurs rubs or gallops Lungs: CTAB no crackles, wheeze, rhonchi Ext: trace to 1+ edema Skin: warm, dry  Diabetic foot exam was performed with the following findings:   No deformities, ulcerations, or other skin breakdown Normal sensation of 10g monofilament Intact posterior tibialis and dorsalis pedis pulses        Assessment and Plan   #Health maintenance- declines cologuard and colonoscopy even if it means missing colon cancer -declines lung cancer screening  %History  of cerebral aneurysm without rupture. Follows with [place] [facility] - in 2012 had 2 stents and coils.  - overdue for follow up - encouraged him to schedule with [place] radiology/interventional radiology- as I believe Dr. Dolphus retired -he asks about Cialis but I would want him to run this by IR first -no headache(s) or blurry vision  #Diabetes mellitus-poor control. Metformin  intolerant- GI. S: compliant with 8mg  glimepiride  daily, NovoLog  70/30 with  40 units in am and 30 units in pm  No sugars under 70. This morning was 130- has started improving diet after seeing a1c results for last few days and is seeing improvement. Challenging sometimes to eat well and exercise caring for wife. Exercising twice a day for most  part -Jardiance  was too costly in 2023 but was helpful Lab Results  Component Value Date   HGBA1C 10.6 (H) 07/15/2024   HGBA1C 12.0 (H) 02/26/2024   HGBA1C 11.6 (H) 10/15/2023  A/P: diabetes improving and since a1c he has made even more drastic changes to diet/exercise and is seeing sugars really come down- offered endocrine or upping insulin  but hed prefer to simply recheck in 14 weeks -offered UACR- wants to do next time as just urinated   #Hypertension S: Controlled on metoprolol  25 mg twice daily BP Readings from Last 3 Encounters:  07/22/24 128/70  05/09/24 136/70  03/13/24 (!) 160/89  A/P: well controlled continue current medications   #CAD - asymptomatic  #Hyperlipidemia/elevated LFTs S: Medication: Atorvastatin  80 mg, aspirin  81 mg  patient has been off of statin due to elevated LFTs.  History of hepatotoxicity due to statins. As logn as LFTs under 100 for AST and ALT we have opted to monitor Lab Results  Component Value Date   CHOL 123 02/26/2024   HDL 39.90 02/26/2024   LDLCALC 54 02/26/2024   LDLDIRECT 104.0 08/09/2017   TRIG 150.0 (H) 02/26/2024   CHOLHDL 3 02/26/2024  A/P: lipids at goal- continue current medications . LFTs stable with AST and ALT under 100- plus working on healthy eating and regular exercise may help if fatty liver component   #pacemaker due to history symptomatic bradycardia S: Patient follows with Dr. Waddell.  He has a pacemaker due to history of symptomatic bradycardia A/P: asymptomatic - continue to monitor with cardiology  Recommended follow up: Return in about 14 weeks (around 10/28/2024) for followup or sooner if needed.Schedule b4 you leave. Future Appointments  Date Time Provider Department Center  12/16/2024  8:00 AM [facility] ANNUAL WELLNESS VISIT 1 [facility]    Lab/Order associations:   ICD-10-CM   1. Cerebral aneurysm without rupture  I67.1     2. Poorly controlled type 2 diabetes mellitus (HCC)  E11.65     3. Sinus node  dysfunction (HCC)  I49.5     4. Coronary artery disease involving native coronary artery of native heart without angina pectoris  I25.10     5. Elevated LFTs  R79.89     6. Hyperlipidemia associated with type 2 diabetes mellitus (HCC)  E11.69    E78.5     7. Essential hypertension  I10       No orders of the defined types were placed in this encounter.   Return precautions advised.  Garnette Lukes, MD

## 2024-07-22 NOTE — Patient Instructions (Addendum)
-   overdue for follow up - encouraged him to schedule with [place] radiology/interventional radiology- as I believe Dr. Dolphus retired  Recommended follow up: Return in about 14 weeks (around 10/28/2024) for followup or sooner if needed.Schedule b4 you leave. - schedule labs November 20th or later- has to be at least 91 days

## 2024-08-15 ENCOUNTER — Other Ambulatory Visit: Payer: Self-pay | Admitting: Family Medicine

## 2024-08-21 ENCOUNTER — Other Ambulatory Visit: Payer: Self-pay | Admitting: Internal Medicine

## 2024-09-10 ENCOUNTER — Other Ambulatory Visit: Payer: Self-pay | Admitting: Internal Medicine

## 2024-09-26 ENCOUNTER — Other Ambulatory Visit: Payer: Self-pay | Admitting: Family Medicine

## 2024-10-01 ENCOUNTER — Other Ambulatory Visit: Payer: Self-pay | Admitting: Internal Medicine

## 2024-10-07 ENCOUNTER — Other Ambulatory Visit (INDEPENDENT_AMBULATORY_CARE_PROVIDER_SITE_OTHER): Payer: Self-pay

## 2024-10-07 ENCOUNTER — Encounter: Payer: Self-pay | Admitting: Physician Assistant

## 2024-10-07 ENCOUNTER — Ambulatory Visit: Admitting: Physician Assistant

## 2024-10-07 DIAGNOSIS — M25561 Pain in right knee: Secondary | ICD-10-CM

## 2024-10-07 MED ORDER — MELOXICAM 7.5 MG PO TABS: 7.5000 mg | ORAL_TABLET | Freq: Every day | ORAL | 0 refills | Status: DC

## 2024-10-07 NOTE — Progress Notes (Signed)
 Office Visit Note   Patient: Paul Paul           Date of Birth: 02/14/1956           MRN: [id number] Visit Date: 10/07/2024              Requested by: Katrinka Garnette KIDD, MD [address] [place],  [place] [address] PCP: Katrinka Garnette KIDD, MD   Assessment & Plan: Visit Diagnoses:  1. Acute pain of right knee     Plan: Patient is a pleasant 68 year old gentleman who is a patient of Dr. Sedrick.  Comes in today with a 4-day history of right knee pain mostly medially and posteriorly.  Has a history of problems with both of his knees.  Denies any injury.  Denies any fever or chills.  Exam today does not reveal any significant effusion.  Some tenderness over the medial joint line and posterior medial joint line.  More pain with flexion and extension but his stability is intact.  Findings could be consistent with a degenerative meniscus tear or just an inflammation of his arthritis.  Unfortunately I cannot offer him a steroid injection today as his most recent hemoglobin A1c was 12.  He understands this.  He cannot take anti-inflammatories so I will place him on meloxicam once a day.  He knows not to take this with Advil or Aleve.  Will also discussed with him using topical Voltaren gel.  As this has been only been going on for 4 days he can try conservative treatment and follow-up with Dr. Addie.  He has multiple stents and a pacemaker so was not eligible for MRIs  Follow-Up Instructions: Return in about 3 weeks (around 10/28/2024).   Orders:  Orders Placed This Encounter  Procedures   XR Knee 1-2 Views Right   Meds ordered this encounter  Medications   meloxicam (MOBIC) 7.5 MG tablet    Sig: Take 1 tablet (7.5 mg total) by mouth daily.    Dispense:  30 tablet    Refill:  0      Procedures: No procedures performed   Clinical Data: No additional findings.   Subjective: Chief Complaint  Patient presents with   Right Knee - Pain    HPI Patient is a pleasant 68 year old  gentleman comes in with right knee pain which started 4 days ago denies any injury he is taking Tylenol  with no relief Review of Systems  All other systems reviewed and are negative.    Objective: Vital Signs: There were no vitals taken for this visit.  Physical Exam Constitutional:      Appearance: Normal appearance.  Pulmonary:     Effort: Pulmonary effort is normal.  Skin:    General: Skin is warm and dry.  Neurological:     General: No focal deficit present.     Mental Status: He is alert and oriented to person, place, and time.  Psychiatric:        Mood and Affect: Mood normal.        Behavior: Behavior normal.     Ortho Exam Examination of his right knee no erythema only very small effusion compartments are soft and compressible he has good strength with resisted extension and flexion.  Little more tender medially and posterior medially no calf pain negative Homans' sign neurovascular intact more pain with flexion and terminal extension Specialty Comments:  No specialty comments available.  Imaging: XR Knee 1-2 Views Right Result Date: 10/07/2024 Imaging of the  right knee demonstrate no acute fractures some sclerotic changes    PMFS History: Patient Active Problem List   Diagnosis Date Noted   Aortic atherosclerosis 07/28/2020   CAD (coronary artery disease) status post drug-eluting stent mid LAD August 2021 07/28/2020   Sinus node dysfunction (HCC) 08/05/2019   Fatty liver 06/18/2018   Hepatotoxicity due to statin drug 10/19/2017   Elevated LFTs 08/09/2017   Chronic pruritic rash in adult 08/09/2017   GAD (generalized anxiety disorder) 10/11/2016   OSA on CPAP 09/24/2014   Hyperlipidemia associated with type 2 diabetes mellitus (HCC) 09/24/2014   Obesity 01/01/2014   Other specified cardiac dysrhythmias(427.89) 07/09/2012   Cerebral aneurysm without rupture 01/01/2012   Pacemaker 10/07/2011   Poorly controlled type 2 diabetes mellitus (HCC) 11/09/2008    Essential hypertension 12/10/2007   Past Medical History:  Diagnosis Date   Anxiety    Arthritis    Bell palsy    > 30 years. Left side of face droops a little   Bradycardia    Cerebral aneurysm    Cerebral aneurysm without rupture 06/2011   Middle of Cerebral Artery   Complication of anesthesia 2012   after surgery for cliping of cerebral anerysum- In the room, patient was shaking, and patient would awaken and then go back out shaking continued after he went back out:   Constipation    Diabetes mellitus    now under control   Dysrhythmia    BRADYCARDIA REQUIRING PACEMAKER .FOLLOWED BY GREGG TAYLOR   HYPERTENSION 12/10/2007   Pacemaker    Shoulder pain    L SHOULDER. RECENT FALL   Sleep apnea    CPAP.NOT WEARING NOW.NEEDS REPLACEMENT PIECE    Family History  Problem Relation Age of Onset   Heart attack Mother        died 37, smoker   Heart disease Mother    Diabetes Mother    Heart attack Father        died 5, smoker   Heart disease Father     Past Surgical History:  Procedure Laterality Date   CARDIAC CATHETERIZATION  07/06/11   normal   CEREBRAL ANEURYSM REPAIR  06/2011   stenting and coiling per patient   CORONARY STENT INTERVENTION N/A 07/21/2020   Procedure: CORONARY STENT INTERVENTION;  Surgeon: Darron Deatrice LABOR, MD;  Location: [facility];  Service: Cardiovascular;  Laterality: N/A;   FRACTURE SURGERY Left    foot   INSERT / REPLACE / REMOVE PACEMAKER  2012   KNEE SURGERY Left    left; arthoscopic   LEFT HEART CATH AND CORONARY ANGIOGRAPHY N/A 07/21/2020   Procedure: LEFT HEART CATH AND CORONARY ANGIOGRAPHY;  Surgeon: Darron Deatrice LABOR, MD;  Location: [facility];  Service: Cardiovascular;  Laterality: N/A;   PPM GENERATOR CHANGEOUT N/A 02/23/2022   Procedure: PPM GENERATOR CHANGEOUT;  Surgeon: Waddell Danelle ORN, MD;  Location: [facility];  Service: Cardiovascular;  Laterality: N/A;   rotator cuff surgery Left 02/2012   septal deviation      SHOULDER ARTHROSCOPY WITH SUBACROMIAL DECOMPRESSION, ROTATOR CUFF REPAIR AND BICEP TENDON REPAIR Right 08/24/2015   Procedure: RIGHT SHOULDER DIAGNOSTIC OPERATIVE ARTHROSCOPY WITH DEBRIDEMENT, SUBACROMIAL DECOMPRESSION, MIMI-OPEN ROTATOR CUFF TEAR REPAIR AND POSSIBLE  BICEPS TENODESIS.  ;  Surgeon: Glendia Cordella Hutchinson, MD;  Location: [facility] OR;  Service: Orthopedics;  Laterality: Right;  RIGHT SHOULDER DIAGNOSTIC OPERATIVE ARTHROSCOPY, DEBRIDEMENT, SUBACROMIAL DECOMPRESSION, MINI-OPEN ROTATOR CUFF TEAR REPAIR, POSSIB   Social History   Occupational History  Occupation: Disabled  Tobacco Use   Smoking status: Former    Current packs/day: 0.00    Average packs/day: 1 pack/day for 20.0 years (20.0 ttl pk-yrs)    Types: Cigarettes    Start date: 07/05/1991    Quit date: 07/05/2011    Years since quitting: 13.2   Smokeless tobacco: Never  Vaping Use   Vaping status: Never Used  Substance and Sexual Activity   Alcohol use: No    Alcohol/week: 0.0 standard drinks of alcohol   Drug use: No   Sexual activity: Not on file

## 2024-11-03 ENCOUNTER — Telehealth: Payer: Self-pay | Admitting: Family Medicine

## 2024-11-03 NOTE — Telephone Encounter (Unsigned)
 Copied from CRM #[id number]. Topic: Clinical - Medication Refill >> Nov 03, 2024  1:44 PM Corin V wrote: Medication: metoprolol  tartrate (LOPRESSOR ) 25 MG tablet  Has the patient contacted their pharmacy? Yes (Agent: If no, request that the patient contact the pharmacy for the refill. If patient does not wish to contact the pharmacy document the reason why and proceed with request.) (Agent: If yes, when and what did the pharmacy advise?)  This is the patient's preferred pharmacy:  [facility] - [place], [place] - [address] [place] [address] Phone: [phone] Fax: [phone]  Is this the correct pharmacy for this prescription? Yes If no, delete pharmacy and type the correct one.   Has the prescription been filled recently? No  Is the patient out of the medication? No  Has the patient been seen for an appointment in the last year OR does the patient have an upcoming appointment? Yes  Can we respond through MyChart? Yes  Agent: Please be advised that Rx refills may take up to 3 business days. We ask that you follow-up with your pharmacy.

## 2024-11-04 MED ORDER — METOPROLOL TARTRATE 25 MG PO TABS: 25.0000 mg | ORAL_TABLET | Freq: Two times a day (BID) | ORAL | 0 refills | Status: DC

## 2024-11-05 ENCOUNTER — Ambulatory Visit: Admitting: Orthopedic Surgery

## 2024-11-05 DIAGNOSIS — M25561 Pain in right knee: Secondary | ICD-10-CM

## 2024-11-06 ENCOUNTER — Encounter: Payer: Self-pay | Admitting: Orthopedic Surgery

## 2024-11-06 NOTE — Progress Notes (Signed)
 Office Visit Note   Patient: Paul Paul           Date of Birth: 1956/06/29           MRN: [id number] Visit Date: 11/05/2024 Requested by: Katrinka Garnette KIDD, MD [address] [place],  [place] [address] PCP: Katrinka Garnette KIDD, MD  Subjective: Chief Complaint  Patient presents with   Right Knee - Pain    HPI: KRIST ROSENBOOM is a 68 y.o. male who presents to the office reporting right knee pain.  The pain has been going on for 6 weeks.  He cannot have an MRI scan because of the pacemaker.  No injection possible due to recent A1c over 12.  Medication over-the-counter has helped some.  Describes pain in both the anterior and posterior aspect of the knee.  No mechanical symptoms.  Did not have a problem like this 4 months ago.  It is interfering a lot with his ability to ambulate and do activities of daily living..                ROS: All systems reviewed are negative as they relate to the chief complaint within the history of present illness.  Patient denies fevers or chills.  Assessment & Plan: Visit Diagnoses:  1. Acute pain of right knee     Plan: Impression is possible right knee meniscal pathology in a patient who cannot really have an MRI scan because of his pacemaker.  Plan CT arthrogram right knee to evaluate primarily posterior pain.  Could be a meniscal root avulsion although he does not have an effusion.  Alternatively could be meniscal pathology.  Will see what the CT scan shows.  Does not have much arthritis on plain radiographs.  Follow-Up Instructions: Return in about 4 weeks (around 12/03/2024).   Orders:  Orders Placed This Encounter  Procedures   CT KNEE RIGHT W CONTRAST   Arthrogram   No orders of the defined types were placed in this encounter.     Procedures: No procedures performed   Clinical Data: No additional findings.  Objective: Vital Signs: There were no vitals taken for this visit.  Physical Exam:  Constitutional: Patient appears  well-developed HEENT:  Head: Normocephalic Eyes:EOM are normal Neck: Normal range of motion Cardiovascular: Normal rate Pulmonary/chest: Effort normal Neurologic: Patient is alert Skin: Skin is warm Psychiatric: Patient has normal mood and affect  Ortho Exam: Ortho exam demonstrates slightly antalgic to the right.  No nerve root tension signs and no groin pain with internal or external rotation of the leg.  No masses lymphadenopathy or skin changes noted in the right knee region.  Has good range of motion with slight patellofemoral crepitus.  Does have some medial and lateral joint line tenderness but no Baker's cyst is palpable.  Specialty Comments:  No specialty comments available.  Imaging: No results found.   PMFS History: Patient Active Problem List   Diagnosis Date Noted   Aortic atherosclerosis 07/28/2020   CAD (coronary artery disease) status post drug-eluting stent mid LAD August 2021 07/28/2020   Sinus node dysfunction (HCC) 08/05/2019   Fatty liver 06/18/2018   Hepatotoxicity due to statin drug 10/19/2017   Elevated LFTs 08/09/2017   Chronic pruritic rash in adult 08/09/2017   GAD (generalized anxiety disorder) 10/11/2016   OSA on CPAP 09/24/2014   Hyperlipidemia associated with type 2 diabetes mellitus (HCC) 09/24/2014   Obesity 01/01/2014   Other specified cardiac dysrhythmias(427.89) 07/09/2012   Cerebral aneurysm  without rupture 01/01/2012   Pacemaker 10/07/2011   Poorly controlled type 2 diabetes mellitus (HCC) 11/09/2008   Essential hypertension 12/10/2007   Past Medical History:  Diagnosis Date   Anxiety    Arthritis    Bell palsy    > 30 years. Left side of face droops a little   Bradycardia    Cerebral aneurysm    Cerebral aneurysm without rupture 06/2011   Middle of Cerebral Artery   Complication of anesthesia 2012   after surgery for cliping of cerebral anerysum- In the room, patient was shaking, and patient would awaken and then go back out  shaking continued after he went back out:   Constipation    Diabetes mellitus    now under control   Dysrhythmia    BRADYCARDIA REQUIRING PACEMAKER .FOLLOWED BY GREGG TAYLOR   HYPERTENSION 12/10/2007   Pacemaker    Shoulder pain    L SHOULDER. RECENT FALL   Sleep apnea    CPAP.NOT WEARING NOW.NEEDS REPLACEMENT PIECE    Family History  Problem Relation Age of Onset   Heart attack Mother        died 58, smoker   Heart disease Mother    Diabetes Mother    Heart attack Father        died 19, smoker   Heart disease Father     Past Surgical History:  Procedure Laterality Date   CARDIAC CATHETERIZATION  07/06/11   normal   CEREBRAL ANEURYSM REPAIR  06/2011   stenting and coiling per patient   CORONARY STENT INTERVENTION N/A 07/21/2020   Procedure: CORONARY STENT INTERVENTION;  Surgeon: Darron Deatrice LABOR, MD;  Location: [facility];  Service: Cardiovascular;  Laterality: N/A;   FRACTURE SURGERY Left    foot   INSERT / REPLACE / REMOVE PACEMAKER  2012   KNEE SURGERY Left    left; arthoscopic   LEFT HEART CATH AND CORONARY ANGIOGRAPHY N/A 07/21/2020   Procedure: LEFT HEART CATH AND CORONARY ANGIOGRAPHY;  Surgeon: Darron Deatrice LABOR, MD;  Location: [facility];  Service: Cardiovascular;  Laterality: N/A;   PPM GENERATOR CHANGEOUT N/A 02/23/2022   Procedure: PPM GENERATOR CHANGEOUT;  Surgeon: Waddell Danelle ORN, MD;  Location: [facility] INVASIVE CV LAB;  Service: Cardiovascular;  Laterality: N/A;   rotator cuff surgery Left 02/2012   septal deviation     SHOULDER ARTHROSCOPY WITH SUBACROMIAL DECOMPRESSION, ROTATOR CUFF REPAIR AND BICEP TENDON REPAIR Right 08/24/2015   Procedure: RIGHT SHOULDER DIAGNOSTIC OPERATIVE ARTHROSCOPY WITH DEBRIDEMENT, SUBACROMIAL DECOMPRESSION, MIMI-OPEN ROTATOR CUFF TEAR REPAIR AND POSSIBLE  BICEPS TENODESIS.  ;  Surgeon: Glendia Cordella Hutchinson, MD;  Location: [facility];  Service: Orthopedics;  Laterality: Right;  RIGHT SHOULDER DIAGNOSTIC OPERATIVE ARTHROSCOPY,  DEBRIDEMENT, SUBACROMIAL DECOMPRESSION, MINI-OPEN ROTATOR CUFF TEAR REPAIR, POSSIB   Social History   Occupational History   Occupation: Disabled  Tobacco Use   Smoking status: Former    Current packs/day: 0.00    Average packs/day: 1 pack/day for 20.0 years (20.0 ttl pk-yrs)    Types: Cigarettes    Start date: 07/05/1991    Quit date: 07/05/2011    Years since quitting: 13.3   Smokeless tobacco: Never  Vaping Use   Vaping status: Never Used  Substance and Sexual Activity   Alcohol use: No    Alcohol/week: 0.0 standard drinks of alcohol   Drug use: No   Sexual activity: Not on file

## 2024-11-08 ENCOUNTER — Other Ambulatory Visit: Payer: Self-pay | Admitting: Family Medicine

## 2024-11-10 ENCOUNTER — Telehealth: Payer: Self-pay | Admitting: Orthopedic Surgery

## 2024-11-10 ENCOUNTER — Telehealth: Payer: Self-pay | Admitting: Family Medicine

## 2024-11-10 ENCOUNTER — Other Ambulatory Visit: Payer: Self-pay | Admitting: Family Medicine

## 2024-11-10 NOTE — Telephone Encounter (Signed)
 Pt called wanting to know if he needs to keep taking his medication, Meloxicam  for his CT scan. Call back number is [phone]

## 2024-11-10 NOTE — Telephone Encounter (Signed)
 Noted.    Copied from CRM [id number]. Topic: Clinical - Medication Question >> Nov 10, 2024  8:43 AM Berneda FALCON wrote: Reason for CRM: Patient calling in to check the status of his medication refill for his Novalog and  his Metoprolol . I let him know both were sent in for him and that the PCP would like to see him in office for further refills (per notes in the med list) and he states he has an upcoming appt with PCP and will do that at this appt.

## 2024-11-10 NOTE — Telephone Encounter (Signed)
 I called and talked to the Paul Paul. He wanted to know if he can have a refill of the meloxicam . [place] he was out. Ok to refill?

## 2024-11-12 NOTE — Telephone Encounter (Signed)
 Yes thx 15 every day prn 30 1 rf

## 2024-11-13 MED ORDER — MELOXICAM 15 MG PO TABS: 15.0000 mg | ORAL_TABLET | Freq: Every day | ORAL | 0 refills | Status: DC

## 2024-11-13 NOTE — Telephone Encounter (Signed)
 Sent to pharmacy

## 2024-11-14 ENCOUNTER — Encounter: Payer: Self-pay | Admitting: Family Medicine

## 2024-11-14 ENCOUNTER — Ambulatory Visit: Admitting: Family Medicine

## 2024-11-14 ENCOUNTER — Ambulatory Visit: Payer: Self-pay | Admitting: Family Medicine

## 2024-11-14 VITALS — BP 138/66 | HR 67 | Temp 97.3°F | Ht 75.0 in | Wt 273.4 lb

## 2024-11-14 DIAGNOSIS — Z23 Encounter for immunization: Secondary | ICD-10-CM

## 2024-11-14 DIAGNOSIS — I671 Cerebral aneurysm, nonruptured: Secondary | ICD-10-CM

## 2024-11-14 DIAGNOSIS — E1165 Type 2 diabetes mellitus with hyperglycemia: Secondary | ICD-10-CM

## 2024-11-14 DIAGNOSIS — E1169 Type 2 diabetes mellitus with other specified complication: Secondary | ICD-10-CM

## 2024-11-14 DIAGNOSIS — I2510 Atherosclerotic heart disease of native coronary artery without angina pectoris: Secondary | ICD-10-CM

## 2024-11-14 LAB — COMPREHENSIVE METABOLIC PANEL WITH GFR
ALT: 41 U/L (ref 0–53)
AST: 33 U/L (ref 0–37)
Albumin: 3.7 g/dL (ref 3.5–5.2)
Alkaline Phosphatase: 72 U/L (ref 39–117)
BUN: 24 mg/dL — ABNORMAL HIGH (ref 6–23)
CO2: 24 meq/L (ref 19–32)
Calcium: 9.2 mg/dL (ref 8.4–10.5)
Chloride: 102 meq/L (ref 96–112)
Creatinine, Ser: 1.11 mg/dL (ref 0.40–1.50)
GFR: 67.71 mL/min (ref >60.00)
Glucose, Bld: 383 mg/dL — ABNORMAL HIGH (ref 70–99)
Potassium: 4.4 meq/L (ref 3.5–5.1)
Sodium: 133 meq/L — ABNORMAL LOW (ref 135–145)
Total Bilirubin: 0.7 mg/dL (ref 0.2–1.2)
Total Protein: 7.5 g/dL (ref 6.0–8.3)

## 2024-11-14 LAB — MICROALBUMIN / CREATININE URINE RATIO
Creatinine,U: 43.9 mg/dL
Microalb Creat Ratio: UNDETERMINED mg/g (ref 0.0–30.0)
Microalb, Ur: <0.7 mg/dL

## 2024-11-14 LAB — HEMOGLOBIN A1C: Hgb A1c MFr Bld: 10.6 % — ABNORMAL HIGH (ref 4.6–6.5)

## 2024-11-14 MED ORDER — MM PEN NEEDLES 31G X 8 MM MISC: 3 refills | Status: AC

## 2024-11-14 MED ORDER — NOVOLOG MIX 70/30 FLEXPEN (70-30) 100 UNIT/ML ~~LOC~~ SUPN: 30.0000 [IU] | PEN_INJECTOR | Freq: Two times a day (BID) | SUBCUTANEOUS | 11 refills | Status: AC

## 2024-11-14 MED ORDER — GLIMEPIRIDE 4 MG PO TABS: 8.0000 mg | ORAL_TABLET | Freq: Every day | ORAL | 3 refills | Status: AC

## 2024-11-14 MED ORDER — METOPROLOL TARTRATE 25 MG PO TABS: 25.0000 mg | ORAL_TABLET | Freq: Two times a day (BID) | ORAL | 3 refills | Status: AC

## 2024-11-14 NOTE — Progress Notes (Signed)
 Phone [phone] In person visit   Subjective:   Paul Paul is a 68 y.o. year old very pleasant male patient who presents for/with See problem oriented charting Chief Complaint  Patient presents with   Toe Injury    Left big toe and then right 2nd toe turning Black. No known injury.    Medication Management    Pharmacy says there is issues with the prescription and needle numbers. BP med refill needed.    Hypertension   Past Medical History-  Patient Active Problem List   Diagnosis Date Noted   CAD (coronary artery disease) status post drug-eluting stent mid LAD August 2021 07/28/2020    Priority: High   Sinus node dysfunction (HCC) 08/05/2019    Priority: High   Cerebral aneurysm without rupture 01/01/2012    Priority: High   Pacemaker 10/07/2011    Priority: High   Poorly controlled type 2 diabetes mellitus (HCC) 11/09/2008    Priority: High   Aortic atherosclerosis 07/28/2020    Priority: Medium    Fatty liver 06/18/2018    Priority: Medium    Hepatotoxicity due to statin drug 10/19/2017    Priority: Medium    Elevated LFTs 08/09/2017    Priority: Medium    GAD (generalized anxiety disorder) 10/11/2016    Priority: Medium    OSA on CPAP 09/24/2014    Priority: Medium    Hyperlipidemia associated with type 2 diabetes mellitus (HCC) 09/24/2014    Priority: Medium    Other specified cardiac dysrhythmias(427.89) 07/09/2012    Priority: Medium    Essential hypertension 12/10/2007    Priority: Medium    Obesity 01/01/2014    Priority: Low   Chronic pruritic rash in adult 08/09/2017    Medications- reviewed and updated Current Outpatient Medications  Medication Sig Dispense Refill   aspirin  EC 81 MG tablet Take 81 mg by mouth daily.     meloxicam  (MOBIC ) 15 MG tablet Take 1 tablet (15 mg total) by mouth daily. 30 tablet 0   atorvastatin  (LIPITOR ) 80 MG tablet TAKE 1 TABLET EVERY DAY 90 tablet 3   furosemide  (LASIX ) 20 MG tablet Take 1 tablet (20 mg total) by  mouth daily as needed for fluid or edema. 7 tablet 0   glimepiride  (AMARYL ) 4 MG tablet Take 2 tablets (8 mg total) by mouth daily with breakfast. 180 tablet 3   Insulin  Pen Needle (MM PEN NEEDLES) 31G X 8 MM MISC USE 1  TWICE DAILY BEFORE A MEAL 180 each 3   metoprolol  tartrate (LOPRESSOR ) 25 MG tablet Take 1 tablet (25 mg total) by mouth 2 (two) times daily. TAKE 1 TABLET TWICE DAILY 180 tablet 3   nitroGLYCERIN  (NITROSTAT ) 0.4 MG SL tablet Place 1 tablet (0.4 mg total) under the tongue every 5 (five) minutes as needed for chest pain (up to 3 doses. call for help if doesnt resolve after 1 dose.). (Patient not taking: Reported on 11/14/2024) 30 tablet 3   NOVOLOG  MIX 70/30 FLEXPEN (70-30) 100 UNIT/ML FlexPen Inject 30-42 Units into the skin 2 (two) times daily with a meal. 42 units in am, 30 units in pm 30 mL 11   No current facility-administered medications for this visit.     Objective:  BP 138/66 (BP Location: Left Arm, Patient Position: Sitting, Cuff Size: Large)   Pulse 67   Temp (!) 97.3 F (36.3 C) (Temporal)   Ht 6' 3 (1.905 m)   Wt 273 lb 6.4 oz (124 kg)   SpO2  95%   BMI 34.17 kg/m  Gen: NAD, resting comfortably CV: RRR no murmurs rubs or gallops Lungs: CTAB no crackles, wheeze, rhonchi Ext: trace edema- no recent lasix  needed Skin: warm, dry   Immunization History  Administered Date(s) Administered   Fluad Quad(high Dose 65+) 10/02/2019, 09/28/2020, 01/12/2022, 09/15/2022   Fluad Trivalent(High Dose 65+) 10/02/2023   Influenza Split 11/06/2011, 09/05/2012   Influenza,inj,Quad PF,6+ Mos 09/24/2014, 09/18/2018   Janssen (J&J) SARS-COV-2 Vaccination 02/15/2020   Pneumococcal Conjugate-13 04/23/2018   Pneumococcal Polysaccharide-23 07/26/2012, 12/17/2020   Tdap 01/01/2012       Assessment and Plan   # Toenail injury S:reports left big toenial and right 2nd toe with color change. Reports there is no known injury.   A/P: appears he may have injured this and not  recalled- with it being on 2 different toes doubt this is related to a melanoma- offered podiatry or dermatology refer but he declines  %History of cerebral aneurysm without rupture. Follows with [place] radiology - in 2012 had 2 stents and coils. Used to see Dr. Dolphus . Will update referral today to neurosurgery within [facility] - he agrees  #Diabetes mellitus-poor control. Metformin  intolerant- GI. S: compliant with 8mg  glimepiride  daily,-NovoLog  70/30 with  40 units in am and 30 units in pm  -sugars vary with his eating and exercise- as low as 120. Most of the time in morning 170 - Large swings in sugars in past as far as control with improved diet/exercise.  -Jardiance  was too costly in 2023 but was helpful -reports working out tiwce a day Lab Results  Component Value Date   HGBA1C 10.6 (H) 07/15/2024   HGBA1C 12.0 (H) 02/26/2024   HGBA1C 11.6 (H) 10/15/2023  A/P: very poorly controlled.  -offered diabetes referral he declines -Cost major concern- discussed referral to [facility] to see if they could help with medication cost and other options for him. He did well on jardiance  for instance - opts out -declines Mounjaro and jardiance . He wants to focus on lifestyle change   #Hypertension S: Controlled on metoprolol  25 mg twice daily but he's only taking once a day BP Readings from Last 3 Encounters:  11/14/24 138/66  07/22/24 128/70  05/09/24 136/70  A/P: blood pressure high acceptable but he's only takign the metoprolol  once a day- agrees to go to twice a day and I think that will help  #CAD  #Hyperlipidemia/elevated LFTs S: Medication: Atorvastatin  80 mg, aspirin  81 mg  patient has been off of statin due to elevated LFTs.  History of hepatotoxicity due to statins. Interestingly LFTs improved when patient started atorvastatin  80 mg- mild elevations on last check -no chest pain or shortness of breath  Lab Results  Component Value Date   ALT 69 (H) 07/15/2024   AST 57 (H) 07/15/2024    ALKPHOS 91 07/15/2024   BILITOT 1.3 (H) 07/15/2024  -Prior trials: We tried lovastatin  20mg  once a week but had stomach upset on this.  Lab Results  Component Value Date   CHOL 123 02/26/2024   HDL 39.90 02/26/2024   LDLCALC 54 02/26/2024   LDLDIRECT 104.0 08/09/2017   TRIG 150.0 (H) 02/26/2024   CHOLHDL 3 02/26/2024  A/P: coronary artery disease asymptomatic continue current medications Lipids at goal- continue current medications- recheck next visit. Check LFTs- could be statin related elevatoins but if increases more than 3 times ULN may need ultrasound   #pacemaker due to history symptomatic bradycardia S: Patient follows with Dr. Waddell.  He has a pacemaker  due to history of symptomatic bradycardia A/P: doing well and has upcoming cardiology visit  #OSA- but doesn't use CPAP - ideally would  Recommended follow up: Return in about 4 months (around 03/15/2025) for followup or sooner if needed.Schedule b4 you leave. Future Appointments  Date Time Provider Department Center  11/20/2024  2:00 PM [facility] RM 3 [facility]  11/20/2024  2:20 PM [facility] CT 1 [facility]  12/03/2024  1:30 PM Aniceto Daphne CROME, NP [facility]  12/16/2024  8:00 AM [facility] ANNUAL WELLNESS VISIT 1 [facility] Willo Milian    Lab/Order associations:   ICD-10-CM   1. Poorly controlled type 2 diabetes mellitus (HCC)  E11.65     2. Cerebral aneurysm without rupture  I67.1 Ambulatory referral to Neurosurgery    3. Coronary artery disease involving native coronary artery of native heart without angina pectoris  I25.10     4. Hyperlipidemia associated with type 2 diabetes mellitus (HCC)  E11.69    E78.5     5. Immunization due  Z23 Flu vaccine HIGH DOSE PF(Fluzone Trivalent)      Meds ordered this encounter  Medications   metoprolol  tartrate (LOPRESSOR ) 25 MG tablet    Sig: Take 1 tablet (25 mg total) by mouth 2 (two) times daily. TAKE 1 TABLET TWICE DAILY    Dispense:  180  tablet    Refill:  3   glimepiride  (AMARYL ) 4 MG tablet    Sig: Take 2 tablets (8 mg total) by mouth daily with breakfast.    Dispense:  180 tablet    Refill:  3   Insulin  Pen Needle (MM PEN NEEDLES) 31G X 8 MM MISC    Sig: USE 1  TWICE DAILY BEFORE A MEAL    Dispense:  180 each    Refill:  3   NOVOLOG  MIX 70/30 FLEXPEN (70-30) 100 UNIT/ML FlexPen    Sig: Inject 30-42 Units into the skin 2 (two) times daily with a meal. 42 units in am, 30 units in pm    Dispense:  30 mL    Refill:  11    Return precautions advised.  Garnette Lukes, MD

## 2024-11-14 NOTE — Patient Instructions (Addendum)
 Flu shot today  Increase insulin  to 42 units in the morning- update me in 1 month with home morning sugar readings and we may adjust further  Update your eye exam and have the send us  a copy  We have placed a referral for you today to neurosurgery- please call their # if you do not hear within a week (may be listed below or you may see mychart message within a few days with #).   blood pressure high acceptable but he's only takign the metoprolol  once a day- agrees to go to twice a day and I think that will help  Please stop by lab before you go - labs we ordered 07/22/24 If you have mychart- we will send your results within 3 business days of us  receiving them.  If you do not have mychart- we will call you about results within 5 business days of us  receiving them.  *please also note that you will see labs on mychart as soon as they post. I will later go in and write notes on them- will say notes from Dr. Katrinka   Recommended follow up: Return in about 4 months (around 03/15/2025) for followup or sooner if needed.Schedule b4 you leave.

## 2024-11-20 ENCOUNTER — Inpatient Hospital Stay: Admission: RE | Admit: 2024-11-20

## 2024-11-20 ENCOUNTER — Inpatient Hospital Stay
Admission: RE | Admit: 2024-11-20 | Discharge: 2024-11-20 | Attending: Orthopedic Surgery | Admitting: Orthopedic Surgery

## 2024-11-20 DIAGNOSIS — M25561 Pain in right knee: Secondary | ICD-10-CM

## 2024-11-20 MED ADMIN — Iopamidol Inj 41%: 30 mL | INTRA_ARTICULAR | @ 15:00:00 | NDC 00270141111

## 2024-11-25 ENCOUNTER — Ambulatory Visit: Admitting: Family Medicine

## 2024-12-03 ENCOUNTER — Encounter: Payer: Self-pay | Admitting: Cardiology

## 2024-12-03 ENCOUNTER — Ambulatory Visit: Admitting: Cardiology

## 2024-12-03 ENCOUNTER — Telehealth: Payer: Self-pay

## 2024-12-03 VITALS — BP 138/54 | HR 70 | Ht 75.0 in | Wt 267.0 lb

## 2024-12-03 DIAGNOSIS — I480 Paroxysmal atrial fibrillation: Secondary | ICD-10-CM | POA: Diagnosis not present

## 2024-12-03 DIAGNOSIS — Z950 Presence of cardiac pacemaker: Secondary | ICD-10-CM

## 2024-12-03 DIAGNOSIS — I4892 Unspecified atrial flutter: Secondary | ICD-10-CM | POA: Diagnosis not present

## 2024-12-03 DIAGNOSIS — I10 Essential (primary) hypertension: Secondary | ICD-10-CM

## 2024-12-03 DIAGNOSIS — I2510 Atherosclerotic heart disease of native coronary artery without angina pectoris: Secondary | ICD-10-CM | POA: Diagnosis not present

## 2024-12-03 DIAGNOSIS — I495 Sick sinus syndrome: Secondary | ICD-10-CM | POA: Diagnosis not present

## 2024-12-03 LAB — CUP PACEART INCLINIC DEVICE CHECK
Battery Remaining Longevity: 90 mo
Battery Voltage: 3.01 V
Brady Statistic RA Percent Paced: 72 %
Brady Statistic RV Percent Paced: 2.5 %
Date Time Interrogation Session: 20251231140057
Implantable Lead Connection Status: 753985
Implantable Lead Connection Status: 753985
Implantable Lead Implant Date: 20120801
Implantable Lead Implant Date: 20120801
Implantable Lead Location: 753859
Implantable Lead Location: 753860
Implantable Pulse Generator Implant Date: 20230329
Lead Channel Impedance Value: 462.5 Ohm
Lead Channel Impedance Value: 525 Ohm
Lead Channel Pacing Threshold Amplitude: 0.75 V
Lead Channel Pacing Threshold Amplitude: 0.75 V
Lead Channel Pacing Threshold Amplitude: 1.25 V
Lead Channel Pacing Threshold Amplitude: 1.25 V
Lead Channel Pacing Threshold Pulse Width: 0.5 ms
Lead Channel Pacing Threshold Pulse Width: 0.5 ms
Lead Channel Pacing Threshold Pulse Width: 0.5 ms
Lead Channel Pacing Threshold Pulse Width: 0.5 ms
Lead Channel Sensing Intrinsic Amplitude: 4.7 mV
Lead Channel Sensing Intrinsic Amplitude: 9.4 mV
Lead Channel Setting Pacing Amplitude: 2.5 V
Lead Channel Setting Pacing Amplitude: 2.5 V
Lead Channel Setting Pacing Pulse Width: 0.5 ms
Lead Channel Setting Sensing Sensitivity: 2 mV
Pulse Gen Model: 2272
Pulse Gen Serial Number: 8052085

## 2024-12-03 NOTE — Telephone Encounter (Signed)
 Spoke w/ patient regarding concern for remote monitoring. Patient not currently enrolled in remote monitoring. Patient given CPT codes used for billing purposes for remote monitoring every 91 days. Informed patient to call his insurance and provide CPT codes given and inquire how much it would cost patient for remote monitoring.   Informed patient to call device clinic back with any further questions or if he wants to enroll in remote monitoring. Verbalized understanding.   Patient to F/U w/ Artist Pouch, PA on 02/17/2025. Will continue to monitor and update accordingly.

## 2024-12-03 NOTE — Patient Instructions (Addendum)
 Medication Instructions:  NO CHANGES  Lab Work: NONE TO BE DONE TODAY.  Testing/Procedures: NONE  Follow-Up: At [facility], you and your health needs are our priority.  As part of our continuing mission to provide you with exceptional heart care, our providers are all part of one team.  This team includes your primary Cardiologist (physician) and Advanced Practice Providers or APPs (Physician Assistants and Nurse Practitioners) who all work together to provide you with the care you need, when you need it.  Your next appointment:   3 MONTHS  Provider:   ARTIST POUCH, PA

## 2024-12-03 NOTE — Telephone Encounter (Signed)
-----   Message from Artist Pouch sent at 12/03/2024  2:56 PM EST ----- Regarding: Remote monitoring cost I saw this patient in clinic today. Historically has not been enrolled in remote monitoring due to prohibitive cost. Apparently insurance wouldn't cover. Patient was lost to follow up over the last year and in the interim timeframe had some afib. Ideally we would start to monitor remotely. Is there a way to check his insurance to see if they've changed their coverage of remote monitoring?  Thanks!!  Artist Pouch, PA-C

## 2024-12-03 NOTE — Progress Notes (Signed)
 " Electrophysiology Office Note:   ID:  Paul, Paul 1956-11-15, MRN [id number]  Primary Cardiologist: Stanly DELENA Leavens, MD Electrophysiologist: None      History of Present Illness:   Paul Paul is a 68 y.o. male with h/o symptomatic bradycardia s/p PPM, CAD, hyperlipidemia, hypertension, cerebral aneurysm s/p coiling, obesity seen today for routine electrophysiology followup.   Since last being seen in our clinic the patient reports doing well overall. Lower rate limit increased and rate response turned on at last device clinic visit in October 2024 due to exertional fatigue. Patient reports improvement following programming changes. Denies difficulty sleeping, though reports ongoing untreated sleep apnea. States he is planning to self refer for management.  he denies chest pain, palpitations, dyspnea, PND, orthopnea, nausea, vomiting, dizziness, syncope, edema, weight gain, or early satiety.   On review of device interrogation today, patient with multiple episodes of afib/flutter on 11/09/23-11/10/23. Longest episode ~18hrs with peak V rate 162 bpm. Patient is unable to recall if he had symptoms on these days over a year ago, is also unsure if he might have been sick at that time.   Review of systems complete and found to be negative unless listed in HPI.   EP Information / Studies Reviewed:    EKG is ordered today. Personal review as below.  EKG Interpretation Date/Time:  Wednesday December 03 2024 13:03:25 EST Ventricular Rate:  70 PR Interval:  214 QRS Duration:  110 QT Interval:  396 QTC Calculation: 427 R Axis:   -29  Text Interpretation: Atrial-paced rhythm with prolonged AV conduction Moderate voltage criteria for LVH, may be normal variant ( R in aVL , Cornell product ) When compared with ECG of 30-Apr-2023 07:26, PREVIOUS ECG IS PRESENT Confirmed by Trudy Birmingham [phone]) on 12/03/2024 1:06:48 PM    PPM Interrogation-  reviewed in detail today,  See PACEART  report.  Arrhythmia/Device History 07/06/2011 St. Jude Accent DR RF dual-chamber.  02/23/22 Gen change St. Jude Assurity MRI dual chamber.    Physical Exam:   VS:  BP (!) 138/54 (BP Location: Right Arm, Patient Position: Sitting, Cuff Size: Large)   Pulse 70   Ht 6' 3 (1.905 m)   Wt 267 lb (121.1 kg)   BMI 33.37 kg/m    Wt Readings from Last 3 Encounters:  12/03/24 267 lb (121.1 kg)  11/14/24 273 lb 6.4 oz (124 kg)  07/22/24 270 lb 12.8 oz (122.8 kg)     GEN: No acute distress  NECK: No JVD; No carotid bruits CARDIAC: Regular rate and rhythm, no murmurs, rubs, gallops RESPIRATORY:  Clear to auscultation without rales, wheezing or rhonchi  ABDOMEN: Soft, non-tender, non-distended EXTREMITIES:  No edema; No deformity   ASSESSMENT AND PLAN:    SND s/p Abbott PPM  Paroxysmal atrial fibrillation and flutter Secondary hypercoagulable state Normal PPM function. Stable impedance and thresholds. See Elisabeth Art report. No changes today Multiple episodes of afib/flutter on 11/09/23-11/10/23. Longest episode ~18hrs with peak V rate 162 bpm. Patient doesn't recall symptoms. No further episodes since 11/10/23. Discussed stroke risk with CHADS2VASc score 4 and recommended starting Eliquis. Patient confirmed understanding of risk and declines OAC at this time. Encouraged him to call back if he changes his mind. Since he is not enrolled in remote monitoring (cost prohibitive), will arrange close follow up to discuss OAC again and assess for recurrent afib/flutter.  CAD No anginal symptoms. Compliant with ASA and statin. Needs general cardiology follow up.   Hypertension  BP borderline today on Metoprolol  Tartrate 25mg . Management per PCP.  OSA Patient reports untreated OSA. Politely declined referral today, plans to self-refer.      Disposition:   Follow up with EP APP in 3 months  Signed, Artist Pouch, PA-C  "

## 2024-12-07 ENCOUNTER — Ambulatory Visit: Payer: Self-pay | Admitting: Student in an Organized Health Care Education/Training Program

## 2024-12-16 ENCOUNTER — Ambulatory Visit: Payer: Medicare HMO

## 2024-12-22 ENCOUNTER — Ambulatory Visit: Admitting: Orthopedic Surgery

## 2024-12-22 DIAGNOSIS — M25561 Pain in right knee: Secondary | ICD-10-CM

## 2024-12-22 MED ORDER — MELOXICAM 15 MG PO TABS: 15.0000 mg | ORAL_TABLET | Freq: Every day | ORAL | 1 refills | Status: AC

## 2024-12-23 ENCOUNTER — Encounter: Payer: Self-pay | Admitting: Orthopedic Surgery

## 2024-12-23 NOTE — Progress Notes (Signed)
 "  Office Visit Note   Patient: Paul Paul           Date of Birth: Jan 01, 1956           MRN: [id number] Visit Date: 12/22/2024 Requested by: Katrinka Garnette KIDD, MD [address] [place],  [place] [address] PCP: Katrinka Garnette KIDD, MD  Subjective: Chief Complaint  Patient presents with   Other    Follow up for scan review    HPI: EDVIN Paul is a 69 y.o. male who presents to the office reporting episodic and sporadic right knee pain.  Since he was last seen has had a CT arthrogram.  This scan is reviewed with the patient.  Overall he is Mild degenerative changes but nothing actionable with certainty in terms of meniscal pathology.  Ligaments do look good.  Anti-inflammatory helps.  He feels better after exercise.  Leg press actually helps his symptoms.  He is able to go up and down the steps without problems.  No limit with walking endurance..                ROS: All systems reviewed are negative as they relate to the chief complaint within the history of present illness.  Patient denies fevers or chills.  Assessment & Plan: Visit Diagnoses:  1. Acute pain of right knee     Plan: Impression is right knee pain which is episodic.  Does not really look like it is a clear-cut meniscal problem.  Does have mild degenerative changes present.  Recommend continue with the Mobic .  Could consider injection as a next step.  He will follow-up with us  as needed.  Follow-Up Instructions: No follow-ups on file.   Orders:  No orders of the defined types were placed in this encounter.  Meds ordered this encounter  Medications   meloxicam  (MOBIC ) 15 MG tablet    Sig: Take 1 tablet (15 mg total) by mouth daily.    Dispense:  30 tablet    Refill:  1      Procedures: No procedures performed   Clinical Data: No additional findings.  Objective: Vital Signs: There were no vitals taken for this visit.  Physical Exam:  Constitutional: Patient appears well-developed HEENT:  Head:  Normocephalic Eyes:EOM are normal Neck: Normal range of motion Cardiovascular: Normal rate Pulmonary/chest: Effort normal Neurologic: Patient is alert Skin: Skin is warm Psychiatric: Patient has normal mood and affect  Ortho Exam: Ortho exam demonstrates range of motion of 5 degrees to full flexion with no effusion.  Collateral crucial ligaments are stable.  Has a little bit of pain posteriorly.  No Baker's cyst palpable.  CT scan review shows a very small Baker's cyst.  We will refill Mobic  to be taken on an as needed basis.  Specialty Comments:  No specialty comments available.  Imaging: No results found.   PMFS History: Patient Active Problem List   Diagnosis Date Noted   Paroxysmal atrial fibrillation (HCC) 12/03/2024   Atrial flutter (HCC) 12/03/2024   Aortic atherosclerosis 07/28/2020   CAD (coronary artery disease) status post drug-eluting stent mid LAD August 2021 07/28/2020   Sinus node dysfunction (HCC) 08/05/2019   Fatty liver 06/18/2018   Hepatotoxicity due to statin drug 10/19/2017   Elevated LFTs 08/09/2017   Chronic pruritic rash in adult 08/09/2017   GAD (generalized anxiety disorder) 10/11/2016   OSA on CPAP 09/24/2014   Hyperlipidemia associated with type 2 diabetes mellitus (HCC) 09/24/2014   Obesity 01/01/2014   Other  specified cardiac dysrhythmias(427.89) 07/09/2012   Cerebral aneurysm without rupture 01/01/2012   Pacemaker 10/07/2011   Poorly controlled type 2 diabetes mellitus (HCC) 11/09/2008   Essential hypertension 12/10/2007   Past Medical History:  Diagnosis Date   Anxiety    Arthritis    Bell palsy    > 30 years. Left side of face droops a little   Bradycardia    Cerebral aneurysm    Cerebral aneurysm without rupture 06/2011   Middle of Cerebral Artery   Complication of anesthesia 2012   after surgery for cliping of cerebral anerysum- In the room, patient was shaking, and patient would awaken and then go back out shaking continued after  he went back out:   Constipation    Diabetes mellitus    now under control   Dysrhythmia    BRADYCARDIA REQUIRING PACEMAKER .FOLLOWED BY GREGG TAYLOR   HYPERTENSION 12/10/2007   Pacemaker    Shoulder pain    L SHOULDER. RECENT FALL   Sleep apnea    CPAP.NOT WEARING NOW.NEEDS REPLACEMENT PIECE    Family History  Problem Relation Age of Onset   Heart attack Mother        died 57, smoker   Heart disease Mother    Diabetes Mother    Heart attack Father        died 32, smoker   Heart disease Father     Past Surgical History:  Procedure Laterality Date   CARDIAC CATHETERIZATION  07/06/11   normal   CEREBRAL ANEURYSM REPAIR  06/2011   stenting and coiling per patient   CORONARY STENT INTERVENTION N/A 07/21/2020   Procedure: CORONARY STENT INTERVENTION;  Surgeon: Darron Deatrice LABOR, MD;  Location: [facility]ASIVE [facility];  Service: Cardiovascular;  Laterality: N/A;   FRACTURE SURGERY Left    foot   INSERT / REPLACE / REMOVE PACEMAKER  2012   KNEE SURGERY Left    left; arthoscopic   LEFT HEART CATH AND CORONARY ANGIOGRAPHY N/A 07/21/2020   Procedure: LEFT HEART CATH AND CORONARY ANGIOGRAPHY;  Surgeon: Darron Deatrice LABOR, MD;  Location: [facility];  Service: Cardiovascular;  Laterality: N/A;   PPM GENERATOR CHANGEOUT N/A 02/23/2022   Procedure: PPM GENERATOR CHANGEOUT;  Surgeon: Waddell Danelle ORN, MD;  Location: [facility] INVASIVE CV LAB;  Service: Cardiovascular;  Laterality: N/A;   rotator cuff surgery Left 02/2012   septal deviation     SHOULDER ARTHROSCOPY WITH SUBACROMIAL DECOMPRESSION, ROTATOR CUFF REPAIR AND BICEP TENDON REPAIR Right 08/24/2015   Procedure: RIGHT SHOULDER DIAGNOSTIC OPERATIVE ARTHROSCOPY WITH DEBRIDEMENT, SUBACROMIAL DECOMPRESSION, MIMI-OPEN ROTATOR CUFF TEAR REPAIR AND POSSIBLE  BICEPS TENODESIS.  ;  Surgeon: Glendia Cordella Hutchinson, MD;  Location: [facility] OR;  Service: Orthopedics;  Laterality: Right;  RIGHT SHOULDER DIAGNOSTIC OPERATIVE ARTHROSCOPY, DEBRIDEMENT, SUBACROMIAL  DECOMPRESSION, MINI-OPEN ROTATOR CUFF TEAR REPAIR, POSSIB   Social History   Occupational History   Occupation: Disabled  Tobacco Use   Smoking status: Former    Current packs/day: 0.00    Average packs/day: 1 pack/day for 20.0 years (20.0 ttl pk-yrs)    Types: Cigarettes    Start date: 07/05/1991    Quit date: 07/05/2011    Years since quitting: 13.4   Smokeless tobacco: Never  Vaping Use   Vaping status: Never Used  Substance and Sexual Activity   Alcohol use: No    Alcohol/week: 0.0 standard drinks of alcohol   Drug use: No   Sexual activity: Not on file        "

## 2025-01-23 ENCOUNTER — Ambulatory Visit: Admitting: Internal Medicine

## 2025-02-17 ENCOUNTER — Ambulatory Visit: Admitting: Cardiology

## 2025-02-19 ENCOUNTER — Ambulatory Visit: Admitting: Family Medicine
